# Patient Record
Sex: Male | Born: 1945
Health system: Southern US, Community
[De-identification: ages and names within clinical notes are randomized; demographics above are authoritative.]

## PROBLEM LIST (undated history)

## (undated) DIAGNOSIS — Z8673 Personal history of transient ischemic attack (TIA), and cerebral infarction without residual deficits: Secondary | ICD-10-CM

## (undated) DIAGNOSIS — R0609 Other forms of dyspnea: Secondary | ICD-10-CM

## (undated) DIAGNOSIS — I441 Atrioventricular block, second degree: Secondary | ICD-10-CM

## (undated) DIAGNOSIS — I471 Supraventricular tachycardia: Secondary | ICD-10-CM

## (undated) DIAGNOSIS — I219 Acute myocardial infarction, unspecified: Secondary | ICD-10-CM

## (undated) DIAGNOSIS — I10 Essential (primary) hypertension: Secondary | ICD-10-CM

## (undated) DIAGNOSIS — Z9359 Other cystostomy status: Secondary | ICD-10-CM

## (undated) DIAGNOSIS — I739 Peripheral vascular disease, unspecified: Secondary | ICD-10-CM

## (undated) DIAGNOSIS — R030 Elevated blood-pressure reading, without diagnosis of hypertension: Secondary | ICD-10-CM

## (undated) DIAGNOSIS — Z973 Presence of spectacles and contact lenses: Secondary | ICD-10-CM

## (undated) DIAGNOSIS — I4891 Unspecified atrial fibrillation: Secondary | ICD-10-CM

## (undated) DIAGNOSIS — I4719 Other supraventricular tachycardia: Secondary | ICD-10-CM

## (undated) DIAGNOSIS — I639 Cerebral infarction, unspecified: Secondary | ICD-10-CM

## (undated) DIAGNOSIS — J449 Chronic obstructive pulmonary disease, unspecified: Secondary | ICD-10-CM

## (undated) DIAGNOSIS — R0600 Dyspnea, unspecified: Secondary | ICD-10-CM

## (undated) DIAGNOSIS — Z87448 Personal history of other diseases of urinary system: Secondary | ICD-10-CM

## (undated) DIAGNOSIS — Z960 Presence of urogenital implants: Secondary | ICD-10-CM

## (undated) DIAGNOSIS — M1990 Unspecified osteoarthritis, unspecified site: Secondary | ICD-10-CM

## (undated) DIAGNOSIS — N210 Calculus in bladder: Secondary | ICD-10-CM

## (undated) DIAGNOSIS — Z87898 Personal history of other specified conditions: Secondary | ICD-10-CM

## (undated) HISTORY — PX: TONSILLECTOMY: SUR1361

---
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
## 1998-11-05 HISTORY — PX: TOTAL HIP ARTHROPLASTY: SHX124

## 2000-02-19 ENCOUNTER — Encounter: Payer: Self-pay | Admitting: Emergency Medicine

## 2000-02-19 ENCOUNTER — Emergency Department (HOSPITAL_COMMUNITY): Admission: EM | Admit: 2000-02-19 | Discharge: 2000-02-19 | Payer: Self-pay | Admitting: Emergency Medicine

## 2000-08-22 ENCOUNTER — Emergency Department (HOSPITAL_COMMUNITY): Admission: EM | Admit: 2000-08-22 | Discharge: 2000-08-22 | Payer: Self-pay | Admitting: Emergency Medicine

## 2000-08-22 ENCOUNTER — Encounter: Payer: Self-pay | Admitting: Emergency Medicine

## 2001-02-28 ENCOUNTER — Ambulatory Visit (HOSPITAL_COMMUNITY): Admission: RE | Admit: 2001-02-28 | Discharge: 2001-02-28 | Payer: Self-pay | Admitting: Internal Medicine

## 2001-02-28 ENCOUNTER — Encounter: Admission: RE | Admit: 2001-02-28 | Discharge: 2001-02-28 | Payer: Self-pay | Admitting: Internal Medicine

## 2001-02-28 ENCOUNTER — Encounter: Payer: Self-pay | Admitting: Internal Medicine

## 2001-03-06 ENCOUNTER — Encounter: Admission: RE | Admit: 2001-03-06 | Discharge: 2001-03-06 | Payer: Self-pay | Admitting: Internal Medicine

## 2001-10-15 ENCOUNTER — Ambulatory Visit (HOSPITAL_COMMUNITY): Admission: RE | Admit: 2001-10-15 | Discharge: 2001-10-15 | Payer: Self-pay | Admitting: *Deleted

## 2001-10-15 ENCOUNTER — Encounter: Payer: Self-pay | Admitting: *Deleted

## 2001-11-20 ENCOUNTER — Ambulatory Visit (HOSPITAL_COMMUNITY): Admission: RE | Admit: 2001-11-20 | Discharge: 2001-11-20 | Payer: Self-pay | Admitting: *Deleted

## 2001-11-20 ENCOUNTER — Encounter: Payer: Self-pay | Admitting: *Deleted

## 2001-11-24 ENCOUNTER — Encounter: Admission: RE | Admit: 2001-11-24 | Discharge: 2001-11-24 | Payer: Self-pay

## 2001-12-18 ENCOUNTER — Inpatient Hospital Stay (HOSPITAL_COMMUNITY): Admission: RE | Admit: 2001-12-18 | Discharge: 2001-12-24 | Payer: Self-pay | Admitting: *Deleted

## 2002-10-22 ENCOUNTER — Emergency Department (HOSPITAL_COMMUNITY): Admission: EM | Admit: 2002-10-22 | Discharge: 2002-10-22 | Payer: Self-pay | Admitting: Emergency Medicine

## 2002-11-25 ENCOUNTER — Encounter: Payer: Self-pay | Admitting: General Surgery

## 2002-11-25 ENCOUNTER — Ambulatory Visit (HOSPITAL_COMMUNITY): Admission: RE | Admit: 2002-11-25 | Discharge: 2002-11-25 | Payer: Self-pay | Admitting: General Surgery

## 2004-03-26 ENCOUNTER — Emergency Department (HOSPITAL_COMMUNITY): Admission: EM | Admit: 2004-03-26 | Discharge: 2004-03-26 | Payer: Self-pay | Admitting: Family Medicine

## 2005-07-06 ENCOUNTER — Encounter: Admission: RE | Admit: 2005-07-06 | Discharge: 2005-07-06 | Payer: Self-pay | Admitting: Cardiology

## 2005-10-02 ENCOUNTER — Ambulatory Visit (HOSPITAL_BASED_OUTPATIENT_CLINIC_OR_DEPARTMENT_OTHER): Admission: RE | Admit: 2005-10-02 | Discharge: 2005-10-02 | Payer: Self-pay | Admitting: General Surgery

## 2005-10-02 ENCOUNTER — Ambulatory Visit (HOSPITAL_COMMUNITY): Admission: RE | Admit: 2005-10-02 | Discharge: 2005-10-02 | Payer: Self-pay | Admitting: General Surgery

## 2009-11-05 HISTORY — PX: INGUINAL HERNIA REPAIR: SUR1180

## 2012-01-11 ENCOUNTER — Emergency Department (HOSPITAL_COMMUNITY)
Admission: EM | Admit: 2012-01-11 | Discharge: 2012-01-11 | Disposition: A | Discharge status: Home / Self Care | Payer: Medicare Other | Attending: Emergency Medicine | Admitting: Emergency Medicine

## 2012-01-11 ENCOUNTER — Encounter (HOSPITAL_COMMUNITY): Payer: Self-pay | Admitting: Family Medicine

## 2012-01-11 ENCOUNTER — Emergency Department (HOSPITAL_COMMUNITY): Payer: Medicare Other

## 2012-01-11 DIAGNOSIS — I10 Essential (primary) hypertension: Secondary | ICD-10-CM

## 2012-01-11 DIAGNOSIS — IMO0002 Reserved for concepts with insufficient information to code with codable children: Secondary | ICD-10-CM

## 2012-01-11 DIAGNOSIS — R51 Headache: Secondary | ICD-10-CM | POA: Insufficient documentation

## 2012-01-11 DIAGNOSIS — S0180XA Unspecified open wound of other part of head, initial encounter: Secondary | ICD-10-CM | POA: Insufficient documentation

## 2012-01-11 MED ORDER — HYDROCODONE-ACETAMINOPHEN 5-325 MG PO TABS: 2.0000 | ORAL_TABLET | ORAL | Status: AC | PRN

## 2012-01-11 MED ORDER — OXYCODONE-ACETAMINOPHEN 5-325 MG PO TABS
1.0000 | ORAL_TABLET | Freq: Once | ORAL | Status: AC
  Administered 2012-01-11: 1 via ORAL
  Filled 2012-01-11: qty 1
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
## 2012-01-11 NOTE — ED Notes (Signed)
Pt returned from CT °
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
## 2012-01-11 NOTE — ED Notes (Signed)
Dr. Radford Pax at bedside to assess patient.  Laceration to head bleeding significantly and closed with staples. Bleeding controlled after closure. NAD noted. [employer] remain at bedside to question patient.

## 2012-01-11 NOTE — ED Notes (Signed)
Patient transported to CT 

## 2012-01-21 ENCOUNTER — Encounter (HOSPITAL_COMMUNITY): Payer: Self-pay | Admitting: *Deleted

## 2012-01-21 ENCOUNTER — Emergency Department (HOSPITAL_COMMUNITY)
Admission: EM | Admit: 2012-01-21 | Discharge: 2012-01-21 | Disposition: A | Discharge status: Home / Self Care | Payer: Medicare Other | Attending: Emergency Medicine | Admitting: Emergency Medicine

## 2012-01-21 DIAGNOSIS — Z4802 Encounter for removal of sutures: Secondary | ICD-10-CM | POA: Insufficient documentation

## 2012-01-21 NOTE — Discharge Instructions (Signed)
Staple Removal  Care After  The staples used to close your skin have been removed. The wound needs continued care so it can heal completely and without problems. The care described here will need to be done for another 5-10 days unless your caregiver advises otherwise.   HOME CARE INSTRUCTIONS    Keep wound site dry and clean.   If skin adhesive strips were applied after the staples were removed, they will begin to peel off in a few days. If they remain after fourteen days, they may be peeled off and discarded.   If you still have a dressing, change it at least once a day or as instructed by your caregiver. If the bandage sticks, soak it off with warm water. Pat dry with a clean towel. Look for signs of infection (see below).   Reapply cream or ointment according to your caregiver's instruction. This will help prevent infection and keep the bandage from sticking. Use of a non-stick material over the wound and under the dressing or wrap will also help keep the bandage from sticking.   If the bandage becomes wet, dirty or develops a foul smell, change it as soon as possible.   New scars become sunburned easily. Use sunscreens with protection factor (SPF) of at least 15 when out in the sun.   Only take over-the-counter or prescription medicines for pain, discomfort or fever as directed by your caregiver.  SEEK IMMEDIATE MEDICAL CARE IF:    There is redness, swelling or increasing pain in the wound.   Pus is coming from the wound.   An unexplained oral temperature above 102 F (38.9 C) develops.   You notice a foul smell coming from the wound or dressing.   There is a breaking open of the suture line (edges not staying together) of the wound edges after staples have been removed.  Document Released: 10/04/2008 Document Revised: 10/11/2011 Document Reviewed: 10/04/2008  [employer] Patient Information 2012 [employer].

## 2012-01-21 NOTE — ED Notes (Signed)
Pt states he is her to have his staples removed on the right side of his head.
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
## 2012-01-23 NOTE — ED Provider Notes (Signed)
Medical screening examination/treatment/procedure(s) were conducted as a shared visit with non-physician practitioner(s) and myself.  I personally evaluated the patient during the encounter  Toy Baker, MD 01/23/12 [phone]

## 2013-06-18 ENCOUNTER — Encounter (HOSPITAL_COMMUNITY): Payer: Self-pay | Admitting: *Deleted

## 2013-06-18 ENCOUNTER — Emergency Department (HOSPITAL_COMMUNITY)
Admission: EM | Admit: 2013-06-18 | Discharge: 2013-06-18 | Disposition: A | Discharge status: Home / Self Care | Payer: Medicare Other | Attending: Emergency Medicine | Admitting: Emergency Medicine

## 2013-06-18 DIAGNOSIS — F17200 Nicotine dependence, unspecified, uncomplicated: Secondary | ICD-10-CM | POA: Insufficient documentation

## 2013-06-18 DIAGNOSIS — R300 Dysuria: Secondary | ICD-10-CM | POA: Insufficient documentation

## 2013-06-18 DIAGNOSIS — Z8719 Personal history of other diseases of the digestive system: Secondary | ICD-10-CM | POA: Insufficient documentation

## 2013-06-18 DIAGNOSIS — N419 Inflammatory disease of prostate, unspecified: Secondary | ICD-10-CM | POA: Insufficient documentation

## 2013-06-18 DIAGNOSIS — R350 Frequency of micturition: Secondary | ICD-10-CM | POA: Insufficient documentation

## 2013-06-18 LAB — URINE MICROSCOPIC-ADD ON

## 2013-06-18 LAB — URINALYSIS, ROUTINE W REFLEX MICROSCOPIC
Glucose, UA: NEGATIVE mg/dL
Nitrite: POSITIVE — AB
Protein, ur: >300 mg/dL — AB
Urobilinogen, UA: 1 mg/dL (ref 0.0–1.0)

## 2013-06-18 MED ORDER — PHENAZOPYRIDINE HCL 200 MG PO TABS: 200.0000 mg | ORAL_TABLET | Freq: Three times a day (TID) | ORAL | Status: DC

## 2013-06-18 MED ORDER — HYDROCODONE-ACETAMINOPHEN 5-325 MG PO TABS
1.0000 | ORAL_TABLET | ORAL | Status: DC | PRN
Start: 2013-06-18 — End: 2014-03-20

## 2013-06-18 MED ORDER — SULFAMETHOXAZOLE-TRIMETHOPRIM 800-160 MG PO TABS: 1.0000 | ORAL_TABLET | Freq: Two times a day (BID) | ORAL | Status: DC

## 2013-06-18 NOTE — ED Notes (Signed)
To ED for eval of possible UTI. freq urination. Painful urination. Bilateral flank pain. States these symptoms started about 2 months ago, but are intermittent. Skin w/d, resp e/u. Lower abd pain.

## 2013-06-18 NOTE — ED Provider Notes (Signed)
  CSN: [id number]     Arrival date & time 06/18/13  1256 History     First MD Initiated Contact with Patient 06/18/13 1436     Chief Complaint  Patient presents with  . Flank Pain  . Urinary Frequency   (Consider location/radiation/quality/duration/timing/severity/associated sxs/prior Treatment) Patient is a 67 y.o. male presenting with flank pain and frequency. The history is provided by the patient. No language interpreter was used.  Flank Pain This is a new problem. The current episode started more than 1 month ago. Associated symptoms include urinary symptoms. Pertinent negatives include no abdominal pain, fever, nausea or vomiting. Associated symptoms comments: Dysuria with some dripping of urine after he voids on and off for the past 2 months. No fever. He reports the discomfort with urination is worse now. No N, V. No history of prostate problems. .  Urinary Frequency Associated symptoms include urinary symptoms. Pertinent negatives include no abdominal pain, fever, nausea or vomiting.    Past Medical History  Diagnosis Date  . Hernia    Past Surgical History  Procedure Laterality Date  . Joint replacement    . Hernia repair     No family history on file. History  Substance Use Topics  . Smoking status: Current Every Day Smoker -- 0.50 packs/day  . Smokeless tobacco: Not on file  . Alcohol Use: Yes     Comment: 2-3 days a week, wine    Review of Systems  Constitutional: Negative for fever.  Gastrointestinal: Negative for nausea, vomiting and abdominal pain.  Genitourinary: Positive for dysuria, frequency and flank pain. Negative for testicular pain.    Allergies  Review of patient's allergies indicates no known allergies.  Home Medications  No current outpatient prescriptions on file. BP 176/124  Pulse 101  Temp(Src) 97.7 F (36.5 C) (Oral)  Resp 18  SpO2 97% Physical Exam  Constitutional: He is oriented to person, place, and time. He appears well-developed  and well-nourished.  Neck: Normal range of motion.  Pulmonary/Chest: Effort normal.  Abdominal: Soft. He exhibits no distension. There is tenderness.  Suprapubic tenderness without guarding. No bladder distention.  Genitourinary: Penis normal.  No penile discharge from uncircumcised penis. No redness or swelling. Prostate minimally tender, moderately enlarged.  Musculoskeletal: Normal range of motion.  Neurological: He is alert and oriented to person, place, and time.  Skin: Skin is warm and dry.  Psychiatric: He has a normal mood and affect.    ED Course   Procedures (including critical care time)  Labs Reviewed  URINALYSIS, ROUTINE W REFLEX MICROSCOPIC - Abnormal; Notable for the following:    APPearance TURBID (*)    Hgb urine dipstick LARGE (*)    Protein, ur >300 (*)    Nitrite POSITIVE (*)    Leukocytes, UA MODERATE (*)    All other components within normal limits  URINE MICROSCOPIC-ADD ON - Abnormal; Notable for the following:    Bacteria, UA MANY (*)    Crystals TRIPLE PHOSPHATE CRYSTALS (*)    All other components within normal limits  URINE CULTURE   No results found. No diagnosis found. 1. Prostatitis  MDM  Patient is alert and comfortable, oriented and non-toxic. Will treat for prostatitis and refer to urology for follow up.  Arnoldo Hooker, PA-C 06/18/13 1527

## 2013-06-19 LAB — URINE CULTURE
Colony Count: NO GROWTH
Culture: NO GROWTH

## 2013-06-26 NOTE — ED Provider Notes (Signed)
Medical screening examination/treatment/procedure(s) were performed by non-physician practitioner and as supervising physician I was immediately available for consultation/collaboration.  Shaivi Rothschild T Alia Parsley, MD 06/26/13 1121 

## 2014-03-20 ENCOUNTER — Encounter (HOSPITAL_COMMUNITY): Payer: Self-pay | Admitting: Emergency Medicine

## 2014-03-20 ENCOUNTER — Emergency Department (HOSPITAL_COMMUNITY): Payer: Medicare Other

## 2014-03-20 ENCOUNTER — Encounter (HOSPITAL_COMMUNITY): Payer: Medicare Other | Admitting: Anesthesiology

## 2014-03-20 ENCOUNTER — Encounter (HOSPITAL_COMMUNITY)
Admission: EM | Disposition: A | Discharge status: Home Health | Payer: Self-pay | Source: Home / Self Care | Attending: Family Medicine

## 2014-03-20 ENCOUNTER — Inpatient Hospital Stay (HOSPITAL_COMMUNITY)
Admission: EM | Admit: 2014-03-20 | Discharge: 2014-03-22 | DRG: 511 | Disposition: A | Discharge status: Home Health | Payer: Medicare Other | Attending: Family Medicine | Admitting: Family Medicine

## 2014-03-20 ENCOUNTER — Inpatient Hospital Stay (HOSPITAL_COMMUNITY): Payer: Medicare Other | Admitting: Anesthesiology

## 2014-03-20 ENCOUNTER — Inpatient Hospital Stay (HOSPITAL_COMMUNITY): Payer: Medicare Other

## 2014-03-20 DIAGNOSIS — S022XXA Fracture of nasal bones, initial encounter for closed fracture: Secondary | ICD-10-CM

## 2014-03-20 DIAGNOSIS — F10929 Alcohol use, unspecified with intoxication, unspecified: Secondary | ICD-10-CM

## 2014-03-20 DIAGNOSIS — N329 Bladder disorder, unspecified: Secondary | ICD-10-CM | POA: Diagnosis present

## 2014-03-20 DIAGNOSIS — R143 Flatulence: Secondary | ICD-10-CM

## 2014-03-20 DIAGNOSIS — I10 Essential (primary) hypertension: Secondary | ICD-10-CM | POA: Diagnosis present

## 2014-03-20 DIAGNOSIS — N390 Urinary tract infection, site not specified: Secondary | ICD-10-CM | POA: Diagnosis present

## 2014-03-20 DIAGNOSIS — F1020 Alcohol dependence, uncomplicated: Secondary | ICD-10-CM | POA: Diagnosis present

## 2014-03-20 DIAGNOSIS — R142 Eructation: Secondary | ICD-10-CM

## 2014-03-20 DIAGNOSIS — S52609A Unspecified fracture of lower end of unspecified ulna, initial encounter for closed fracture: Secondary | ICD-10-CM

## 2014-03-20 DIAGNOSIS — S62102A Fracture of unspecified carpal bone, left wrist, initial encounter for closed fracture: Secondary | ICD-10-CM

## 2014-03-20 DIAGNOSIS — M1990 Unspecified osteoarthritis, unspecified site: Secondary | ICD-10-CM | POA: Diagnosis present

## 2014-03-20 DIAGNOSIS — S62113A Displaced fracture of triquetrum [cuneiform] bone, unspecified wrist, initial encounter for closed fracture: Secondary | ICD-10-CM | POA: Diagnosis not present

## 2014-03-20 DIAGNOSIS — J4489 Other specified chronic obstructive pulmonary disease: Secondary | ICD-10-CM | POA: Diagnosis present

## 2014-03-20 DIAGNOSIS — F17200 Nicotine dependence, unspecified, uncomplicated: Secondary | ICD-10-CM | POA: Diagnosis present

## 2014-03-20 DIAGNOSIS — S52509A Unspecified fracture of the lower end of unspecified radius, initial encounter for closed fracture: Secondary | ICD-10-CM | POA: Diagnosis present

## 2014-03-20 DIAGNOSIS — J449 Chronic obstructive pulmonary disease, unspecified: Secondary | ICD-10-CM | POA: Diagnosis present

## 2014-03-20 DIAGNOSIS — W19XXXA Unspecified fall, initial encounter: Secondary | ICD-10-CM

## 2014-03-20 DIAGNOSIS — Z23 Encounter for immunization: Secondary | ICD-10-CM

## 2014-03-20 DIAGNOSIS — R141 Gas pain: Secondary | ICD-10-CM | POA: Diagnosis present

## 2014-03-20 DIAGNOSIS — W108XXA Fall (on) (from) other stairs and steps, initial encounter: Secondary | ICD-10-CM | POA: Diagnosis present

## 2014-03-20 DIAGNOSIS — S5292XA Unspecified fracture of left forearm, initial encounter for closed fracture: Secondary | ICD-10-CM | POA: Diagnosis present

## 2014-03-20 DIAGNOSIS — F1010 Alcohol abuse, uncomplicated: Secondary | ICD-10-CM | POA: Diagnosis not present

## 2014-03-20 DIAGNOSIS — F1410 Cocaine abuse, uncomplicated: Secondary | ICD-10-CM | POA: Diagnosis present

## 2014-03-20 DIAGNOSIS — IMO0002 Reserved for concepts with insufficient information to code with codable children: Secondary | ICD-10-CM | POA: Diagnosis present

## 2014-03-20 HISTORY — PX: ORIF WRIST FRACTURE: SHX2133

## 2014-03-20 LAB — URINE MICROSCOPIC-ADD ON

## 2014-03-20 LAB — RAPID URINE DRUG SCREEN, HOSP PERFORMED
Amphetamines: NOT DETECTED
BARBITURATES: NOT DETECTED
Benzodiazepines: NOT DETECTED
COCAINE: POSITIVE — AB
Opiates: NOT DETECTED
Tetrahydrocannabinol: NOT DETECTED

## 2014-03-20 LAB — URINALYSIS, ROUTINE W REFLEX MICROSCOPIC
BILIRUBIN URINE: NEGATIVE
Glucose, UA: NEGATIVE mg/dL
Hgb urine dipstick: NEGATIVE
Ketones, ur: NEGATIVE mg/dL
NITRITE: NEGATIVE
PH: 8 (ref 5.0–8.0)
Protein, ur: 30 mg/dL — AB
SPECIFIC GRAVITY, URINE: 1.012 (ref 1.005–1.030)
Urobilinogen, UA: 1 mg/dL (ref 0.0–1.0)

## 2014-03-20 LAB — CBC WITH DIFFERENTIAL/PLATELET
Basophils Absolute: 0.1 10*3/uL (ref 0.0–0.1)
Basophils Relative: 1 % (ref 0–1)
EOS PCT: 0 % (ref 0–5)
Eosinophils Absolute: 0 10*3/uL (ref 0.0–0.7)
HEMATOCRIT: 38.9 % — AB (ref 39.0–52.0)
HEMOGLOBIN: 13.3 g/dL (ref 13.0–17.0)
LYMPHS ABS: 0.9 10*3/uL (ref 0.7–4.0)
LYMPHS PCT: 9 % — AB (ref 12–46)
MCH: 33.9 pg (ref 26.0–34.0)
MCHC: 34.2 g/dL (ref 30.0–36.0)
MCV: 99.2 fL (ref 78.0–100.0)
MONO ABS: 0.9 10*3/uL (ref 0.1–1.0)
MONOS PCT: 9 % (ref 3–12)
Neutro Abs: 7.6 10*3/uL (ref 1.7–7.7)
Neutrophils Relative %: 81 % — ABNORMAL HIGH (ref 43–77)
PLATELETS: 266 10*3/uL (ref 150–400)
RBC: 3.92 MIL/uL — AB (ref 4.22–5.81)
RDW: 13.6 % (ref 11.5–15.5)
WBC: 9.4 10*3/uL (ref 4.0–10.5)

## 2014-03-20 LAB — COMPREHENSIVE METABOLIC PANEL
ALT: 10 U/L (ref 0–53)
AST: 28 U/L (ref 0–37)
Albumin: 4.3 g/dL (ref 3.5–5.2)
Alkaline Phosphatase: 88 U/L (ref 39–117)
BUN: 6 mg/dL (ref 6–23)
CALCIUM: 9.9 mg/dL (ref 8.4–10.5)
CO2: 20 meq/L (ref 19–32)
Chloride: 92 mEq/L — ABNORMAL LOW (ref 96–112)
Creatinine, Ser: 0.86 mg/dL (ref 0.50–1.35)
GFR, EST NON AFRICAN AMERICAN: 88 mL/min — AB (ref >90)
GLUCOSE: 108 mg/dL — AB (ref 70–99)
Potassium: 3.3 mEq/L — ABNORMAL LOW (ref 3.7–5.3)
SODIUM: 135 meq/L — AB (ref 137–147)
Total Bilirubin: 0.8 mg/dL (ref 0.3–1.2)
Total Protein: 8.6 g/dL — ABNORMAL HIGH (ref 6.0–8.3)

## 2014-03-20 LAB — ETHANOL: Alcohol, Ethyl (B): 136 mg/dL — ABNORMAL HIGH (ref 0–11)

## 2014-03-20 LAB — PROTIME-INR
INR: 0.98 (ref 0.00–1.49)
Prothrombin Time: 12.8 seconds (ref 11.6–15.2)

## 2014-03-20 SURGERY — OPEN REDUCTION INTERNAL FIXATION (ORIF) WRIST FRACTURE
Anesthesia: Monitor Anesthesia Care | Site: Wrist | Laterality: Left

## 2014-03-20 MED ORDER — HYDROMORPHONE HCL PF 2 MG/ML IJ SOLN
2.0000 mg | Freq: Once | INTRAMUSCULAR | Status: AC
  Administered 2014-03-20: 2 mg via INTRAVENOUS

## 2014-03-20 MED ORDER — LIDOCAINE HCL (PF) 2 % IJ SOLN
INTRAMUSCULAR | Status: DC | PRN
  Administered 2014-03-20: 8 mL via PERINEURAL

## 2014-03-20 MED ORDER — LORAZEPAM 2 MG/ML IJ SOLN: 1.0000 mg | Freq: Four times a day (QID) | INTRAMUSCULAR | Status: DC | PRN

## 2014-03-20 MED ORDER — HYDROMORPHONE HCL PF 2 MG/ML IJ SOLN
INTRAMUSCULAR | Status: AC
  Administered 2014-03-20: 2 mg via INTRAVENOUS
  Filled 2014-03-20: qty 1

## 2014-03-20 MED ORDER — LABETALOL HCL 5 MG/ML IV SOLN
20.0000 mg | Freq: Once | INTRAVENOUS | Status: AC
  Administered 2014-03-20: 20 mg via INTRAVENOUS
  Filled 2014-03-20: qty 4

## 2014-03-20 MED ORDER — ACETAMINOPHEN 160 MG/5ML PO SOLN: 325.0000 mg | ORAL | Status: DC | PRN

## 2014-03-20 MED ORDER — ACETAMINOPHEN 325 MG PO TABS: 325.0000 mg | ORAL_TABLET | ORAL | Status: DC | PRN

## 2014-03-20 MED ORDER — ADULT MULTIVITAMIN W/MINERALS CH
1.0000 | ORAL_TABLET | Freq: Every day | ORAL | Status: DC
  Administered 2014-03-20 – 2014-03-22 (×3): 1 via ORAL
  Filled 2014-03-20 (×3): qty 1

## 2014-03-20 MED ORDER — THIAMINE HCL 100 MG/ML IJ SOLN
100.0000 mg | Freq: Every day | INTRAMUSCULAR | Status: DC
  Filled 2014-03-20 (×3): qty 1

## 2014-03-20 MED ORDER — TETANUS-DIPHTH-ACELL PERTUSSIS 5-2.5-18.5 LF-MCG/0.5 IM SUSP
0.5000 mL | Freq: Once | INTRAMUSCULAR | Status: AC
  Administered 2014-03-20: 0.5 mL via INTRAMUSCULAR
  Filled 2014-03-20: qty 0.5

## 2014-03-20 MED ORDER — SODIUM CHLORIDE 0.9 % IV SOLN: INTRAVENOUS | Status: DC

## 2014-03-20 MED ORDER — METHOCARBAMOL 1000 MG/10ML IJ SOLN
500.0000 mg | Freq: Four times a day (QID) | INTRAVENOUS | Status: DC | PRN
  Filled 2014-03-20: qty 5

## 2014-03-20 MED ORDER — VITAMIN B-1 100 MG PO TABS
100.0000 mg | ORAL_TABLET | Freq: Every day | ORAL | Status: DC
  Administered 2014-03-20 – 2014-03-22 (×3): 100 mg via ORAL
  Filled 2014-03-20 (×3): qty 1

## 2014-03-20 MED ORDER — ROPIVACAINE HCL 5 MG/ML IJ SOLN
INTRAMUSCULAR | Status: DC | PRN
  Administered 2014-03-20: 17 mL via PERINEURAL

## 2014-03-20 MED ORDER — SODIUM CHLORIDE 0.9 % IJ SOLN
3.0000 mL | Freq: Two times a day (BID) | INTRAMUSCULAR | Status: DC
  Administered 2014-03-20 – 2014-03-22 (×3): 3 mL via INTRAVENOUS

## 2014-03-20 MED ORDER — IOHEXOL 300 MG/ML  SOLN
100.0000 mL | Freq: Once | INTRAMUSCULAR | Status: AC | PRN
  Administered 2014-03-20: 100 mL via INTRAVENOUS

## 2014-03-20 MED ORDER — FOLIC ACID 1 MG PO TABS
1.0000 mg | ORAL_TABLET | Freq: Every day | ORAL | Status: DC
  Administered 2014-03-20 – 2014-03-22 (×3): 1 mg via ORAL
  Filled 2014-03-20 (×3): qty 1

## 2014-03-20 MED ORDER — HYDRALAZINE HCL 20 MG/ML IJ SOLN
10.0000 mg | Freq: Four times a day (QID) | INTRAMUSCULAR | Status: DC | PRN
  Administered 2014-03-21: 10 mg via INTRAVENOUS
  Filled 2014-03-20: qty 1

## 2014-03-20 MED ORDER — LABETALOL HCL 5 MG/ML IV SOLN: 20.0000 mg | Freq: Once | INTRAVENOUS | Status: DC

## 2014-03-20 MED ORDER — SODIUM CHLORIDE 0.9 % IV SOLN
INTRAVENOUS | Status: DC | PRN
  Administered 2014-03-20: 21:00:00 via INTRAVENOUS

## 2014-03-20 MED ORDER — MORPHINE SULFATE 2 MG/ML IJ SOLN
2.0000 mg | INTRAMUSCULAR | Status: DC | PRN
  Administered 2014-03-21 (×2): 2 mg via INTRAVENOUS
  Filled 2014-03-20 (×2): qty 1

## 2014-03-20 MED ORDER — ONDANSETRON HCL 4 MG PO TABS
4.0000 mg | ORAL_TABLET | Freq: Four times a day (QID) | ORAL | Status: DC | PRN
  Administered 2014-03-20: 4 mg via ORAL

## 2014-03-20 MED ORDER — FENTANYL CITRATE 0.05 MG/ML IJ SOLN: 25.0000 ug | INTRAMUSCULAR | Status: DC | PRN

## 2014-03-20 MED ORDER — HYDROMORPHONE HCL PF 1 MG/ML IJ SOLN
1.0000 mg | Freq: Once | INTRAMUSCULAR | Status: AC
  Administered 2014-03-20: 1 mg via INTRAVENOUS
  Filled 2014-03-20: qty 1

## 2014-03-20 MED ORDER — SODIUM CHLORIDE 0.9 % IV BOLUS (SEPSIS)
1000.0000 mL | Freq: Once | INTRAVENOUS | Status: AC
  Administered 2014-03-20: 1000 mL via INTRAVENOUS

## 2014-03-20 MED ORDER — LORAZEPAM 1 MG PO TABS: 1.0000 mg | ORAL_TABLET | Freq: Four times a day (QID) | ORAL | Status: DC | PRN

## 2014-03-20 MED ORDER — LABETALOL HCL 5 MG/ML IV SOLN
INTRAVENOUS | Status: AC
  Administered 2014-03-20: 5 mg via INTRAVENOUS
  Filled 2014-03-20: qty 4

## 2014-03-20 MED ORDER — ONDANSETRON HCL 4 MG/2ML IJ SOLN
4.0000 mg | Freq: Once | INTRAMUSCULAR | Status: AC
  Administered 2014-03-20: 4 mg via INTRAVENOUS
  Filled 2014-03-20: qty 2

## 2014-03-20 MED ORDER — LABETALOL HCL 5 MG/ML IV SOLN
INTRAVENOUS | Status: DC | PRN
  Administered 2014-03-20 (×4): 10 mg via INTRAVENOUS

## 2014-03-20 MED ORDER — HYDRALAZINE HCL 20 MG/ML IJ SOLN: 10.0000 mg | Freq: Once | INTRAMUSCULAR | Status: DC

## 2014-03-20 MED ORDER — DEXTROSE 5 % IV SOLN
1.0000 g | INTRAVENOUS | Status: DC
  Administered 2014-03-21 – 2014-03-22 (×2): 1 g via INTRAVENOUS
  Filled 2014-03-20 (×2): qty 10

## 2014-03-20 MED ORDER — POLYETHYLENE GLYCOL 3350 17 G PO PACK
17.0000 g | PACK | Freq: Every day | ORAL | Status: DC | PRN
  Filled 2014-03-20: qty 1

## 2014-03-20 MED ORDER — LABETALOL HCL 5 MG/ML IV SOLN
INTRAVENOUS | Status: AC
  Filled 2014-03-20: qty 8

## 2014-03-20 MED ORDER — LABETALOL HCL 5 MG/ML IV SOLN
5.0000 mg | INTRAVENOUS | Status: DC | PRN
  Administered 2014-03-20: 5 mg via INTRAVENOUS

## 2014-03-20 MED ORDER — CEFAZOLIN SODIUM 1-5 GM-% IV SOLN
1.0000 g | INTRAVENOUS | Status: AC
  Administered 2014-03-20: 1 g via INTRAVENOUS

## 2014-03-20 MED ORDER — 0.9 % SODIUM CHLORIDE (POUR BTL) OPTIME
TOPICAL | Status: DC | PRN
  Administered 2014-03-20: 1000 mL

## 2014-03-20 MED ORDER — BACITRACIN ZINC 500 UNIT/GM EX OINT
TOPICAL_OINTMENT | CUTANEOUS | Status: AC
  Filled 2014-03-20: qty 15

## 2014-03-20 MED ORDER — ONDANSETRON HCL 4 MG/2ML IJ SOLN
4.0000 mg | Freq: Four times a day (QID) | INTRAMUSCULAR | Status: DC | PRN
  Filled 2014-03-20: qty 2

## 2014-03-20 MED ORDER — CEFAZOLIN SODIUM-DEXTROSE 2-3 GM-% IV SOLR
INTRAVENOUS | Status: DC | PRN
  Administered 2014-03-20: 2 g via INTRAVENOUS

## 2014-03-20 MED ORDER — ONDANSETRON HCL 4 MG/2ML IJ SOLN: 4.0000 mg | Freq: Once | INTRAMUSCULAR | Status: AC | PRN

## 2014-03-20 MED ORDER — OXYCODONE HCL 5 MG PO TABS: 5.0000 mg | ORAL_TABLET | Freq: Once | ORAL | Status: AC | PRN

## 2014-03-20 MED ORDER — LACTATED RINGERS IV SOLN: INTRAVENOUS | Status: DC

## 2014-03-20 MED ORDER — OXYCODONE HCL 5 MG/5ML PO SOLN: 5.0000 mg | Freq: Once | ORAL | Status: AC | PRN

## 2014-03-20 MED ORDER — LORAZEPAM 2 MG/ML IJ SOLN
1.0000 mg | Freq: Once | INTRAMUSCULAR | Status: DC
  Filled 2014-03-20: qty 1

## 2014-03-20 MED ORDER — SODIUM CHLORIDE 0.9 % IV BOLUS (SEPSIS)
500.0000 mL | Freq: Once | INTRAVENOUS | Status: AC
  Administered 2014-03-20: 500 mL via INTRAVENOUS

## 2014-03-20 MED ORDER — CEFAZOLIN SODIUM 1-5 GM-% IV SOLN
INTRAVENOUS | Status: AC
Start: 2014-03-20 — End: 2014-03-21
  Filled 2014-03-20: qty 50

## 2014-03-20 MED ORDER — DEXTROSE 5 % IV SOLN
1.0000 g | Freq: Once | INTRAVENOUS | Status: AC
  Administered 2014-03-20: 1 g via INTRAVENOUS
  Filled 2014-03-20: qty 10

## 2014-03-20 SURGICAL SUPPLY — 71 items
BANDAGE CONFORM 3  STR LF (GAUZE/BANDAGES/DRESSINGS) ×2 IMPLANT
BANDAGE ELASTIC 4 VELCRO ST LF (GAUZE/BANDAGES/DRESSINGS) ×3 IMPLANT
BLADE 10 SAFETY STRL DISP (BLADE) ×3 IMPLANT
BLADE SURG ROTATE 9660 (MISCELLANEOUS) IMPLANT
BNDG CMPR 9X4 STRL LF SNTH (GAUZE/BANDAGES/DRESSINGS) ×1
BNDG ESMARK 4X9 LF (GAUZE/BANDAGES/DRESSINGS) ×3 IMPLANT
CORDS BIPOLAR (ELECTRODE) ×3 IMPLANT
COVER SURGICAL LIGHT HANDLE (MISCELLANEOUS) ×3 IMPLANT
CUFF TOURNIQUET SINGLE 18IN (TOURNIQUET CUFF) ×3 IMPLANT
CUFF TOURNIQUET SINGLE 24IN (TOURNIQUET CUFF) IMPLANT
DRAIN TLS ROUND 10FR (DRAIN) IMPLANT
DRAPE OEC MINIVIEW 54X84 (DRAPES) IMPLANT
DRAPE SURG 17X23 STRL (DRAPES) ×3 IMPLANT
DRSG ADAPTIC 3X8 NADH LF (GAUZE/BANDAGES/DRESSINGS) ×2 IMPLANT
GAUZE XEROFORM 1X8 LF (GAUZE/BANDAGES/DRESSINGS) ×3 IMPLANT
GAUZE XEROFORM 5X9 LF (GAUZE/BANDAGES/DRESSINGS) ×3 IMPLANT
GLOVE BIOGEL M STRL SZ7.5 (GLOVE) ×3 IMPLANT
GLOVE BIOGEL PI IND STRL 6.5 (GLOVE) IMPLANT
GLOVE BIOGEL PI IND STRL 7.5 (GLOVE) IMPLANT
GLOVE BIOGEL PI INDICATOR 6.5 (GLOVE) ×2
GLOVE BIOGEL PI INDICATOR 7.5 (GLOVE) ×2
GLOVE ECLIPSE 6.5 STRL STRAW (GLOVE) ×4 IMPLANT
GLOVE SS BIOGEL STRL SZ 8 (GLOVE) ×1 IMPLANT
GLOVE SUPERSENSE BIOGEL SZ 8 (GLOVE) ×2
GOWN STRL REUS W/ TWL LRG LVL3 (GOWN DISPOSABLE) ×3 IMPLANT
GOWN STRL REUS W/ TWL XL LVL3 (GOWN DISPOSABLE) ×3 IMPLANT
GOWN STRL REUS W/TWL LRG LVL3 (GOWN DISPOSABLE) ×9
GOWN STRL REUS W/TWL XL LVL3 (GOWN DISPOSABLE) ×9
KIT BASIN OR (CUSTOM PROCEDURE TRAY) ×3 IMPLANT
KIT ROOM TURNOVER OR (KITS) ×3 IMPLANT
LOOP VESSEL MAXI BLUE (MISCELLANEOUS) IMPLANT
MANIFOLD NEPTUNE II (INSTRUMENTS) ×3 IMPLANT
NEEDLE 22X1 1/2 (OR ONLY) (NEEDLE) IMPLANT
NS IRRIG 1000ML POUR BTL (IV SOLUTION) ×3 IMPLANT
PACK ORTHO EXTREMITY (CUSTOM PROCEDURE TRAY) ×3 IMPLANT
PAD ARMBOARD 7.5X6 YLW CONV (MISCELLANEOUS) ×6 IMPLANT
PAD CAST 3X4 CTTN HI CHSV (CAST SUPPLIES) ×1 IMPLANT
PAD CAST 4YDX4 CTTN HI CHSV (CAST SUPPLIES) ×1 IMPLANT
PADDING CAST COTTON 3X4 STRL (CAST SUPPLIES) ×3
PADDING CAST COTTON 4X4 STRL (CAST SUPPLIES) ×3
PEG LOCKING SMOOTH 2.2X18 (Peg) ×4 IMPLANT
PEG LOCKING SMOOTH 2.2X20 (Screw) ×2 IMPLANT
PEG LOCKING SMOOTH 2.2X22 (Screw) ×4 IMPLANT
PEG LOCKING SMOOTH 2.2X24 (Peg) ×3 IMPLANT
PLATE STANDARD DVR LEFT (Plate) ×3 IMPLANT
PLATE STD DVR LT 24X51 (Plate) IMPLANT
PUTTY DBM STAGRAFT PLUS 5CC (Putty) ×2 IMPLANT
SCREW LOCK 14X2.7X 3 LD TPR (Screw) IMPLANT
SCREW LOCK 18X2.7X 3 LD TPR (Screw) IMPLANT
SCREW LOCK 20X2.7X 3 LD TPR (Screw) ×1 IMPLANT
SCREW LOCK 22X2.7X 3 LD TPR (Screw) IMPLANT
SCREW LOCKING 2.7X14 (Screw) ×3 IMPLANT
SCREW LOCKING 2.7X18 (Screw) ×9 IMPLANT
SCREW LOCKING 2.7X20MM (Screw) ×3 IMPLANT
SCREW LOCKING 2.7X22MM (Screw) ×3 IMPLANT
SPONGE GAUZE 4X4 12PLY (GAUZE/BANDAGES/DRESSINGS) ×3 IMPLANT
SPONGE LAP 18X18 X RAY DECT (DISPOSABLE) ×2 IMPLANT
SPONGE LAP 4X18 X RAY DECT (DISPOSABLE) IMPLANT
SUT MNCRL AB 4-0 PS2 18 (SUTURE) ×3 IMPLANT
SUT PROLENE 3 0 PS 2 (SUTURE) ×3 IMPLANT
SUT PROLENE 4 0 P 3 18 (SUTURE) ×2 IMPLANT
SUT PROLENE 4 0 PS 2 18 (SUTURE) ×9 IMPLANT
SUT VIC AB 3-0 FS2 27 (SUTURE) ×3 IMPLANT
SYR CONTROL 10ML LL (SYRINGE) IMPLANT
SYSTEM CHEST DRAIN TLS 7FR (DRAIN) ×2 IMPLANT
TOWEL OR 17X24 6PK STRL BLUE (TOWEL DISPOSABLE) ×3 IMPLANT
TOWEL OR 17X26 10 PK STRL BLUE (TOWEL DISPOSABLE) ×3 IMPLANT
TUBE CONNECTING 12'X1/4 (SUCTIONS) ×1
TUBE CONNECTING 12X1/4 (SUCTIONS) ×2 IMPLANT
TUBE EVACUATION TLS (MISCELLANEOUS) ×3 IMPLANT
WATER STERILE IRR 1000ML POUR (IV SOLUTION) ×3 IMPLANT

## 2014-03-20 NOTE — Anesthesia Preprocedure Evaluation (Signed)
Anesthesia Evaluation  Patient identified by MRN, date of birth, ID band Patient awake    Reviewed: Allergy & Precautions, H&P , NPO status , Patient's Chart, lab work & pertinent test results  History of Anesthesia Complications Negative for: history of anesthetic complications  Airway Mallampati: II TM Distance: >3 FB Neck ROM: Full    Dental  (+) Poor Dentition   Pulmonary neg sleep apnea, neg COPDneg recent URI, Current Smoker,  breath sounds clear to auscultation        Cardiovascular hypertension, Rhythm:Regular     Neuro/Psych negative neurological ROS  negative psych ROS   GI/Hepatic negative GI ROS, Neg liver ROS,   Endo/Other  negative endocrine ROS  Renal/GU negative Renal ROS     Musculoskeletal   Abdominal   Peds  Hematology negative hematology ROS (+)   Anesthesia Other Findings positve ETOH and cocaine intoxication  Reproductive/Obstetrics                           Anesthesia Physical Anesthesia Plan  ASA: III  Anesthesia Plan: MAC and Regional   Post-op Pain Management:    Induction:   Airway Management Planned: Natural Airway and Simple Face Mask  Additional Equipment: None  Intra-op Plan:   Post-operative Plan:   Informed Consent: I have reviewed the patients History and Physical, chart, labs and discussed the procedure including the risks, benefits and alternatives for the proposed anesthesia with the patient or authorized representative who has indicated his/her understanding and acceptance.   Dental advisory given  Plan Discussed with: CRNA and Surgeon  Anesthesia Plan Comments:         Anesthesia Quick Evaluation
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
## 2014-03-20 NOTE — Transfer of Care (Signed)
Immediate Anesthesia Transfer of Care Note  Patient: Joe Durham  Procedure(s) Performed: Procedure(s): OPEN REDUCTION INTERNAL FIXATION (ORIF) WRIST FRACTURE (Left)  Patient Location: PACU  Anesthesia Type:MAC and Regional  Level of Consciousness: awake, oriented and patient cooperative  Airway & Oxygen Therapy: Patient Spontanous Breathing  Post-op Assessment: Report given to PACU RN, Post -op Vital signs reviewed and stable and Patient moving all extremities X 4  Post vital signs: Reviewed and stable  Complications: No apparent anesthesia complications

## 2014-03-20 NOTE — ED Notes (Signed)
[facility] has been contacted by unit secretary approx ago. [facility] sts pt is on the list for transport but there are no trucks available at this time.
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
## 2014-03-20 NOTE — ED Provider Notes (Signed)
Medical screening exam  Patient transferred from the ER at [facility] long for management of comminuted fracture left wrist by Doctor Amanda Pea, hand surgery. Reviewing the patient's records reveals that he has a history of hypertension, which is very uncontrolled today. He has COPD as well. This does not appear to be active, oxygenation is 100% upon arrival, lungs are clear. Patient has a history of chronic alcoholism and is at risk for withdrawal. Based on his multiple medical problems, it was felt that the patient will require admission to the medicine service rather than the hand surgeon's service. Case was discussed with family practice, on call for unassigned. Patient will be admitted to telemetry. He was given an additional dose of labetalol here after my evaluation for elevated blood pressures.  Physical Exam  Constitutional: He appears well-developed and well-nourished.  Eyes: EOM are normal. Pupils are equal, round, and reactive to light.  Neck: Normal range of motion.  Cardiovascular: Normal rate and regular rhythm.   Pulmonary/Chest: Effort normal and breath sounds normal.  Abdominal: Soft. There is no tenderness.  Musculoskeletal:  Obvious deformity left wrist, normal capillary refill, sensation distal to the fracture  Neurological:  Sleeping, easily arouses. Images questions appropriately and follows commands  Skin: Skin is warm and dry.  Sutures in place left cheek    Gilda Crease, MD 03/20/14 [phone]
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
## 2014-03-20 NOTE — ED Notes (Signed)
Bed: WA06 Expected date:  Expected time:  Means of arrival:  Comments: EMS fall/wrist fx

## 2014-03-20 NOTE — Anesthesia Procedure Notes (Addendum)
Procedure Name: MAC Date/Time: 03/20/2014 9:25 PM Performed by: Melina Schools Pre-anesthesia Checklist: Patient identified, Emergency Drugs available, Suction available and Patient being monitored Patient Re-evaluated:Patient Re-evaluated prior to inductionOxygen Delivery Method: Simple face mask    Anesthesia Regional Block:  Supraclavicular block  Pre-Anesthetic Checklist: ,, timeout performed, Correct Patient, Correct Site, Correct Laterality, Correct Procedure, Correct Position, site marked, Risks and benefits discussed,  Surgical consent,  Pre-op evaluation,  At surgeon's request and post-op pain management  Laterality: Upper and Left  Prep: chloraprep       Needles:  Injection technique: Single-shot  Needle Type: Echogenic Needle          Additional Needles:  Procedures: ultrasound guided (picture in chart) Supraclavicular block Narrative:  Start time: 03/20/2014 9:00 PM End time: 03/20/2014 9:07 PM Injection made incrementally with aspirations every 5 mL.  Performed by: Personally  Anesthesiologist: Willetta York  Additional Notes: H+P and labs reviewed, risks and benefits discussed with patient, procedure tolerated well without complications

## 2014-03-20 NOTE — ED Notes (Signed)
Pt d/c with [facility] to [facility] ED. Labetolol given prior to d/c at [facility] request. Ativan for nausea/anxiety held at [facility] request, reporting "pt is not very arouseable, can we not give the ativan?" Pt is A&O x4, GCS 15. Ativan still held at request.

## 2014-03-20 NOTE — Op Note (Signed)
See dictation [phone] Kimber Esterly Md

## 2014-03-20 NOTE — ED Notes (Signed)
OR calling for pt as [facility] transferring pt from stretcher to ED stretcher.

## 2014-03-20 NOTE — ED Notes (Addendum)
O2 sat dropped to 89% after dilaudid administration. 2L O2 by [place] placed on pt. Sat up to 99%

## 2014-03-21 DIAGNOSIS — S62109A Fracture of unspecified carpal bone, unspecified wrist, initial encounter for closed fracture: Secondary | ICD-10-CM

## 2014-03-21 DIAGNOSIS — Z23 Encounter for immunization: Secondary | ICD-10-CM | POA: Diagnosis not present

## 2014-03-21 DIAGNOSIS — F1010 Alcohol abuse, uncomplicated: Secondary | ICD-10-CM

## 2014-03-21 DIAGNOSIS — N390 Urinary tract infection, site not specified: Secondary | ICD-10-CM | POA: Diagnosis present

## 2014-03-21 DIAGNOSIS — S62113A Displaced fracture of triquetrum [cuneiform] bone, unspecified wrist, initial encounter for closed fracture: Secondary | ICD-10-CM | POA: Diagnosis present

## 2014-03-21 DIAGNOSIS — N329 Bladder disorder, unspecified: Secondary | ICD-10-CM | POA: Diagnosis present

## 2014-03-21 DIAGNOSIS — R141 Gas pain: Secondary | ICD-10-CM | POA: Diagnosis present

## 2014-03-21 DIAGNOSIS — IMO0002 Reserved for concepts with insufficient information to code with codable children: Secondary | ICD-10-CM | POA: Diagnosis present

## 2014-03-21 DIAGNOSIS — J449 Chronic obstructive pulmonary disease, unspecified: Secondary | ICD-10-CM | POA: Diagnosis present

## 2014-03-21 DIAGNOSIS — I10 Essential (primary) hypertension: Secondary | ICD-10-CM

## 2014-03-21 DIAGNOSIS — S5292XA Unspecified fracture of left forearm, initial encounter for closed fracture: Secondary | ICD-10-CM | POA: Diagnosis present

## 2014-03-21 DIAGNOSIS — M1990 Unspecified osteoarthritis, unspecified site: Secondary | ICD-10-CM | POA: Diagnosis present

## 2014-03-21 DIAGNOSIS — S022XXA Fracture of nasal bones, initial encounter for closed fracture: Secondary | ICD-10-CM

## 2014-03-21 DIAGNOSIS — F17200 Nicotine dependence, unspecified, uncomplicated: Secondary | ICD-10-CM | POA: Diagnosis present

## 2014-03-21 DIAGNOSIS — F1020 Alcohol dependence, uncomplicated: Secondary | ICD-10-CM | POA: Diagnosis present

## 2014-03-21 DIAGNOSIS — F1410 Cocaine abuse, uncomplicated: Secondary | ICD-10-CM | POA: Diagnosis present

## 2014-03-21 DIAGNOSIS — W108XXA Fall (on) (from) other stairs and steps, initial encounter: Secondary | ICD-10-CM | POA: Diagnosis present

## 2014-03-21 DIAGNOSIS — S52509A Unspecified fracture of the lower end of unspecified radius, initial encounter for closed fracture: Secondary | ICD-10-CM | POA: Diagnosis present

## 2014-03-21 LAB — CBC
HCT: 32.5 % — ABNORMAL LOW (ref 39.0–52.0)
Hemoglobin: 11.1 g/dL — ABNORMAL LOW (ref 13.0–17.0)
MCH: 34.6 pg — ABNORMAL HIGH (ref 26.0–34.0)
MCHC: 34.2 g/dL (ref 30.0–36.0)
MCV: 101.2 fL — AB (ref 78.0–100.0)
PLATELETS: 246 10*3/uL (ref 150–400)
RBC: 3.21 MIL/uL — ABNORMAL LOW (ref 4.22–5.81)
RDW: 13.8 % (ref 11.5–15.5)
WBC: 6.5 10*3/uL (ref 4.0–10.5)

## 2014-03-21 LAB — BASIC METABOLIC PANEL
BUN: 8 mg/dL (ref 6–23)
CALCIUM: 9 mg/dL (ref 8.4–10.5)
CO2: 25 meq/L (ref 19–32)
CREATININE: 0.96 mg/dL (ref 0.50–1.35)
Chloride: 99 mEq/L (ref 96–112)
GFR calc Af Amer: >90 mL/min (ref >90)
GFR, EST NON AFRICAN AMERICAN: 84 mL/min — AB (ref >90)
Glucose, Bld: 103 mg/dL — ABNORMAL HIGH (ref 70–99)
Potassium: 4.7 mEq/L (ref 3.7–5.3)
Sodium: 136 mEq/L — ABNORMAL LOW (ref 137–147)

## 2014-03-21 MED ORDER — POTASSIUM CHLORIDE 10 MEQ/100ML IV SOLN
10.0000 meq | INTRAVENOUS | Status: DC
  Filled 2014-03-21 (×5): qty 100

## 2014-03-21 MED ORDER — CEFAZOLIN SODIUM 1-5 GM-% IV SOLN
1.0000 g | Freq: Three times a day (TID) | INTRAVENOUS | Status: DC
  Administered 2014-03-21 – 2014-03-22 (×5): 1 g via INTRAVENOUS
  Filled 2014-03-21 (×7): qty 50

## 2014-03-21 MED ORDER — HYDROMORPHONE HCL PF 1 MG/ML IJ SOLN
0.5000 mg | INTRAMUSCULAR | Status: DC | PRN
  Administered 2014-03-22: 0.5 mg via INTRAVENOUS

## 2014-03-21 MED ORDER — HYDROMORPHONE HCL PF 1 MG/ML IJ SOLN: 1.0000 mg | INTRAMUSCULAR | Status: DC | PRN

## 2014-03-21 MED ORDER — AMLODIPINE BESYLATE 10 MG PO TABS
10.0000 mg | ORAL_TABLET | Freq: Every day | ORAL | Status: DC
  Administered 2014-03-21 – 2014-03-22 (×2): 10 mg via ORAL
  Filled 2014-03-21 (×2): qty 1

## 2014-03-21 MED ORDER — HYDROMORPHONE HCL PF 1 MG/ML IJ SOLN
1.0000 mg | INTRAMUSCULAR | Status: DC | PRN
  Administered 2014-03-21 (×3): 1 mg via INTRAVENOUS
  Filled 2014-03-21 (×4): qty 1

## 2014-03-21 MED ORDER — POTASSIUM CHLORIDE CRYS ER 20 MEQ PO TBCR
40.0000 meq | EXTENDED_RELEASE_TABLET | Freq: Once | ORAL | Status: AC
  Administered 2014-03-21: 40 meq via ORAL
  Filled 2014-03-21: qty 2

## 2014-03-21 MED ORDER — HEPARIN SODIUM (PORCINE) 5000 UNIT/ML IJ SOLN
5000.0000 [IU] | Freq: Three times a day (TID) | INTRAMUSCULAR | Status: DC
  Administered 2014-03-21 – 2014-03-22 (×2): 5000 [IU] via SUBCUTANEOUS
  Filled 2014-03-21 (×5): qty 1

## 2014-03-21 MED ORDER — METHOCARBAMOL 500 MG PO TABS
500.0000 mg | ORAL_TABLET | Freq: Four times a day (QID) | ORAL | Status: DC | PRN
  Administered 2014-03-21: 500 mg via ORAL
  Filled 2014-03-21: qty 1

## 2014-03-21 NOTE — Progress Notes (Signed)
Utilization Review Completed.  
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
## 2014-03-21 NOTE — Progress Notes (Signed)
Family Practice Teaching Service Interval Progress Note  Returned to pt's room after his surgery to check on him. He was sleeping comfortably. Examined his eyes and nose in greater detail. PERRL. Nasal septum without any obvious hematoma. Pt states nose feels mildly swollen but not severely. Will continue to monitor for signs of hematoma.  Levert Feinstein, MD Family Medicine PGY-2 Service Pager [phone]

## 2014-03-21 NOTE — Progress Notes (Signed)
FMTS Attending Daily Note:  Joe Don MD  [phone] pager  Family Practice pager:  [phone] I have seen and examined this patient and have reviewed their chart. I have discussed this patient with the resident. I agree with the resident's findings, assessment and care plan.  Additionally:  See separate admit note  Tobey Grim, MD 03/21/2014 12:00 PM
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
## 2014-03-21 NOTE — Anesthesia Postprocedure Evaluation (Signed)
  Anesthesia Post-op Note  Patient: Joe Durham  Procedure(s) Performed: Procedure(s): OPEN REDUCTION INTERNAL FIXATION (ORIF) WRIST FRACTURE (Left)  Patient Location: PACU  Anesthesia Type:MAC and Regional  Level of Consciousness: awake and alert   Airway and Oxygen Therapy: Patient Spontanous Breathing  Post-op Pain: none  Post-op Assessment: Post-op Vital signs reviewed, Patient's Cardiovascular Status Stable, Respiratory Function Stable, Patent Airway, No signs of Nausea or vomiting and Pain level controlled  Post-op Vital Signs: Reviewed and stable  Last Vitals:  Filed Vitals:   03/21/14 0500  BP: 196/91  Pulse: 86  Temp: 36.7 C  Resp: 18    Complications: No apparent anesthesia complications

## 2014-03-21 NOTE — Progress Notes (Signed)
Patient ID: Joe Durham, male   DOB: [birth year], 68 y.o.   MRN: [id number] Patient is stable  Drain is removed.  Patient has normal sensation to light touch and good early range of motion. We've discussed with him elevation range of motion and massage to the fingers. We would recommend he continue to be very careful and cautious and nonweightbearing to the affected left upper extremity  He is stable from our standpoint  He could be DC'd with return to my office in 12-14 days for followup  We discussed this with him at length.  

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
## 2014-03-22 MED ORDER — ACETAMINOPHEN 325 MG PO TABS: 650.0000 mg | ORAL_TABLET | Freq: Four times a day (QID) | ORAL | Status: DC | PRN

## 2014-03-22 MED ORDER — OXYCODONE HCL 5 MG PO TABS: 5.0000 mg | ORAL_TABLET | ORAL | Status: DC | PRN

## 2014-03-22 MED ORDER — POLYETHYLENE GLYCOL 3350 17 G PO PACK: 17.0000 g | PACK | Freq: Every day | ORAL | Status: DC | PRN

## 2014-03-22 MED ORDER — LISINOPRIL 10 MG PO TABS
10.0000 mg | ORAL_TABLET | Freq: Every day | ORAL | Status: DC
  Administered 2014-03-22: 10 mg via ORAL
  Filled 2014-03-22 (×2): qty 1

## 2014-03-22 MED ORDER — OXYCODONE HCL 5 MG PO TABS: 10.0000 mg | ORAL_TABLET | ORAL | Status: DC | PRN

## 2014-03-22 MED ORDER — AMLODIPINE BESYLATE 10 MG PO TABS: 10.0000 mg | ORAL_TABLET | Freq: Every day | ORAL | Status: DC
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
## 2014-03-22 NOTE — Progress Notes (Signed)
FMTS Attending Note Patient seen and examined by me, discussed with resident team and I agree with Dr Nyra Capes assessment and plan as per her note. Paula Compton, MD

## 2014-03-23 ENCOUNTER — Encounter (HOSPITAL_COMMUNITY): Payer: Self-pay | Admitting: Orthopedic Surgery

## 2014-03-23 LAB — URINE CULTURE
Colony Count: >=100000
Special Requests: NORMAL

## 2014-04-02 ENCOUNTER — Other Ambulatory Visit: Payer: Self-pay | Admitting: Urology

## 2014-04-05 ENCOUNTER — Ambulatory Visit: Payer: Medicare Other | Admitting: Family Medicine

## 2014-04-12 ENCOUNTER — Encounter: Payer: Self-pay | Admitting: Family Medicine

## 2014-04-12 ENCOUNTER — Ambulatory Visit (INDEPENDENT_AMBULATORY_CARE_PROVIDER_SITE_OTHER): Payer: Medicare Other | Admitting: Family Medicine

## 2014-04-12 VITALS — BP 137/90 | HR 101 | Temp 98.3°F | Wt 125.0 lb

## 2014-04-12 DIAGNOSIS — S52599A Other fractures of lower end of unspecified radius, initial encounter for closed fracture: Secondary | ICD-10-CM

## 2014-04-12 DIAGNOSIS — I10 Essential (primary) hypertension: Secondary | ICD-10-CM

## 2014-04-12 DIAGNOSIS — Z720 Tobacco use: Secondary | ICD-10-CM

## 2014-04-12 DIAGNOSIS — F17200 Nicotine dependence, unspecified, uncomplicated: Secondary | ICD-10-CM

## 2014-04-12 DIAGNOSIS — M25531 Pain in right wrist: Secondary | ICD-10-CM

## 2014-04-12 DIAGNOSIS — S52513A Displaced fracture of unspecified radial styloid process, initial encounter for closed fracture: Secondary | ICD-10-CM

## 2014-04-12 DIAGNOSIS — N210 Calculus in bladder: Secondary | ICD-10-CM

## 2014-04-12 DIAGNOSIS — M25539 Pain in unspecified wrist: Secondary | ICD-10-CM

## 2014-04-12 MED ORDER — AMLODIPINE BESYLATE 10 MG PO TABS: 10.0000 mg | ORAL_TABLET | Freq: Every day | ORAL | Status: DC

## 2014-04-12 MED ORDER — NICOTINE 14 MG/24HR TD PT24: 14.0000 mg | MEDICATED_PATCH | Freq: Every day | TRANSDERMAL | Status: DC

## 2014-04-12 NOTE — Assessment & Plan Note (Signed)
A: at goal P: continue current regimen 

## 2014-04-12 NOTE — Assessment & Plan Note (Addendum)
A: Chronic interested in trying nicotine patch  P: goal for 1 pack per 2 weeks by 1 month Nicotine 14mg  Cont cessation counseling
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
## 2014-04-12 NOTE — Patient Instructions (Addendum)
Mr Economides it was great to see you today!  I am pleased to hear that things are going well for you. Great job with your blood pressure Please continue taking those pills.   Please obtain an Xray of your wrist  Continue with Ibuprofen and ice as needed for discomfort  For your smoking Plan to see me back in 1 month.  Goal for that time is 1 pack per 2 weeks Place the patch daily  Please follow up with urology as scheduled  Looking forward to seeing you soon Charlane Ferretti, MD

## 2014-04-12 NOTE — Assessment & Plan Note (Signed)
A: questionable bladder mass on CT during admission P: f/up with urology for cysto and stone removal  Date appears to be June 19th

## 2014-04-14 ENCOUNTER — Encounter (HOSPITAL_BASED_OUTPATIENT_CLINIC_OR_DEPARTMENT_OTHER): Payer: Self-pay | Admitting: *Deleted

## 2014-04-14 ENCOUNTER — Ambulatory Visit (HOSPITAL_COMMUNITY)
Admission: RE | Admit: 2014-04-14 | Discharge: 2014-04-14 | Disposition: A | Discharge status: Home / Self Care | Payer: Medicare Other | Source: Ambulatory Visit | Attending: Family Medicine | Admitting: Family Medicine

## 2014-04-14 DIAGNOSIS — M25539 Pain in unspecified wrist: Secondary | ICD-10-CM | POA: Insufficient documentation

## 2014-04-14 DIAGNOSIS — S52513A Displaced fracture of unspecified radial styloid process, initial encounter for closed fracture: Secondary | ICD-10-CM

## 2014-04-15 ENCOUNTER — Telehealth: Payer: Self-pay | Admitting: Family Medicine

## 2014-04-15 ENCOUNTER — Encounter (HOSPITAL_BASED_OUTPATIENT_CLINIC_OR_DEPARTMENT_OTHER): Payer: Self-pay | Admitting: *Deleted

## 2014-04-15 NOTE — Telephone Encounter (Signed)
[facility] called and needs a couple of things done. One is for the doctor to look at the x-rays and decided if the pt needs to be seen or have other x-rays done. They would also like the x-rays faxed or sent to the pt's ortho doctor Dr. Amanda Pea office number is [phone]. [name]

## 2014-04-15 NOTE — Progress Notes (Signed)
NPO AFTER MN . ARRIVE AT 1000.  NEEDS ISTAT.  CURRENT EKG IN CHART AND EPIC. WILL TAKE NORVASC AM DOS W/ SIPS OF WATER.

## 2014-04-15 NOTE — Telephone Encounter (Signed)
Spoke with Dr. Brunetta Genera office.  Barbara from [facility] also called Dr. Brunetta Genera office and they have sent a message to Dr. Amanda Pea for him to review.  I faxed xray results to [phone]. [place], Maryjo [place]

## 2014-04-23 ENCOUNTER — Ambulatory Visit (HOSPITAL_BASED_OUTPATIENT_CLINIC_OR_DEPARTMENT_OTHER): Admission: RE | Admit: 2014-04-23 | Payer: Medicare Other | Source: Ambulatory Visit | Admitting: Urology

## 2014-04-23 HISTORY — DX: Presence of spectacles and contact lenses: Z97.3

## 2014-04-23 HISTORY — DX: Calculus in bladder: N21.0

## 2014-04-23 HISTORY — DX: Essential (primary) hypertension: I10

## 2014-04-23 HISTORY — DX: Chronic obstructive pulmonary disease, unspecified: J44.9

## 2014-04-23 SURGERY — CYSTOSCOPY
Anesthesia: General

## 2014-11-05 DIAGNOSIS — I639 Cerebral infarction, unspecified: Secondary | ICD-10-CM

## 2014-11-05 HISTORY — DX: Cerebral infarction, unspecified: I63.9

## 2014-12-06 DIAGNOSIS — Z8673 Personal history of transient ischemic attack (TIA), and cerebral infarction without residual deficits: Secondary | ICD-10-CM

## 2014-12-06 HISTORY — DX: Personal history of transient ischemic attack (TIA), and cerebral infarction without residual deficits: Z86.73

## 2015-01-03 ENCOUNTER — Encounter (HOSPITAL_COMMUNITY): Payer: Self-pay

## 2015-01-03 ENCOUNTER — Emergency Department (HOSPITAL_COMMUNITY): Payer: Medicare Other

## 2015-01-03 ENCOUNTER — Inpatient Hospital Stay (HOSPITAL_COMMUNITY)
Admission: EM | Admit: 2015-01-03 | Discharge: 2015-01-06 | DRG: 066 | Disposition: A | Discharge status: Home Health | Payer: Medicare Other | Attending: Family Medicine | Admitting: Family Medicine

## 2015-01-03 DIAGNOSIS — I159 Secondary hypertension, unspecified: Secondary | ICD-10-CM

## 2015-01-03 DIAGNOSIS — R269 Unspecified abnormalities of gait and mobility: Secondary | ICD-10-CM | POA: Diagnosis present

## 2015-01-03 DIAGNOSIS — H53462 Homonymous bilateral field defects, left side: Secondary | ICD-10-CM | POA: Diagnosis present

## 2015-01-03 DIAGNOSIS — F17210 Nicotine dependence, cigarettes, uncomplicated: Secondary | ICD-10-CM | POA: Diagnosis present

## 2015-01-03 DIAGNOSIS — I10 Essential (primary) hypertension: Secondary | ICD-10-CM | POA: Diagnosis present

## 2015-01-03 DIAGNOSIS — Z9119 Patient's noncompliance with other medical treatment and regimen: Secondary | ICD-10-CM | POA: Diagnosis present

## 2015-01-03 DIAGNOSIS — N2889 Other specified disorders of kidney and ureter: Secondary | ICD-10-CM | POA: Diagnosis not present

## 2015-01-03 DIAGNOSIS — S52509A Unspecified fracture of the lower end of unspecified radius, initial encounter for closed fracture: Secondary | ICD-10-CM | POA: Diagnosis present

## 2015-01-03 DIAGNOSIS — R918 Other nonspecific abnormal finding of lung field: Secondary | ICD-10-CM

## 2015-01-03 DIAGNOSIS — I129 Hypertensive chronic kidney disease with stage 1 through stage 4 chronic kidney disease, or unspecified chronic kidney disease: Secondary | ICD-10-CM | POA: Diagnosis present

## 2015-01-03 DIAGNOSIS — I63531 Cerebral infarction due to unspecified occlusion or stenosis of right posterior cerebral artery: Secondary | ICD-10-CM | POA: Diagnosis present

## 2015-01-03 DIAGNOSIS — J449 Chronic obstructive pulmonary disease, unspecified: Secondary | ICD-10-CM | POA: Diagnosis present

## 2015-01-03 DIAGNOSIS — F1010 Alcohol abuse, uncomplicated: Secondary | ICD-10-CM | POA: Diagnosis present

## 2015-01-03 DIAGNOSIS — Z7982 Long term (current) use of aspirin: Secondary | ICD-10-CM | POA: Diagnosis not present

## 2015-01-03 DIAGNOSIS — Z720 Tobacco use: Secondary | ICD-10-CM | POA: Diagnosis present

## 2015-01-03 DIAGNOSIS — H539 Unspecified visual disturbance: Secondary | ICD-10-CM | POA: Diagnosis present

## 2015-01-03 DIAGNOSIS — E785 Hyperlipidemia, unspecified: Secondary | ICD-10-CM | POA: Diagnosis present

## 2015-01-03 DIAGNOSIS — N182 Chronic kidney disease, stage 2 (mild): Secondary | ICD-10-CM | POA: Diagnosis present

## 2015-01-03 DIAGNOSIS — T508X5A Adverse effect of diagnostic agents, initial encounter: Secondary | ICD-10-CM | POA: Diagnosis not present

## 2015-01-03 DIAGNOSIS — I639 Cerebral infarction, unspecified: Secondary | ICD-10-CM

## 2015-01-03 DIAGNOSIS — R51 Headache: Secondary | ICD-10-CM

## 2015-01-03 DIAGNOSIS — I63431 Cerebral infarction due to embolism of right posterior cerebral artery: Secondary | ICD-10-CM

## 2015-01-03 DIAGNOSIS — Z8673 Personal history of transient ischemic attack (TIA), and cerebral infarction without residual deficits: Secondary | ICD-10-CM | POA: Diagnosis present

## 2015-01-03 DIAGNOSIS — R519 Headache, unspecified: Secondary | ICD-10-CM

## 2015-01-03 DIAGNOSIS — S52609A Unspecified fracture of lower end of unspecified ulna, initial encounter for closed fracture: Secondary | ICD-10-CM

## 2015-01-03 DIAGNOSIS — F1910 Other psychoactive substance abuse, uncomplicated: Secondary | ICD-10-CM | POA: Insufficient documentation

## 2015-01-03 LAB — COMPREHENSIVE METABOLIC PANEL
ALT: 10 U/L (ref 0–53)
AST: 24 U/L (ref 0–37)
Albumin: 3.8 g/dL (ref 3.5–5.2)
Alkaline Phosphatase: 78 U/L (ref 39–117)
Anion gap: 6 (ref 5–15)
BUN: 11 mg/dL (ref 6–23)
CO2: 29 mmol/L (ref 19–32)
Calcium: 9.9 mg/dL (ref 8.4–10.5)
Chloride: 103 mmol/L (ref 96–112)
Creatinine, Ser: 1.16 mg/dL (ref 0.50–1.35)
GFR calc Af Amer: 74 mL/min — ABNORMAL LOW (ref >90)
GFR calc non Af Amer: 64 mL/min — ABNORMAL LOW (ref >90)
Glucose, Bld: 92 mg/dL (ref 70–99)
Potassium: 4 mmol/L (ref 3.5–5.1)
Sodium: 138 mmol/L (ref 135–145)
Total Bilirubin: 0.9 mg/dL (ref 0.3–1.2)
Total Protein: 8.2 g/dL (ref 6.0–8.3)

## 2015-01-03 LAB — CBC WITH DIFFERENTIAL/PLATELET
Basophils Absolute: 0 10*3/uL (ref 0.0–0.1)
Basophils Relative: 0 % (ref 0–1)
Eosinophils Absolute: 0.1 10*3/uL (ref 0.0–0.7)
Eosinophils Relative: 1 % (ref 0–5)
HCT: 40.9 % (ref 39.0–52.0)
Hemoglobin: 14 g/dL (ref 13.0–17.0)
Lymphocytes Relative: 21 % (ref 12–46)
Lymphs Abs: 1.8 10*3/uL (ref 0.7–4.0)
MCH: 35.3 pg — ABNORMAL HIGH (ref 26.0–34.0)
MCHC: 34.2 g/dL (ref 30.0–36.0)
MCV: 103 fL — ABNORMAL HIGH (ref 78.0–100.0)
Monocytes Absolute: 1 10*3/uL (ref 0.1–1.0)
Monocytes Relative: 12 % (ref 3–12)
Neutro Abs: 5.4 10*3/uL (ref 1.7–7.7)
Neutrophils Relative %: 66 % (ref 43–77)
Platelets: 266 10*3/uL (ref 150–400)
RBC: 3.97 MIL/uL — ABNORMAL LOW (ref 4.22–5.81)
RDW: 13.5 % (ref 11.5–15.5)
WBC: 8.4 10*3/uL (ref 4.0–10.5)

## 2015-01-03 LAB — SEDIMENTATION RATE: Sed Rate: 38 mm/hr — ABNORMAL HIGH (ref 0–16)

## 2015-01-03 MED ORDER — MORPHINE SULFATE 4 MG/ML IJ SOLN
4.0000 mg | Freq: Once | INTRAMUSCULAR | Status: AC
  Administered 2015-01-03: 4 mg via INTRAVENOUS
  Filled 2015-01-03: qty 1

## 2015-01-03 MED ORDER — HYDRALAZINE HCL 20 MG/ML IJ SOLN
10.0000 mg | Freq: Once | INTRAMUSCULAR | Status: AC
  Administered 2015-01-03: 10 mg via INTRAVENOUS
  Filled 2015-01-03: qty 1

## 2015-01-03 MED ORDER — GADOBENATE DIMEGLUMINE 529 MG/ML IV SOLN
15.0000 mL | Freq: Once | INTRAVENOUS | Status: AC | PRN
  Administered 2015-01-03: 15 mL via INTRAVENOUS

## 2015-01-03 NOTE — ED Notes (Signed)
For 2-3 days has had flashes in his vision and can see outlines of objects, it will last a few seconds and then turns normal and also reports gait instability. No hx of Dm or HTN

## 2015-01-03 NOTE — ED Provider Notes (Signed)
CSN: [id number]     Arrival date & time 01/03/15  1427 History   First MD Initiated Contact with Patient 01/03/15 1527     Chief Complaint  Patient presents with  . Visual Field Change     (Consider location/radiation/quality/duration/timing/severity/associated sxs/prior Treatment) HPI Pt is a 69yo male with hx of COPD and HTN, presenting to ED with c/o 3 day hx of intermittent changes in vision, gait instability and right sided headache over right temporal area.  Pt denies hx of DM or HTN, however, states he has not seen a physician in 5-10 years as his PCP retired "many years ago."  Right sided head pain is sharp and aching, 10/10 at worst, waxes and wanes. He has not taken anything at home for pain.  Pt denies hx of HTN, however, medical records indicate previous dx of HTN. Pt does not take any daily medications.    Past Medical History  Diagnosis Date  . Wrist fracture, left     S/P  ORIF  03-20-2014--  has hard cast on  . Bladder calculi   . Fracture of nasal bone     03-20-2014   MILD COMMUNIUTED  . COPD (chronic obstructive pulmonary disease)   . Short of breath on exertion   . Hypertension   . Wears glasses   . Diplopia     PT STATES WHEN LYING DOWN HE SEES DOUPLE ,  WHEN HE SITS UP IT CLEARS UP   Past Surgical History  Procedure Laterality Date  . Orif wrist fracture Left 03/20/2014    Procedure: OPEN REDUCTION INTERNAL FIXATION (ORIF) WRIST FRACTURE;  Surgeon: Dominica Severin, MD;  Location: [facility] OR;  Service: Orthopedics;  Laterality: Left;  . Inguinal hernia repair Right 2011   History reviewed. No pertinent family history. History  Substance Use Topics  . Smoking status: Current Every Day Smoker -- 0.25 packs/day for 52 years    Types: Cigarettes  . Smokeless tobacco: Never Used     Comment: pt is down to 3 cig per day from 1 ppd  . Alcohol Use: Yes     Comment: " a fifth and a half a month"    Review of Systems  Constitutional: Negative for fever, chills,  diaphoresis and fatigue.  Eyes: Positive for visual disturbance. Negative for photophobia and pain.  Respiratory: Negative for cough and shortness of breath.   Cardiovascular: Negative for chest pain, palpitations and leg swelling.  Gastrointestinal: Negative for nausea, vomiting and abdominal pain.  Neurological: Positive for headaches ( right temporal area). Negative for dizziness, tremors, syncope, weakness, light-headedness and numbness.  All other systems reviewed and are negative.     Allergies  Review of patient's allergies indicates no known allergies.  Home Medications   Prior to Admission medications   Medication Sig Start Date End Date Taking? Authorizing Provider  oxyCODONE (OXY IR/ROXICODONE) 5 MG immediate release tablet Take 1 tablet (5 mg total) by mouth every 4 (four) hours as needed for severe pain. Patient not taking: Reported on 01/03/2015 03/22/14   Latrelle Dodrill, MD   BP 171/100 mmHg  Pulse 92  Temp(Src) 97.8 F (36.6 C) (Oral)  Resp 16  Ht 5\' 8"  (1.727 m)  Wt 141 lb (63.957 kg)  BMI 21.44 kg/m2  SpO2 98% Physical Exam  Constitutional: He is oriented to person, place, and time. He appears well-developed and well-nourished.  HENT:  Head: Normocephalic and atraumatic.    Tenderness to right temporal area. No erythema, warmth. No deformity.  Visual Acuity: Bilateral near: 20/200 (no glasses) R near: 20/400 L near: 20/400  Eyes: Conjunctivae and EOM are normal. Pupils are equal, round, and reactive to light. Right eye exhibits no discharge. Left eye exhibits no discharge. Scleral icterus is present.  Neck: Normal range of motion. Neck supple.  No nuchal rigidity or meningeal signs.  Cardiovascular: Normal rate, regular rhythm and normal heart sounds.   Pulmonary/Chest: Effort normal and breath sounds normal. No respiratory distress. He has no wheezes. He has no rales. He exhibits no tenderness.  Abdominal: Soft. Bowel sounds are normal. He exhibits  no distension and no mass. There is no tenderness. There is no rebound and no guarding.  Musculoskeletal: Normal range of motion.  Neurological: He is alert and oriented to person, place, and time. He has normal strength. No cranial nerve deficit or sensory deficit. He displays a negative Romberg sign. Coordination and gait normal. GCS eye subscore is 4. GCS verbal subscore is 5. GCS motor subscore is 6.  CN II-XII in tact. Speech is fluent. Alert and oriented to person, place and time. Normal 5/5 strength in upper and lower extremities with sensation in tact bilaterally. Normal gait.   Skin: Skin is warm and dry.  Nursing note and vitals reviewed.   ED Course  Procedures (including critical care time) Labs Review Labs Reviewed  COMPREHENSIVE METABOLIC PANEL - Abnormal; Notable for the following:    GFR calc non Af Amer 64 (*)    GFR calc Af Amer 74 (*)    All other components within normal limits  CBC WITH DIFFERENTIAL/PLATELET - Abnormal; Notable for the following:    RBC 3.97 (*)    MCV 103.0 (*)    MCH 35.3 (*)    All other components within normal limits  SEDIMENTATION RATE    Imaging Review Ct Head Wo Contrast  01/03/2015   CLINICAL DATA:  Vision changes  EXAM: CT HEAD WITHOUT CONTRAST  TECHNIQUE: Contiguous axial images were obtained from the base of the skull through the vertex without intravenous contrast.  COMPARISON:  CT head dated 03/20/2014  FINDINGS: No evidence of parenchymal hemorrhage or extra-axial fluid collection. No mass lesion, mass effect, or midline shift.  No CT evidence of acute infarction.  However, there is an age-indeterminate infarct in the right occipital lobe (series 201/image 16) which could be subacute or chronic, new from the prior study.  Encephalomalacic changes related to chronic left occipital lobe infarct (series 201/image 17).  Subcortical white matter and periventricular small vessel ischemic changes. Intracranial atherosclerosis.  Cerebral volume  is within normal limits.  No ventriculomegaly.  The visualized paranasal sinuses are essentially clear. The mastoid air cells are unopacified.  No evidence of calvarial fracture.  IMPRESSION: Age-indeterminate right occipital lobe infarct, favored to be subacute to chronic, although new from the prior study.  Chronic left occipital lobe infarct.  No evidence of acute intracranial abnormality.   Electronically Signed   By: Charline Bills M.D.   On: 01/03/2015 16:48   [name] XB Contrast  01/03/2015   CLINICAL DATA:  Hypertension. Visual disturbance over the last 2-3 days. Abnormal head CT.  EXAM: MRI HEAD WITHOUT AND WITH CONTRAST  TECHNIQUE: Multiplanar, multiecho pulse sequences of the brain and surrounding structures were obtained without and with intravenous contrast.  CONTRAST:  15mL MULTIHANCE GADOBENATE DIMEGLUMINE 529 MG/ML IV SOLN  COMPARISON:  Head CT same day.  Head CT 03/20/2014.  FINDINGS: Diffusion imaging shows acute infarction extensively affecting the right occipital  lobe consistent with right PCA territory infarction. There are punctate areas of involvement in the right posterior temporal lobe. No acute infarctions seen on the left. No swelling or hemorrhage.  The brainstem is normal. There are a few old small vessel cerebellar insults. There is old infarction in the left occipital lobe which has progressed to atrophy, encephalomalacia and gliosis. I think there are late subacute infarctions also in the left occipital region. There are minor chronic small-vessel ischemic changes within the hemispheric white matter. No evidence of neoplastic mass lesion, acute hemorrhage, hydrocephalus or extra-axial collection. After contrast administration, there is patchy enhancement in the right occipital region and to a much lesser extent in the left occipital region.  IMPRESSION: Mixed age infarctions affecting both occipital lobes. Old infarction is present in the left occipital lobe. There is also  late subacute infarction in the left occipital lobe. Right occipital lobe shows what probably represent areas of acute and subacute infarction. Mixed age infarctions in both occipital lobes suggest recurring posterior circulation embolic events.   Electronically Signed   By: Paulina Fusi M.D.   On: 01/03/2015 19:22     EKG Interpretation None      MDM   Final diagnoses:  Cerebral infarction due to unspecified mechanism  Secondary hypertension, unspecified  Visual changes  Right temporal headache    Pt is a 69yo male presenting to ED with c/o 2-3 day hx of intermittent visual changes and unsteady gait with right sided temporal headache.  Pt does have tenderness to right temporal area. Concern for temporal arteritis vs CVA vs SAH (less likely due to gradual onset).  CT head ordered as well as CBC, CMP (pt denies abdominal pain but does have scleral icterus) and sed rate.    5:08 PM CT head: evidence of age-indeterminate right occipital lobe infarct, favored to be subacute to chronic, although new from prior stud.  Chronic left occipital lobe infarct. No evidence of acute intracranial abnormality.  BP-increasing in pt, BP-187/124. Discussed pt with Dr. Jeraldine Loots, will get MRI brain as well as start pt on  hydralazine.    7:30 PM  MRI significant for mixed age infarctions affecting both occipital lobes. Old infarction is present in left occipital lobe.  There is also late subacute infection in left occipital lobe. Right occipital lobe shows probable areas of acute and subacute infarction.  Mixed age infarctions in both occipital lobes suggest recurring posterior circulation embolic events.  7:37 PM Consulted with Dr. Hosie Poisson, neurology, recommends pt be admitted to medicine for stroke workup.  BP is 171/100.  Pt does not have a PCP. Will consult internal medicine-unassigned.   7:44 PM Pt has been accepted to a tele bed for stroke workup, attending Dr. Daiva Eves. Temporary admission orders  placed. Pt is stable at this time.   7:55 PM Consulted with Dr. Mauricio Po, family medicine who states pt has actually been seen within the past 1 year.  Temporary orders changed to have pt under the care of Dr. Paula Compton, Family Medicine.   Junius Finner, PA-C 01/03/15 1946  Junius Finner, PA-C 01/03/15 1958  Gerhard Munch, MD 01/04/15 [phone]

## 2015-01-03 NOTE — H&P (Signed)
Family Medicine Teaching [facility] Admission History and Physical Service Pager: [phone]  Patient name: Jusiah Coppes Medical record number: [id number] Date of birth: 19-May-1946 Age: 69 y.o. Gender: male  Primary Care Provider: Hazeline Junker, MD Consultants: Neurology Code Status: Full (confirmed this admission)   Chief Complaint: Vision changes and gait changes for 3 days.   Assessment and Plan: Konstantin Heiges is a 69 y.o. male presenting with CVA . PMH is significant for COPD, HTN, ETOH abuse, and remote history of prostate enlargement.   #Acute/subacute stroke - Pt. With new onset vision changes and gait changes x 3 days. MRI with mixed-age occipital infarcts bilaterally, right with some evidence of acute process conerning for posterior circulation embolic events. Very poor PCP f/u (seen in our clinic June 2015 for hospital follow up, not a few years before and not since). No prior aspirin. No h/o CVA or TIA. - Admitted to observation on tele.   - q4 hr. Neuro checks. > nursing to get NIH eval.  - passed swallow screen.  - Neuro consulted in ED, follow up recs:  - A1c, lipid panel   - EKG  - MRA brain without contrast  - Echocardiogram, carotid dopplers  - Asa 325mg  daily (consider decr to 81mg  daily tomorrow)  - PT/OT/speech consults - BP control, diet/exercise for risk factor modification - Smoking cessation discussed and counseling ordered along with nicotine patch per pt request  #COPD - No clear diagnosis in the past. Per the notes reported by an ED doc on prior admission, but no PFT's in the chart or staging. Chronic tobacco abuse. Using just under 1ppd at this point.  - Monitor WOB. Some air movement on exam, but it is poor.  - Would benefit from further outpatient workup.  - O2 sats upper 90's here. No wheezes to exam.   # HTN - Hypertension noted in the past. Uncontrolled previously. Started on Norvasc 10mg  last year, but patient with self-admitted noncompliance,  and states that he takes very little medications. Bp 174/103 here.  - Permissive HTN here for now.  - Labetalol 10mg  q4hr prn BP > 220/>120  - Consider restarting norvasc for better BP control prior to D/C.   # ETOH Abuse -  Pt. Admits to drinking 1/5th of wine daily in addition to hard liquor. Elevated MCV to 103  On admission. He has been drinking this way for some time. Last drink was 2/28. He has never withdrawn before. No history of seizures.  - CIWA with (Ativan 0.5mg  prn given <70kg)   # Hx of BPH - DRE with some prostatic enlargment. Smooth surface. Reports nocturia, frequency, and some suprapubic pain. Afebrile with no leukocytosis or dysuria making incomplete emptying but no current infection likely. Has been told he had a large prostate in the past. Treated for enlarged prostate in his remote history, but none currently.  - Will get post-void bladder scan given suprapubic pain.  - UA and culture if fever or WBC - Consider further outpatient workup and follow up.   # Kidney Injury - Pt. With history of uncontrolled HTN, creatinine at 0.8- 0.9 one year ago. 1.16 here with GFR of 76. 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  - Aggressive outpatient management and control of HTN to prevent further kidney injury.  - Will monitor BMET for worsening kidney function.   FEN/GI: NPO until cleared by nurse swallow screen; then heart healthy diet.  Prophylaxis: ASA, Heparin Sub q.   Disposition: Pending  further workup and neuro recs.   History of Present Illness: Rajesh Wyss is a 69 y.o. male presenting with vision (blurry peripherally and difficulty focusing centrally) and gait changes x 3 days, right temporal headache, MRI brain in ED consistent with acute/subacute possible strokes. Eating and drinking ok at home. Does not recall having LOC or fainting episodes at home. Does report some gait changes over the past three days, but otherwise no difficulty with ambulation. Has not has  any palpitations or irregular heart beat that he can tell. He has never had the visual or gait symptoms before. Denies Chest pain / SOB. Denies diarrhea, N/V, Leg Swelling / pain. He does smoke arround 1 ppd of cigarettes. He is interested in quitting. Interested in nicotine patches here. He has known HTN. Pt. Denies taking any medications at home for this. Family hx of heart disease in his mother of unknown type.   Review Of Systems: Per HPI with the following additions: None Otherwise 12 point review of systems was performed and was unremarkable.  Patient Active Problem List   Diagnosis Date Noted  . CVA (cerebral infarction) 01/03/2015  . Tobacco abuse 04/12/2014  . Bladder stones 04/12/2014  . Right wrist pain 04/12/2014  . Left forearm fracture 03/21/2014  . Uncontrolled hypertension 03/20/2014  . Radius and ulna distal fracture 03/20/2014   Past Medical History: Past Medical History  Diagnosis Date  . Wrist fracture, left     S/P  ORIF  03-20-2014--  has hard cast on  . Bladder calculi   . Fracture of nasal bone     03-20-2014   MILD COMMUNIUTED  . COPD (chronic obstructive pulmonary disease)   . Short of breath on exertion   . Hypertension   . Wears glasses   . Diplopia     PT STATES WHEN LYING DOWN HE SEES DOUPLE ,  WHEN HE SITS UP IT CLEARS UP   Past Surgical History: Past Surgical History  Procedure Laterality Date  . Orif wrist fracture Left 03/20/2014    Procedure: OPEN REDUCTION INTERNAL FIXATION (ORIF) WRIST FRACTURE;  Surgeon: Dominica Severin, MD;  Location: [facility] OR;  Service: Orthopedics;  Laterality: Left;  . Inguinal hernia repair Right 2011   Social History: History  Substance Use Topics  . Smoking status: Current Every Day Smoker -- 0.25 packs/day for 52 years    Types: Cigarettes  . Smokeless tobacco: Never Used     Comment: pt is down to 3 cig per day from 1 ppd  . Alcohol Use: Yes     Comment: " a fifth and a half a month"   Additional social history:  1/5th of wine in addition to Liquor / day.   Please also refer to relevant sections of EMR.  Family History: History reviewed. No pertinent family history. Per patient, mother had heart issues. Allergies and Medications: No Known Allergies No current facility-administered medications on file prior to encounter.   Current Outpatient Prescriptions on File Prior to Encounter  Medication Sig Dispense Refill  . oxyCODONE (OXY IR/ROXICODONE) 5 MG immediate release tablet Take 1 tablet (5 mg total) by mouth every 4 (four) hours as needed for severe pain. (Patient not taking: Reported on 01/03/2015) 30 tablet 0    Objective: BP 174/103 mmHg  Pulse 95  Temp(Src) 98.1 F (36.7 C) (Oral)  Resp 14  Ht  (1.727 m)  Wt 141 lb (63.957 kg)  BMI 21.44 kg/m2  SpO2 99% Exam: General: NAD, AAOx3, resting comfortably.  HEENT: NCAT, PERRLA, EOMI, MMM Cardiovascular: RRR, No MGR, Normal S1/S2, 2+ distal pulses, no JVD  Respiratory: Decreased air movement BL, No wheezes, no crackles, no rales. Unlabored, appropriate rate.  Abdomen: S, Mild suprapubic TTP, No masses, no Organomegally, + BS, Mild distension.  Extremities: WWP, MAEW, 2+ distal pulses.  Skin: Well-healed scar over right hip s/p THR > 15 years ago per pt.  Neuro: AAOx3, CN II-XII tested and in tact, Visual fields per neuro note without full exam here, Full sensation to light touch / pressure globally, 5/5 motor in all extremities and muscle groups, Cerebellar - some difficulty and tremor on finger to nose testing bilaterally (hx of ETOH abuse), Heel to shin appropriate bilaterally, did not assess gait at this time. Rectal tone full. 5/5 bilateral UE and LE strength DRE: smooth enlarged prostate, normal tone Labs and Imaging: CBC BMET   Recent Labs Lab 01/03/15 1600  WBC 8.4  HGB 14.0  HCT 40.9  PLT 266    Recent Labs Lab 01/03/15 1600  NA 138  K 4.0  CL 103  CO2 29  BUN 11  CREATININE 1.16  GLUCOSE 92  CALCIUM 9.9      Head CT without contrast: IMPRESSION: Age-indeterminate right occipital lobe infarct, favored to be subacute to chronic, although new from the prior study.  Chronic left occipital lobe infarct.  No evidence of acute intracranial abnormality.  MRI brain with/without contrast: IMPRESSION: Mixed age infarctions affecting both occipital lobes. Old infarction is present in the left occipital lobe. There is also late subacute infarction in the left occipital lobe. Right occipital lobe shows what probably represent areas of acute and subacute infarction. Mixed age infarctions in both occipital lobes suggest recurring posterior circulation embolic events.  Yolande Jolly, MD 01/03/2015, 11:53 PM PGY-1, [facility]PT[facility] Intern pager: [phone], text pages welcome  I have seen and examined the patient with Dr Jaquita Rector and I agree with his assessment and plan with my additions in blue. Leona Singleton, MD PGY-3, [name]

## 2015-01-03 NOTE — Consult Note (Signed)
Stroke Consult    Chief Complaint: vision change  HPI: Joe Durham is an 69 y.o. male hx of COPD and HTN, presenting to ED with c/o 3 day hx of intermittent changes in vision, gait instability and right sided headache over right temporal area. Vision changes described as blurred peripheral and difficulty with focusing central vision. Pt denies hx of DM or HTN, however, states he has not seen a physician in 5-10 years as his PCP retired "many years ago." Right sided head pain is sharp and aching, 10/10 at worst, waxes and wanes.   No history of A fib but notes periods of "feeling my heart beat strange".   Denies any prior history of CVA or TIA. Does not take a daily ASA or any other medications. MRI brain imaging reviewed shows bilateral occipital infarcts of different age.   Date last known well: 12/31/2014 Time last known well: 2000 tPA Given: no, outside treatment window  Past Medical History  Diagnosis Date  . Wrist fracture, left     S/P  ORIF  03-20-2014--  has hard cast on  . Bladder calculi   . Fracture of nasal bone     03-20-2014   MILD COMMUNIUTED  . COPD (chronic obstructive pulmonary disease)   . Short of breath on exertion   . Hypertension   . Wears glasses   . Diplopia     PT STATES WHEN LYING DOWN HE SEES DOUPLE ,  WHEN HE SITS UP IT CLEARS UP    Past Surgical History  Procedure Laterality Date  . Orif wrist fracture Left 03/20/2014    Procedure: OPEN REDUCTION INTERNAL FIXATION (ORIF) WRIST FRACTURE;  Surgeon: Dominica Severin, MD;  Location: [facility] OR;  Service: Orthopedics;  Laterality: Left;  . Inguinal hernia repair Right 2011    History reviewed. No pertinent family history. Social History:  reports that he has been smoking Cigarettes.  He has a 13 pack-year smoking history. He has never used smokeless tobacco. He reports that he drinks alcohol. He reports that he does not use illicit drugs.  Allergies: No Known Allergies   (Not in a hospital  admission)  ROS: Out of a complete 14 system review, the patient complains of only the following symptoms, and all other reviewed systems are negative. +blurred vision  Physical Examination: Filed Vitals:   01/03/15 1929  BP: 171/100  Pulse: 92  Temp: 97.8 F (36.6 C)  Resp: 16   Physical Exam  Constitutional: He appears well-developed and well-nourished.  Psych: Affect appropriate to situation Eyes: No scleral injection HENT: No OP obstrucion Head: Normocephalic.  Cardiovascular: Normal rate and regular rhythm.  Respiratory: Effort normal and breath sounds normal.  GI: Soft. Bowel sounds are normal. No distension. There is no tenderness.  Skin: WDI  Neurologic Examination: Mental Status: Alert, oriented, thought content appropriate.  Speech fluent without evidence of aphasia.  No dysarthria. Able to follow 3 step commands without difficulty. Cranial Nerves: II: unable to visualize fundi, VA 20/300 bilateral eyes, left inferior homonymous hemianopsia, impaired central vision, pupils equal, round, reactive to light and accommodation III,IV, VI: ptosis not present, extra-ocular motions intact bilaterally V,VII: smile symmetric, facial light touch sensation normal bilaterally VIII: hearing normal bilaterally IX,X: gag reflex present XI: trapezius strength/neck flexion strength normal bilaterally XII: tongue strength normal  Motor: Right : Upper extremity    Left:     Upper extremity 5/5 deltoid       5/5 deltoid 5/5 biceps  5/5 biceps  5/5 triceps      5/5 triceps 5/5 hand grip      5/5 hand grip  Lower extremity     Lower extremity 5/5 hip flexor      5/5 hip flexor 5/5 quadricep      5/5 quadriceps  5/5 hamstrings     5/5 hamstrings 5/5 plantar flexion       5/5 plantar flexion 5/5 plantar extension     5/5 plantar extension Tone and bulk:normal tone throughout; no atrophy noted Sensory: Pinprick and light touch intact throughout, bilaterally Deep Tendon  Reflexes: 2+ and symmetric throughout Plantars: Right: downgoing   Left: downgoing Cerebellar: Difficulty with FTN bilaterally Gait: normal gait and station  Laboratory Studies:   Basic Metabolic Panel:  Recent Labs Lab 01/03/15 1600  NA 138  K 4.0  CL 103  CO2 29  GLUCOSE 92  BUN 11  CREATININE 1.16  CALCIUM 9.9    Liver Function Tests:  Recent Labs Lab 01/03/15 1600  AST 24  ALT 10  ALKPHOS 78  BILITOT 0.9  PROT 8.2  ALBUMIN 3.8   No results for input(s): LIPASE, AMYLASE in the last 168 hours. No results for input(s): AMMONIA in the last 168 hours.  CBC:  Recent Labs Lab 01/03/15 1600  WBC 8.4  NEUTROABS 5.4  HGB 14.0  HCT 40.9  MCV 103.0*  PLT 266    Cardiac Enzymes: No results for input(s): CKTOTAL, CKMB, CKMBINDEX, TROPONINI in the last 168 hours.  BNP: Invalid input(s): POCBNP  CBG: No results for input(s): GLUCAP in the last 168 hours.  Microbiology: Results for orders placed or performed during the hospital encounter of 03/20/14  Urine culture     Status: None   Collection Time: 03/20/14  1:00 PM  Result Value Ref Range Status   Specimen Description URINE, CLEAN CATCH  Final   Special Requests Normal  Final   Culture  Setup Time   Final    03/21/2014 01:36 Performed at [facility] Count   Final    >=100,000 COLONIES/ML Performed at [facility]   Culture   Final    LACTOBACILLUS SPECIES Note: Standardized susceptibility testing for this organism is not available. Performed at [facility]   Report Status 03/23/2014 FINAL  Final    Coagulation Studies: No results for input(s): LABPROT, INR in the last 72 hours.  Urinalysis: No results for input(s): COLORURINE, LABSPEC, PHURINE, GLUCOSEU, HGBUR, BILIRUBINUR, KETONESUR, PROTEINUR, UROBILINOGEN, NITRITE, LEUKOCYTESUR in the last 168 hours.  Invalid input(s): APPERANCEUR  Lipid Panel:  No results found for: CHOL, TRIG, HDL, CHOLHDL, VLDL,  LDLCALC  HgbA1C: No results found for: HGBA1C  Urine Drug Screen:     Component Value Date/Time   LABOPIA NONE DETECTED 03/20/2014 1241   COCAINSCRNUR POSITIVE* 03/20/2014 1241   LABBENZ NONE DETECTED 03/20/2014 1241   AMPHETMU NONE DETECTED 03/20/2014 1241   THCU NONE DETECTED 03/20/2014 1241   LABBARB NONE DETECTED 03/20/2014 1241    Alcohol Level: No results for input(s): ETH in the last 168 hours.  Other results:  Imaging: Ct Head Wo Contrast  01/03/2015   CLINICAL DATA:  Vision changes  EXAM: CT HEAD WITHOUT CONTRAST  TECHNIQUE: Contiguous axial images were obtained from the base of the skull through the vertex without intravenous contrast.  COMPARISON:  CT head dated 03/20/2014  FINDINGS: No evidence of parenchymal hemorrhage or extra-axial fluid collection. No mass lesion, mass effect, or midline shift.  No CT evidence of acute infarction.  However, there is an age-indeterminate infarct in the right occipital lobe (series 201/image 16) which could be subacute or chronic, new from the prior study.  Encephalomalacic changes related to chronic left occipital lobe infarct (series 201/image 17).  Subcortical white matter and periventricular small vessel ischemic changes. Intracranial atherosclerosis.  Cerebral volume is within normal limits.  No ventriculomegaly.  The visualized paranasal sinuses are essentially clear. The mastoid air cells are unopacified.  No evidence of calvarial fracture.  IMPRESSION: Age-indeterminate right occipital lobe infarct, favored to be subacute to chronic, although new from the prior study.  Chronic left occipital lobe infarct.  No evidence of acute intracranial abnormality.   Electronically Signed   By: Charline Bills M.D.   On: 01/03/2015 16:48   Mr Laqueta Jean ZO Contrast  01/03/2015   CLINICAL DATA:  Hypertension. Visual disturbance over the last 2-3 days. Abnormal head CT.  EXAM: MRI HEAD WITHOUT AND WITH CONTRAST  TECHNIQUE: Multiplanar, multiecho pulse  sequences of the brain and surrounding structures were obtained without and with intravenous contrast.  CONTRAST:  15mL MULTIHANCE GADOBENATE DIMEGLUMINE 529 MG/ML IV SOLN  COMPARISON:  Head CT same day.  Head CT 03/20/2014.  FINDINGS: Diffusion imaging shows acute infarction extensively affecting the right occipital lobe consistent with right PCA territory infarction. There are punctate areas of involvement in the right posterior temporal lobe. No acute infarctions seen on the left. No swelling or hemorrhage.  The brainstem is normal. There are a few old small vessel cerebellar insults. There is old infarction in the left occipital lobe which has progressed to atrophy, encephalomalacia and gliosis. I think there are late subacute infarctions also in the left occipital region. There are minor chronic small-vessel ischemic changes within the hemispheric white matter. No evidence of neoplastic mass lesion, acute hemorrhage, hydrocephalus or extra-axial collection. After contrast administration, there is patchy enhancement in the right occipital region and to a much lesser extent in the left occipital region.  IMPRESSION: Mixed age infarctions affecting both occipital lobes. Old infarction is present in the left occipital lobe. There is also late subacute infarction in the left occipital lobe. Right occipital lobe shows what probably represent areas of acute and subacute infarction. Mixed age infarctions in both occipital lobes suggest recurring posterior circulation embolic events.   Electronically Signed   By: Paulina Fusi M.D.   On: 01/03/2015 19:22    Assessment: 69 y.o. male with history of hypertension presenting with acute onset of impaired vision. MRI imaging shows bilateral occipital lobe infarcts of varying age concerning for possible posterior circulation embolic events.   Stroke Risk Factors - HTN  Plan: 1. HgbA1c, fasting lipid panel 2. MRA  of the brain without contrast 3. PT consult, OT  consult, Speech consult 4. Echocardiogram 5. Carotid dopplers 6. Prophylactic therapy-ASA  daily 7. Risk factor modification 8. Telemetry monitoring 9. Frequent neuro checks 10. NPO until RN stroke swallow screen 11. Check EKG   Elspeth Cho, DO [facility] [phone]  If 7pm- 7am, please page neurology on call as listed in AMION. 01/03/2015, 7:42 PM

## 2015-01-04 ENCOUNTER — Observation Stay (HOSPITAL_COMMUNITY): Payer: Medicare Other

## 2015-01-04 ENCOUNTER — Encounter (HOSPITAL_COMMUNITY): Payer: Self-pay

## 2015-01-04 DIAGNOSIS — H539 Unspecified visual disturbance: Secondary | ICD-10-CM

## 2015-01-04 DIAGNOSIS — I639 Cerebral infarction, unspecified: Secondary | ICD-10-CM

## 2015-01-04 DIAGNOSIS — I6340 Cerebral infarction due to embolism of unspecified cerebral artery: Secondary | ICD-10-CM

## 2015-01-04 DIAGNOSIS — Z720 Tobacco use: Secondary | ICD-10-CM

## 2015-01-04 DIAGNOSIS — I10 Essential (primary) hypertension: Secondary | ICD-10-CM

## 2015-01-04 DIAGNOSIS — F1910 Other psychoactive substance abuse, uncomplicated: Secondary | ICD-10-CM | POA: Insufficient documentation

## 2015-01-04 DIAGNOSIS — E785 Hyperlipidemia, unspecified: Secondary | ICD-10-CM | POA: Insufficient documentation

## 2015-01-04 LAB — CBC
HEMATOCRIT: 41.7 % (ref 39.0–52.0)
HEMOGLOBIN: 14 g/dL (ref 13.0–17.0)
MCH: 35.4 pg — AB (ref 26.0–34.0)
MCHC: 33.6 g/dL (ref 30.0–36.0)
MCV: 105.6 fL — ABNORMAL HIGH (ref 78.0–100.0)
Platelets: 263 10*3/uL (ref 150–400)
RBC: 3.95 MIL/uL — ABNORMAL LOW (ref 4.22–5.81)
RDW: 13.5 % (ref 11.5–15.5)
WBC: 7.9 10*3/uL (ref 4.0–10.5)

## 2015-01-04 LAB — RAPID URINE DRUG SCREEN, HOSP PERFORMED
Amphetamines: NOT DETECTED
BARBITURATES: NOT DETECTED
Benzodiazepines: NOT DETECTED
Cocaine: NOT DETECTED
Opiates: POSITIVE — AB
TETRAHYDROCANNABINOL: NOT DETECTED

## 2015-01-04 LAB — TSH: TSH: 1.513 u[IU]/mL (ref 0.350–4.500)

## 2015-01-04 LAB — BASIC METABOLIC PANEL
Anion gap: 10 (ref 5–15)
BUN: 11 mg/dL (ref 6–23)
CHLORIDE: 99 mmol/L (ref 96–112)
CO2: 28 mmol/L (ref 19–32)
Calcium: 10 mg/dL (ref 8.4–10.5)
Creatinine, Ser: 1.26 mg/dL (ref 0.50–1.35)
GFR calc Af Amer: 67 mL/min — ABNORMAL LOW (ref >90)
GFR calc non Af Amer: 58 mL/min — ABNORMAL LOW (ref >90)
GLUCOSE: 152 mg/dL — AB (ref 70–99)
Potassium: 3.8 mmol/L (ref 3.5–5.1)
Sodium: 137 mmol/L (ref 135–145)

## 2015-01-04 LAB — LIPID PANEL
CHOL/HDL RATIO: 3.9 ratio
Cholesterol: 271 mg/dL — ABNORMAL HIGH (ref 0–200)
HDL: 70 mg/dL (ref >39)
LDL Cholesterol: 185 mg/dL — ABNORMAL HIGH (ref 0–99)
Triglycerides: 79 mg/dL (ref <150)
VLDL: 16 mg/dL (ref 0–40)

## 2015-01-04 LAB — VITAMIN B12: Vitamin B-12: 550 pg/mL (ref 211–911)

## 2015-01-04 MED ORDER — HEPARIN SODIUM (PORCINE) 5000 UNIT/ML IJ SOLN
5000.0000 [IU] | Freq: Three times a day (TID) | INTRAMUSCULAR | Status: DC
  Administered 2015-01-04 – 2015-01-06 (×8): 5000 [IU] via SUBCUTANEOUS
  Filled 2015-01-04 (×8): qty 1

## 2015-01-04 MED ORDER — LORAZEPAM 0.5 MG PO TABS: 0.5000 mg | ORAL_TABLET | Freq: Four times a day (QID) | ORAL | Status: DC | PRN

## 2015-01-04 MED ORDER — AMLODIPINE BESYLATE 5 MG PO TABS
5.0000 mg | ORAL_TABLET | Freq: Every day | ORAL | Status: DC
  Administered 2015-01-04 – 2015-01-06 (×3): 5 mg via ORAL
  Filled 2015-01-04 (×3): qty 1

## 2015-01-04 MED ORDER — LABETALOL HCL 5 MG/ML IV SOLN: 10.0000 mg | INTRAVENOUS | Status: DC | PRN

## 2015-01-04 MED ORDER — SODIUM CHLORIDE 0.9 % IJ SOLN: 3.0000 mL | INTRAMUSCULAR | Status: DC | PRN

## 2015-01-04 MED ORDER — ASPIRIN EC 325 MG PO TBEC
325.0000 mg | DELAYED_RELEASE_TABLET | Freq: Every day | ORAL | Status: DC
  Administered 2015-01-04 – 2015-01-06 (×3): 325 mg via ORAL
  Filled 2015-01-04 (×3): qty 1

## 2015-01-04 MED ORDER — LORAZEPAM 2 MG/ML IJ SOLN: 0.5000 mg | Freq: Four times a day (QID) | INTRAMUSCULAR | Status: DC | PRN

## 2015-01-04 MED ORDER — NICOTINE 14 MG/24HR TD PT24
14.0000 mg | MEDICATED_PATCH | Freq: Every day | TRANSDERMAL | Status: DC
  Administered 2015-01-04 – 2015-01-06 (×3): 14 mg via TRANSDERMAL
  Filled 2015-01-04 (×3): qty 1

## 2015-01-04 MED ORDER — SODIUM CHLORIDE 0.9 % IV SOLN: 250.0000 mL | INTRAVENOUS | Status: DC | PRN

## 2015-01-04 MED ORDER — IOHEXOL 350 MG/ML SOLN
50.0000 mL | Freq: Once | INTRAVENOUS | Status: AC | PRN
  Administered 2015-01-04: 80 mL via INTRAVENOUS

## 2015-01-04 MED ORDER — ACETAMINOPHEN 325 MG PO TABS: 650.0000 mg | ORAL_TABLET | Freq: Four times a day (QID) | ORAL | Status: DC | PRN

## 2015-01-04 MED ORDER — ATORVASTATIN CALCIUM 40 MG PO TABS
40.0000 mg | ORAL_TABLET | Freq: Every day | ORAL | Status: DC
  Administered 2015-01-04 – 2015-01-05 (×2): 40 mg via ORAL
  Filled 2015-01-04 (×2): qty 1

## 2015-01-04 MED ORDER — FOLIC ACID 1 MG PO TABS
1.0000 mg | ORAL_TABLET | Freq: Every day | ORAL | Status: DC
  Administered 2015-01-04 – 2015-01-06 (×3): 1 mg via ORAL
  Filled 2015-01-04 (×3): qty 1

## 2015-01-04 MED ORDER — ACETAMINOPHEN 650 MG RE SUPP: 650.0000 mg | Freq: Four times a day (QID) | RECTAL | Status: DC | PRN

## 2015-01-04 MED ORDER — SODIUM CHLORIDE 0.9 % IJ SOLN
3.0000 mL | Freq: Two times a day (BID) | INTRAMUSCULAR | Status: DC
  Administered 2015-01-04 – 2015-01-05 (×4): 3 mL via INTRAVENOUS

## 2015-01-04 MED ORDER — VITAMIN B-1 100 MG PO TABS
100.0000 mg | ORAL_TABLET | Freq: Every day | ORAL | Status: DC
  Administered 2015-01-04 – 2015-01-06 (×3): 100 mg via ORAL
  Filled 2015-01-04 (×3): qty 1

## 2015-01-04 MED ORDER — SODIUM CHLORIDE 0.9 % IJ SOLN
3.0000 mL | Freq: Two times a day (BID) | INTRAMUSCULAR | Status: DC
  Administered 2015-01-04: 3 mL via INTRAVENOUS

## 2015-01-04 MED ORDER — ADULT MULTIVITAMIN W/MINERALS CH
1.0000 | ORAL_TABLET | Freq: Every day | ORAL | Status: DC
  Administered 2015-01-04 – 2015-01-06 (×3): 1 via ORAL
  Filled 2015-01-04 (×3): qty 1

## 2015-01-04 MED ORDER — THIAMINE HCL 100 MG/ML IJ SOLN
100.0000 mg | Freq: Every day | INTRAMUSCULAR | Status: DC
  Filled 2015-01-04: qty 2

## 2015-01-04 NOTE — Progress Notes (Signed)
SLP Cancellation Note  Patient Details Name: Joe Durham MRN: [id number] DOB: [birth year]   Cancelled treatment:       Reason Eval/Treat Not Completed: Patient at procedure or test/unavailable   Maxcine Ham, M.A. CCC-SLP [phone]  Maxcine Ham 01/04/2015, 2:35 PM

## 2015-01-04 NOTE — Progress Notes (Signed)
VASCULAR LAB PRELIMINARY  PRELIMINARY  PRELIMINARY  PRELIMINARY  Bilateral lower extremity venous duplex  completed.    Preliminary report:  Bilateral:  No evidence of DVT, superficial thrombosis, or Baker's Cyst.    Trey Gulbranson, RVT 01/04/2015, 2:34 PM

## 2015-01-04 NOTE — Progress Notes (Signed)
Family Medicine Teaching Service Daily Progress Note Intern Pager: [phone]  Patient name: Joe Durham Medical record number: [id number] Date of birth: 1946/05/26 Age: 69 y.o. Gender: male  Primary Care Provider: Hazeline Junker, MD Consultants: Neurology Code Status: Full  Pt Overview and Major Events to Date:  2/29 - Admitted for acute stroke workup.   Assessment and Plan: Joe Durham is a 69 y.o. male presenting with CVA . PMH is significant for COPD, HTN, ETOH abuse, and remote history of prostate enlargement.   #Acute/subacute stroke - Pt. With new onset vision changes and gait changes x 3 days. MRI with mixed-age occipital infarcts bilaterally, right with some evidence of acute process conerning for posterior circulation embolic events. Very poor PCP f/u (seen in our clinic June 2015 for hospital follow up, not a few years before and not since). No prior aspirin. No h/o CVA or TIA. Visual deficits clearing.  - Admitted to observation on tele.  - Workup continues with Echo, carotid dopplers pending - Neuro recs TEE, LE Duplex, consider Holter monitor given concern for paroxysmal atrial fibrillation, though would need to discuss anticoagulation given ETOH abuse history.   - passed swallow screen.  - A1c, lipid panel pending - CTA brain with right PCA stenosis, and bilateral Carotid disease.  - ASA  daily (consider decr to  prior to d/c). Could consider plavix as well. Will defer to Neuro.  - PT/OT/speech consults - BP control, diet/exercise for risk factor modification - Smoking cessation discussed and counseling ordered along with nicotine patch per pt request - Started on Lipitor  daily.   #COPD - No clear diagnosis in the past. Per the notes reported by an ED doc on prior admission, but no PFT's in the chart or staging. Chronic tobacco abuse. Using just under 1ppd at this point.  - Monitor WOB. Some air movement on exam, but it is poor.  - Would benefit  from further outpatient workup.  - O2 sats upper 90's here. No wheezes to exam.   # HTN - Hypertension noted in the past. Uncontrolled previously. Started on Norvasc  last year, but patient with self-admitted noncompliance, and states that he takes very little medications. Bp 174/103 here.  - Permissive HTN here on admission.  - Will start norvasc  to begin titrating bp down in anticipation of outpt. Follow up and BP management.  - Labetalol  q4hr prn BP > 220/>120   # ETOH Abuse - Pt. Admits to drinking 1/5th of wine daily in addition to hard liquor. Elevated MCV to 103 On admission. He has been drinking this way for some time. Last drink was 2/28. He has never withdrawn before. No history of seizures.  - CIWA with (Ativan 0.5mg  prn given <70kg)  - Folate / B12 ordered.   # Hx of BPH - DRE with some prostatic enlargment. Smooth surface. Reports nocturia, frequency, and some suprapubic pain. Afebrile with no leukocytosis or dysuria making incomplete emptying but no current infection likely. Has been told he had a large prostate in the past. Treated for enlarged prostate in his remote history, but none currently.  - Will get post-void bladder scan given suprapubic pain.  - UA and culture if fever or WBC - Consider further outpatient workup and follow up.   # Kidney Injury 2/2 Contrast load -  Pt. With history of uncontrolled HTN, creatinine at 0.8- 0.9 one year ago. 1.16 here with GFR of 76. 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  -  Creatinine trending up. CKD as above. Recent CT / MR w and w/o contrast. Cr 1.26 from baseline 0.96.  - Trend BMET. Oral hydration.  - Aggressive outpatient management and control of HTN to prevent further kidney injury.   FEN/GI: heart healthy diet.  Prophylaxis: ASA, Heparin Sub q.   Disposition: Pending further workup. F/U neuro recs.   Subjective:  No acute events overnight. He says that his visual fields are much improved.  He says that previously his peripheral vision was the most blurry, but now deficit resides in the left inferior quadrant. He was unable to ambulate well before due to vision deficit. Ambulating around the room very well this am. Little diffiiculty with rapid sit to stand. Hungry this am. In good spirits with son and sister in his room.   Objective: Temp:  [97.8 F (36.6 C)-98.5 F (36.9 C)] 98.3 F (36.8 C) (03/01 0600) Pulse Rate:  [83-102] 88 (03/01 0600) Resp:  [14-20] 14 (03/01 0600) BP: (143-192)/(90-124) 150/90 mmHg (03/01 0600) SpO2:  [95 %-99 %] 95 % (03/01 0600) Weight:  [141 lb (63.957 kg)] 141 lb (63.957 kg) (02/29 1436) Physical Exam: General: NAD, AAOx3, In good spirits  Cardiovascular: RRR, No MGR, Normal S1/S2 2+ distal pulses  Respiratory: CTA Bilatearlly, no wheezes, crackles, or rales. Diminished air movement bilaterally though he is moving some air.  Abdomen: S, NT, ND, No suprapubic tenderness this am, +BS Extremities: WWP, 2+ distal pulses, no calf tenderness, no lower extremity swelling or erythema noted.  Neuro: AAOx3, CN II-XII in tact, full strength in all extremities, no sensory deficits, Visual fields with left inferior quadrantinopsia, Ambulation without ataxia (pt. Reports this was due to visual defect previously), Cerebellar intact.    Laboratory:  Recent Labs Lab 01/03/15 1600  WBC 8.4  HGB 14.0  HCT 40.9  PLT 266    Recent Labs Lab 01/03/15 1600  NA 138  K 4.0  CL 103  CO2 29  BUN 11  CREATININE 1.16  CALCIUM 9.9  PROT 8.2  BILITOT 0.9  ALKPHOS 78  ALT 10  AST 24  GLUCOSE 92   Lipid Panel     Component Value Date/Time   CHOL 271* 01/04/2015 0855   TRIG 79 01/04/2015 0855   HDL 70 01/04/2015 0855   CHOLHDL 3.9 01/04/2015 0855   VLDL 16 01/04/2015 0855   LDLCALC 185* 01/04/2015 0855      Imaging/Diagnostic Tests: IMPRESSION: Acute/subacute infarct right occipital lobe with mild enhancement. Chronic infarct left parietal  lobe. 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  Moderate to severe stenosis right cavernous carotid and mild disease in the left cavernous carotid.  25% diameter stenosis proximal right internal carotid artery.  40% diameter stenosis proximal left internal carotid artery with a 2 mm ulcerated plaque in the carotid bulb on the left  Left vertebral artery is dominant widely patent. Right vertebral artery is small with a moderate stenosis distally.  Head CT without contrast: IMPRESSION: Age-indeterminate right occipital lobe infarct, favored to be subacute to chronic, although new from the prior study.  Chronic left occipital lobe infarct.  No evidence of acute intracranial abnormality.  MRI brain with/without contrast: IMPRESSION: Mixed age infarctions affecting both occipital lobes. Old infarction is present in the left occipital lobe. There is also late subacute infarction in the left occipital lobe. Right occipital lobe shows what probably represent areas of acute and subacute infarction. Mixed age infarctions in  both occipital lobes suggest recurring posterior circulation embolic events.   Yolande Jolly, MD 01/04/2015, 9:36 AM PGY-1, [facility] FPTS Intern pager: [phone], text pages welcome

## 2015-01-04 NOTE — Progress Notes (Signed)
UR completed 

## 2015-01-04 NOTE — Progress Notes (Signed)
    [facility] has been requested to perform a transesophageal echocardiogram on Joe Durham for Stroke work up.  After careful review of history and examination, the risks and benefits of transesophageal echocardiogram have been explained including risks of esophageal damage, perforation (1:10,000 risk), bleeding, pharyngeal hematoma as well as other potential complications associated with conscious sedation including aspiration, arrhythmia, respiratory failure and death. Alternatives to treatment were discussed, questions were answered. Patient is willing to proceed.   Loop recorder implant was also dicussed.   Wilburt Finlay, [facility] 01/04/2015 9:16 PM

## 2015-01-04 NOTE — Progress Notes (Addendum)
PT Cancellation Note  Patient Details Name: Joe Durham MRN: [id number] DOB: August 29, 1946   Cancelled Treatment:    Reason Eval/Treat Not Completed: Medical issues which prohibited therapy Pt on strict bedrest. Holding PT evaluation until there is an increase in activity orders. Will follow up appropriately.  On second attempt for PT evaluation, pt off floor at test.    Alvie Heidelberg A 01/04/2015, 9:03 AM  Alvie Heidelberg, PT, DPT [phone]

## 2015-01-04 NOTE — Progress Notes (Addendum)
STROKE TEAM PROGRESS NOTE   HISTORY Joe Durham is an 69 y.o. male hx of COPD and HTN, presenting to ED with c/o 3 day hx of intermittent changes in vision, gait instability and right sided headache over right temporal area. Vision changes described as blurred peripheral and difficulty with focusing central vision. Pt denies hx of DM or HTN, however, states he has not seen a physician in 5-10 years as his PCP retired "many years ago." Right sided head pain is sharp and aching, 10/10 at worst, waxes and wanes. No history of A fib but notes periods of "feeling my heart beat strange".  Denies any prior history of CVA or TIA. Does not take a daily ASA or any other medications. MRI brain imaging reviewed shows bilateral occipital infarcts of different age. He was last known well 12/31/2014 at 2000. Patient was not administered TPA secondary to outside treatment window. He was admitted for further evaluation and treatment.   SUBJECTIVE (INTERVAL HISTORY) No family is at the bedside.  Overall he feels his condition is stable. Sister arrived during rounds, grabbed a blanket and laid down on the couch to go to sleep. She said to wake her if she snored.  Pt admitted that he is still smoking, drinking. He used marijuana in the past but he also used cocaine, the last time use cocaine was 2-3 months ago.   OBJECTIVE Temp:  [97.5 F (36.4 C)-98.3 F (36.8 C)] 97.5 F (36.4 C) (03/01 1759) Pulse Rate:  [78-95] 78 (03/01 1759) Cardiac Rhythm:  [-] Sinus tachycardia;Heart block (03/01 0600) Resp:  [14-18] 18 (03/01 1759) BP: (135-174)/(74-103) 150/74 mmHg (03/01 1759) SpO2:  [95 %-100 %] 100 % (03/01 1759)  No results for input(s): GLUCAP in the last 168 hours.  Recent Labs Lab 01/03/15 1600 01/04/15 0855  NA 138 137  K 4.0 3.8  CL 103 99  CO2 29 28  GLUCOSE 92 152*  BUN 11 11  CREATININE 1.16 1.26  CALCIUM 9.9 10.0    Recent Labs Lab 01/03/15 1600  AST 24  ALT 10  ALKPHOS 78   BILITOT 0.9  PROT 8.2  ALBUMIN 3.8    Recent Labs Lab 01/03/15 1600 01/04/15 0855  WBC 8.4 7.9  NEUTROABS 5.4  --   HGB 14.0 14.0  HCT 40.9 41.7  MCV 103.0* 105.6*  PLT 266 263   No results for input(s): CKTOTAL, CKMB, CKMBINDEX, TROPONINI in the last 168 hours. No results for input(s): LABPROT, INR in the last 72 hours. No results for input(s): COLORURINE, LABSPEC, PHURINE, GLUCOSEU, HGBUR, BILIRUBINUR, KETONESUR, PROTEINUR, UROBILINOGEN, NITRITE, LEUKOCYTESUR in the last 72 hours.  Invalid input(s): APPERANCEUR     Component Value Date/Time   CHOL 271* 01/04/2015 0855   TRIG 79 01/04/2015 0855   HDL 70 01/04/2015 0855   CHOLHDL 3.9 01/04/2015 0855   VLDL 16 01/04/2015 0855   LDLCALC 185* 01/04/2015 0855   No results found for: HGBA1C    Component Value Date/Time   LABOPIA POSITIVE* 01/04/2015 1049   COCAINSCRNUR NONE DETECTED 01/04/2015 1049   LABBENZ NONE DETECTED 01/04/2015 1049   AMPHETMU NONE DETECTED 01/04/2015 1049   THCU NONE DETECTED 01/04/2015 1049   LABBARB NONE DETECTED 01/04/2015 1049    No results for input(s): ETH in the last 168 hours.   Ct Head Wo Contrast 01/03/2015    Age-indeterminate right occipital lobe infarct, favored to be subacute to chronic, although new from the prior study.  Chronic left occipital lobe infarct.  No  evidence of acute intracranial abnormality.     Ct Angio Head & Neck W/cm &/or Wo/cm 01/04/2015    Acute/subacute infarct right occipital lobe with mild enhancement. Chronic infarct left parietal lobe. 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  Moderate to severe stenosis right cavernous carotid and mild disease in the left cavernous carotid.  25% diameter stenosis proximal right internal carotid artery.  40% diameter stenosis proximal left internal carotid artery with a 2 mm ulcerated plaque in the carotid bulb on the left  Left vertebral  artery is dominant widely patent. Right vertebral artery is small with a moderate stenosis distally.     Mr Laqueta Jean Wo Contrast 01/03/2015   Mixed age infarctions affecting both occipital lobes. Old infarction is present in the left occipital lobe. There is also late subacute infarction in the left occipital lobe. Right occipital lobe shows what probably represent areas of acute and subacute infarction. Mixed age infarctions in both occipital lobes suggest recurring posterior circulation embolic events.     LE venous doppler - negative for DVT  2D echo pending   PHYSICAL EXAM  Temp:  [97.5 F (36.4 C)-98.3 F (36.8 C)] 97.5 F (36.4 C) (03/01 1759) Pulse Rate:  [78-95] 78 (03/01 1759) Resp:  [14-18] 18 (03/01 1759) BP: (135-174)/(74-103) 150/74 mmHg (03/01 1759) SpO2:  [95 %-100 %] 100 % (03/01 1759)  General - Well nourished, well developed, in no apparent distress.  Ophthalmologic - Sharp disc margins OU.  Cardiovascular - Regular rate and rhythm with no murmur.  Mental Status -  Level of arousal and orientation to time, place, and person were intact. Language including expression, naming, repetition, comprehension was assessed and found intact.  Cranial Nerves II - XII - II - Visual field intact OU except inconsistent finger count at left lower quadrant. III, IV, VI - Extraocular movements intact. V - Facial sensation intact bilaterally. VII - Facial movement intact bilaterally. VIII - Hearing & vestibular intact bilaterally. X - Palate elevates symmetrically. XI - Chin turning & shoulder shrug intact bilaterally. XII - Tongue protrusion intact.  Motor Strength - The patient's strength was normal in all extremities and pronator drift was absent.  Bulk was normal and fasciculations were absent.   Motor Tone - Muscle tone was assessed at the neck and appendages and was normal.  Reflexes - The patient's reflexes were normal in all extremities and he had no pathological  reflexes.  Sensory - Light touch, temperature/pinprick were assessed and were mild decreased at left hand.    Coordination - The patient had normal movements in the hands with no ataxia or dysmetria.  Tremor was absent.  Gait and Station - deferred due to safety concerns.  ASSESSMENT/PLAN Joe Durham is a 69 y.o. male with history of COPD, HTN, ETOH abuse, and remote history of prostate enlargement presenting with vision and gait changes for 3 days. He did not receive IV t-PA due to delay in arrival.   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 L ICA 40% stenosis w/ ulcerated plaque in bulb.   2D Echo  pending   Venous doppler negative  LDL 185, not at goal  HgbA1c pending  TEE to look for embolic source. Arranged with [facility] for tomorrow (I have made patient NPO after midnight tonight).   Patient is not a good candidate for loop candidate/anticoagulation secondary to cocaine history, medical noncompliance and large vessel athero.   Heparin 5000 units sq tid for VTE prophylaxis Diet Heart  Diet NPO time specified   no antithrombotic prior to admission, now on aspirin 325 mg orally every day. Please consider dural antiplatelet with ASA 81 and plavix  for 3 months and then plavix alone due to intracranial arterial stenosis.    Patient counseled to be compliant with his antithrombotic medications  Ongoing aggressive stroke risk factor management  Therapy recommendations:  Pending. Ok to be OOB. Will order.  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 Given large vessel intracranial atherosclerosis, patient should be treated with aspirin 81 mg and  clopidogrel 75 mg orally every day x 3 months for secondary stroke prevention. After 3 months, change to plavix alone. Long-term dual antiplatelets are contraindicated due to risk for intracerebral hemorrhage.   If embolic source found, will need to consider if he is an anticoagulation candidate  Hypertension  Home meds:   None listed  Elevated on admission  Stabilizing now  Permissive hypertension (OK if < 220/120) but gradually normalize in 5-7 days  Hyperlipidemia  LDL 185, goal < 70  added lipitor   Continue statin on discharge  Follow up with PCP as outpt  Hx of cocaine use  Pt admit last use 2-3 months ago  03/2014 UDS showed positive cocaine  Cocaine cessation counseling provided  Pt willing to quit  Tobacco abuse  Current smoker  Smoking cessation counseling provided  Pt is willing to quit  Other Stroke Risk Factors  Advanced age  ETOH use  Other Active Problems  COPD  BPH  Kidney injury, Cr 1.16  Hospital day # 1  Rhoderick Moody [facility] See Amion for Pager information 01/04/2015 9:57 AM   I, the attending vascular neurologist, have personally obtained a history, examined the patient, evaluated laboratory data, individually viewed imaging studies and agree with radiology interpretations. Together with the NP/PA, we formulated the assessment and plan of care which reflects our mutual decision.  I have made any additions or clarifications directly to the above note and agree with the findings and plan as currently documented.   69 year old male with history of hypertension, noncompliant with medication and not seeking medical attention was admitted for right PCA acute stroke. 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 CTA showed multiple large vessel atherosclerosis, patient had a history of cocaine use, currently smoker, alcohol abuse. His stroke most likely due to large vessel atherosclerosis, however embolic stroke not able to  completely ruled out. Will do TEE in a.m. However, he is not a good candidate for loop recorder. May consider 30 day cardiac event monitoring, however he has no insurance coverage at this moment. Recommend dural antiplatelet for 3 months and then Plavix alone. High LDL, Lipitor added. Will follow.  Marvel Plan, MD PhD Stroke Neurology 01/04/2015 6:51 PM       To contact Stroke Continuity provider, please refer to [email or web address]. After hours, contact General Neurology

## 2015-01-04 NOTE — Progress Notes (Signed)
OT Cancellation Note  Patient Details Name: Othniel Urciuoli MRN: [id number] DOB: Nov 08, 1945   Cancelled Treatment:    Reason Eval/Treat Not Completed: Medical issues which prohibited therapy (bedrest orders) Plesae update activity orders when appropriate to participate with therapy. Thanks [facility] [employer] Sinead Hockman, OTR/L  [phone] 01/04/2015 01/04/2015, 7:20 AM

## 2015-01-05 ENCOUNTER — Encounter (HOSPITAL_COMMUNITY)
Admission: EM | Disposition: A | Discharge status: Home Health | Payer: Self-pay | Source: Home / Self Care | Attending: Family Medicine

## 2015-01-05 ENCOUNTER — Inpatient Hospital Stay (HOSPITAL_COMMUNITY): Payer: Medicare Other

## 2015-01-05 ENCOUNTER — Encounter (HOSPITAL_COMMUNITY): Payer: Self-pay

## 2015-01-05 DIAGNOSIS — I6789 Other cerebrovascular disease: Secondary | ICD-10-CM

## 2015-01-05 DIAGNOSIS — R519 Headache, unspecified: Secondary | ICD-10-CM | POA: Insufficient documentation

## 2015-01-05 DIAGNOSIS — R51 Headache: Secondary | ICD-10-CM

## 2015-01-05 DIAGNOSIS — I63431 Cerebral infarction due to embolism of right posterior cerebral artery: Secondary | ICD-10-CM

## 2015-01-05 DIAGNOSIS — R918 Other nonspecific abnormal finding of lung field: Secondary | ICD-10-CM

## 2015-01-05 HISTORY — PX: LOOP RECORDER IMPLANT: SHX5477

## 2015-01-05 HISTORY — PX: TEE WITHOUT CARDIOVERSION: SHX5443

## 2015-01-05 LAB — FOLATE RBC
FOLATE, HEMOLYSATE: 416.6 ng/mL
Folate, RBC: 1055 ng/mL (ref >498)
Hematocrit: 39.5 % (ref 37.5–51.0)

## 2015-01-05 LAB — BASIC METABOLIC PANEL
ANION GAP: 5 (ref 5–15)
BUN: 11 mg/dL (ref 6–23)
CALCIUM: 9.4 mg/dL (ref 8.4–10.5)
CO2: 26 mmol/L (ref 19–32)
CREATININE: 1.09 mg/dL (ref 0.50–1.35)
Chloride: 107 mmol/L (ref 96–112)
GFR calc Af Amer: 80 mL/min — ABNORMAL LOW (ref >90)
GFR calc non Af Amer: 69 mL/min — ABNORMAL LOW (ref >90)
Glucose, Bld: 96 mg/dL (ref 70–99)
Potassium: 4 mmol/L (ref 3.5–5.1)
SODIUM: 138 mmol/L (ref 135–145)

## 2015-01-05 LAB — HEMOGLOBIN A1C
HEMOGLOBIN A1C: 4.7 % — AB (ref 4.8–5.6)
Mean Plasma Glucose: 88 mg/dL

## 2015-01-05 SURGERY — LOOP RECORDER IMPLANT

## 2015-01-05 SURGERY — ECHOCARDIOGRAM, TRANSESOPHAGEAL
Anesthesia: Moderate Sedation

## 2015-01-05 MED ORDER — MIDAZOLAM HCL 5 MG/ML IJ SOLN
INTRAMUSCULAR | Status: AC
  Filled 2015-01-05: qty 3

## 2015-01-05 MED ORDER — FENTANYL CITRATE 0.05 MG/ML IJ SOLN
INTRAMUSCULAR | Status: AC
  Filled 2015-01-05: qty 2

## 2015-01-05 MED ORDER — SODIUM CHLORIDE 0.9 % IV SOLN
INTRAVENOUS | Status: DC
  Administered 2015-01-05: 500 mL via INTRAVENOUS

## 2015-01-05 MED ORDER — LIDOCAINE-EPINEPHRINE 1 %-1:100000 IJ SOLN
INTRAMUSCULAR | Status: AC
  Filled 2015-01-05: qty 1

## 2015-01-05 MED ORDER — IOHEXOL 300 MG/ML  SOLN
80.0000 mL | Freq: Once | INTRAMUSCULAR | Status: AC | PRN
  Administered 2015-01-05: 80 mL via INTRAVENOUS

## 2015-01-05 MED ORDER — CEFAZOLIN SODIUM 1-5 GM-% IV SOLN
1.0000 g | Freq: Once | INTRAVENOUS | Status: AC
  Administered 2015-01-05: 1 g via INTRAVENOUS
  Filled 2015-01-05: qty 50

## 2015-01-05 MED ORDER — FENTANYL CITRATE 0.05 MG/ML IJ SOLN
INTRAMUSCULAR | Status: DC | PRN
  Administered 2015-01-05 (×3): 25 ug via INTRAVENOUS

## 2015-01-05 MED ORDER — DIPHENHYDRAMINE HCL 50 MG/ML IJ SOLN
INTRAMUSCULAR | Status: AC
  Filled 2015-01-05: qty 1

## 2015-01-05 MED ORDER — CEFAZOLIN (ANCEF) 1 G IV SOLR: 1.0000 g | Freq: Once | INTRAVENOUS | Status: DC

## 2015-01-05 MED ORDER — BUTAMBEN-TETRACAINE-BENZOCAINE 2-2-14 % EX AERO
INHALATION_SPRAY | CUTANEOUS | Status: DC | PRN
  Administered 2015-01-05: 2 via TOPICAL

## 2015-01-05 MED ORDER — MIDAZOLAM HCL 10 MG/2ML IJ SOLN
INTRAMUSCULAR | Status: DC | PRN
  Administered 2015-01-05: 1 mg via INTRAVENOUS
  Administered 2015-01-05 (×2): 2 mg via INTRAVENOUS

## 2015-01-05 MED ORDER — LABETALOL HCL 5 MG/ML IV SOLN
INTRAVENOUS | Status: AC
  Filled 2015-01-05: qty 4

## 2015-01-05 NOTE — Interval H&P Note (Signed)
History and Physical Interval Note:  01/05/2015 10:34 AM  Joe Durham  has presented today for surgery, with the diagnosis of stroke  The various methods of treatment have been discussed with the patient and family. After consideration of risks, benefits and other options for treatment, the patient has consented to  Procedure(s): TRANSESOPHAGEAL ECHOCARDIOGRAM (TEE)/LOOP (N/A) as a surgical intervention .  The patient's history has been reviewed, patient examined, no change in status, stable for surgery.  I have reviewed the patient's chart and labs.  Questions were answered to the patient's satisfaction.     [name]

## 2015-01-05 NOTE — Evaluation (Signed)
Occupational Therapy Evaluation Patient Details Name: Joe Durham MRN: [id number] DOB: 1946/05/15 Today's Date: 01/05/2015    History of Present Illness Patient is a 69 y/o male admitted with vision changes and gait changes for 3 days. PMH is significant for COPD, HTN, ETOH abuse, and remote history of prostate enlargement. 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 CTA head/neck-severe proximal R PCA stenosis. 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 L ICA 40% stenosis w/ ulcerated plaque in bulb. S/p TEE with implanted recorder 3/2.   Clinical Impression   Pt admitted with above. He demonstrates the below listed deficits and will benefit from continued OT to maximize safety and independence with BADLs.  Pt presents to OT with visual deficits - Lt lower quadrant field deficit- recommend ophthalmology consult and Humphries visual field test and HHOT to ensure he is able to perform IADLs safely.  Currenlty, he requires supervision with BADLs.  Will follow acutely.       Follow Up Recommendations  Home health OT;Supervision/Assistance - 24 hour    Equipment Recommendations  None recommended by OT    Recommendations for Other Services       Precautions / Restrictions Precautions Precautions: Fall Restrictions Weight Bearing Restrictions: No      Mobility Bed Mobility Overal bed mobility: Modified Independent Bed Mobility: Supine to Sit;Sit to Supine     Supine to sit: Modified independent (Device/Increase time) Sit to supine: Modified independent (Device/Increase time)   General bed mobility comments: HOB flat, no rails to simulate home environment.   Transfers Overall transfer level: Needs assistance Equipment used: None Transfers: Sit to/from Stand Sit to Stand: Min guard         General transfer comment: Sway and LOB initially upon standing.    Balance Overall balance assessment: Needs assistance Sitting-balance support: Feet  supported;No upper extremity supported Sitting balance-Leahy Scale: Good     Standing balance support: During functional activity Standing balance-Leahy Scale: Good                              ADL Overall ADL's : Needs assistance/impaired Eating/Feeding: Independent   Grooming: Wash/dry hands;Wash/dry face;Applying deodorant;Brushing hair;Supervision/safety;Standing   Upper Body Bathing: Supervision/ safety;Standing   Lower Body Bathing: Supervison/ safety;Sit to/from stand   Upper Body Dressing : Set up;Sitting   Lower Body Dressing: Supervision/safety;Sit to/from stand   Toilet Transfer: Supervision/safety;Ambulation;Comfort height toilet   Toileting- Clothing Manipulation and Hygiene: Supervision/safety;Sit to/from stand   Tub/ Shower Transfer: Tub transfer;Supervision/safety;Ambulation   Functional mobility during ADLs: Supervision/safety General ADL Comments: Pt was able to simulate shower in standing with supervision - no LOB.       Vision Vision Assessment?: Yes Eye Alignment: Within Functional Limits Ocular Range of Motion: Within Functional Limits Tracking/Visual Pursuits: Other (comment) Visual Fields: Left inferior homonymous quadranopsia Additional Comments: Pt reports he sees black spots all over the TV that come and go, and he sees snow, or dust falling constantly.   During pursuits, he loses objects in bil. Lower quadrants.  ? lt inferior quadrant field deficit    Perception Perception Perception Tested?: Yes   Praxis      Pertinent Vitals/Pain Pain Assessment: No/denies pain     Hand Dominance Right   Extremity/Trunk Assessment Upper Extremity Assessment Upper Extremity Assessment: Overall WFL for tasks assessed   Lower Extremity Assessment Lower Extremity Assessment: Defer to PT evaluation   Cervical / Trunk Assessment  Cervical / Trunk Assessment: Normal   Communication Communication Communication: No difficulties    Cognition Arousal/Alertness: Awake/alert Behavior During Therapy: WFL for tasks assessed/performed Overall Cognitive Status: Within Functional Limits for tasks assessed                     General Comments       Exercises       Shoulder Instructions      Home Living Family/patient expects to be discharged to:: Private residence Living Arrangements: Alone Available Help at Discharge: Family;Friend(s);Available PRN/intermittently (pt reports he can "arrange someone" to come help however very vague) Type of Home: Apartment Home Access: Stairs to enter Entrance Stairs-Number of Steps: flight (2nd floor) - 17 steps per pt report Entrance Stairs-Rails: Right;Left Home Layout: One level     Bathroom Shower/Tub: Tub/shower unit Shower/tub characteristics: Engineer, building services: Standard     Home Equipment: Cane - single point          Prior Functioning/Environment Level of Independence: Independent with assistive device(s)        Comments: Uses SPC for community ambulation and independent for household ambulation. Sister does IADLs - cooking/cleaning. Pt does not drive.     OT Diagnosis: Generalized weakness;Disturbance of vision   OT Problem List: Decreased strength;Impaired vision/perception;Decreased safety awareness   OT Treatment/Interventions: Self-care/ADL training;Visual/perceptual remediation/compensation    OT Goals(Current goals can be found in the care plan section) Acute Rehab OT Goals Patient Stated Goal: to go home  OT Goal Formulation: With patient Time For Goal Achievement: 01/12/15 Potential to Achieve Goals: Good ADL Goals Pt Will Perform Grooming: with modified independence;standing Pt Will Perform Upper Body Bathing: with modified independence;sitting Pt Will Perform Lower Body Bathing: with modified independence;sit to/from stand Pt Will Perform Upper Body Dressing: with modified independence;sitting Pt Will Perform Lower Body  Dressing: with modified independence;sit to/from stand Pt Will Transfer to Toilet: with modified independence;ambulating;regular height toilet Pt Will Perform Toileting - Clothing Manipulation and hygiene: with modified independence;sit to/from stand Additional ADL Goal #1: Pt will visually scan environment to locate needed items with no cues.   OT Frequency: Min 2X/week   Barriers to D/C: Decreased caregiver support          Co-evaluation              End of Session Nurse Communication: Mobility status  Activity Tolerance: Patient tolerated treatment well Patient left: in bed;with call bell/phone within reach;with bed alarm set   Time: 1610-9604 OT Time Calculation (min): 28 min Charges:  OT General Charges $OT Visit: 1 Procedure OT Evaluation $Initial OT Evaluation Tier I: 1 Procedure OT Treatments $Self Care/Home Management : 8-22 mins G-Codes:    Kareena Arrambide M 2015/01/12, 3:38 PM

## 2015-01-05 NOTE — Progress Notes (Signed)
Oozing from loop recorder site with saturated dressing. Pressure held for hemostasis and a new dressing applied. Will give one dose of antibiotics. Thurmon Fair, MD, [facility] [phone] office [phone] pager

## 2015-01-05 NOTE — Progress Notes (Signed)
STROKE TEAM PROGRESS NOTE   HISTORY Joe Durham is an 69 y.o. male hx of COPD and HTN, presenting to ED with c/o 3 day hx of intermittent changes in vision, gait instability and right sided headache over right temporal area. Vision changes described as blurred peripheral and difficulty with focusing central vision. Pt denies hx of DM or HTN, however, states he has not seen a physician in 5-10 years as his PCP retired "many years ago." Right sided head pain is sharp and aching, 10/10 at worst, waxes and wanes. No history of A fib but notes periods of "feeling my heart beat strange".  Denies any prior history of CVA or TIA. Does not take a daily ASA or any other medications. MRI brain imaging reviewed shows bilateral occipital infarcts of different age. He was last known well 12/31/2014 at 2000. Patient was not administered TPA secondary to outside treatment window. He was admitted for further evaluation and treatment.   SUBJECTIVE (INTERVAL HISTORY) States his test at at 08-1029. Says he still has some visual changes, especially the left lower quadrant. He agrees to quit cocaine. He is going to establish PCP care. He is going to have TEE today.     OBJECTIVE Temp:  [97.5 F (36.4 C)-98.2 F (36.8 C)] 97.6 F (36.4 C) (03/02 0601) Pulse Rate:  [78-86] 82 (03/02 0601) Cardiac Rhythm:  [-] Normal sinus rhythm (03/01 2000) Resp:  [18] 18 (03/02 0601) BP: (131-164)/(74-100) 131/100 mmHg (03/02 0601) SpO2:  [100 %] 100 % (03/02 0601)  No results for input(s): GLUCAP in the last 168 hours.  Recent Labs Lab 01/03/15 1600 01/04/15 0855  NA 138 137  K 4.0 3.8  CL 103 99  CO2 29 28  GLUCOSE 92 152*  BUN 11 11  CREATININE 1.16 1.26  CALCIUM 9.9 10.0    Recent Labs Lab 01/03/15 1600  AST 24  ALT 10  ALKPHOS 78  BILITOT 0.9  PROT 8.2  ALBUMIN 3.8    Recent Labs Lab 01/03/15 1600 01/04/15 0855  WBC 8.4 7.9  NEUTROABS 5.4  --   HGB 14.0 14.0  HCT 40.9 41.7  MCV 103.0*  105.6*  PLT 266 263   No results for input(s): CKTOTAL, CKMB, CKMBINDEX, TROPONINI in the last 168 hours. No results for input(s): LABPROT, INR in the last 72 hours. No results for input(s): COLORURINE, LABSPEC, PHURINE, GLUCOSEU, HGBUR, BILIRUBINUR, KETONESUR, PROTEINUR, UROBILINOGEN, NITRITE, LEUKOCYTESUR in the last 72 hours.  Invalid input(s): APPERANCEUR     Component Value Date/Time   CHOL 271* 01/04/2015 0855   TRIG 79 01/04/2015 0855   HDL 70 01/04/2015 0855   CHOLHDL 3.9 01/04/2015 0855   VLDL 16 01/04/2015 0855   LDLCALC 185* 01/04/2015 0855   Lab Results  Component Value Date   HGBA1C 4.7* 01/04/2015      Component Value Date/Time   LABOPIA POSITIVE* 01/04/2015 1049   COCAINSCRNUR NONE DETECTED 01/04/2015 1049   LABBENZ NONE DETECTED 01/04/2015 1049   AMPHETMU NONE DETECTED 01/04/2015 1049   THCU NONE DETECTED 01/04/2015 1049   LABBARB NONE DETECTED 01/04/2015 1049    No results for input(s): ETH in the last 168 hours.   Ct Head Wo Contrast 01/03/2015    Age-indeterminate right occipital lobe infarct, favored to be subacute to chronic, although new from the prior study.  Chronic left occipital lobe infarct.  No evidence of acute intracranial abnormality.     Ct Angio Head & Neck W/cm &/or Wo/cm 01/04/2015    Acute/subacute  infarct right occipital lobe with mild enhancement. Chronic infarct left parietal lobe. 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  Moderate to severe stenosis right cavernous carotid and mild disease in the left cavernous carotid.  25% diameter stenosis proximal right internal carotid artery.  40% diameter stenosis proximal left internal carotid artery with a 2 mm ulcerated plaque in the carotid bulb on the left  Left vertebral artery is dominant widely patent. Right vertebral artery is small with a moderate stenosis distally.     Mr Laqueta Jean Wo  Contrast 01/03/2015   Mixed age infarctions affecting both occipital lobes. Old infarction is present in the left occipital lobe. There is also late subacute infarction in the left occipital lobe. Right occipital lobe shows what probably represent areas of acute and subacute infarction. Mixed age infarctions in both occipital lobes suggest recurring posterior circulation embolic events.     LE venous doppler - negative for DVT  2D Echocardiogram  EF 60% with no source of embolus.   TEE - no cardiac source of emboli   PHYSICAL EXAM General - Well nourished, well developed, in no apparent distress.  Ophthalmologic - Sharp disc margins OU.  Cardiovascular - Regular rate and rhythm with no murmur.  Mental Status -  Level of arousal and orientation to time, place, and person were intact. Language including expression, naming, repetition, comprehension was assessed and found intact.  Cranial Nerves II - XII - II - Visual field decreased at left lower quadrant. III, IV, VI - Extraocular movements intact. V - Facial sensation intact bilaterally. VII - Facial movement intact bilaterally. VIII - Hearing & vestibular intact bilaterally. X - Palate elevates symmetrically. XI - Chin turning & shoulder shrug intact bilaterally. XII - Tongue protrusion intact.  Motor Strength - The patient's strength was normal in all extremities and pronator drift was absent.  Bulk was normal and fasciculations were absent.   Motor Tone - Muscle tone was assessed at the neck and appendages and was normal.  Reflexes - The patient's reflexes were normal in all extremities and he had no pathological reflexes.  Sensory - Light touch, temperature/pinprick were assessed and were mild decreased at left hand.    Coordination - The patient had normal movements in the hands with no ataxia or dysmetria.  Tremor was absent.  Gait and Station - deferred due to safety concerns.   ASSESSMENT/[place] Mr. Joe Durham is  a 69 y.o. male with history of COPD, HTN, ETOH abuse, and remote history of prostate enlargement presenting with vision and gait changes for 3 days. He did not receive IV t-PA due to delay in arrival.   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 L ICA 40% stenosis w/ ulcerated plaque in bulb.   2D Echo EF 60% with no source of embolus.   Venous doppler negative for DVT  TEE negative also for cardiac source of emboli  LDL 185, not at goal  HgbA1c 4.7 TEE to look for embolic source. Arranged with [facility] for today.  Patient is not a good candidate for loop candidate/anticoagulation secondary to cocaine history, medical noncompliance and large vessel athero.  Heparin 5000 units sq tid for VTE prophylaxis  no antithrombotic prior to admission, now on aspirin 325 mg orally every day. Please consider dual antiplatelet with ASA 81 and plavix  for 3 months and then plavix alone due to intracranial arterial stenosis.    Patient counseled to be compliant with his antithrombotic medications  Check RPR and HIV given drug use. No indication for other vasculitic workup.  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 Given large vessel intracranial atherosclerosis, patient should be treated with aspirin 81 mg and clopidogrel 75 mg orally every day x 3 months for secondary stroke prevention. After 3 months, change to plavix alone. Long-term dual antiplatelets are contraindicated due to risk for intracerebral hemorrhage.   Hypertension  Home meds:   None listed  Elevated on  admission  Stabilizing now  Permissive hypertension (OK if < 220/120) but gradually normalize in 5-7 days  Hyperlipidemia  LDL 185, goal < 70  added lipitor   Continue statin on discharge  Follow up with PCP as outpt  Hx of cocaine use  Pt admit last use 2-3 months ago  03/2014 UDS showed positive cocaine  Cocaine cessation counseling provided  Pt willing to quit  Tobacco abuse  Current smoker  Smoking cessation counseling provided  Pt is willing to quit  Other Stroke Risk Factors  Advanced age  ETOH use, drinks wine and liquor, on CIWA protocol  Other Active Problems  COPD  BPH  Kidney injury, Cr 1.16  Hospital day # 2  Joe Durham [facility] See Amion for Pager information 01/05/2015 9:57 AM   I, the attending vascular neurologist, have personally obtained a history, examined the patient, evaluated laboratory data, individually viewed imaging studies and agree with radiology interpretations. Together with the NP/PA, we formulated the assessment and Durham of care which reflects our mutual decision.  I have made any additions or clarifications directly to the above note and agree with the findings and Durham as currently documented.   69 year old male with history of hypertension, noncompliant with medication and not seeking medical attention was admitted for right PCA acute stroke. 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 CTA showed multiple large vessel atherosclerosis, patient had a history of cocaine use, currently smoker, alcohol abuse. His stroke most likely due to large vessel atherosclerosis, however embolic stroke not able to completely ruled out. TEE negative. He is not a good candidate for loop recorder. Recommend dural antiplatelet for 3 months and then Plavix alone. High LDL, Lipitor added. It seems that he got loop recorder eventually, will sign off and follow up in clinic.  Neurology will sign off. Please call with questions. Pt  will follow up with Dr. Roda Shutters at [facility] in about 2 months. Thanks for the consult.   Joe Plan, MD PhD Stroke Neurology 01/05/2015 2:16 PM        To contact Stroke Continuity provider, please refer to [email or web address]. After hours, contact General Neurology

## 2015-01-05 NOTE — Progress Notes (Signed)
  Echocardiogram 2D Echocardiogram has been performed.  Leta Jungling M 01/05/2015, 8:46 AM

## 2015-01-05 NOTE — Progress Notes (Signed)
Occupational Therapy Cancellation:  Pt currently at a procedure.  Will reattempt OT eval.  Jeani Hawking, OTR/L [phone]

## 2015-01-05 NOTE — Progress Notes (Signed)
SLP Cancellation Note  Patient Details Name: Joe Durham MRN: [id number] DOB: September 18, 1946   Cancelled treatment:        Unable to complete SLE at this time, as pt is off unit for TEE. Will continue efforts.  Celia B. Murvin [facility], CCC-SLP [phone]  Joe Durham 01/05/2015, 10:48 AM

## 2015-01-05 NOTE — Evaluation (Signed)
Physical Therapy Evaluation Patient Details Name: Joe Durham MRN: [id number] DOB: [birth year] Today's Date: 01/05/2015   History of Present Illness  Patient is a 69 y/o male admitted with vision changes and gait changes for 3 days. PMH is significant for COPD, HTN, ETOH abuse, and remote history of prostate enlargement. 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 CTA head/neck-severe proximal R PCA stenosis. 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 L ICA 40% stenosis w/ ulcerated plaque in bulb. S/p TEE with implanted recorder 3/2.    Clinical Impression  Patient presents with balance deficits and intermittent visual changes impacting mobility. May benefit from AD for safe ambulation. 2 LOB requiring min A to prevent fall. Would benefit from skilled PT to improve higher level balance prior to discharge to minimize fall risk and maximize independence prior to return home. Recommend initial 24/7 S at home for safety.    Follow Up Recommendations Home health PT;Supervision for mobility/OOB    Equipment Recommendations  None recommended by PT    Recommendations for Other Services       Precautions / Restrictions Precautions Precautions: Fall Restrictions Weight Bearing Restrictions: No      Mobility  Bed Mobility Overal bed mobility: Needs Assistance Bed Mobility: Supine to Sit;Sit to Supine     Supine to sit: Modified independent (Device/Increase time) Sit to supine: Modified independent (Device/Increase time)   General bed mobility comments: HOB flat, no rails to simulate home environment.   Transfers Overall transfer level: Needs assistance Equipment used: None Transfers: Sit to/from Stand Sit to Stand: Min guard         General transfer comment: Sway and LOB initially upon standing.  Ambulation/Gait Ambulation/Gait assistance: Min assist Ambulation Distance (Feet): 200 Feet Assistive device: None Gait Pattern/deviations:  Step-through pattern;Decreased stride length;Scissoring;Staggering left;Staggering right   Gait velocity interpretation: Below normal speed for age/gender General Gait Details: Min A due to 2 LOB with scrissoring gait before getting to door. More steady gait in hall with mild drifting. No higher level balance performed.   Stairs Stairs: Yes Stairs assistance: Min guard Stair Management: Alternating pattern;One rail Left Number of Stairs: 12 General stair comments: Min guard for safety as pt with notable weakness during ascent. Cues for technique.  Wheelchair Mobility    Modified Rankin (Stroke Patients Only) Modified Rankin (Stroke Patients Only) Pre-Morbid Rankin Score: Moderate disability Modified Rankin: Moderately severe disability     Balance Overall balance assessment: Needs assistance Sitting-balance support: Feet supported;No upper extremity supported Sitting balance-Leahy Scale: Good     Standing balance support: During functional activity Standing balance-Leahy Scale: Fair                               Pertinent Vitals/Pain Pain Assessment: No/denies pain    Home Living Family/patient expects to be discharged to:: Private residence Living Arrangements: Alone Available Help at Discharge: Family;Friend(s);Available PRN/intermittently (pt reports he can "arrange someone" to come help however very vague) Type of Home: Apartment Home Access: Stairs to enter Entrance Stairs-Rails: Right;Left Entrance Stairs-Number of Steps: flight (2nd floor) - 17 steps per pt report Home Layout: One level Home Equipment: Cane - single point      Prior Function Level of Independence: Independent with assistive device(s)         Comments: Uses SPC for community ambulation and independent for household ambulation. Sister does IADLs - cooking/cleaning. Pt does not drive.  Hand Dominance   Dominant Hand: Right    Extremity/Trunk Assessment   Upper  Extremity Assessment: Defer to OT evaluation           Lower Extremity Assessment: Overall WFL for tasks assessed         Communication   Communication: No difficulties  Cognition Arousal/Alertness: Awake/alert Behavior During Therapy: WFL for tasks assessed/performed Overall Cognitive Status: Within Functional Limits for tasks assessed (Difficulty stating date, "2nd month, 3rd day")                      General Comments      Exercises        Assessment/Plan    PT Assessment Patient needs continued PT services  PT Diagnosis Difficulty walking   PT Problem List Decreased balance;Decreased mobility;Decreased activity tolerance;Decreased safety awareness  PT Treatment Interventions Balance training;Gait training;Stair training;Functional mobility training;Therapeutic activities;Therapeutic exercise;Patient/family education;DME instruction   PT Goals (Current goals can be found in the Care Plan section) Acute Rehab PT Goals Patient Stated Goal: to get some rest PT Goal Formulation: With patient Time For Goal Achievement: 01/19/15 Potential to Achieve Goals: Fair    Frequency Min 3X/week   Barriers to discharge Decreased caregiver support Pt lives alone but reports he can "arrange" for someone to help him at home.    Co-evaluation               End of Session Equipment Utilized During Treatment: Gait belt Activity Tolerance: Patient limited by fatigue Patient left: in bed;with bed alarm set;with call bell/phone within reach Nurse Communication: Mobility status         Time: 1610-9604 PT Time Calculation (min) (ACUTE ONLY): 17 min   Charges:   PT Evaluation $Initial PT Evaluation Tier I: 1 Procedure     PT G CodesAlvie Heidelberg A January 10, 2015, 2:55 PM Alvie Heidelberg, PT, DPT [phone]

## 2015-01-05 NOTE — Progress Notes (Signed)
  Echocardiogram Echocardiogram Transesophageal has been performed.  Delcie Roch 01/05/2015, 11:22 AM

## 2015-01-05 NOTE — Op Note (Signed)
INDICATIONS: cryptogenic stroke  PROCEDURE:   Informed consent was obtained prior to the procedure. The risks, benefits and alternatives for the procedure were discussed and the patient comprehended these risks.  Risks include, but are not limited to, cough, sore throat, vomiting, nausea, somnolence, esophageal and stomach trauma or perforation, bleeding, low blood pressure, aspiration, pneumonia, infection, trauma to the teeth and death.    After a procedural time-out, the oropharynx was anesthetized with 20% benzocaine spray. The patient was given 5 mg versed and 75 mcg fentanyl for moderate sedation.   The transesophageal probe was inserted in the esophagus and stomach without difficulty and multiple views were obtained.  The patient was kept under observation until the patient left the procedure room.  The patient left the procedure room in stable condition.   Agitated microbubble saline contrast was not administered.  COMPLICATIONS:    There were no immediate complications.  FINDINGS:  No cardiac or embolic souce of embolism was identified.  RECOMMENDATIONS:   Loop recorder implantation  Time Spent Directly with the Patient:  45 minutes   Joe Durham 01/05/2015, 11:20 AM

## 2015-01-05 NOTE — Progress Notes (Signed)
Family Medicine Teaching Service Daily Progress Note Intern Pager: [phone]  Patient name: Joe Durham Medical record number: [id number] Date of birth: [birth year] Age: 69 y.o. Gender: male  Primary Care Provider: Hazeline Junker, MD Consultants: Neurology Code Status: Full  Pt Overview and Major Events to Date:  2/29 - Admitted for acute stroke workup.   Assessment and Plan: Josh Shurtleff is a 69 y.o. male presenting with CVA . PMH is significant for COPD, HTN, ETOH abuse, and remote history of prostate enlargement.   #Acute/subacute stroke - Pt. With new onset vision changes and gait changes x 3 days. MRI with mixed-age occipital infarcts bilaterally, right with some evidence of acute process conerning for posterior circulation embolic events. Very poor PCP f/u (seen in our clinic June 2015 for hospital follow up, not a few years before and not since). No prior aspirin. No h/o CVA or TIA. Visual deficits clearing.  - Admitted to observation on tele.  - Workup continues with TEE pending this am. Echo done. F/u read. - LE duplex negative.   - consider 30 day monitor given concern for paroxysmal atrial fibrillation, though would need to discuss anticoagulation given ETOH abuse history.   - Neuro recs dual antiplatelet therapy ASA / Plavix for 3 months and then plavix thereafter.  - A1c normal.  - CTA brain with right PCA stenosis, and bilateral Carotid disease. See problem below for CT addendum.  - PT/OT pending.  - BP control, diet/exercise for risk factor modification - Smoking cessation discussed and counseling ordered along with nicotine patch per pt request - Started on Lipitor 40mg  daily.   # Pulmonary Nodules -41mm and 1.8cm nodule noted as addendum to CT head and neck read. Recommended CT chest. Extensive smoking hx. Currently smoking 1ppd.  - CT chest w contrast.  - BMET for cr monitoring closely. Has been trending up with repeated contrast load.  - f/u read and consider  consult to pulm once we have more information.   #COPD - No clear diagnosis in the past. Per the notes reported by an ED doc on prior admission, but no PFT's in the chart or staging. Chronic tobacco abuse. Using just under 1ppd at this point.  - Monitor WOB. Some air movement on exam, but it is poor.  - Would benefit from further outpatient workup.  - O2 sats upper 90's here. No wheezes to exam.   # HTN - Hypertension noted in the past. Uncontrolled previously. Started on Norvasc 10mg  last year, but patient with self-admitted noncompliance, and states that he takes very little medications. Bp 174/103 here.  - Permissive HTN here on admission.  - Will start norvasc 5mg  to begin titrating bp down in anticipation of outpt. Follow up and BP management.  - Labetalol 10mg  q4hr prn BP > 220/>120   # ETOH Abuse - Pt. Admits to drinking 1/5th of wine daily in addition to hard liquor. Elevated MCV to 103 On admission. He has been drinking this way for some time. Last drink was 2/28. He has never withdrawn before. No history of seizures.  - CIWA with (Ativan 0.5mg  prn given <70kg)  - Folate / B12 ordered.   # Hx of BPH - DRE with some prostatic enlargment. Smooth surface. Reports nocturia, frequency, and some suprapubic pain. Afebrile with no leukocytosis or dysuria making incomplete emptying but no current infection likely. Has been told he had a large prostate in the past. Treated for enlarged prostate in his remote history, but none currently.  -  Will get post-void bladder scan given suprapubic pain.  - UA and culture if fever or WBC - Consider further outpatient workup and follow up.   # Kidney Injury 2/2 Contrast load -  Pt. With history of uncontrolled HTN, creatinine at 0.8- 0.9 one year ago. 1.16 here with GFR of 76. 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  - Creatinine trending up. CKD as above. Recent CT / MR w contrast. Cr 1.26  from baseline 0.96.  - Trend BMET. Oral hydration.  - Aggressive outpatient management and control of HTN to prevent further kidney injury.   FEN/GI: heart healthy diet.  Prophylaxis: ASA, Heparin Sub q.   Disposition: Pending further workup. F/U neuro recs.   Subjective:  NAEON. Doing well this am. Says that he slept very well last night. No new changes or deficits. No complaints this am. Ready for TEE.   Objective: Temp:  [97.5 F (36.4 C)-98.2 F (36.8 C)] 97.6 F (36.4 C) (03/02 0601) Pulse Rate:  [78-86] 82 (03/02 0601) Resp:  [18] 18 (03/02 0601) BP: (131-164)/(74-100) 131/100 mmHg (03/02 0601) SpO2:  [100 %] 100 % (03/02 0601) Physical Exam: General: NAD, AAOx3, In good spirits  Cardiovascular: RRR, No MGR, Normal S1/S2 2+ distal pulses  Respiratory: CTA Bilatearlly, no wheezes, crackles, or rales. Diminished air movement bilaterally   Abdomen: S, NT, ND,  +BS, No organomegaly.  Extremities: WWP, 2+ distal pulses. MAEW  Neuro: AAOx3, CN III-XII in tact, full strength in all extremities, no sensory deficits, Visual fields with left inferior deficit, Gait intact, Cerebellar intact.    Laboratory:  Recent Labs Lab 01/03/15 1600 01/04/15 0855  WBC 8.4 7.9  HGB 14.0 14.0  HCT 40.9 41.7  PLT 266 263    Recent Labs Lab 01/03/15 1600 01/04/15 0855  NA 138 137  K 4.0 3.8  CL 103 99  CO2 29 28  BUN 11 11  CREATININE 1.16 1.26  CALCIUM 9.9 10.0  PROT 8.2  --   BILITOT 0.9  --   ALKPHOS 78  --   ALT 10  --   AST 24  --   GLUCOSE 92 152*   Lipid Panel     Component Value Date/Time   CHOL 271* 01/04/2015 0855   TRIG 79 01/04/2015 0855   HDL 70 01/04/2015 0855   CHOLHDL 3.9 01/04/2015 0855   VLDL 16 01/04/2015 0855   LDLCALC 185* 01/04/2015 0855      Imaging/Diagnostic Tests:  CT Angio Head / Neck 3/1 -  ADDENDUM REPORT: 01/04/2015 10:12  ADDENDUM: Right upper lobe irregular nodule measures 8 mm. Left upper lobe sub solid nodule measures 18  mm. Neoplasm not excluded. Chest CT recommended for further evaluation.   Electronically Signed  By: Marlan Palau M.D.  On: 01/04/2015 10:12  IMPRESSION: Acute/subacute infarct right occipital lobe with mild enhancement. Chronic infarct left parietal lobe. 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  Moderate to severe stenosis right cavernous carotid and mild disease in the left cavernous carotid.  25% diameter stenosis proximal right internal carotid artery.  40% diameter stenosis proximal left internal carotid artery with a 2 mm ulcerated plaque in the carotid bulb on the left  Left vertebral artery is dominant widely patent. Right vertebral artery is small with a moderate stenosis distally.  Electronically Signed: By: Marlan Palau M.D. On: 01/04/2015 08:08  Head CT 2/29 -  IMPRESSION: Acute/subacute infarct right occipital lobe with mild enhancement. Chronic infarct left parietal lobe. 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  Moderate to severe stenosis right cavernous carotid and mild disease in the left cavernous carotid.  25% diameter stenosis proximal right internal carotid artery.  40% diameter stenosis proximal left internal carotid artery with a 2 mm ulcerated plaque in the carotid bulb on the left  Left vertebral artery is dominant widely patent. Right vertebral artery is small with a moderate stenosis distally.  Head CT without contrast: IMPRESSION: Age-indeterminate right occipital lobe infarct, favored to be subacute to chronic, although new from the prior study.  Chronic left occipital lobe infarct.  No evidence of acute intracranial abnormality.  MRI brain with/without contrast: IMPRESSION: Mixed age infarctions affecting both occipital  lobes. Old infarction is present in the left occipital lobe. There is also late subacute infarction in the left occipital lobe. Right occipital lobe shows what probably represent areas of acute and subacute infarction. Mixed age infarctions in both occipital lobes suggest recurring posterior circulation embolic events.   Yolande Jolly, MD 01/05/2015, 9:44 AM PGY-1, [facility] FPTS Intern pager: [phone], text pages welcome

## 2015-01-05 NOTE — Progress Notes (Signed)
PT Cancellation Note  Patient Details Name: Joe Durham MRN: [id number] DOB: October 08, 1946   Cancelled Treatment:    Reason Eval/Treat Not Completed: Patient at procedure or test/unavailable  pt off floor at test. Will follow up next available time.  Alvie Heidelberg A 01/05/2015, 11:46 AM Alvie Heidelberg, PT, DPT [phone]

## 2015-01-05 NOTE — Op Note (Signed)
LOOP RECORDER IMPLANT   Procedure report  Procedure performed:  Loop recorder implantation  Reason for procedure:  Cryptogenic stroke Procedure performed by:  Thurmon Fair, MD  Complications:  None  Estimated blood loss:  <5 mL  Medications administered during procedure:  Lidocaine 1% w epi locally, 8 mL Device details:  Medtronic Reveal Linq model number [id number], serial number [id number] Procedure details:  After the risks and benefits of the procedure were discussed the patient provided informed consent. He was brought to the cardiac catheter lab in the fasting state. The patient was prepped and draped in usual sterile fashion. Local anesthesia with 1% lidocaine was administered to an area 2 cm to the left of the sternum in the 4th intercostal space. A horizontal incision was made using the incision tool. The introducer was then used to create a subcutaneous tunnel and carefully deploy the device. Local pressure was held to ensure hemostasis.  The incision was closed with SteriStrips and a sterile dressing was applied.  R waves >0.4 mV.  Thurmon Fair, MD, [facility] [phone] office [phone] pager 01/05/2015 11:52 AM

## 2015-01-06 ENCOUNTER — Encounter (HOSPITAL_COMMUNITY): Payer: Self-pay | Admitting: Cardiovascular Disease

## 2015-01-06 LAB — RPR, QUANT+TP ABS (REFLEX)
Rapid Plasma Reagin, Quant: 1:2 {titer} — ABNORMAL HIGH
TREPONEMA PALLIDUM AB: POSITIVE — AB

## 2015-01-06 LAB — HIV ANTIBODY (ROUTINE TESTING W REFLEX): HIV Screen 4th Generation wRfx: NONREACTIVE

## 2015-01-06 LAB — RPR: RPR Ser Ql: REACTIVE — AB

## 2015-01-06 MED ORDER — AMLODIPINE BESYLATE 5 MG PO TABS: 5.0000 mg | ORAL_TABLET | Freq: Every day | ORAL | Status: DC

## 2015-01-06 MED ORDER — CLOPIDOGREL BISULFATE 75 MG PO TABS: 75.0000 mg | ORAL_TABLET | Freq: Every day | ORAL | Status: DC

## 2015-01-06 MED ORDER — ASPIRIN 325 MG PO TBEC: 325.0000 mg | DELAYED_RELEASE_TABLET | Freq: Every day | ORAL | Status: DC

## 2015-01-06 MED ORDER — ATORVASTATIN CALCIUM 40 MG PO TABS: 40.0000 mg | ORAL_TABLET | Freq: Every day | ORAL | Status: DC

## 2015-01-06 NOTE — Discharge Summary (Signed)
[facility] Discharge Summary  Patient name: Joe Durham Medical record number: [id number] Date of birth: [birth year] Age: 69 y.o. Gender: male Date of Admission: 01/03/2015  Date of Discharge: 01/06/2015 Admitting Physician: Truman Hayward, MD  Primary Care Provider: Vance Gather, MD Consultants: Neurology, Cardiology  Indication for Hospitalization: CVA  Discharge Diagnoses/Problem List:  CVA HTN Pulmonary Nodules Chronic Tobacco Abuse COPD - historic remark, but without formal testing / diagnosis.  Kidney Injury - resolved  Disposition: Home with Home Health PT/OT  Discharge Condition: Stable  Discharge Exam:  General: NAD, Comfortable this am.  Cardiovascular: RRR, No MGR, Normal S1/S2 2+ distal pulses  Respiratory: CTA Bilatearlly, no wheezes, crackles, or rales. Diminished air movement bilaterally  Abdomen: S, NT, ND, +BS, No organomegaly.  Extremities: WWP, 2+ distal pulses. MAEW  Neuro: AAOx3, CN III-XII in tact, full strength in all extremities, no sensory deficits, Visual fields with left inferior deficit (left inferior hemianopsia), Gait intact, Cerebellar intact.   Brief Hospital Course:  [name] is a 69 y.o. male presenting with CVA . PMH is significant for COPD, HTN, ETOH abuse, and remote history of prostate enlargement.   Pt. Presented to the ED with new onset vision changes and gait changes x 3 days. Head CT negative for acute bleed. MRI with mixed-age occipital infarcts bilaterally, right with some evidence of acute process conerning for posterior circulation embolic events. Very poor PCP f/u (seen in our clinic June 2015 for hospital follow up, a few years before and not since). No prior aspirin. No h/o CVA or TIA. Neurology saw him in the ED and noted left inferior hemianopsia. Further workup was unrevealing. Concern during this hospitalization was for embolic stroke. He had a full workup including LE duplex that  was negative, TEE, TTE, that were both negative for right to left atrial flow. CTA was performed which showed significant plaque formation and lesions of the bilateral carotids and posterior circulation leading [place] to believe that this was the likely source. See CTA report for further details. Neuro was concerned also that he had an arrhythmic event with clot formation, and felt that he would benefit from a Loop recorder in order to determine whether he was having cardiac events or not. Loop recorder was placed by cardiology who will follow up with him as an outpatient. PT/OT evaluation with recommendations for outpatient physical therapy and occupational therapy for improvement in ADLS at home. Neuro recommendations were for blood pressure control, and ASA/ Plavix for 3 months with transition to Plavix alone after that. He was started on Lipitor 94m daily after this.   Additionally, pt. Was noted initially to have right sided temporal tenderness to palpation. ESR was slightly elevated at 89for age. This tenderness resolved and does not occur with mastication. If reoccurring in the future, may consider temporal artery biopsy.   HTN - Pt. With history of uncontrolled HTN. Previously started on Norvasc, but noncompliant at home. Restarted here on Norvasc 556mdaily, but would likely benefit from ACE for renal protection. Could consider adding or switching as an outpatient. Blood pressures nearly normotensive on Norvasc here, but could likely tolerate further increase as an outpatient.   Pulmonary Nodules - Found incidentally during CTA of head / neck. CT with contrast performed to further evaluate. Multiple pulmonary sub-solid nodules with some ground glass appearance with concern for possible malignancy given history of smoking. Based on CT results recommendation is to follow up with repeat imaging in 3 months.  Will need to be scheduled for this as an outpatient with potential pulmonology referral based on  repeat CT results. He continues to smoke 1 pack / day. Discussed cessation during this admission. No other red flag warnings at this time.   Alcohol Abuse - Pt. Admits to drinking 1/5th of wine per day in addition to hard liquor. MCV elevated to 103 here, but no frank anemia. B12 normal, and Folate normal. He was placed on CIWA protocol, but did not require any ativan, nor did he score on the CIWA scale during his admission. Continue to follow up this dependence.   Kidney Injury - Likely due to contrast load from multiple episodes of imaging. Resolved prior to discharge.     Issues for Follow Up:  1. Follow up Compliance with medications.  2. Follow up blood pressure 3. Ensure follow up with cardiology / neurology.  4. Follow up right sided temporal tenderness.  5. Tobacco abuse. Follow up desire to quit as well as need for formal COPD evaluation.   Significant Procedures:  TEE, Cardiac Loop recorder   Significant Labs and Imaging:   Recent Labs Lab 01/03/15 1600 01/04/15 0855 01/04/15 1115  WBC 8.4 7.9  --   HGB 14.0 14.0  --   HCT 40.9 41.7 39.5  PLT 266 263  --     Recent Labs Lab 01/03/15 1600 01/04/15 0855 01/05/15 1208  NA 138 137 138  K 4.0 3.8 4.0  CL 103 99 107  CO2 29 28 26   GLUCOSE 92 152* 96  BUN 11 11 11   CREATININE 1.16 1.26 1.09  CALCIUM 9.9 10.0 9.4  ALKPHOS 78  --   --   AST 24  --   --   ALT 10  --   --   ALBUMIN 3.8  --   --    ESR - 38 TSH - 1.513 B12 - 550  U Tox - negative  A1C - 4.7  Lipid Panel     Component Value Date/Time   CHOL 271* 01/04/2015 0855   TRIG 79 01/04/2015 0855   HDL 70 01/04/2015 0855   CHOLHDL 3.9 01/04/2015 0855   VLDL 16 01/04/2015 0855   LDLCALC 185* 01/04/2015 0855    CT Angio Head / Neck 3/1 -  ADDENDUM REPORT: 01/04/2015 10:12  ADDENDUM: Right upper lobe irregular nodule measures 8 mm. Left upper lobe sub solid nodule measures 18 mm. Neoplasm not excluded. Chest CT recommended for further  evaluation.   Electronically Signed  By: Franchot Gallo M.D.  On: 01/04/2015 10:12  IMPRESSION: Acute/subacute infarct right occipital lobe with mild enhancement. Chronic infarct left parietal lobe. 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  Moderate to severe stenosis right cavernous carotid and mild disease in the left cavernous carotid.  25% diameter stenosis proximal right internal carotid artery.  40% diameter stenosis proximal left internal carotid artery with a 2 mm ulcerated plaque in the carotid bulb on the left  Left vertebral artery is dominant widely patent. Right vertebral artery is small with a moderate stenosis distally.  Electronically Signed: By: Franchot Gallo M.D. On: 01/04/2015 08:08  Head CT 2/29 -  IMPRESSION: Acute/subacute infarct right occipital lobe with mild enhancement. Chronic infarct left parietal lobe. 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  Moderate to severe stenosis right cavernous carotid and mild disease in the left cavernous  carotid.  25% diameter stenosis proximal right internal carotid artery.  40% diameter stenosis proximal left internal carotid artery with a 2 mm ulcerated plaque in the carotid bulb on the left  Left vertebral artery is dominant widely patent. Right vertebral artery is small with a moderate stenosis distally.  Head CT without contrast: IMPRESSION: Age-indeterminate right occipital lobe infarct, favored to be subacute to chronic, although new from the prior study.  Chronic left occipital lobe infarct.  No evidence of acute intracranial abnormality.  MRI brain with/without contrast: IMPRESSION: Mixed age infarctions affecting both occipital lobes. Old infarction is present in the left occipital lobe.  There is also late subacute infarction in the left occipital lobe. Right occipital lobe shows what probably represent areas of acute and subacute infarction. Mixed age infarctions in both occipital lobes suggest recurring posterior circulation embolic events.   Results/Tests Pending at Time of Discharge: None  Discharge Medications:    Medication List    TAKE these medications        amLODipine 5 MG tablet  Commonly known as:  NORVASC  Take 1 tablet (5 mg total) by mouth daily.     aspirin 325 MG EC tablet  Take 1 tablet (325 mg total) by mouth daily.     atorvastatin 40 MG tablet  Commonly known as:  LIPITOR  Take 1 tablet (40 mg total) by mouth daily at 6 PM.     clopidogrel 75 MG tablet  Commonly known as:  PLAVIX  Take 1 tablet (75 mg total) by mouth daily.     oxyCODONE 5 MG immediate release tablet  Commonly known as:  Oxy IR/ROXICODONE  Take 1 tablet (5 mg total) by mouth every 4 (four) hours as needed for severe pain.        Discharge Instructions: Please refer to Patient Instructions section of EMR for full details.  Patient was counseled important signs and symptoms that should prompt return to medical care, changes in medications, dietary instructions, activity restrictions, and follow up appointments.   Follow-Up Appointments: Follow-up Information    Follow up with Xu,Jindong, MD. Schedule an appointment as soon as possible for a visit in 2 months.   Specialty:  Neurology   Why:  stroke clinic   Contact information:   [address] [place] [address] [phone]       Follow up with Sanda Klein, MD. Schedule an appointment as soon as possible for a visit in 3 days.   Specialty:  Cardiology   Why:  Wound site follow up.    Contact information:   [address] [place] [address] [phone]       Follow up with Thersa Salt, DO. Go on 01/10/2015.   Specialty:  Family Medicine   Why:  10:15 am. Hospital  Follow Up    Contact information:   [place] [address] [phone]       Aquilla Hacker, MD 01/06/2015, 1:42 PM PGY-1, [place]

## 2015-01-06 NOTE — Discharge Instructions (Signed)
Supplemental Discharge Instructions for  Pacemaker/Defibrillator Patients  Activity No restrictions.  WOUND CARE - Keep the wound area clean and dry.  Remove the dressing the day after you return home (usually 48 hours after the procedure). - DO NOT SUBMERGE UNDER WATER UNTIL FULLY HEALED (no tub baths, hot tubs, swimming pools, etc.).  - You  may shower or take a sponge bath after the dressing is removed. DO NOT SOAK the area and do not allow the shower to directly spray on the site. - If you have staples, these will be removed in the office in 7-14 days. - If you have tape/steri-strips on your wound, these will fall off; do not pull them off prematurely.   - No bandage is needed on the site.  DO  NOT apply any creams, oils, or ointments to the wound area. - If you notice any drainage or discharge from the wound, any swelling, excessive redness or bruising at the site, or if you develop a fever > 101? F after you are discharged home, call the office at once.  Special Instructions - You are still able to use cellular telephones.  Avoid carrying your cellular phone near your device. - When traveling through airports, show security personnel your identification card to avoid being screened in the metal detectors.  - Avoid arc welding equipment, MRI testing (magnetic resonance imaging), TENS units (transcutaneous nerve stimulators).  Call the office for questions about other devices. - Avoid electrical appliances that are in poor condition or are not properly grounded. - Microwave ovens are safe to be near or to operate.  Ischemic Stroke A stroke (cerebrovascular accident) is the sudden death of brain tissue. It is a medical emergency. A stroke can cause permanent loss of brain function. This can cause problems with different parts of your body. A transient ischemic attack (TIA) is different because it does not cause permanent damage. A TIA is a short-lived problem of poor blood flow affecting a  part of the brain. A TIA is also a serious problem because having a TIA greatly increases the chances of having a stroke. When symptoms first develop, you cannot know if the problem might be a stroke or a TIA. CAUSES  A stroke is caused by a decrease of oxygen supply to an area of your brain. It is usually the result of a small blood clot or collection of cholesterol or fat (plaque) that blocks blood flow in the brain. A stroke can also be caused by blocked or damaged carotid arteries.  RISK FACTORS  High blood pressure (hypertension).  High cholesterol.  Diabetes mellitus.  Heart disease.  The buildup of plaque in the blood vessels (peripheral artery disease or atherosclerosis).  The buildup of plaque in the blood vessels providing blood and oxygen to the brain (carotid artery stenosis).  An abnormal heart rhythm (atrial fibrillation).  Obesity.  Smoking.  Taking oral contraceptives (especially in combination with smoking).  Physical inactivity.  A diet high in fats, salt (sodium), and calories.  Alcohol use.  Use of illegal drugs (especially cocaine and methamphetamine).  Being African American.  Being over the age of 45.  Family history of stroke.  Previous history of blood clots, stroke, TIA, or heart attack.  Sickle cell disease. SYMPTOMS  These symptoms usually develop suddenly, or may be newly present upon awakening from sleep:  Sudden weakness or numbness of the face, arm, or leg, especially on one side of the body.  Sudden trouble walking or difficulty moving  arms or legs.  Sudden confusion.  Sudden personality changes.  Trouble speaking (aphasia) or understanding.  Difficulty swallowing.  Sudden trouble seeing in one or both eyes.  Double vision.  Dizziness.  Loss of balance or coordination.  Sudden severe headache with no known cause.  Trouble reading or writing. DIAGNOSIS  Your health care provider can often determine the presence or  absence of a stroke based on your symptoms, history, and physical exam. Computed tomography (CT) of the brain is usually performed to confirm the stroke, determine causes, and determine stroke severity. Other tests may be done to find the cause of the stroke. These tests may include:  Electrocardiography.  Continuous heart monitoring.  Echocardiography.  Carotid ultrasonography.  Magnetic resonance imaging (MRI).  A scan of the brain circulation.  Blood tests. PREVENTION  The risk of a stroke can be decreased by appropriately treating high blood pressure, high cholesterol, diabetes, heart disease, and obesity and by quitting smoking, limiting alcohol, and staying physically active. TREATMENT  Time is of the essence. It is important to seek treatment at the first sign of these symptoms because you may receive a medicine to dissolve the clot (thrombolytic) that cannot be given if too much time has passed since your symptoms began. Even if you do not know when your symptoms began, get treatment as soon as possible as there are other treatment options available including oxygen, intravenous (IV) fluids, and medicines to thin the blood (anticoagulants). Treatment of stroke depends on the duration, severity, and cause of your symptoms. Medicines and dietary changes may be used to address diabetes, high blood pressure, and other risk factors. Physical, speech, and occupational therapists will assess you and work with you to improve any functions impaired by the stroke. Measures will be taken to prevent short-term and long-term complications, including infection from breathing foreign material into the lungs (aspiration pneumonia), blood clots in the legs, bedsores, and falls. Rarely, surgery may be needed to remove large blood clots or to open up blocked arteries. HOME CARE INSTRUCTIONS   Take medicines only as directed by your health care provider. Follow the directions carefully. Medicines may be  used to control risk factors for a stroke. Be sure you understand all your medicine instructions.  You may be told to take a medicine to thin the blood, such as aspirin or the anticoagulant warfarin. Warfarin needs to be taken exactly as instructed.  Too much and too little warfarin are both dangerous. Too much warfarin increases the risk of bleeding. Too little warfarin continues to allow the risk for blood clots. While taking warfarin, you will need to have regular blood tests to measure your blood clotting time. These blood tests usually include both the PT and INR tests. The PT and INR results allow your health care provider to adjust your dose of warfarin. The dose can change for many reasons. It is critically important that you take warfarin exactly as prescribed, and that you have your PT and INR levels drawn exactly as directed.  Many foods, especially foods high in vitamin K, can interfere with warfarin and affect the PT and INR results. Foods high in vitamin K include spinach, kale, broccoli, cabbage, collard and turnip greens, brussels sprouts, peas, cauliflower, seaweed, and parsley, as well as beef and pork liver, green tea, and soybean oil. You should eat a consistent amount of foods high in vitamin K. Avoid major changes in your diet, or notify your health care provider before changing your diet. Arrange a  visit with a dietitian to answer your questions.  Many medicines can interfere with warfarin and affect the PT and INR results. You must tell your health care provider about any and all medicines you take. This includes all vitamins and supplements. Be especially cautious with aspirin and anti-inflammatory medicines. Do not take or discontinue any prescribed or over-the-counter medicine except on the advice of your health care provider or pharmacist.  Warfarin can have side effects, such as excessive bruising or bleeding. You will need to hold pressure over cuts for longer than usual. Your  health care provider or pharmacist will discuss other potential side effects.  Avoid sports or activities that may cause injury or bleeding.  Be mindful when shaving, flossing your teeth, or handling sharp objects.  Alcohol can change the body's ability to handle warfarin. It is best to avoid alcoholic drinks or consume only very small amounts while taking warfarin. Notify your health care provider if you change your alcohol intake.  Notify your dentist or other health care providers before procedures.  If swallow studies have determined that your swallowing reflex is present, you should eat healthy foods. Including 5 or more servings of fruits and vegetables a day may reduce the risk of stroke. Foods may need to be a certain consistency (soft or pureed), or small bites may need to be taken in order to avoid aspirating or choking. Certain dietary changes may be advised to address high blood pressure, high cholesterol, diabetes, or obesity.  Food choices that are low in sodium, saturated fat, trans fat, and cholesterol are recommended to manage high blood pressure.  Food choies that are high in fiber, and low in saturated fat, trans fat, and cholesterol may control cholesterol levels.  Controlling carbohydrates and sugar intake is recommended to manage diabetes.  Reducing calorie intake and making food choices that are low in sodium, saturated fat, trans fat, and cholesterol are recommended to manage obesity.  Maintain a healthy weight.  Stay physically active. It is recommended that you get at least 30 minutes of activity on all or most days.  Do not use any tobacco products including cigarettes, chewing tobacco, or electronic cigarettes.  Limit alcohol use even if you are not taking warfarin. Moderate alcohol use is considered to be:  No more than 2 drinks each day for men.  No more than 1 drink each day for nonpregnant women.  Home safety. A safe home environment is important to  reduce the risk of falls. Your health care provider may arrange for specialists to evaluate your home. Having grab bars in the bedroom and bathroom is often important. Your health care provider may arrange for equipment to be used at home, such as raised toilets and a seat for the shower.  Physical, occupational, and speech therapy. Ongoing therapy may be needed to maximize your recovery after a stroke. If you have been advised to use a walker or a cane, use it at all times. Be sure to keep your therapy appointments.  Follow all instructions for follow-up with your health care provider. This is very important. This includes any referrals, physical therapy, rehabilitation, and lab tests. Proper follow-up can prevent another stroke from occurring. SEEK MEDICAL CARE IF:  You have personality changes.  You have difficulty swallowing.  You are seeing double.  You have dizziness.  You have a fever.  You have skin breakdown. SEEK IMMEDIATE MEDICAL CARE IF:  Any of these symptoms may represent a serious problem that is an emergency.  Do not wait to see if the symptoms will go away. Get medical help right away. Call your local emergency services (911 in [place] Do not drive yourself to the hospital.  You have sudden weakness or numbness of the face, arm, or leg, especially on one side of the body.  You have sudden trouble walking or difficulty moving arms or legs.  You have sudden confusion.  You have trouble speaking (aphasia) or understanding.  You have sudden trouble seeing in one or both eyes.  You have a loss of balance or coordination.  You have a sudden, severe headache with no known cause.  You have new chest pain or an irregular heartbeat.  You have a partial or total loss of consciousness. Document Released: 10/22/2005 Document Revised: 03/08/2014 Document Reviewed: 06/01/2012 [facility] Patient Information 2015 [place], [place]. This information is not intended to replace advice  given to you by your health care provider. Make sure you discuss any questions you have with your health care provider.  We are glad that you are doing better.   It is very important that you go to your follow up appointments.   It is also very important that you take your medications exactly as prescribed.   You will need a repeat Chest CT scan in 3 months to follow up on the lung nodules.   Thanks for letting us take care of you!   Sincerely,  Devota Pace, MD Family Medicine - PGY 1

## 2015-01-06 NOTE — Progress Notes (Signed)
D/C orders received. Pt and family educated on d/c instructions and stroke education. Pt verbalized understanding. Pt handed d/c packet. IV and tele removed. Pt taken downstairs by staff via wheelchair.

## 2015-01-06 NOTE — Progress Notes (Signed)
PCP Note   As patient's PCP I stopped by to visit Ms. Flaugher. He is feeling well; appears ready for discharge soon. I will be happy to resume care at the hospital follow up appointment. I appreciate the efforts of the entire nursing and medical team involved in her care.   Mekhai Venuto B. Jarvis Newcomer, MD, PGY-2 01/06/2015 1:32 PM Pager: [phone]

## 2015-01-06 NOTE — Progress Notes (Signed)
Occupational Therapy Treatment Patient Details Name: Joe Durham MRN: [id number] DOB: [birth year] Today's Date: 01/06/2015    History of present illness Patient is a 69 y/o male admitted with vision changes and gait changes for 3 days. PMH is significant for COPD, HTN, ETOH abuse, and remote history of prostate enlargement. 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 CTA head/neck-severe proximal R PCA stenosis. 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 L ICA 40% stenosis w/ ulcerated plaque in bulb. S/p TEE with implanted recorder 3/2.   OT comments  Pt is able to perform BADLs with mod I.  He performed path finding activity with mod cues due to difficulty with problem solving.  Recommend HHOT   Follow Up Recommendations  Home health OT;Supervision/Assistance - 24 hour    Equipment Recommendations  None recommended by OT    Recommendations for Other Services      Precautions / Restrictions Precautions Precautions: Fall       Mobility Bed Mobility Overal bed mobility: Modified Independent                Transfers Overall transfer level: Modified independent                    Balance           Standing balance support: During functional activity Standing balance-Leahy Scale: Good                     ADL Overall ADL's : Modified independent                                       General ADL Comments: Pt is able to toss ball Lt to Rt hand while countin to 100's by 2s.  Min errors noted.  He was able to retrieve items from floor mod I without LOB       Vision                 Additional Comments: Pt able scan environment without cues this date    Perception     Praxis      Cognition   Behavior During Therapy: [facility] for tasks assessed/performed Overall Cognitive Status: Impaired/Different from baseline Area of Impairment: Problem solving                General Comments: Pt required  mod verbal cues for path finding activity.  Unsure of pt's baseline function and unsure how this will impact him at home as he reports he only goes places familiar to him,  and if he does venture into unknown places, he is with another person     Extremity/Trunk Assessment               Exercises     Shoulder Instructions       General Comments      Pertinent Vitals/ Pain       Pain Assessment: No/denies pain  Home Living                                          Prior Functioning/Environment              Frequency Min 2X/week     Progress Toward Goals  OT Goals(current goals  can now be found in the care plan section)  Progress towards OT goals: Progressing toward goals  ADL Goals Pt Will Perform Grooming: with modified independence;standing Pt Will Perform Upper Body Bathing: with modified independence;sitting Pt Will Perform Lower Body Bathing: with modified independence;sit to/from stand Pt Will Perform Upper Body Dressing: with modified independence;sitting Pt Will Perform Lower Body Dressing: with modified independence;sit to/from stand Pt Will Transfer to Toilet: with modified independence;ambulating;regular height toilet Pt Will Perform Toileting - Clothing Manipulation and hygiene: with modified independence;sit to/from stand Additional ADL Goal #1: Pt will visually scan environment to locate needed items with no cues.   Plan Discharge plan remains appropriate    Co-evaluation                 End of Session     Activity Tolerance Patient tolerated treatment well   Patient Left in bed;with bed alarm set   Nurse Communication          Time: 5790-3833 OT Time Calculation (min): 23 min  Charges: OT General Charges $OT Visit: 1 Procedure OT Treatments $Therapeutic Activity: 23-37 mins  Clarissa Laird M 01/06/2015, 4:36 PM

## 2015-01-06 NOTE — Care Management Note (Signed)
    Page 1 of 1   01/06/2015     10:55:42 AM CARE MANAGEMENT NOTE 01/06/2015  Patient:  Joe Durham, Joe Durham   Account Number:  [id number]  Date Initiated:  01/05/2015  Documentation initiated by:  Lorne Skeens  Subjective/Objective Assessment:   Patient was admitted with CVA. Lives at home alone.     Action/Plan:   Will follow for discharge needs pending PT/OT evals and physician orders.   Anticipated DC Date:  01/06/2015   Anticipated DC Plan:  [place]  CM consult      Choice offered to / List presented to:  [name]        [place] arranged  [place].   Status of service:  Completed, signed off Medicare Important Message given?  YES (If response is "NO", the following Medicare IM given date fields will be blank) Date Medicare IM given:  01/06/2015 Medicare IM given by:  Lorne Skeens Date Additional Medicare IM given:   Additional Medicare IM given by:    Discharge Disposition:  [name]  Per UR Regulation:  Reviewed for med. necessity/level of care/duration of stay  If discussed at [place] of Stay Meetings, dates discussed:    Comments:  01/06/15 [place], MSN, [place] with [facility] was notified that OT orders were added to the existing PT order for discharge home today.   01/05/15 [place], MSN, CM- Met with patient to discuss home health needs. Patient is agreeable and has chosen [facility]. [name] with [facility] was notified and has accepted the referral for discharge home tomorrow.

## 2015-01-06 NOTE — Evaluation (Addendum)
Speech Language Pathology Evaluation Patient Details Name: Joe Durham MRN: [id number] DOB: 04-22-1946 Today's Date: 01/06/2015 Time: 1010-1040 SLP Time Calculation (min) (ACUTE ONLY): 30 min  Problem List:  Patient Active Problem List   Diagnosis Date Noted  . Cerebral infarction due to embolism of right posterior cerebral artery   . Lung mass   . Right temporal headache   . Acute embolic stroke 01/04/2015  . Cerebral infarction due to unspecified mechanism   . Visual changes   . Substance abuse   . Hyperlipidemia   . CVA (cerebral infarction) 01/03/2015  . Tobacco abuse 04/12/2014  . Bladder stones 04/12/2014  . Right wrist pain 04/12/2014  . Left forearm fracture 03/21/2014  . Uncontrolled hypertension 03/20/2014  . Radius and ulna distal fracture 03/20/2014   Past Medical History:  Past Medical History  Diagnosis Date  . Wrist fracture, left     S/P  ORIF  03-20-2014--  has hard cast on  . Bladder calculi   . Fracture of nasal bone     03-20-2014   MILD COMMUNIUTED  . COPD (chronic obstructive pulmonary disease)   . Short of breath on exertion   . Hypertension   . Wears glasses   . Diplopia     PT STATES WHEN LYING DOWN HE SEES DOUPLE ,  WHEN HE SITS UP IT CLEARS UP   Past Surgical History:  Past Surgical History  Procedure Laterality Date  . Orif wrist fracture Left 03/20/2014    Procedure: OPEN REDUCTION INTERNAL FIXATION (ORIF) WRIST FRACTURE;  Surgeon: Dominica Severin, MD;  Location: [facility] OR;  Service: Orthopedics;  Laterality: Left;  . Inguinal hernia repair Right 2011  . Loop recorder implant N/A 01/05/2015    Procedure: LOOP RECORDER IMPLANT;  Surgeon: Thurmon Fair, MD;  Location: [facility] CATH LAB;  Service: Cardiovascular;  Laterality: N/A;  . Tee without cardioversion N/A 01/05/2015    Procedure: TRANSESOPHAGEAL ECHOCARDIOGRAM (TEE)/LOOP;  Surgeon: Thurmon Fair, MD;  Location: [facility] ENDOSCOPY;  Service: Cardiovascular;  Laterality: N/A;   HPI:  Joe Durham  is a 69 y.o. male admitted with vision and gait changes x 3 days. PMH is significant for COPD, HTN, ETOH abuse, and remote history of prostate enlargement. MRI shows mixed age infarctions affecting both occipital lobes. Old infarction is present in the left occipital lobe. There is also late subacute infarction in the left occipital lobe. Right occipital lobe shows what probably represent areas of acute and subacute infarction.Mixed age infarctions in both occipital lobes suggest recurring posterior circulation embolic events.   Assessment / Plan / Recommendation Clinical Impression  Pt demonstrates mild impairment in cognitive functioning involving his memory. Pt able to slightly improve function when given verbal cues, however inconsistent. Pt demonstrates adequate expressive, receptive language skills and clear vocal quality, however harsh at baseline. Discussed with pt compensatory strategies he could use to improve memory. Recommended pt seek outpatient services with a SLP. Speech will sign-off; pt discharging today.     SLP Assessment  Patient needs continued Speech Lanaguage Pathology Services    Follow Up Recommendations  Outpatient SLP    Frequency and Duration min 2x/week  2 weeks   Pertinent Vitals/Pain     SLP Goals  Potential to Achieve Goals (ACUTE ONLY): [place]  SLP Evaluation Prior Functioning  Available Help at Discharge: Family;Friend(s);Available PRN/intermittently   Cognition  Overall Cognitive Status: Impaired/Different from baseline Arousal/Alertness: Awake/alert Orientation Level: Oriented X4 Attention: Alternating Alternating Attention: Appears intact Memory: Impaired Memory Impairment: Storage deficit;Retrieval deficit Awareness:  Appears intact Executive Function: Reasoning Reasoning: Appears intact Safety/Judgment: Appears intact    Comprehension  Auditory Comprehension Overall Auditory Comprehension: Appears within functional limits for tasks  assessed Yes/No Questions: Within Functional Limits Commands: Within Functional Limits Conversation: Complex EffectiveTechniques: Repetition;Pausing;Slowed speech Visual Recognition/Discrimination Discrimination: Within Function Limits Reading Comprehension Reading Status: Impaired (Pt has vision problems. ) Word level: Unable to assess (comment) Sentence Level: Unable to assess (comment) Paragraph Level: Unable to assess (comment)    Expression Expression Primary Mode of Expression: Verbal Verbal Expression Overall Verbal Expression: Appears within functional limits for tasks assessed Initiation: No impairment Automatic Speech: Month of year;Counting Repetition: No impairment Naming: No impairment Pragmatics: No impairment Written Expression Dominant Hand: Right Written Expression: Not tested (Pt has vision problems)   Oral / Motor Oral Motor/Sensory Function Overall Oral Motor/Sensory Function: Appears within functional limits for tasks assessed Lingual ROM: Within Functional Limits Facial ROM: Within Functional Limits Motor Speech Overall Motor Speech: Appears within functional limits for tasks assessed   GO     Bermudez-Bosch, Jaysa Kise 01/06/2015, 11:39 AM

## 2015-01-10 ENCOUNTER — Ambulatory Visit (INDEPENDENT_AMBULATORY_CARE_PROVIDER_SITE_OTHER): Payer: Medicare Other | Admitting: Family Medicine

## 2015-01-10 ENCOUNTER — Encounter: Payer: Self-pay | Admitting: Family Medicine

## 2015-01-10 VITALS — BP 150/92 | HR 102 | Temp 98.6°F | Ht 68.0 in | Wt 130.0 lb

## 2015-01-10 DIAGNOSIS — Z09 Encounter for follow-up examination after completed treatment for conditions other than malignant neoplasm: Secondary | ICD-10-CM

## 2015-01-10 DIAGNOSIS — I639 Cerebral infarction, unspecified: Secondary | ICD-10-CM

## 2015-01-10 MED ORDER — ASPIRIN EC 81 MG PO TBEC: 81.0000 mg | DELAYED_RELEASE_TABLET | Freq: Every day | ORAL | Status: DC

## 2015-01-10 MED ORDER — AMLODIPINE BESYLATE 5 MG PO TABS: 10.0000 mg | ORAL_TABLET | Freq: Every day | ORAL | Status: DC

## 2015-01-10 NOTE — Progress Notes (Signed)
   Subjective:    Patient ID: Joe Durham, male    DOB: 07/30/1946, 69 y.o.   MRN: [id number]  HPI 69 year old male with a past medical history of uncontrolled hypertension, tobacco abuse and alcohol abuse presents for hospital follow-up.  1) Hospital follow up   Patient was admitted on 2/29 after experiencing new onset vision changes.  MRI was obtained and revealed occipital infarcts concerning for embolic event.  Patient did well during hospitalization. A loop recorder was placed given concern for arrhythmic event that led to possible embolic stroke.  He was placed on aspirin and Plavix. Patient was also placed on Lipitor 40 mg daily.  Of note, pulmonary nodules are found on CTA of head and neck.  It is recommended that he have follow-up CT in 3 months given appearance of foreign nodules as well as tobacco abuse.  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  Patient was assessed today for follow-up.  He states that he is doing well but he still having some difficulties with his vision.  No other complaints this time.  Endorses compliance with current medications but states that he was unsure if history taking both aspirin and Plavix.  Review of Systems Per HPI    Objective:   Physical Exam Filed Vitals:   01/10/15 1056  BP: 150/92  Pulse:   Temp:    Exam: General: Thin elderly gentleman in no acute distress. Cardiovascular: RRR. No murmurs, rubs, or gallops. Respiratory: CTAB. No rales, rhonchi, or wheeze. Neuro: Muscle strength 5/5 in all extremity. Sensation intact. PERRLA. No focal deficits.    Assessment & Plan:  See Problem List

## 2015-01-10 NOTE — Patient Instructions (Signed)
It was nice to see you today.  Take the Aspirin and Plavix for three months. Then take the plavix only.  Please be sure to follow up with Cardiology and Neurology.  Follow up with Dr. Jarvis Newcomer in ~ 1-3 months.  Take care  Dr. Adriana Simas

## 2015-01-10 NOTE — Assessment & Plan Note (Signed)
Patient doing well at this time. I encouraged him to take both aspirin and Plavix as indicated.  I change the dose of aspirin to 81 mg daily. Patient's blood pressure was elevated today. I increased his amlodipine to 10 mg daily. I encouraged alcohol tobacco cessation although patient does not appear to be motivated. Patient is to follow close with PCP for follow-up CT in approximately 3 months. Regarding his positive RPR, he endorses prior diagnosis and treatment of syphilis. No need for further workup of this time.

## 2015-01-17 ENCOUNTER — Ambulatory Visit (INDEPENDENT_AMBULATORY_CARE_PROVIDER_SITE_OTHER): Payer: Medicare Other | Admitting: *Deleted

## 2015-01-17 ENCOUNTER — Inpatient Hospital Stay: Payer: Medicare Other | Admitting: Family Medicine

## 2015-01-17 DIAGNOSIS — I639 Cerebral infarction, unspecified: Secondary | ICD-10-CM

## 2015-01-17 LAB — MDC_IDC_ENUM_SESS_TYPE_INCLINIC

## 2015-01-17 NOTE — Progress Notes (Signed)
Wound check s/p ILR implant. Wound well healed without redness or edema.  Pt with 0 tachy episodes; 0 brady episodes; 0 asystole; 0 AF episodes; 0 symptom episodes. Plan to continue Carelink f/u QMO and f/u w/ [facility] in 3 months.

## 2015-02-02 ENCOUNTER — Encounter: Payer: Self-pay | Admitting: Cardiovascular Disease

## 2015-02-04 ENCOUNTER — Encounter: Payer: Self-pay | Admitting: Cardiovascular Disease

## 2015-02-04 ENCOUNTER — Ambulatory Visit (INDEPENDENT_AMBULATORY_CARE_PROVIDER_SITE_OTHER): Payer: Medicare Other | Admitting: *Deleted

## 2015-02-04 DIAGNOSIS — I639 Cerebral infarction, unspecified: Secondary | ICD-10-CM | POA: Diagnosis not present

## 2015-02-04 LAB — MDC_IDC_ENUM_SESS_TYPE_REMOTE
Date Time Interrogation Session: 20160330171232
MDC IDC SET ZONE DETECTION INTERVAL: 2000 ms
Zone Setting Detection Interval: 3000 ms
Zone Setting Detection Interval: 370 ms

## 2015-02-08 NOTE — Progress Notes (Signed)
Loop recorder 

## 2015-02-22 ENCOUNTER — Encounter: Payer: Self-pay | Admitting: Cardiovascular Disease

## 2015-03-04 ENCOUNTER — Encounter: Payer: Self-pay | Admitting: Cardiovascular Disease

## 2015-03-07 ENCOUNTER — Encounter: Payer: Self-pay | Admitting: Cardiovascular Disease

## 2015-03-07 ENCOUNTER — Ambulatory Visit (INDEPENDENT_AMBULATORY_CARE_PROVIDER_SITE_OTHER): Payer: Medicare Other | Admitting: *Deleted

## 2015-03-07 DIAGNOSIS — I639 Cerebral infarction, unspecified: Secondary | ICD-10-CM

## 2015-03-09 NOTE — Progress Notes (Signed)
Loop recorder 

## 2015-03-17 ENCOUNTER — Encounter: Payer: Self-pay | Admitting: Cardiovascular Disease

## 2015-03-23 LAB — CUP PACEART REMOTE DEVICE CHECK: MDC IDC SESS DTM: 20160504040500

## 2015-03-24 ENCOUNTER — Encounter: Payer: Self-pay | Admitting: Cardiology

## 2015-03-31 ENCOUNTER — Encounter: Payer: Self-pay | Admitting: Cardiology

## 2015-04-05 ENCOUNTER — Ambulatory Visit (INDEPENDENT_AMBULATORY_CARE_PROVIDER_SITE_OTHER): Payer: Medicare Other | Admitting: *Deleted

## 2015-04-05 ENCOUNTER — Ambulatory Visit (INDEPENDENT_AMBULATORY_CARE_PROVIDER_SITE_OTHER): Payer: Medicare Other | Admitting: Neurology

## 2015-04-05 ENCOUNTER — Encounter: Payer: Self-pay | Admitting: Neurology

## 2015-04-05 VITALS — BP 145/96 | HR 84 | Ht 68.5 in | Wt 127.0 lb

## 2015-04-05 DIAGNOSIS — I10 Essential (primary) hypertension: Secondary | ICD-10-CM | POA: Diagnosis not present

## 2015-04-05 DIAGNOSIS — I6340 Cerebral infarction due to embolism of unspecified cerebral artery: Secondary | ICD-10-CM

## 2015-04-05 DIAGNOSIS — I639 Cerebral infarction, unspecified: Secondary | ICD-10-CM

## 2015-04-05 DIAGNOSIS — I63431 Cerebral infarction due to embolism of right posterior cerebral artery: Secondary | ICD-10-CM

## 2015-04-05 DIAGNOSIS — Z720 Tobacco use: Secondary | ICD-10-CM

## 2015-04-05 DIAGNOSIS — F17200 Nicotine dependence, unspecified, uncomplicated: Secondary | ICD-10-CM

## 2015-04-05 DIAGNOSIS — F1410 Cocaine abuse, uncomplicated: Secondary | ICD-10-CM | POA: Diagnosis not present

## 2015-04-05 NOTE — Progress Notes (Signed)
STROKE NEUROLOGY FOLLOW UP NOTE  NAME: Joe Durham DOB: 07/30/1946  REASON FOR VISIT: stroke follow up HISTORY FROM: chart and pt  Today we had the pleasure of seeing Anmol Viens in follow-up at our Neurology Clinic. Pt was accompanied by mom.   History Summary 69 year old male with history of hypertension, cocaine abuse, current smoker, noncompliant with medication and not seeking medical attention was admitted to [facility] on 01/03/15 for 3 day history of intermittent changes in vision, gait instability and right sided headache over right temporal area. 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 CTA showed multiple large vessel atherosclerosis, including right PCA and b/l cavernous ICAs. TEE negative and no DVT. Loop recorder was placed. His stroke was felt to be most likely due to large vessel atherosclerosis, however embolic stroke not able to completely ruled out. High LDL at 185, Lipitor added. He was put on dural antiplatelet. He was discharged in good condition with only patchy left lower quadrant visual field deficit.  Interval History During the interval time, the patient has been doing well. No recurrent symptoms. He has been on dural antiplatelet for 3 months now. No side effect. He is also on norvasc 10mg  daily and today BP 145/96. His loop recorder on 02/04/15 check showed 29 AF episodes, but none of them clearly shows AF, likely due to artifact. He did not get any call from cardiology. He is still smoking and not quit yet. His HIV negative but RPR was positive. However, he stated that his syphilis was treated in the past.   REVIEW OF SYSTEMS: Full 14 system review of systems performed and notable only for those listed below and in HPI above, all others are negative:  Constitutional:   Cardiovascular:  Ear/Nose/Throat:   Skin:  Eyes:  Blurry vision Respiratory:   Gastroitestinal:   Genitourinary:  Hematology/Lymphatic:   Endocrine:  Musculoskeletal:     Allergy/Immunology:   Neurological:   Psychiatric:  Sleep:   The following represents the patient's updated allergies and side effects list: No Known Allergies  The neurologically relevant items on the patient's problem list were reviewed on today's visit.  Neurologic Examination  A problem focused neurological exam (12 or more points of the single system neurologic examination, vital signs counts as 1 point, cranial nerves count for 8 points) was performed.  Blood pressure 145/96, pulse 84, height 5' 8.5" (1.74 m), weight 127 lb (57.607 kg).  General - Well nourished, well developed, in no apparent distress.  Ophthalmologic - Sharp disc margins OU. Fundi not visualized due to .  Cardiovascular - Regular rate and rhythm with no murmur.  Mental Status -  Level of arousal and orientation to time, place, and person were intact. Language including expression, naming, repetition, comprehension was assessed and found intact. Attention span and concentration were normal. Recent and remote memory were intact. Fund of Knowledge was assessed and was intact.  Cranial Nerves II - XII - II - Visual field intact OU. III, IV, VI - Extraocular movements intact. V - Facial sensation intact bilaterally. VII - Facial movement intact bilaterally. VIII - Hearing & vestibular intact bilaterally. X - Palate elevates symmetrically. XI - Chin turning & shoulder shrug intact bilaterally. XII - Tongue protrusion intact.  Motor Strength - The patient's strength was normal in all extremities and pronator drift was absent.  Bulk was normal and fasciculations were absent.   Motor Tone - Muscle tone was assessed at the neck and appendages and was normal.  Reflexes - The patient's reflexes were 1+ in all extremities and he had no pathological reflexes.  Sensory - Light touch, temperature/pinprick were assessed and were normal.    Coordination - The patient had normal movements in the hands and feet  with no ataxia or dysmetria.  Tremor was absent.  Gait and Station - The patient's transfers, posture, gait, station, and turns were observed as normal.  Data reviewed: I personally reviewed the images and agree with the radiology interpretations.  Ct Head Wo Contrast 01/03/2015 Age-indeterminate right occipital lobe infarct, favored to be subacute to chronic, although new from the prior study. Chronic left occipital lobe infarct. No evidence of acute intracranial abnormality.   Ct Angio Head & Neck W/cm &/or Wo/cm 01/04/2015 Acute/subacute infarct right occipital lobe with mild enhancement. Chronic infarct left parietal lobe. No intracranial hemorrhage Severe stenosis right proximal posterior cerebral artery responsible for the right occipital lobe infarct. Moderate to severe stenosis right cavernous carotid and mild disease in the left cavernous carotid. 25% diameter stenosis proximal right internal carotid artery. 40% diameter stenosis proximal left internal carotid artery with a 2 mm ulcerated plaque in the carotid bulb on the left Left vertebral artery is dominant widely patent. Right vertebral artery is small with a moderate stenosis distally.   Mr Laqueta Jean Wo Contrast 01/03/2015 Mixed age infarctions affecting both occipital lobes. Old infarction is present in the left occipital lobe. There is also late subacute infarction in the left occipital lobe. Right occipital lobe shows what probably represent areas of acute and subacute infarction. Mixed age infarctions in both occipital lobes suggest recurring posterior circulation embolic events.   LE venous doppler - negative for DVT  2D Echocardiogram EF 60% with no source of embolus.   TEE - no cardiac source of emboli  Component     Latest Ref Rng 01/04/2015 01/05/2015  Cholesterol     0 - 200 mg/dL 161 (H)   Triglycerides     <150 mg/dL 79   HDL Cholesterol     >39 mg/dL 70   Total CHOL/HDL Ratio      3.9   VLDL      0 - 40 mg/dL 16   LDL (calc)     0 - 99 mg/dL 096 (H)   Hemoglobin E4V     4.8 - 5.6 % 4.7 (L)   Mean Plasma Glucose      88   Rapid Plasma Reagin, Quant     NonRea<1:1  1:2 (H)  T Pallidum Abs     Negative  Positive (A)  TSH     0.350 - 4.500 uIU/mL 1.513   Vitamin B-12     211 - 911 pg/mL 550   RPR     Non Reactive  Reactive (A)  HIV Screen 4th Generation wRfx     Non Reactive  Non Reactive    Assessment: As you may recall, he is a 68 y.o. African American male with PMH of hypertension, current smoker, cocaine abuse, noncompliant with medication and not seeking medical attention was admitted for right PCA acute stroke. 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 CTA showed multiple large vessel atherosclerosis. TEE negative. Loop recorder placed. His stroke most likely due to large vessel atherosclerosis, however embolic stroke not able to completely ruled out.  His LDL is high at 185, Lipitor  daily added. He was put on dural antiplatelet and discharged home. Loop recorder so far no clear afib. His symptoms are resolved. He has  been on dural antiplatelet for about 3 months now and will switch to monotherapy.  Plan:  - discontinue ASA - continue plavix and lipitor for stroke prevention - check BP at home - Follow up with your primary care physician for stroke risk factor modification. Recommend maintain blood pressure goal <130/80, diabetes with hemoglobin A1c goal below 6.5% and lipids with LDL cholesterol goal below 70 mg/dL.  - follow up with Dr. Royann Shivers regarding loop recorder. - RTC in 3 months.  No orders of the defined types were placed in this encounter.    No orders of the defined types were placed in this encounter.    Patient Instructions  - you can stop baby ASA now - continue plavix and lipitor for stroke prevention - check BP at home - Follow up with your primary care physician for stroke risk factor modification. Recommend maintain blood pressure goal  <130/80, diabetes with hemoglobin A1c goal below 6.5% and lipids with LDL cholesterol goal below 70 mg/dL.  - follow up with Dr. Royann Shivers regarding loop recorder - follow up in 3 months.    Marvel Plan, MD PhD [facility] [address] [place], [place] [address] [phone]

## 2015-04-05 NOTE — Patient Instructions (Signed)
-   you can stop baby ASA now - continue plavix and lipitor for stroke prevention - check BP at home - Follow up with your primary care physician for stroke risk factor modification. Recommend maintain blood pressure goal <130/80, diabetes with hemoglobin A1c goal below 6.5% and lipids with LDL cholesterol goal below 70 mg/dL.  - follow up with Dr. Royann Shivers regarding loop recorder - follow up in 3 months.

## 2015-04-08 NOTE — Progress Notes (Signed)
Loop recorder 

## 2015-04-12 LAB — CUP PACEART REMOTE DEVICE CHECK: MDC IDC SESS DTM: 20160528040500

## 2015-04-18 ENCOUNTER — Encounter: Payer: Medicare Other | Admitting: Cardiovascular Disease

## 2015-04-19 ENCOUNTER — Encounter: Payer: Self-pay | Admitting: Cardiovascular Disease

## 2015-04-19 ENCOUNTER — Encounter: Payer: Medicare Other | Admitting: Cardiovascular Disease

## 2015-04-29 ENCOUNTER — Encounter: Payer: Self-pay | Admitting: Cardiovascular Disease

## 2015-05-05 ENCOUNTER — Ambulatory Visit (INDEPENDENT_AMBULATORY_CARE_PROVIDER_SITE_OTHER): Payer: Medicare Other | Admitting: *Deleted

## 2015-05-05 DIAGNOSIS — I639 Cerebral infarction, unspecified: Secondary | ICD-10-CM | POA: Diagnosis not present

## 2015-05-05 NOTE — Progress Notes (Signed)
Loop recorder 

## 2015-05-12 LAB — CUP PACEART REMOTE DEVICE CHECK: MDC IDC SESS DTM: 20160707150121

## 2015-05-26 ENCOUNTER — Encounter: Payer: Self-pay | Admitting: Cardiovascular Disease

## 2015-06-03 ENCOUNTER — Ambulatory Visit (INDEPENDENT_AMBULATORY_CARE_PROVIDER_SITE_OTHER): Payer: Medicare Other | Admitting: *Deleted

## 2015-06-03 DIAGNOSIS — I639 Cerebral infarction, unspecified: Secondary | ICD-10-CM

## 2015-06-08 NOTE — Progress Notes (Signed)
Loop recorder 

## 2015-06-16 DIAGNOSIS — Z4509 Encounter for adjustment and management of other cardiac device: Secondary | ICD-10-CM | POA: Diagnosis not present

## 2015-06-16 DIAGNOSIS — I440 Atrioventricular block, first degree: Secondary | ICD-10-CM | POA: Diagnosis not present

## 2015-06-16 DIAGNOSIS — E785 Hyperlipidemia, unspecified: Secondary | ICD-10-CM | POA: Diagnosis not present

## 2015-06-20 LAB — CUP PACEART REMOTE DEVICE CHECK: MDC IDC SESS DTM: 20160804040500

## 2015-06-22 ENCOUNTER — Telehealth: Payer: Self-pay | Admitting: *Deleted

## 2015-06-22 NOTE — Telephone Encounter (Signed)
Called patient to schedule follow-up appointment, patient states he cannot have morning appointments because his mother drives him to his appointments.  Patient asked me to call his mother, Ms. Laural Benes, to make appointment and that she will "make sure I'm there".  Called Ms. Johnson and set up appointment for earliest available afternoon appointment, 07/20/15 at 2:00pm.  Will route message to Dr. Royann Shivers to ensure patient does not need to be seen sooner.

## 2015-06-22 NOTE — Telephone Encounter (Signed)
Called patient regarding brady episode from 05/13/15 seen on LINQ transmission.  Patient asymptomatic, states he is usually still asleep at this time.  Patient overdue for follow-up appointment with Dr. Royann Shivers (due in 04/2015), but is agreeable to scheduling appointment.  Will call patient back with available appointment times.  Encouraged patient to call with dizziness or other symptoms.  Patient voices understanding and is appreciative of call.

## 2015-06-23 NOTE — Telephone Encounter (Signed)
Can someone please scan the strip with CHB in? I would like to see it before deciding. If necessary, I can see him tomorrow morning. MCr

## 2015-06-23 NOTE — Telephone Encounter (Signed)
Reviewed the strip. Cause was very brief and occurred while he was asleep. QRS complex remained narrow throughout. I don't think there is enough there to justify pacemaker implantation at this point. Okay to wait until we can make him an afternoon appointment as he requested

## 2015-06-24 ENCOUNTER — Encounter: Payer: Medicare Other | Admitting: Cardiovascular Disease

## 2015-06-29 ENCOUNTER — Ambulatory Visit: Payer: Medicare Other | Admitting: Neurology

## 2015-06-30 ENCOUNTER — Encounter: Payer: Self-pay | Admitting: Cardiovascular Disease

## 2015-06-30 ENCOUNTER — Encounter: Payer: Self-pay | Admitting: Neurology

## 2015-07-04 ENCOUNTER — Ambulatory Visit (INDEPENDENT_AMBULATORY_CARE_PROVIDER_SITE_OTHER): Payer: Medicare Other | Admitting: *Deleted

## 2015-07-04 DIAGNOSIS — I639 Cerebral infarction, unspecified: Secondary | ICD-10-CM | POA: Diagnosis not present

## 2015-07-06 NOTE — Progress Notes (Signed)
Loop recorder 

## 2015-07-12 ENCOUNTER — Ambulatory Visit: Payer: Medicare Other | Admitting: Neurology

## 2015-07-19 ENCOUNTER — Encounter: Payer: Self-pay | Admitting: Cardiovascular Disease

## 2015-07-19 ENCOUNTER — Ambulatory Visit (INDEPENDENT_AMBULATORY_CARE_PROVIDER_SITE_OTHER): Payer: Medicare Other | Admitting: Cardiovascular Disease

## 2015-07-19 VITALS — BP 138/86 | HR 85 | Ht 67.0 in | Wt 123.3 lb

## 2015-07-19 DIAGNOSIS — Z79899 Other long term (current) drug therapy: Secondary | ICD-10-CM

## 2015-07-19 DIAGNOSIS — I4719 Other supraventricular tachycardia: Secondary | ICD-10-CM | POA: Insufficient documentation

## 2015-07-19 DIAGNOSIS — I10 Essential (primary) hypertension: Secondary | ICD-10-CM | POA: Diagnosis not present

## 2015-07-19 DIAGNOSIS — Z4509 Encounter for adjustment and management of other cardiac device: Secondary | ICD-10-CM | POA: Diagnosis not present

## 2015-07-19 DIAGNOSIS — I639 Cerebral infarction, unspecified: Secondary | ICD-10-CM | POA: Diagnosis not present

## 2015-07-19 DIAGNOSIS — I440 Atrioventricular block, first degree: Secondary | ICD-10-CM

## 2015-07-19 DIAGNOSIS — E785 Hyperlipidemia, unspecified: Secondary | ICD-10-CM

## 2015-07-19 DIAGNOSIS — I471 Supraventricular tachycardia: Secondary | ICD-10-CM | POA: Insufficient documentation

## 2015-07-19 MED ORDER — AMLODIPINE BESYLATE 10 MG PO TABS: 10.0000 mg | ORAL_TABLET | Freq: Every day | ORAL | Status: DC

## 2015-07-19 NOTE — Progress Notes (Signed)
Patient ID: Joe Durham, male   DOB: 13-Jan-1946, 69 y.o.   MRN: [id number]      Cardiology Office Note   Date:  07/19/2015   ID:  Joe Durham, DOB 1946-08-31, MRN [id number]  PCP:  Hazeline Junker, MD  Cardiologist:   Thurmon Fair, MD   Chief Complaint  Patient presents with  . Hospitalization Follow-up    stroke  . Chest Pain    pt stated that the area where is Loop recorder is swells up and go done very often which cause sharp pain in chest      History of Present Illness: Joe Durham is a 69 y.o. male who presents for a loop recorder check. He presented with an acute stroke due to embolism of the right posterior cerebral artery Feb 2016. It was felt most likely that his stroke was due to large vessel atherosclerosis, but a cardioembolic stroke could not be excluded. His device has not shown evidence of atrial fibrillation although he has relatively frequent episodes of paroxysmal atrial tachycardia. In addition has a very long PR interval (300 ms), although higher grade AV block has not been documented. He has a incomplete left lower quadrant visual field defect.  Interrogation of his device shows numerous recorded events. Some of these are clearly artifactual related to T-wave oversensing or noise. Others are related to premature atrial contractions. Others are due to paroxysmal atrial tachycardia with one-to-one AV conduction with a cycle length that is usually around 350 ms. These are not symptomatic, but have lasted for several minutes at a time. On 03/17/2015 had a 6 second episode of sustained wide complex tachycardia that most likely represents rate related aberrancy, although nonsustained ventricular tachycardia cannot be fully excluded. None of these events appear consistent with true atrial fibrillation.  The cardia or high-grade AV block has not been documented.  Past Medical History  Diagnosis Date  . Wrist fracture, left     S/P  ORIF  03-20-2014--  has hard cast on   . Bladder calculi   . Fracture of nasal bone     03-20-2014   MILD COMMUNIUTED  . COPD (chronic obstructive pulmonary disease)   . Short of breath on exertion   . Hypertension   . Wears glasses   . Diplopia     PT STATES WHEN LYING DOWN HE SEES DOUPLE ,  WHEN HE SITS UP IT CLEARS UP    Past Surgical History  Procedure Laterality Date  . Orif wrist fracture Left 03/20/2014    Procedure: OPEN REDUCTION INTERNAL FIXATION (ORIF) WRIST FRACTURE;  Surgeon: Dominica Severin, MD;  Location: [facility] OR;  Service: Orthopedics;  Laterality: Left;  . Inguinal hernia repair Right 2011  . Loop recorder implant N/A 01/05/2015    Procedure: LOOP RECORDER IMPLANT;  Surgeon: Thurmon Fair, MD;  Location: [facility] CATH LAB;  Service: Cardiovascular;  Laterality: N/A;  . Tee without cardioversion N/A 01/05/2015    Procedure: TRANSESOPHAGEAL ECHOCARDIOGRAM (TEE)/LOOP;  Surgeon: Thurmon Fair, MD;  Location: [facility] ENDOSCOPY;  Service: Cardiovascular;  Laterality: N/A;     Current Outpatient Prescriptions  Medication Sig Dispense Refill  . amLODipine (NORVASC) 10 MG tablet Take 1 tablet (10 mg total) by mouth daily. 30 tablet 11  . atorvastatin (LIPITOR) 40 MG tablet Take 1 tablet (40 mg total) by mouth daily at 6 PM. 30 tablet 4  . clopidogrel (PLAVIX) 75 MG tablet Take 1 tablet (75 mg total) by mouth daily. 30 tablet 2   No current facility-administered medications for  this visit.    Allergies:   Review of patient's allergies indicates no known allergies.    Social History:  The patient  reports that he has been smoking Cigarettes.  He has a 13 pack-year smoking history. He has never used smokeless tobacco. He reports that he drinks alcohol. He reports that he does not use illicit drugs.   Family History:  The patient's family history includes Stroke in his mother and sister.    ROS:  Please see the history of present illness.    Otherwise, review of systems positive for none.   All other systems are reviewed  and negative.    PHYSICAL EXAM: VS:  BP 138/86 mmHg  Pulse 85  Ht 5\' 7"  (1.702 m)  Wt 123 lb 4.8 oz (55.929 kg)  BMI 19.31 kg/m2 , BMI Body mass index is 19.31 kg/(m^2).  General: Alert, oriented x3, no distress Head: no evidence of trauma, PERRL, EOMI, no exophtalmos or lid lag, no myxedema, no xanthelasma; normal ears, nose and oropharynx Neck: normal jugular venous pulsations and no hepatojugular reflux; brisk carotid pulses without delay and no carotid bruits Chest: clear to auscultation, no signs of consolidation by percussion or palpation, normal fremitus, symmetrical and full respiratory excursions. Recorder site appears healthy. He is very slender and the device pivots easily on top of one of his ribs, which may explain the artifact is sometimes recorded Cardiovascular: normal position and quality of the apical impulse, regular rhythm, normal first and second heart sounds, no murmurs, rubs or gallops Abdomen: no tenderness or distention, no masses by palpation, no abnormal pulsatility or arterial bruits, normal bowel sounds, no hepatosplenomegaly Extremities: no clubbing, cyanosis or edema; 2+ radial, ulnar and brachial pulses bilaterally; 2+ right femoral, posterior tibial and dorsalis pedis pulses; 2+ left femoral, posterior tibial and dorsalis pedis pulses; no subclavian or femoral bruits Neurological: grossly nonfocal Psych: euthymic mood, full affect   EKG:  EKG is ordered today. The ekg ordered today demonstrates sinus rhythm with very long first-degree AV block at 302 ms, QTC 442 ms   Recent Labs: 01/03/2015: ALT 10 01/04/2015: Hemoglobin 14.0; Platelets 263; TSH 1.513 01/05/2015: BUN 11; Creatinine, Ser 1.09; Potassium 4.0; Sodium 138    Lipid Panel    Component Value Date/Time   CHOL 271* 01/04/2015 0855   TRIG 79 01/04/2015 0855   HDL 70 01/04/2015 0855   CHOLHDL 3.9 01/04/2015 0855   VLDL 16 01/04/2015 0855   LDLCALC 185* 01/04/2015 0855      Wt Readings from  Last 3 Encounters:  07/19/15 123 lb 4.8 oz (55.929 kg)  04/05/15 127 lb (57.607 kg)  01/10/15 130 lb (58.968 kg)     ASSESSMENT AND PLAN:  1. Recurrent paroxysmal atrial tachycardia. This is asymptomatic and relatively brief. I would avoid treating it due to the severity of his first-degree AV block.  2. Hypertension, well controlled  3. Hyperlipidemia, time to recheck lipid profile on statin therapy  4. Serous circulation stroke with residual visual field defect, likely due to large vessel atherosclerosis, cannot exclude cardio embolic stroke  5. Implantable loop recorder without evidence of paroxysmal atrial fibrillation to date.   Current medicines are reviewed at length with the patient today.  The patient does not have concerns regarding medicines.  The following changes have been made:  no change  Labs/ tests ordered today include:  Orders Placed This Encounter  Procedures  . Comprehensive metabolic panel  . Lipid panel     Patient Instructions  A new Rx has been sent in for your Amlodipine 10 mg tablets take one daily.  Do a transmission from home from your Loop Recorder monthly.  Dr. Royann Shivers recommends that you schedule a follow-up appointment in: ONE YEAR       SignedThurmon Fair, MD  07/19/2015 2:27 PM    Thurmon Fair, MD, [facility] [phone] office [phone] pager

## 2015-07-19 NOTE — Patient Instructions (Signed)
A new Rx has been sent in for your Amlodipine 10 mg tablets take one daily.  Do a transmission from home from your Loop Recorder monthly.  Dr. Royann Shivers recommends that you schedule a follow-up appointment in: ONE YEAR

## 2015-07-20 ENCOUNTER — Encounter: Payer: Medicare Other | Admitting: Cardiovascular Disease

## 2015-08-03 ENCOUNTER — Ambulatory Visit (INDEPENDENT_AMBULATORY_CARE_PROVIDER_SITE_OTHER): Payer: Medicare Other | Admitting: *Deleted

## 2015-08-03 DIAGNOSIS — I639 Cerebral infarction, unspecified: Secondary | ICD-10-CM | POA: Diagnosis not present

## 2015-08-03 LAB — CUP PACEART REMOTE DEVICE CHECK: MDC IDC SESS DTM: 20160829173904

## 2015-08-03 NOTE — Progress Notes (Signed)
Carelink summary report received. Battery status OK. Normal device function. No new symptom, brady, pause, or AF episodes, +Plavix. 4 tachy episodes--available EGMs show T-wave oversensing. Monthly summary reports and ROV with [facility] on 07/19/15 at 10:15am.

## 2015-08-04 NOTE — Progress Notes (Signed)
Loop recorder 

## 2015-08-07 LAB — CUP PACEART INCLINIC DEVICE CHECK
Date Time Interrogation Session: 20160913141421
MDC IDC SET ZONE DETECTION INTERVAL: 370 ms
Zone Setting Detection Interval: 2000 ms
Zone Setting Detection Interval: 3000 ms

## 2015-08-12 ENCOUNTER — Encounter: Payer: Self-pay | Admitting: *Deleted

## 2015-08-12 NOTE — Progress Notes (Signed)
AF episode from 08/11/15 printed for review.  Reviewed by Dr. Royann Shivers, AT vs. AF, duration 2 min, +Plavix.  No additional recommendations at this time.  Monthly summary reports and ROV with Dr. Royann Shivers in 07/2016.

## 2015-08-16 ENCOUNTER — Encounter: Payer: Self-pay | Admitting: Cardiovascular Disease

## 2015-08-22 ENCOUNTER — Encounter: Payer: Self-pay | Admitting: Cardiovascular Disease

## 2015-08-29 LAB — CUP PACEART REMOTE DEVICE CHECK: Date Time Interrogation Session: 20160928180728

## 2015-08-29 NOTE — Progress Notes (Signed)
Carelink summary report received. Battery status OK. Normal device function. No new symptom, brady, pause, or AF episodes, +Plavix. Presenting ECG suggests Mobitz I HB, nocturnal. 3 tachy episodes--T-wave oversensing. Monthly summary reports and ROV with [facility] in 07/2016.

## 2015-09-02 ENCOUNTER — Ambulatory Visit (INDEPENDENT_AMBULATORY_CARE_PROVIDER_SITE_OTHER): Payer: Medicare Other | Admitting: *Deleted

## 2015-09-02 DIAGNOSIS — I6389 Other cerebral infarction: Secondary | ICD-10-CM

## 2015-09-02 DIAGNOSIS — I638 Other cerebral infarction: Secondary | ICD-10-CM

## 2015-09-02 NOTE — Progress Notes (Signed)
Loop recorder 

## 2015-09-13 ENCOUNTER — Encounter: Payer: Self-pay | Admitting: Cardiovascular Disease

## 2015-09-27 ENCOUNTER — Telehealth: Payer: Self-pay | Admitting: *Deleted

## 2015-09-27 NOTE — Telephone Encounter (Signed)
Called patient back to let him know that his transmission was successfully received and that it would be reviewed by Dr. Royann Shivers.  Patient verbalized appreciation and denies questions at this time.

## 2015-09-27 NOTE — Telephone Encounter (Signed)
Called patient to request that he send a manual Carelink transmission.  Walked patient through process and explained that I will call him back when transmission is received.  He verbalizes understanding and denies questions at this time.

## 2015-09-30 LAB — CUP PACEART REMOTE DEVICE CHECK: Date Time Interrogation Session: 20161028180721

## 2015-09-30 NOTE — Progress Notes (Signed)
Carelink summary report received. Battery status OK. Normal device function. No new symptom, brady, or pause episodes. 4 tachy episodes, ?AT. 7 AF episodes (burden 0.0%), +Plavix, available ECGs suggest SR w/PACs and AT, some ECGs indeterminate due to artifact. Monthly summary reports and ROV with [facility] in 07/2016.

## 2015-10-03 ENCOUNTER — Ambulatory Visit (INDEPENDENT_AMBULATORY_CARE_PROVIDER_SITE_OTHER): Payer: Medicare Other | Admitting: *Deleted

## 2015-10-03 DIAGNOSIS — I471 Supraventricular tachycardia: Secondary | ICD-10-CM

## 2015-10-03 DIAGNOSIS — I6389 Other cerebral infarction: Secondary | ICD-10-CM

## 2015-10-03 DIAGNOSIS — I638 Other cerebral infarction: Secondary | ICD-10-CM | POA: Diagnosis not present

## 2015-10-03 LAB — CUP PACEART REMOTE DEVICE CHECK: MDC IDC SESS DTM: 20161127180712

## 2015-10-06 ENCOUNTER — Encounter: Payer: Self-pay | Admitting: Cardiovascular Disease

## 2015-10-13 ENCOUNTER — Encounter: Payer: Self-pay | Admitting: Cardiovascular Disease

## 2015-11-01 ENCOUNTER — Ambulatory Visit (INDEPENDENT_AMBULATORY_CARE_PROVIDER_SITE_OTHER): Payer: Medicare Other | Admitting: *Deleted

## 2015-11-01 DIAGNOSIS — I638 Other cerebral infarction: Secondary | ICD-10-CM | POA: Diagnosis not present

## 2015-11-01 DIAGNOSIS — I6389 Other cerebral infarction: Secondary | ICD-10-CM

## 2015-11-02 NOTE — Progress Notes (Signed)
Carelink Summary Report / Loop Recorder 

## 2015-11-13 NOTE — Progress Notes (Signed)
Loop recorder 

## 2015-12-01 ENCOUNTER — Ambulatory Visit (INDEPENDENT_AMBULATORY_CARE_PROVIDER_SITE_OTHER): Payer: Medicare Other | Admitting: *Deleted

## 2015-12-01 DIAGNOSIS — I6389 Other cerebral infarction: Secondary | ICD-10-CM

## 2015-12-01 DIAGNOSIS — I638 Other cerebral infarction: Secondary | ICD-10-CM | POA: Diagnosis not present

## 2015-12-02 NOTE — Progress Notes (Signed)
Carelink Summary Report / Loop Recorder 

## 2015-12-10 LAB — CUP PACEART REMOTE DEVICE CHECK: Date Time Interrogation Session: 20161221050500

## 2015-12-29 ENCOUNTER — Telehealth: Payer: Self-pay | Admitting: *Deleted

## 2015-12-29 NOTE — Telephone Encounter (Signed)
Called patient to offer flu vaccine, however, VM picked up.  Left message on patient's voice mail to return call. DUCATTE, LAURENZE L, RN   

## 2015-12-30 ENCOUNTER — Encounter: Payer: Self-pay | Admitting: Cardiovascular Disease

## 2016-01-02 ENCOUNTER — Telehealth: Payer: Self-pay | Admitting: *Deleted

## 2016-01-02 ENCOUNTER — Ambulatory Visit (INDEPENDENT_AMBULATORY_CARE_PROVIDER_SITE_OTHER): Payer: Medicare Other | Admitting: *Deleted

## 2016-01-02 DIAGNOSIS — I6389 Other cerebral infarction: Secondary | ICD-10-CM

## 2016-01-02 DIAGNOSIS — I638 Other cerebral infarction: Secondary | ICD-10-CM | POA: Diagnosis not present

## 2016-01-02 NOTE — Telephone Encounter (Signed)
[facility] requesting call back.  Gave device clinic phone number.  Will request that patient schedule an appointment in the device clinic for [facility] reprogramming for T-wave oversensing and to extend AF detection to 1 hour per Dr. Royann Shivers.

## 2016-01-02 NOTE — Progress Notes (Signed)
Carelink Summary Report / Loop Recorder 

## 2016-01-06 ENCOUNTER — Encounter: Payer: Self-pay | Admitting: *Deleted

## 2016-01-06 NOTE — Telephone Encounter (Signed)
Called patient to schedule device clinic appointment.  Patient is agreeable to appointment on 01/26/16 at 11:30am.  He is aware that it is at the [facility] Office.  He requests that I send him a letter with the appointment date and time.  Letter sent today.  Patient is appreciative of call and denies questions or concerns at this time.

## 2016-01-21 LAB — CUP PACEART REMOTE DEVICE CHECK: Date Time Interrogation Session: 20170126183656

## 2016-01-21 NOTE — Progress Notes (Signed)
Carelink summary report received. Battery status OK. Normal device function. No new symptom episodes, brady, or pause episodes.5 tachy-ECGs appear oversensing. 7 AF- 0% burden +Plavix, ECGs appear ?SR w/ PACs/PAT. Forwarded to [facility] for review. Monthly summary reports and ROV/PRN

## 2016-01-27 ENCOUNTER — Encounter: Payer: Self-pay | Admitting: Cardiovascular Disease

## 2016-01-27 NOTE — Progress Notes (Signed)
Patient no-showed to appointment for [facility] reprogramming on 01/26/16.  Since unable to reprogram device to AF detection/episode storage of >/=1 hour, do not print "AF" episodes of <1 hour duration per Dr. Royann Shivers.

## 2016-01-30 ENCOUNTER — Ambulatory Visit (INDEPENDENT_AMBULATORY_CARE_PROVIDER_SITE_OTHER): Payer: Medicare Other | Admitting: *Deleted

## 2016-01-30 DIAGNOSIS — I638 Other cerebral infarction: Secondary | ICD-10-CM

## 2016-01-30 DIAGNOSIS — I6389 Other cerebral infarction: Secondary | ICD-10-CM

## 2016-01-31 NOTE — Progress Notes (Signed)
Carelink Summary Report / Loop Recorder 

## 2016-02-01 ENCOUNTER — Encounter: Payer: Self-pay | Admitting: Cardiovascular Disease

## 2016-02-29 ENCOUNTER — Ambulatory Visit (INDEPENDENT_AMBULATORY_CARE_PROVIDER_SITE_OTHER): Payer: Medicare Other | Admitting: *Deleted

## 2016-02-29 DIAGNOSIS — I6389 Other cerebral infarction: Secondary | ICD-10-CM

## 2016-02-29 DIAGNOSIS — I638 Other cerebral infarction: Secondary | ICD-10-CM

## 2016-03-01 NOTE — Progress Notes (Signed)
Carelink Summary Report / Loop Recorder 

## 2016-03-18 NOTE — Progress Notes (Signed)
[facility] summary report received. Battery status OK. Normal device function. No new symptom episodes, brady, or pause episodes. 2 tachy- previously reviewed. 16 AF 0.1% episodes some EGMs slightlly irregular with artifact.  Monthly summary reports and ROV/PRN

## 2016-03-28 ENCOUNTER — Encounter: Payer: Self-pay | Admitting: Cardiovascular Disease

## 2016-03-30 ENCOUNTER — Ambulatory Visit (INDEPENDENT_AMBULATORY_CARE_PROVIDER_SITE_OTHER): Payer: Medicare Other | Admitting: *Deleted

## 2016-03-30 DIAGNOSIS — I638 Other cerebral infarction: Secondary | ICD-10-CM

## 2016-03-30 DIAGNOSIS — I6389 Other cerebral infarction: Secondary | ICD-10-CM

## 2016-03-31 LAB — CUP PACEART REMOTE DEVICE CHECK: MDC IDC SESS DTM: 20170327194244

## 2016-03-31 NOTE — Progress Notes (Signed)
[facility] summary report received. Battery status OK. Normal device function. No new symptom episodes, brady, or pause episodes. Chronic tachy and AF episodes that are oversensing/runs AT. Monthly summary reports and ROV/PRN

## 2016-04-02 LAB — CUP PACEART REMOTE DEVICE CHECK: Date Time Interrogation Session: 20170426200922

## 2016-04-03 NOTE — Progress Notes (Signed)
Carelink Summary Report / Loop Recorder 

## 2016-04-30 ENCOUNTER — Ambulatory Visit (INDEPENDENT_AMBULATORY_CARE_PROVIDER_SITE_OTHER): Payer: Medicare Other | Admitting: *Deleted

## 2016-04-30 DIAGNOSIS — I638 Other cerebral infarction: Secondary | ICD-10-CM

## 2016-04-30 DIAGNOSIS — I6389 Other cerebral infarction: Secondary | ICD-10-CM

## 2016-04-30 NOTE — Progress Notes (Signed)
Carelink Summary Report / Loop Recorder 

## 2016-05-08 LAB — CUP PACEART REMOTE DEVICE CHECK: MDC IDC SESS DTM: 20170526203901

## 2016-05-08 NOTE — Progress Notes (Signed)
[facility] summary report received. Battery status OK. Normal device function. No new symptom episodes, brady, or pause episodes. 9 tachy- 1 new, appears artifact and TWOS. 12 AF, 1 with EGM previously addressed. +Plavix. Monthly summary reports and ROV/PRN

## 2016-05-18 LAB — CUP PACEART REMOTE DEVICE CHECK: MDC IDC SESS DTM: 20170625210800

## 2016-05-29 ENCOUNTER — Ambulatory Visit (INDEPENDENT_AMBULATORY_CARE_PROVIDER_SITE_OTHER): Payer: Medicare Other | Admitting: *Deleted

## 2016-05-29 DIAGNOSIS — I6389 Other cerebral infarction: Secondary | ICD-10-CM

## 2016-05-29 DIAGNOSIS — I638 Other cerebral infarction: Secondary | ICD-10-CM | POA: Diagnosis not present

## 2016-05-30 NOTE — Progress Notes (Signed)
Carelink Summary Report / Loop Recorder 

## 2016-06-23 LAB — CUP PACEART REMOTE DEVICE CHECK: MDC IDC SESS DTM: 20170725211218

## 2016-06-23 NOTE — Progress Notes (Signed)
[facility] summary report received. Battery status OK. Normal device function. No new symptom episodes, brady, or pause episodes. 3 tachy- all previously reviewed. 20 AF 0.1% ECGs previously reviewed, appear noise/AT/PACs +Plavix.  Monthly summary reports and ROV/PRN

## 2016-06-28 ENCOUNTER — Ambulatory Visit (INDEPENDENT_AMBULATORY_CARE_PROVIDER_SITE_OTHER): Payer: Medicare Other | Admitting: *Deleted

## 2016-06-28 DIAGNOSIS — I6389 Other cerebral infarction: Secondary | ICD-10-CM

## 2016-06-28 DIAGNOSIS — I638 Other cerebral infarction: Secondary | ICD-10-CM

## 2016-06-29 NOTE — Progress Notes (Signed)
Carelink Summary Report / Loop Recorder 

## 2016-07-11 ENCOUNTER — Telehealth: Payer: Self-pay | Admitting: *Deleted

## 2016-07-11 NOTE — Telephone Encounter (Signed)
Called patient to discuss [employer] software update.  Patient is also due for an appointment with Dr. Royann Shivers this month.  Will plan to wand patient for software update at this appointment.  Patient requests that I call his mother to schedule an appointment.  Called patient's mother and she states that she will talk to the patient.  She requests that I call him back in a few minutes.

## 2016-07-12 NOTE — Telephone Encounter (Signed)
Spoke with patient and scheduled appointment with Dr. Royann Shivers for 08/07/16.  Will plan to wand patient's ILR for software update at this appointment.  Patient requests that I call his mother to make her aware.  Attempted to reach patient's mother--no answer.  Will try again later.

## 2016-07-17 ENCOUNTER — Ambulatory Visit: Payer: Medicare Other | Admitting: *Deleted

## 2016-07-18 NOTE — Telephone Encounter (Signed)
Spoke with patient's mother regarding scheduled appointment on 08/07/16 with Dr. Royann Shivers.  Ms. Laural Benes states there is no way that she can bring the patient in for an appointment that day as she will have to take both her son and her daughter to pick up their checks that day.  Advised that I will have a scheduler contact the patient to reschedule and Ms. Laural Benes is agreeable.

## 2016-07-19 ENCOUNTER — Other Ambulatory Visit: Payer: Self-pay | Admitting: Cardiovascular Disease

## 2016-07-28 LAB — CUP PACEART REMOTE DEVICE CHECK: MDC IDC SESS DTM: 20170824210750

## 2016-07-28 NOTE — Progress Notes (Signed)
[facility] summary report received. Battery status OK. Normal device function. No new symptom episodes, tachy episodes, brady, or pause episodes. 0.1% AF episodes, SR with PACs. Monthly summary reports and ROV/PRN

## 2016-07-30 ENCOUNTER — Ambulatory Visit (INDEPENDENT_AMBULATORY_CARE_PROVIDER_SITE_OTHER): Payer: Medicare Other | Admitting: *Deleted

## 2016-07-30 DIAGNOSIS — I638 Other cerebral infarction: Secondary | ICD-10-CM

## 2016-07-30 DIAGNOSIS — I6389 Other cerebral infarction: Secondary | ICD-10-CM

## 2016-07-30 LAB — CUP PACEART REMOTE DEVICE CHECK: MDC IDC SESS DTM: 20170923210622

## 2016-07-30 NOTE — Progress Notes (Signed)
Carelink Summary Report / Loop Recorder 

## 2016-08-07 ENCOUNTER — Encounter: Payer: Medicare Other | Admitting: Cardiovascular Disease

## 2016-08-09 ENCOUNTER — Telehealth: Payer: Self-pay | Admitting: Cardiology

## 2016-08-09 NOTE — Telephone Encounter (Signed)
Spoke w/ pt and requested that he send a manual transmission b/c his home monitor has not updated in at least 14 days.   

## 2016-08-16 ENCOUNTER — Encounter: Payer: Self-pay | Admitting: Cardiology

## 2016-08-27 ENCOUNTER — Encounter: Payer: Medicare Other | Admitting: *Deleted

## 2016-08-28 ENCOUNTER — Encounter: Payer: Self-pay | Admitting: Cardiovascular Disease

## 2016-08-28 ENCOUNTER — Ambulatory Visit (INDEPENDENT_AMBULATORY_CARE_PROVIDER_SITE_OTHER): Payer: Medicare Other | Admitting: Cardiovascular Disease

## 2016-08-28 VITALS — BP 119/94 | HR 56 | Ht 68.0 in | Wt 123.4 lb

## 2016-08-28 DIAGNOSIS — I4719 Other supraventricular tachycardia: Secondary | ICD-10-CM

## 2016-08-28 DIAGNOSIS — I441 Atrioventricular block, second degree: Secondary | ICD-10-CM

## 2016-08-28 DIAGNOSIS — I471 Supraventricular tachycardia: Secondary | ICD-10-CM | POA: Diagnosis not present

## 2016-08-28 DIAGNOSIS — I6629 Occlusion and stenosis of unspecified posterior cerebral artery: Secondary | ICD-10-CM

## 2016-08-28 DIAGNOSIS — E785 Hyperlipidemia, unspecified: Secondary | ICD-10-CM | POA: Diagnosis not present

## 2016-08-28 DIAGNOSIS — I739 Peripheral vascular disease, unspecified: Secondary | ICD-10-CM | POA: Diagnosis not present

## 2016-08-28 DIAGNOSIS — I10 Essential (primary) hypertension: Secondary | ICD-10-CM

## 2016-08-28 DIAGNOSIS — Z4509 Encounter for adjustment and management of other cardiac device: Secondary | ICD-10-CM

## 2016-08-28 MED ORDER — ATORVASTATIN CALCIUM 40 MG PO TABS: 40.0000 mg | ORAL_TABLET | Freq: Every day | ORAL | 11 refills | Status: DC

## 2016-08-28 NOTE — Progress Notes (Signed)
Cardiology Office Note    Date:  08/29/2016   ID:  Joe Durham, DOB 1946/05/06, MRN [id number]  PCP:  Howard Pouch, MD  Cardiologist:   Thurmon Fair, MD   Chief Complaint  Patient presents with  . Follow-up    History of Present Illness:  Joe Durham is a 70 y.o. 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 He has an implantable loop recorder in place. This has shown frequent episodes of paroxysmal atrial tachycardia as well as frequent episodes of irregular rhythm secondary to Mobitz type I second-degree AV block. He has a persistent left lower quadrant visual field defect but has otherwise completely recovered from his stroke and feels well.  He denies chest pain or shortness of breath with activity and has not had leg edema. He seems to describe fairly typical intermittent claudication of the left calf, walking across a large parking lot. It is quite reproducible.  His electrocardiogram as well as numerous loop recorder interrogation show recurrent episodes of Wenckebach AV block, today with a 3:2 cycle. He was never severe bradycardia. Today's heart rate is 56 bpm. When he conducts 1:1 the PR interval is often very long, and excessive 300 ms.  Additional medical problems include treated hypertension and hyperlipidemia. Not sure why but his statin has been discontinued.    Past Medical History:  Diagnosis Date  . Bladder calculi   . COPD (chronic obstructive pulmonary disease) (HCC)   . Diplopia    PT STATES WHEN LYING DOWN HE SEES DOUPLE ,  WHEN HE SITS UP IT CLEARS UP  . Fracture of nasal bone    03-20-2014   MILD COMMUNIUTED  . Hypertension   . Short of breath on exertion   . Wears glasses   . Wrist fracture, left    S/P  ORIF  03-20-2014--  has hard cast on    Past Surgical History:  Procedure Laterality Date  . INGUINAL HERNIA REPAIR Right 2011  . LOOP RECORDER IMPLANT N/A 01/05/2015   Procedure: LOOP RECORDER IMPLANT;  Surgeon: Thurmon Fair, MD;  Location: [facility] CATH [facility];  Service: Cardiovascular;  Laterality: N/A;  . ORIF WRIST FRACTURE Left 03/20/2014   Procedure: OPEN REDUCTION INTERNAL FIXATION (ORIF) WRIST FRACTURE;  Surgeon: Dominica Severin, MD;  Location: [facility] OR;  Service: Orthopedics;  Laterality: Left;  . TEE WITHOUT CARDIOVERSION N/A 01/05/2015   Procedure: TRANSESOPHAGEAL ECHOCARDIOGRAM (TEE)/LOOP;  Surgeon: Thurmon Fair, MD;  Location: [facility] ENDOSCOPY;  Service: Cardiovascular;  Laterality: N/A;    Current Medications: Outpatient Medications Prior to Visit  Medication Sig Dispense Refill  . amLODipine (NORVASC) 10 MG tablet Take 1 tablet (10 mg total) by mouth daily. Please keep 08/28/16 appt for further refills 45 tablet 0  . atorvastatin (LIPITOR) 40 MG tablet Take 1 tablet (40 mg total) by mouth daily at 6 PM. 30 tablet 4  . clopidogrel (PLAVIX) 75 MG tablet Take 1 tablet (75 mg total) by mouth daily. 30 tablet 2   No facility-administered medications prior to visit.      Allergies:   Review of patient's allergies indicates no known allergies.   Social History   Social History  . Marital status: Single    Spouse name: N/A  . Number of children: 4  . Years of education: 9   Occupational History  . retired     Holiday representative   Social History Main Topics  . Smoking status: Current Every Day Smoker  Packs/day: 0.25    Years: 52.00    Types: Cigarettes  . Smokeless tobacco: Never Used     Comment: pt is down to 3 cig per day from 1 ppd  . Alcohol use 0.0 oz/week     Comment: " a fifth and a half a month", 04/05/15 1 pint every week  . Drug use: No  . Sexual activity: Not Asked   Other Topics Concern  . None   Social History Narrative   Single   Right handed   Caffeine use - sodas on weekends with alcohol     Family History:  The patient's family history includes Stroke in his mother and sister.   ROS:   Please see the history of present  illness.    ROS All other systems reviewed and are negative.   PHYSICAL EXAM:   VS:  BP (!) 119/94 (BP Location: Left Arm, Patient Position: Sitting, Cuff Size: Normal)   Pulse (!) 56   Ht 5\' 8"  (1.727 m)   Wt 123 lb 6.4 oz (56 kg)   SpO2 99%   BMI 18.76 kg/m    GEN: Very slender/undernourished, well developed, in no acute distress  HEENT: normal  Neck: no JVD, carotid bruits, or masses Cardiac: irregular; no murmurs, rubs, or gallops,no edema , healthy loop recorder site Respiratory:  clear to auscultation bilaterally, normal work of breathing GI: soft, nontender, nondistended, + BS MS: no deformity or atrophy  Skin: warm and dry, no rash Neuro:  Alert and Oriented x 3, Strength and sensation are intact Psych: euthymic mood, full affect  Wt Readings from Last 3 Encounters:  08/28/16 123 lb 6.4 oz (56 kg)  07/19/15 123 lb 4.8 oz (55.9 kg)  04/05/15 127 lb (57.6 kg)      Studies/Labs Reviewed:   EKG:  EKG is ordered today.  The ekg ordered today demonstrates Sinus rhythm with mild bradycardia due to second-degree AV block Mobitz type I. Normal QTC 366 ms  Lipid Panel    Component Value Date/Time   CHOL 271 (H) 01/04/2015 0855   TRIG 79 01/04/2015 0855   HDL 70 01/04/2015 0855   CHOLHDL 3.9 01/04/2015 0855   VLDL 16 01/04/2015 0855   LDLCALC 185 (H) 01/04/2015 0855     ASSESSMENT:    1. Paroxysmal atrial tachycardia (HCC)   2. Second degree AV block, Mobitz type I   3. Essential hypertension   4. Dyslipidemia   5. Intermittent claudication (HCC)   6. Encounter for loop recorder check   7. 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 PAT: Asymptomatic, does not require therapy. Avoid beta blockers and centrally acting calcium channel blockers due to AV block. 2. Second-degree AV block, MT 1: Is appears to be asymptomatic and has not been associated with severe bradycardia. Avoid negative  chronotropic agents. So far no indication for pacemaker. Monitor via loop recorder. 3. HTN: Controlled on amlodipine monotherapy 4. HLP: Despite being very slender he has severely elevated LDL cholesterol. In view of previous history of stroke and suspected diagnosis of PAD he should be on a statin. I'm not sure why this was discontinued and plan to restart it today. 5. Intermittent claudication: Symptoms and physical exam compatible with high-grade stenosis of the left femoral artery. Scheduled for duplex ultrasonography. Consider referral for angiography if there is confirmation of the diagnosis. 6. ILR: Loop recorder softer upgraded today due  to artifactual low battery signal. 7. ASCVD: Sequelae of previous stroke/visual field defect probably due to severe stenosis of proximal right posterior cerebral artery.     Medication Adjustments/Labs and Tests Ordered: Current medicines are reviewed at length with the patient today.  Concerns regarding medicines are outlined above.  Medication changes, Labs and Tests ordered today are listed in the Patient Instructions below. Patient Instructions  Medication Instructions: Dr Royann Shivers has recommended making the following medication changes: 1. RESTART Atorvastatin 40 mg daily  Labwork: Your physician recommends that you return for lab work in 3 months - FASTING.  Testing/Procedures: 1. Lower Extremity Arterial Dopplers - Your physician has requested that you have a lower or upper extremity arterial duplex. This test is an ultrasound of the arteries in the legs or arms. It looks at arterial blood flow in the legs and arms. Allow one hour for Lower and Upper Arterial scans. There are no restrictions or special instructions.  Follow-up: Dr Royann Shivers recommends that you schedule a follow-up appointment in 1 year. You will receive a reminder letter in the mail two months in advance. If you don't receive a letter, please call our office to schedule the  follow-up appointment.  If you need a refill on your cardiac medications before your next appointment, please call your pharmacy.    Signed, Thurmon Fair, MD  08/29/2016 2:12 PM    [facility] [address] [place], [place], [place]  [address] Phone: [phone]; Fax: [phone]

## 2016-08-28 NOTE — Patient Instructions (Signed)
Medication Instructions: Dr Royann Shivers has recommended making the following medication changes: 1. RESTART Atorvastatin 40 mg daily  Labwork: Your physician recommends that you return for lab work in 3 months - FASTING.  Testing/Procedures: 1. Lower Extremity Arterial Dopplers - Your physician has requested that you have a lower or upper extremity arterial duplex. This test is an ultrasound of the arteries in the legs or arms. It looks at arterial blood flow in the legs and arms. Allow one hour for Lower and Upper Arterial scans. There are no restrictions or special instructions.  Follow-up: Dr Royann Shivers recommends that you schedule a follow-up appointment in 1 year. You will receive a reminder letter in the mail two months in advance. If you don't receive a letter, please call our office to schedule the follow-up appointment.  If you need a refill on your cardiac medications before your next appointment, please call your pharmacy.

## 2016-08-29 ENCOUNTER — Other Ambulatory Visit: Payer: Self-pay | Admitting: Cardiovascular Disease

## 2016-08-29 ENCOUNTER — Telehealth: Payer: Self-pay | Admitting: *Deleted

## 2016-08-29 DIAGNOSIS — I739 Peripheral vascular disease, unspecified: Secondary | ICD-10-CM

## 2016-08-29 NOTE — Telephone Encounter (Signed)
Spoke with patient regarding ILR software update obtained yesterday at appointment with Dr. Royann Shivers.  Patient's home monitor has not updated since September due to false RRT.  Assisted patient with sending manual transmission.  Software update successful.  Patient denies additional questions or concerns at this time.

## 2016-09-01 NOTE — Progress Notes (Signed)
[facility] summary report received. Battery status false RRT (software upgrade complete). Normal device function. No new symptom episodes, brady, or pause episodes. 4 tachy- previously reviewed. 14 AF episodes 0.1% previously reviewed, some ECGs with artifact, others SR w/ PACs, some UTD. Monthly summary reports and ROV/PRN

## 2016-09-25 ENCOUNTER — Ambulatory Visit (HOSPITAL_COMMUNITY)
Admission: RE | Admit: 2016-09-25 | Discharge: 2016-09-25 | Disposition: A | Discharge status: Home / Self Care | Payer: Medicare Other | Source: Ambulatory Visit | Attending: Cardiovascular Disease | Admitting: Cardiovascular Disease

## 2016-09-25 DIAGNOSIS — Z8673 Personal history of transient ischemic attack (TIA), and cerebral infarction without residual deficits: Secondary | ICD-10-CM | POA: Diagnosis not present

## 2016-09-25 DIAGNOSIS — J449 Chronic obstructive pulmonary disease, unspecified: Secondary | ICD-10-CM | POA: Diagnosis not present

## 2016-09-25 DIAGNOSIS — I739 Peripheral vascular disease, unspecified: Secondary | ICD-10-CM | POA: Insufficient documentation

## 2016-09-25 DIAGNOSIS — E785 Hyperlipidemia, unspecified: Secondary | ICD-10-CM | POA: Insufficient documentation

## 2016-09-25 DIAGNOSIS — Z87891 Personal history of nicotine dependence: Secondary | ICD-10-CM | POA: Insufficient documentation

## 2016-09-25 DIAGNOSIS — I10 Essential (primary) hypertension: Secondary | ICD-10-CM | POA: Insufficient documentation

## 2016-09-26 ENCOUNTER — Ambulatory Visit (INDEPENDENT_AMBULATORY_CARE_PROVIDER_SITE_OTHER): Payer: Medicare Other | Admitting: *Deleted

## 2016-09-26 DIAGNOSIS — I6389 Other cerebral infarction: Secondary | ICD-10-CM

## 2016-09-26 DIAGNOSIS — I638 Other cerebral infarction: Secondary | ICD-10-CM | POA: Diagnosis not present

## 2016-10-01 NOTE — Progress Notes (Signed)
Carelink Summary Report / Loop Recorder 

## 2016-10-26 ENCOUNTER — Ambulatory Visit (INDEPENDENT_AMBULATORY_CARE_PROVIDER_SITE_OTHER): Payer: Medicare Other | Admitting: *Deleted

## 2016-10-26 DIAGNOSIS — I6389 Other cerebral infarction: Secondary | ICD-10-CM

## 2016-10-26 DIAGNOSIS — I638 Other cerebral infarction: Secondary | ICD-10-CM

## 2016-10-30 NOTE — Progress Notes (Signed)
Carelink Summary Report / Loop Recorder 

## 2016-11-02 ENCOUNTER — Other Ambulatory Visit: Payer: Self-pay | Admitting: Cardiovascular Disease

## 2016-11-02 ENCOUNTER — Other Ambulatory Visit: Payer: Self-pay | Admitting: *Deleted

## 2016-11-02 MED ORDER — AMLODIPINE BESYLATE 10 MG PO TABS: 10.0000 mg | ORAL_TABLET | Freq: Every day | ORAL | 11 refills | Status: DC

## 2016-11-09 ENCOUNTER — Encounter: Payer: Medicare Other | Admitting: Cardiovascular Disease

## 2016-11-09 LAB — CUP PACEART REMOTE DEVICE CHECK
Date Time Interrogation Session: 20171122223726
MDC IDC PG IMPLANT DT: 20160302

## 2016-11-14 ENCOUNTER — Encounter: Payer: Self-pay | Admitting: Cardiovascular Disease

## 2016-11-14 ENCOUNTER — Ambulatory Visit (INDEPENDENT_AMBULATORY_CARE_PROVIDER_SITE_OTHER): Payer: Medicare Other | Admitting: Cardiovascular Disease

## 2016-11-14 DIAGNOSIS — I739 Peripheral vascular disease, unspecified: Secondary | ICD-10-CM

## 2016-11-14 MED ORDER — CILOSTAZOL 50 MG PO TABS: 50.0000 mg | ORAL_TABLET | Freq: Two times a day (BID) | ORAL | 3 refills | Status: DC

## 2016-11-14 NOTE — Progress Notes (Signed)
11/14/2016 Donevin Durham   1946-07-08  [id number]  Primary Physician Howard Pouch, MD Primary Cardiologist: Runell Gess MD Roseanne Reno  HPI:  [name]  is a 71 year old thin appearing single African-American male father of 4, grandfather and 6 grandchildren referred to me by Dr. Royann Shivers for peripheral vascular evaluation. He has a history of treated hypertension, hyperlipidemia and tobacco abuse. He has smoked 50 pack years. He drinks 1/2 pint of alcohol a day. 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 He's never had a heart attack but has had a stroke in the past. He is complaining of left calf claudication. His recent Dopplers performed 09/25/16 revealed ABIs in the 0.6 range bilaterally with an occluded right popliteal and tibial vessel occlusion on the left.   Current Outpatient Prescriptions  Medication Sig Dispense Refill  . amLODipine (NORVASC) 10 MG tablet Take 1 tablet (10 mg total) by mouth daily. Please keep 08/28/16 appt for further refills 30 tablet 11  . atorvastatin (LIPITOR) 40 MG tablet Take 1 tablet (40 mg total) by mouth daily at 6 PM. 30 tablet 11  . cilostazol (PLETAL) 50 MG tablet Take 1 tablet (50 mg total) by mouth 2 (two) times daily. 60 tablet 3   No current facility-administered medications for this visit.     No Known Allergies  Social History   Social History  . Marital status: Single    Spouse name: N/A  . Number of children: 4  . Years of education: 9   Occupational History  . retired     Holiday representative   Social History Main Topics  . Smoking status: Current Every Day Smoker    Packs/day: 0.25    Years: 52.00    Types: Cigarettes  . Smokeless tobacco: Never Used     Comment: pt is down to 3 cig per day from 1 ppd  . Alcohol use 0.0 oz/week     Comment: " a fifth and a half a month", 04/05/15 1 pint every week  . Drug use: No  . Sexual activity: Not on file   Other Topics Concern  . Not on file    Social History Narrative   Single   Right handed   Caffeine use - sodas on weekends with alcohol     Review of Systems: General: negative for chills, fever, night sweats or weight changes.  Cardiovascular: negative for chest pain, dyspnea on exertion, edema, orthopnea, palpitations, paroxysmal nocturnal dyspnea or shortness of breath Dermatological: negative for rash Respiratory: negative for cough or wheezing Urologic: negative for hematuria Abdominal: negative for nausea, vomiting, diarrhea, bright red blood per rectum, melena, or hematemesis Neurologic: negative for visual changes, syncope, or dizziness All other systems reviewed and are otherwise negative except as noted above.    Blood pressure (!) 148/64, pulse (!) 55, height 5\' 7"  (1.702 m), weight 125 lb (56.7 kg).  General appearance: alert and no distress Neck: no adenopathy, no carotid bruit, no JVD, supple, symmetrical, trachea midline and thyroid not enlarged, symmetric, no tenderness/mass/nodules Lungs: clear to auscultation bilaterally Heart: regular rate and rhythm, S1, S2 normal, no murmur, click, rub or gallop Extremities: extremities normal, atraumatic, no cyanosis or edema  EKG not performed today  ASSESSMENT AND PLAN:   Claudication [facility]) Mr. Leichter was referred to me by Dr. Royann Shivers for evaluation of left calf claudication. He's had this for several years. His risk factors include treated hypertension, hyperlipidemia and tobacco abuse. He had Dopplers  performed 09/25/16 which revealed ABIs of 0.6 bilaterally with occluded right popliteal and posterior tibial and occluded left posterior tibial and peroneal artery. He has no large vessel disease about this. I am going to initially treat him medically with Pletal 50 mg by mouth twice a day. I'll see him back in 3 months for follow-up.      Runell Gess MD FACP,FACC,FAHA, [facility] 11/14/2016 12:07 PM

## 2016-11-14 NOTE — Assessment & Plan Note (Signed)
Joe Durham was referred to me by Dr. Royann Shivers for evaluation of left calf claudication. He's had this for several years. His risk factors include treated hypertension, hyperlipidemia and tobacco abuse. He had Dopplers performed 09/25/16 which revealed ABIs of 0.6 bilaterally with occluded right popliteal and posterior tibial and occluded left posterior tibial and peroneal artery. He has no large vessel disease about this. I am going to initially treat him medically with Pletal 50 mg by mouth twice a day. I'll see him back in 3 months for follow-up.

## 2016-11-14 NOTE — Patient Instructions (Signed)
Medication Instructions: START Pletal 50 mg twice daily.  Follow-Up: Your physician recommends that you schedule a follow-up appointment in: 3 months with Dr. Berry.  If you need a refill on your cardiac medications before your next appointment, please call your pharmacy.  

## 2016-11-26 ENCOUNTER — Ambulatory Visit (INDEPENDENT_AMBULATORY_CARE_PROVIDER_SITE_OTHER): Payer: Medicare Other | Admitting: *Deleted

## 2016-11-26 DIAGNOSIS — I6389 Other cerebral infarction: Secondary | ICD-10-CM

## 2016-11-26 DIAGNOSIS — I638 Other cerebral infarction: Secondary | ICD-10-CM

## 2016-11-28 NOTE — Progress Notes (Signed)
Carelink Summary Report / Loop Recorder 

## 2016-12-05 ENCOUNTER — Other Ambulatory Visit: Payer: Self-pay | Admitting: Cardiovascular Disease

## 2016-12-13 LAB — CUP PACEART REMOTE DEVICE CHECK
Implantable Pulse Generator Implant Date: 20160302
MDC IDC SESS DTM: 20171222230917

## 2016-12-25 ENCOUNTER — Ambulatory Visit (INDEPENDENT_AMBULATORY_CARE_PROVIDER_SITE_OTHER): Payer: Medicare Other | Admitting: *Deleted

## 2016-12-25 DIAGNOSIS — I638 Other cerebral infarction: Secondary | ICD-10-CM

## 2016-12-25 DIAGNOSIS — I6389 Other cerebral infarction: Secondary | ICD-10-CM

## 2016-12-26 NOTE — Progress Notes (Signed)
Carelink Summary Report / Loop Recorder 

## 2016-12-29 LAB — CUP PACEART REMOTE DEVICE CHECK
Date Time Interrogation Session: 20180121233924
MDC IDC PG IMPLANT DT: 20160302

## 2017-01-15 LAB — CUP PACEART REMOTE DEVICE CHECK
Date Time Interrogation Session: 20180220233846
MDC IDC PG IMPLANT DT: 20160302

## 2017-01-24 ENCOUNTER — Ambulatory Visit (INDEPENDENT_AMBULATORY_CARE_PROVIDER_SITE_OTHER): Payer: Medicare Other | Admitting: *Deleted

## 2017-01-24 DIAGNOSIS — I638 Other cerebral infarction: Secondary | ICD-10-CM

## 2017-01-24 DIAGNOSIS — I6389 Other cerebral infarction: Secondary | ICD-10-CM

## 2017-01-25 NOTE — Progress Notes (Signed)
Carelink Summary Report / Loop Recorder 

## 2017-02-06 LAB — CUP PACEART REMOTE DEVICE CHECK
Date Time Interrogation Session: 20180323000827
MDC IDC PG IMPLANT DT: 20160302

## 2017-02-25 ENCOUNTER — Ambulatory Visit (INDEPENDENT_AMBULATORY_CARE_PROVIDER_SITE_OTHER): Payer: Medicare Other | Admitting: *Deleted

## 2017-02-25 DIAGNOSIS — I6389 Other cerebral infarction: Secondary | ICD-10-CM

## 2017-02-25 DIAGNOSIS — I638 Other cerebral infarction: Secondary | ICD-10-CM

## 2017-02-26 NOTE — Progress Notes (Signed)
Carelink Summary Report / Loop Recorder 

## 2017-03-14 LAB — CUP PACEART REMOTE DEVICE CHECK
Date Time Interrogation Session: 20180422000948
MDC IDC PG IMPLANT DT: 20160302

## 2017-03-25 ENCOUNTER — Ambulatory Visit (INDEPENDENT_AMBULATORY_CARE_PROVIDER_SITE_OTHER): Payer: Medicare Other | Admitting: *Deleted

## 2017-03-25 DIAGNOSIS — I6389 Other cerebral infarction: Secondary | ICD-10-CM

## 2017-03-25 DIAGNOSIS — I638 Other cerebral infarction: Secondary | ICD-10-CM

## 2017-03-26 ENCOUNTER — Telehealth: Payer: Self-pay | Admitting: *Deleted

## 2017-03-26 NOTE — Telephone Encounter (Signed)
LMOVM regarding sending a manual transmission to review Full Report. 2 AF episodes, 1 available ECG. Gave device clinic number to call back if further questions or concerns.

## 2017-03-26 NOTE — Progress Notes (Signed)
Carelink Summary Report / Loop Recorder 

## 2017-03-28 LAB — CUP PACEART REMOTE DEVICE CHECK
Implantable Pulse Generator Implant Date: 20160302
MDC IDC SESS DTM: 20180522003701

## 2017-04-05 NOTE — Telephone Encounter (Signed)
Spoke with patient.  He is not home right now to send a transmission but he will call back later today for assistance.  He is appreciative and denies additional questions at this time.

## 2017-04-19 NOTE — Telephone Encounter (Signed)
Spoke with patient regarding not receiving manual transmission. He states he forgot to call us back the other day and is not home right now, but he will call back later today for assistance. Advised patient I will call him back next week if I do not hear from him this afternoon. He verbalizes understanding and is appreciative.

## 2017-04-24 ENCOUNTER — Ambulatory Visit (INDEPENDENT_AMBULATORY_CARE_PROVIDER_SITE_OTHER): Payer: Medicare Other | Admitting: *Deleted

## 2017-04-24 DIAGNOSIS — I6389 Other cerebral infarction: Secondary | ICD-10-CM

## 2017-04-24 DIAGNOSIS — I638 Other cerebral infarction: Secondary | ICD-10-CM | POA: Diagnosis not present

## 2017-04-24 NOTE — Telephone Encounter (Signed)
New Message ° ° pt verbalized that he is returning call for rn  °

## 2017-04-24 NOTE — Telephone Encounter (Signed)
Returned call to patient. Patient states he called [facility] [employer] and he was unable to follow the prompts. Attempted to send manual transmission again, unsuccessful, patient states the monitor is showing an arrow to pick up the monitor and put it right back down. Patient states he does not want to call [employer] back.   Advised patient to schedule an appointment with Device [facility] to help troubleshoot monitor. He suggests transportation issues, his niece would be able to bring him sometime next week. Advised patient to discuss with his niece and to call us back to schedule an appointment with the device clinic next week. Patient verbalized understanding.

## 2017-04-24 NOTE — Telephone Encounter (Signed)
Spoke with patient regarding not receiving manual transmission. He states he forgot to call us back, but he is home right now. Attempted to send manual transmission, unsuccessful, patient states he sees a question mark at the top and a green arrow in the middle of the screen and stepping stones. Patient given [facility] [employer] number to help troubleshoot. Patient states he will call back once he sends transmission.

## 2017-04-25 ENCOUNTER — Telehealth: Payer: Self-pay | Admitting: *Deleted

## 2017-04-25 NOTE — Telephone Encounter (Signed)
Spoke with patient regarding receiving manual transmission. Reminded patient of follow-up 08/2017 with Dr. Royann Shivers. Patient verbalized understanding.

## 2017-04-25 NOTE — Progress Notes (Signed)
Carelink Summary Report / Loop Recorder 

## 2017-04-25 NOTE — Telephone Encounter (Signed)
Opened in error. See phone note 03/26/17.

## 2017-05-03 LAB — CUP PACEART REMOTE DEVICE CHECK
Date Time Interrogation Session: 20180621004034
MDC IDC PG IMPLANT DT: 20160302

## 2017-05-16 ENCOUNTER — Telehealth: Payer: Self-pay | Admitting: Cardiology

## 2017-05-16 NOTE — Telephone Encounter (Signed)
Spoke w/ pt and requested that he send a manual transmission b/c his home monitor has not updated in at least 14 days.   

## 2017-05-22 ENCOUNTER — Encounter: Payer: Self-pay | Admitting: Cardiology

## 2017-05-24 ENCOUNTER — Ambulatory Visit (INDEPENDENT_AMBULATORY_CARE_PROVIDER_SITE_OTHER): Payer: Medicare Other | Admitting: *Deleted

## 2017-05-24 DIAGNOSIS — I638 Other cerebral infarction: Secondary | ICD-10-CM | POA: Diagnosis not present

## 2017-05-24 DIAGNOSIS — I6389 Other cerebral infarction: Secondary | ICD-10-CM

## 2017-05-28 NOTE — Progress Notes (Signed)
Carelink Summary Report / Loop Recorder 

## 2017-06-08 LAB — CUP PACEART REMOTE DEVICE CHECK
MDC IDC PG IMPLANT DT: 20160302
MDC IDC SESS DTM: 20180721014036

## 2017-06-08 NOTE — Progress Notes (Signed)
[facility] summary report received. Battery status OK. Normal device function. No new symptom episodes, brady, or pause episodes. 25 AF 0.5%- 2 w/ ECGs, unable to determine d/t artifact, irreg. VS noted. 3 tachy- 2 w/ ECGs appear ventricular oversensing noted, also some vent. undersensing. Monthly summary reports and ROV/PRN

## 2017-06-11 ENCOUNTER — Encounter (HOSPITAL_COMMUNITY): Payer: Self-pay | Admitting: Emergency Medicine

## 2017-06-11 ENCOUNTER — Inpatient Hospital Stay (HOSPITAL_COMMUNITY)
Admission: EM | Admit: 2017-06-11 | Discharge: 2017-06-17 | DRG: 690 | Disposition: A | Discharge status: Home Health | Payer: Medicare Other | Attending: Internal Medicine | Admitting: Internal Medicine

## 2017-06-11 ENCOUNTER — Emergency Department (HOSPITAL_COMMUNITY): Payer: Medicare Other

## 2017-06-11 DIAGNOSIS — N10 Acute tubulo-interstitial nephritis: Secondary | ICD-10-CM | POA: Diagnosis not present

## 2017-06-11 DIAGNOSIS — J449 Chronic obstructive pulmonary disease, unspecified: Secondary | ICD-10-CM | POA: Diagnosis present

## 2017-06-11 DIAGNOSIS — Z79899 Other long term (current) drug therapy: Secondary | ICD-10-CM

## 2017-06-11 DIAGNOSIS — N390 Urinary tract infection, site not specified: Secondary | ICD-10-CM | POA: Diagnosis present

## 2017-06-11 DIAGNOSIS — N320 Bladder-neck obstruction: Secondary | ICD-10-CM | POA: Diagnosis present

## 2017-06-11 DIAGNOSIS — R634 Abnormal weight loss: Secondary | ICD-10-CM | POA: Diagnosis present

## 2017-06-11 DIAGNOSIS — Z791 Long term (current) use of non-steroidal anti-inflammatories (NSAID): Secondary | ICD-10-CM

## 2017-06-11 DIAGNOSIS — N289 Disorder of kidney and ureter, unspecified: Secondary | ICD-10-CM

## 2017-06-11 DIAGNOSIS — N210 Calculus in bladder: Secondary | ICD-10-CM | POA: Diagnosis present

## 2017-06-11 DIAGNOSIS — F17210 Nicotine dependence, cigarettes, uncomplicated: Secondary | ICD-10-CM | POA: Diagnosis present

## 2017-06-11 DIAGNOSIS — N179 Acute kidney failure, unspecified: Secondary | ICD-10-CM | POA: Diagnosis present

## 2017-06-11 DIAGNOSIS — N319 Neuromuscular dysfunction of bladder, unspecified: Secondary | ICD-10-CM | POA: Diagnosis present

## 2017-06-11 DIAGNOSIS — F1020 Alcohol dependence, uncomplicated: Secondary | ICD-10-CM | POA: Diagnosis present

## 2017-06-11 DIAGNOSIS — N3001 Acute cystitis with hematuria: Secondary | ICD-10-CM | POA: Diagnosis present

## 2017-06-11 DIAGNOSIS — Z2821 Immunization not carried out because of patient refusal: Secondary | ICD-10-CM

## 2017-06-11 DIAGNOSIS — I10 Essential (primary) hypertension: Secondary | ICD-10-CM | POA: Diagnosis present

## 2017-06-11 DIAGNOSIS — Z681 Body mass index (BMI) 19 or less, adult: Secondary | ICD-10-CM

## 2017-06-11 DIAGNOSIS — N200 Calculus of kidney: Secondary | ICD-10-CM | POA: Diagnosis present

## 2017-06-11 DIAGNOSIS — R310 Gross hematuria: Secondary | ICD-10-CM | POA: Diagnosis present

## 2017-06-11 LAB — COMPREHENSIVE METABOLIC PANEL
ALT: 10 U/L — ABNORMAL LOW (ref 17–63)
ANION GAP: 10 (ref 5–15)
AST: 15 U/L (ref 15–41)
Albumin: 4 g/dL (ref 3.5–5.0)
Alkaline Phosphatase: 57 U/L (ref 38–126)
BUN: 21 mg/dL — ABNORMAL HIGH (ref 6–20)
CHLORIDE: 103 mmol/L (ref 101–111)
CO2: 26 mmol/L (ref 22–32)
Calcium: 10.1 mg/dL (ref 8.9–10.3)
Creatinine, Ser: 1.39 mg/dL — ABNORMAL HIGH (ref 0.61–1.24)
GFR, EST AFRICAN AMERICAN: 58 mL/min — AB (ref >60)
GFR, EST NON AFRICAN AMERICAN: 50 mL/min — AB (ref >60)
Glucose, Bld: 95 mg/dL (ref 65–99)
POTASSIUM: 4.5 mmol/L (ref 3.5–5.1)
Sodium: 139 mmol/L (ref 135–145)
Total Bilirubin: 0.7 mg/dL (ref 0.3–1.2)
Total Protein: 8.5 g/dL — ABNORMAL HIGH (ref 6.5–8.1)

## 2017-06-11 LAB — URINALYSIS, ROUTINE W REFLEX MICROSCOPIC
BILIRUBIN URINE: NEGATIVE
Glucose, UA: NEGATIVE mg/dL
KETONES UR: NEGATIVE mg/dL
Nitrite: NEGATIVE
SQUAMOUS EPITHELIAL / LPF: NONE SEEN
Specific Gravity, Urine: 1.014 (ref 1.005–1.030)
pH: 5 (ref 5.0–8.0)

## 2017-06-11 LAB — CBC
HEMATOCRIT: 39.9 % (ref 39.0–52.0)
HEMOGLOBIN: 13.9 g/dL (ref 13.0–17.0)
MCH: 36.4 pg — ABNORMAL HIGH (ref 26.0–34.0)
MCHC: 34.8 g/dL (ref 30.0–36.0)
MCV: 104.5 fL — AB (ref 78.0–100.0)
Platelets: 337 10*3/uL (ref 150–400)
RBC: 3.82 MIL/uL — AB (ref 4.22–5.81)
RDW: 12.8 % (ref 11.5–15.5)
WBC: 13.8 10*3/uL — AB (ref 4.0–10.5)

## 2017-06-11 LAB — LIPASE, BLOOD: LIPASE: 28 U/L (ref 11–51)

## 2017-06-11 MED ORDER — IOPAMIDOL (ISOVUE-300) INJECTION 61%
100.0000 mL | Freq: Once | INTRAVENOUS | Status: AC | PRN
  Administered 2017-06-11: 80 mL via INTRAVENOUS

## 2017-06-11 NOTE — ED Triage Notes (Signed)
Pt c/o hematuria and penile pain worse with urination, urinary dribbling after finishing urination, low abdominal pain worse after urination x 3 months. Wt loss x 3 months, fevers, sweating, pain worse at night.

## 2017-06-12 ENCOUNTER — Encounter (HOSPITAL_COMMUNITY): Payer: Self-pay | Admitting: Family Medicine

## 2017-06-12 DIAGNOSIS — N289 Disorder of kidney and ureter, unspecified: Secondary | ICD-10-CM

## 2017-06-12 DIAGNOSIS — N210 Calculus in bladder: Secondary | ICD-10-CM

## 2017-06-12 DIAGNOSIS — F1020 Alcohol dependence, uncomplicated: Secondary | ICD-10-CM

## 2017-06-12 DIAGNOSIS — R319 Hematuria, unspecified: Secondary | ICD-10-CM | POA: Diagnosis not present

## 2017-06-12 DIAGNOSIS — N3001 Acute cystitis with hematuria: Secondary | ICD-10-CM

## 2017-06-12 DIAGNOSIS — N179 Acute kidney failure, unspecified: Secondary | ICD-10-CM

## 2017-06-12 DIAGNOSIS — I10 Essential (primary) hypertension: Secondary | ICD-10-CM

## 2017-06-12 DIAGNOSIS — N10 Acute tubulo-interstitial nephritis: Principal | ICD-10-CM

## 2017-06-12 DIAGNOSIS — N390 Urinary tract infection, site not specified: Secondary | ICD-10-CM | POA: Diagnosis present

## 2017-06-12 DIAGNOSIS — R310 Gross hematuria: Secondary | ICD-10-CM

## 2017-06-12 LAB — CBC WITH DIFFERENTIAL/PLATELET
Basophils Absolute: 0 10*3/uL (ref 0.0–0.1)
Basophils Relative: 0 %
EOS ABS: 0.2 10*3/uL (ref 0.0–0.7)
EOS PCT: 2 %
HCT: 37.4 % — ABNORMAL LOW (ref 39.0–52.0)
Hemoglobin: 12.9 g/dL — ABNORMAL LOW (ref 13.0–17.0)
LYMPHS ABS: 1.7 10*3/uL (ref 0.7–4.0)
LYMPHS PCT: 16 %
MCH: 35.6 pg — AB (ref 26.0–34.0)
MCHC: 34.5 g/dL (ref 30.0–36.0)
MCV: 103.3 fL — AB (ref 78.0–100.0)
MONO ABS: 1.4 10*3/uL — AB (ref 0.1–1.0)
MONOS PCT: 14 %
Neutro Abs: 7.1 10*3/uL (ref 1.7–7.7)
Neutrophils Relative %: 68 %
PLATELETS: 310 10*3/uL (ref 150–400)
RBC: 3.62 MIL/uL — AB (ref 4.22–5.81)
RDW: 12.6 % (ref 11.5–15.5)
WBC: 10.4 10*3/uL (ref 4.0–10.5)

## 2017-06-12 LAB — BASIC METABOLIC PANEL
ANION GAP: 8 (ref 5–15)
BUN: 21 mg/dL — AB (ref 6–20)
CALCIUM: 9.5 mg/dL (ref 8.9–10.3)
CO2: 27 mmol/L (ref 22–32)
CREATININE: 1.31 mg/dL — AB (ref 0.61–1.24)
Chloride: 106 mmol/L (ref 101–111)
GFR calc Af Amer: >60 mL/min (ref >60)
GFR, EST NON AFRICAN AMERICAN: 54 mL/min — AB (ref >60)
GLUCOSE: 112 mg/dL — AB (ref 65–99)
Potassium: 4 mmol/L (ref 3.5–5.1)
Sodium: 141 mmol/L (ref 135–145)

## 2017-06-12 MED ORDER — LORAZEPAM 2 MG/ML IJ SOLN: 1.0000 mg | Freq: Four times a day (QID) | INTRAMUSCULAR | Status: AC | PRN

## 2017-06-12 MED ORDER — ONDANSETRON HCL 4 MG PO TABS: 4.0000 mg | ORAL_TABLET | Freq: Four times a day (QID) | ORAL | Status: DC | PRN

## 2017-06-12 MED ORDER — ENSURE ENLIVE PO LIQD
237.0000 mL | Freq: Two times a day (BID) | ORAL | Status: DC
  Administered 2017-06-12 – 2017-06-17 (×4): 237 mL via ORAL

## 2017-06-12 MED ORDER — LORAZEPAM 1 MG PO TABS
0.0000 mg | ORAL_TABLET | Freq: Four times a day (QID) | ORAL | Status: AC
  Administered 2017-06-13: 1 mg via ORAL
  Filled 2017-06-12: qty 1

## 2017-06-12 MED ORDER — ADULT MULTIVITAMIN W/MINERALS CH
1.0000 | ORAL_TABLET | Freq: Every day | ORAL | Status: DC
  Administered 2017-06-12 – 2017-06-17 (×6): 1 via ORAL
  Filled 2017-06-12 (×6): qty 1

## 2017-06-12 MED ORDER — DEXTROSE 5 % IV SOLN
1.0000 g | INTRAVENOUS | Status: DC
  Administered 2017-06-12 – 2017-06-13 (×2): 1 g via INTRAVENOUS
  Filled 2017-06-12 (×3): qty 10

## 2017-06-12 MED ORDER — VITAMIN B-1 100 MG PO TABS
100.0000 mg | ORAL_TABLET | Freq: Every day | ORAL | Status: DC
  Administered 2017-06-12 – 2017-06-17 (×6): 100 mg via ORAL
  Filled 2017-06-12 (×6): qty 1

## 2017-06-12 MED ORDER — PNEUMOCOCCAL VAC POLYVALENT 25 MCG/0.5ML IJ INJ
0.5000 mL | INJECTION | INTRAMUSCULAR | Status: DC
  Filled 2017-06-12: qty 0.5

## 2017-06-12 MED ORDER — SENNOSIDES-DOCUSATE SODIUM 8.6-50 MG PO TABS
1.0000 | ORAL_TABLET | Freq: Every evening | ORAL | Status: DC | PRN
Start: 2017-06-12 — End: 2017-06-17

## 2017-06-12 MED ORDER — SODIUM CHLORIDE 0.9 % IV SOLN
INTRAVENOUS | Status: AC
  Administered 2017-06-12 (×2): via INTRAVENOUS

## 2017-06-12 MED ORDER — SODIUM CHLORIDE 0.9 % IV BOLUS (SEPSIS)
500.0000 mL | Freq: Once | INTRAVENOUS | Status: AC
  Administered 2017-06-12: 500 mL via INTRAVENOUS

## 2017-06-12 MED ORDER — MORPHINE SULFATE (PF) 4 MG/ML IV SOLN
4.0000 mg | Freq: Once | INTRAVENOUS | Status: AC
  Administered 2017-06-12: 4 mg via INTRAVENOUS
  Filled 2017-06-12: qty 1

## 2017-06-12 MED ORDER — LORAZEPAM 1 MG PO TABS: 0.0000 mg | ORAL_TABLET | Freq: Two times a day (BID) | ORAL | Status: AC

## 2017-06-12 MED ORDER — FOLIC ACID 1 MG PO TABS
1.0000 mg | ORAL_TABLET | Freq: Every day | ORAL | Status: DC
  Administered 2017-06-12 – 2017-06-17 (×6): 1 mg via ORAL
  Filled 2017-06-12 (×6): qty 1

## 2017-06-12 MED ORDER — CEFTRIAXONE SODIUM 1 G IJ SOLR
1.0000 g | Freq: Once | INTRAMUSCULAR | Status: AC
  Administered 2017-06-12: 1 g via INTRAVENOUS
  Filled 2017-06-12: qty 10

## 2017-06-12 MED ORDER — ONDANSETRON HCL 4 MG/2ML IJ SOLN: 4.0000 mg | Freq: Four times a day (QID) | INTRAMUSCULAR | Status: DC | PRN

## 2017-06-12 MED ORDER — CILOSTAZOL 50 MG PO TABS
50.0000 mg | ORAL_TABLET | Freq: Two times a day (BID) | ORAL | Status: DC
  Administered 2017-06-12 – 2017-06-17 (×11): 50 mg via ORAL
  Filled 2017-06-12 (×14): qty 1

## 2017-06-12 MED ORDER — AMLODIPINE BESYLATE 10 MG PO TABS
10.0000 mg | ORAL_TABLET | Freq: Every day | ORAL | Status: DC
  Administered 2017-06-12 – 2017-06-17 (×6): 10 mg via ORAL
  Filled 2017-06-12 (×6): qty 1

## 2017-06-12 MED ORDER — ACETAMINOPHEN 325 MG PO TABS
650.0000 mg | ORAL_TABLET | Freq: Four times a day (QID) | ORAL | Status: DC | PRN
  Administered 2017-06-12: 650 mg via ORAL
  Filled 2017-06-12: qty 2

## 2017-06-12 MED ORDER — HYDROCODONE-ACETAMINOPHEN 5-325 MG PO TABS
1.0000 | ORAL_TABLET | ORAL | Status: DC | PRN
  Administered 2017-06-12 (×2): 2 via ORAL
  Administered 2017-06-12 (×2): 1 via ORAL
  Administered 2017-06-13: 2 via ORAL
  Filled 2017-06-12: qty 2
  Filled 2017-06-12: qty 1
  Filled 2017-06-12 (×2): qty 2
  Filled 2017-06-12: qty 1

## 2017-06-12 MED ORDER — ATORVASTATIN CALCIUM 40 MG PO TABS
40.0000 mg | ORAL_TABLET | Freq: Every day | ORAL | Status: DC
  Administered 2017-06-12 – 2017-06-16 (×5): 40 mg via ORAL
  Filled 2017-06-12 (×5): qty 1

## 2017-06-12 MED ORDER — THIAMINE HCL 100 MG/ML IJ SOLN: 100.0000 mg | Freq: Every day | INTRAMUSCULAR | Status: DC

## 2017-06-12 MED ORDER — LORAZEPAM 1 MG PO TABS: 1.0000 mg | ORAL_TABLET | Freq: Four times a day (QID) | ORAL | Status: AC | PRN

## 2017-06-12 MED ORDER — BISACODYL 5 MG PO TBEC
5.0000 mg | DELAYED_RELEASE_TABLET | Freq: Every day | ORAL | Status: DC | PRN
  Filled 2017-06-12: qty 1

## 2017-06-12 NOTE — Progress Notes (Signed)
I have seen and assessed patient and agree with Dr.Opyd's assessment and plan. Patient is a 71 year old gentleman with history of bladder calculi, COPD with ongoing tobacco abuse, hypertension, history of long standing voiding dysfunction, history of lower urinary tract obstructive symptomology presenting with gross hematuria, lower abdominal pain, dysuria, fever, chills, recent weight loss. CT abdomen and pelvis with thick-walled bladder and multiple bladder calculi. Urinalysis worrisome for UTI. Urine cultures pending. Patient placed empirically on IV antibiotics. Consult with urology for further evaluation and management.  No charge.

## 2017-06-12 NOTE — ED Provider Notes (Signed)
[facility]-EMERGENCY DEPT Provider Note   CSN: [id number] Arrival date & time: 06/11/17  4098     History   Chief Complaint Chief Complaint  Patient presents with  . Hematuria  . Dysuria    HPI Joe Durham is a 71 y.o. male who presents with a persistent history of intermittent dysuria and hematuria worsened in the last 3 days. Patient reports that for approximately the last one and half months he has been experiencing intermittent dysuria and hematuria. He notes that sometimes he has gross blood with urination and other times he has just had the presence of clots. Patient also reports that he has intermittent difficulty with urination. He reports that he often feels like he has to go and experiences increased urinary frequency but often times is only able to dribble or have small amounts. Patient also notes that over the last 3 days he has had significant more pain with urination. Patient states that it feels like "sharp pins and needles whenever he attempts pee." Patient also notes that over the last 3 days he has had some bilateral flank pain and lower abdominal pain that has been intermittent. He has not been taking any medications for the pain. Patient also reports that over the last few weeks he has had some weight loss but is unsure the exact amount. Patient also reports that he has been having some intermittent night sweats. Patient denies any fever, chest pain, difficulty breathing, vomiting, numbness/weakness of his extremities. Patient denies any penile pain, testicular pain, scrotal swelling.  The history is provided by the patient.    Past Medical History:  Diagnosis Date  . Bladder calculi   . COPD (chronic obstructive pulmonary disease) (HCC)   . Diplopia    PT STATES WHEN LYING DOWN HE SEES DOUPLE ,  WHEN HE SITS UP IT CLEARS UP  . Fracture of nasal bone    03-20-2014   MILD COMMUNIUTED  . Hypertension   . Renal insufficiency 06/12/2017  . Short of breath on exertion   .  Wears glasses   . Wrist fracture, left    S/P  ORIF  03-20-2014--  has hard cast on    Patient Active Problem List   Diagnosis Date Noted  . Hematuria 06/12/2017  . Renal insufficiency 06/12/2017  . UTI (urinary tract infection) 06/12/2017  . Claudication (HCC) 11/14/2016  . Encounter for loop recorder check 07/19/2015  . First degree AV block 07/19/2015  . Paroxysmal atrial tachycardia (HCC) 07/19/2015  . Essential hypertension 04/05/2015  . Tobacco use disorder 04/05/2015  . Cocaine abuse 04/05/2015  . Hospital discharge follow-up 01/10/2015  . 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 Lung mass   . Acute embolic stroke (HCC) 01/04/2015  . Cerebral infarction due to unspecified mechanism   . Visual changes   . Substance abuse   . Hyperlipidemia   . CVA (cerebral infarction) 01/03/2015  . Tobacco abuse 04/12/2014  . Bladder stones 04/12/2014  . Uncontrolled hypertension 03/20/2014    Past Surgical History:  Procedure Laterality Date  . INGUINAL HERNIA REPAIR Right 2011  . LOOP RECORDER IMPLANT N/A 01/05/2015   Procedure: LOOP RECORDER IMPLANT;  Surgeon: Thurmon Fair, MD;  Location: [facility] CATH LAB;  Service: Cardiovascular;  Laterality: N/A;  . ORIF WRIST FRACTURE Left 03/20/2014   Procedure: OPEN REDUCTION INTERNAL FIXATION (ORIF) WRIST FRACTURE;  Surgeon: Dominica Severin, MD;  Location: [facility] OR;  Service: Orthopedics;  Laterality: Left;  . TEE WITHOUT  CARDIOVERSION N/A 01/05/2015   Procedure: TRANSESOPHAGEAL ECHOCARDIOGRAM (TEE)/LOOP;  Surgeon: Thurmon Fair, MD;  Location: [facility] ENDOSCOPY;  Service: Cardiovascular;  Laterality: N/A;       Home Medications    Prior to Admission medications   Medication Sig Start Date End Date Taking? Authorizing Provider  acetaminophen (TYLENOL) 325 MG tablet Take 650 mg by mouth every 6 (six) hours as needed for mild pain.   Yes [provider]  aspirin-sod bicarb-citric acid (ALKA-SELTZER) 325  MG TBEF tablet Take 325 mg by mouth every 6 (six) hours as needed (pain).   Yes [provider]  ibuprofen (ADVIL,MOTRIN) 200 MG tablet Take 400 mg by mouth every 6 (six) hours as needed for moderate pain.   Yes [provider]  naproxen sodium (ANAPROX) 220 MG tablet Take 440 mg by mouth 2 (two) times daily with a meal.   Yes [provider]  amLODipine (NORVASC) 10 MG tablet Take 1 tablet (10 mg total) by mouth daily. Please keep 08/28/16 appt for further refills Patient not taking: Reported on 06/11/2017 11/02/16   Croitoru, Mihai, MD  atorvastatin (LIPITOR) 40 MG tablet Take 1 tablet (40 mg total) by mouth daily at 6 PM. Patient not taking: Reported on 06/11/2017 08/28/16   Croitoru, Mihai, MD  cilostazol (PLETAL) 50 MG tablet Take 1 tablet (50 mg total) by mouth 2 (two) times daily. Patient not taking: Reported on 06/11/2017 11/14/16   Runell Gess, MD    Family History Family History  Problem Relation Age of Onset  . Stroke Mother   . Stroke Sister     Social History Social History  Substance Use Topics  . Smoking status: Current Every Day Smoker    Packs/day: 0.25    Years: 52.00    Types: Cigarettes  . Smokeless tobacco: Never Used     Comment: pt is down to 3 cig per day from 1 ppd  . Alcohol use 0.0 oz/week     Comment: " a fifth and a half a month", 04/05/15 1 pint every week     Allergies   Patient has no known allergies.   Review of Systems Review of Systems  Constitutional: Positive for diaphoresis and unexpected weight change. Negative for fever.  Respiratory: Negative for cough and shortness of breath.   Cardiovascular: Negative for chest pain.  Gastrointestinal: Positive for abdominal pain. Negative for diarrhea, nausea and vomiting.  Genitourinary: Positive for difficulty urinating, dysuria, flank pain and frequency. Negative for penile pain, scrotal swelling and testicular pain.  Musculoskeletal: Negative for back pain and neck  pain.  Neurological: Negative for weakness, numbness and headaches.  All other systems reviewed and are negative.    Physical Exam Updated Vital Signs BP (!) 155/78 (BP Location: Left Arm)   Pulse 98   Temp 97.7 F (36.5 C) (Oral)   Resp 20   SpO2 93%   Physical Exam  Constitutional: He is oriented to person, place, and time. He appears well-developed and well-nourished.  Appears uncomfortable but no acute distress   HENT:  Head: Normocephalic and atraumatic.  Mouth/Throat: Oropharynx is clear and moist and mucous membranes are normal.  Eyes: Pupils are equal, round, and reactive to light. Conjunctivae, EOM and lids are normal.  Arcus senilis bilaterally  Neck: Full passive range of motion without pain.  Cardiovascular: Normal rate, regular rhythm, normal heart sounds and normal pulses.   Pulses:      Dorsalis pedis pulses are 2+ on the right side, and 2+  on the left side.  Pulmonary/Chest: Effort normal and breath sounds normal.  No evidence of respiratory distress. Able to speak in full sentences without difficulty.  Abdominal: Soft. Normal appearance and bowel sounds are normal. He exhibits no distension. There is tenderness in the right lower quadrant, suprapubic area and left lower quadrant. There is CVA tenderness. There is no rigidity and no guarding. Hernia confirmed negative in the right inguinal area and confirmed negative in the left inguinal area.  Abdomen soft, nondistended. He has diffuse tenderness to the lower abdominal/suprapubic region. Positive CVA tenderness bilaterally.  Genitourinary: Testes normal and penis normal. Right testis shows no swelling and no tenderness. Left testis shows no swelling and no tenderness. Uncircumcised. No penile erythema. No discharge found.  Genitourinary Comments: The exam was performed with a chaperone present. Normal external male genitalia. Penis is uncircumcised. No swelling, erythema, tenderness of bilateral testicles. On rectal  exam, there is questionable mass as there was difficulty advancing the digit into the rectum and question whether or not there is something pushing up against rectal. There is no evidence of fecal impaction.  Musculoskeletal: Normal range of motion.  Neurological: He is alert and oriented to person, place, and time. GCS eye subscore is 4. GCS verbal subscore is 5. GCS motor subscore is 6.  Follows commands, Moves all extremities  5/5 strength to BUE and BLE  Sensation intact throughout   Skin: Skin is warm and dry. Capillary refill takes less than 2 seconds.  Psychiatric: He has a normal mood and affect. His speech is normal.  Nursing note and vitals reviewed.    ED Treatments / Results  Labs (all labs ordered are listed, but only abnormal results are displayed) Labs Reviewed  COMPREHENSIVE METABOLIC PANEL - Abnormal; Notable for the following:       Result Value   BUN 21 (*)    Creatinine, Ser 1.39 (*)    Total Protein 8.5 (*)    ALT 10 (*)    GFR calc non Af Amer 50 (*)    GFR calc Af Amer 58 (*)    All other components within normal limits  CBC - Abnormal; Notable for the following:    WBC 13.8 (*)    RBC 3.82 (*)    MCV 104.5 (*)    MCH 36.4 (*)    All other components within normal limits  URINALYSIS, ROUTINE W REFLEX MICROSCOPIC - Abnormal; Notable for the following:    APPearance TURBID (*)    Hgb urine dipstick LARGE (*)    Protein, ur >=300 (*)    Leukocytes, UA LARGE (*)    Bacteria, UA MANY (*)    All other components within normal limits  URINE CULTURE  LIPASE, BLOOD  CBC WITH DIFFERENTIAL/PLATELET  BASIC METABOLIC PANEL    EKG  EKG Interpretation None       Radiology Ct Abdomen Pelvis W Contrast  Result Date: 06/12/2017 CLINICAL DATA:  Acute onset of hematuria and penile pain, worse with urination. Chronic lower abdominal pain. Weight loss, fever and diaphoresis. Initial encounter. EXAM: CT ABDOMEN AND PELVIS WITH CONTRAST TECHNIQUE: Multidetector CT  imaging of the abdomen and pelvis was performed using the standard protocol following bolus administration of intravenous contrast. CONTRAST:  80mL ISOVUE-300 IOPAMIDOL (ISOVUE-300) INJECTION 61% COMPARISON:  CT of the abdomen and pelvis performed 03/20/2014 FINDINGS: Lower chest: The visualized lung bases are grossly clear. The visualized portions of the mediastinum are unremarkable. Hepatobiliary: The liver is unremarkable in appearance. The gallbladder  is unremarkable in appearance. The common bile duct remains normal in caliber. Pancreas: The pancreas is within normal limits. Spleen: The spleen is unremarkable in appearance. Adrenals/Urinary Tract: The adrenal glands are unremarkable in appearance. There is minimal left-sided hydronephrosis, without definite evidence of distal obstructing stone. This may reflect chronic bladder inflammation. The right kidney is unremarkable in appearance. No renal or ureteral stones are identified. No perinephric stranding is seen. Stomach/Bowel: The stomach is unremarkable in appearance. The small bowel is within normal limits. The appendix is normal in caliber, without evidence of appendicitis. The colon is unremarkable in appearance. Vascular/Lymphatic: Scattered calcification is seen along the abdominal aorta and its branches. The abdominal aorta is otherwise grossly unremarkable. The inferior vena cava is grossly unremarkable. No retroperitoneal lymphadenopathy is seen. No pelvic sidewall lymphadenopathy is identified. Reproductive: Numerous large stones are seen dependently within the bladder. 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 Mild surrounding soft tissue inflammation is noted at the dome of the bladder. The prostate remains borderline normal in size. Other: No additional soft tissue abnormalities are seen. Musculoskeletal: No acute osseous abnormalities are identified. The patient's  right hip arthroplasty is grossly unremarkable in appearance, though incompletely imaged. The visualized musculature is unremarkable in appearance. IMPRESSION: 1. 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 Underlying mass cannot be entirely excluded. The degree of wall thickening is significantly worsened from 2015. Numerous large stones seen dependently within the bladder. Mild surrounding soft tissue inflammation at the dome of the bladder. 2. Minimal left-sided hydronephrosis, without definite evidence of distal obstructing stone. This may reflect the chronic bladder inflammation. 3. Scattered aortic atherosclerosis. Electronically Signed   By: Roanna Raider M.D.   On: 06/12/2017 00:19    Procedures Procedures (including critical care time)  Medications Ordered in ED Medications  acetaminophen (TYLENOL) tablet 650 mg (not administered)  0.9 %  sodium chloride infusion ( Intravenous New Bag/Given 06/12/17 0139)  HYDROcodone-acetaminophen (NORCO/VICODIN) 5-325 MG per tablet 1-2 tablet (not administered)  senna-docusate (Senokot-S) tablet 1 tablet (not administered)  bisacodyl (DULCOLAX) EC tablet 5 mg (not administered)  ondansetron (ZOFRAN) tablet 4 mg (not administered)    Or  ondansetron (ZOFRAN) injection 4 mg (not administered)  cefTRIAXone (ROCEPHIN) 1 g in dextrose 5 % 50 mL IVPB (not administered)  iopamidol (ISOVUE-300) 61 % injection 100 mL (80 mLs Intravenous Contrast Given 06/11/17 2344)  sodium chloride 0.9 % bolus 500 mL (500 mLs Intravenous New Bag/Given 06/12/17 0107)  cefTRIAXone (ROCEPHIN) 1 g in dextrose 5 % 50 mL IVPB (1 g Intravenous New Bag/Given 06/12/17 0107)  morphine 4 MG/ML injection 4 mg (4 mg Intravenous Given 06/12/17 0108)     Initial Impression / Assessment and Plan / ED Course  I have reviewed the triage vital signs and the nursing notes.  Pertinent labs & imaging results that were available  during my care of the patient were reviewed by me and considered in my medical decision making (see chart for details).     71 year old male who presents with a month and half long history of intermittent dysuria and hematuria that has progressively worsened in last 2 days. Additionally noted some lower abdominal pain and bilateral CVA tendernessin the last 3 days. Patient is afebrile, non-toxic appearing, sitting comfortably on examination table. Vital signs reviewed and stable. Physical exam shows some diffuse tenderness to the lower abdomen and bilateral CVA tenderness. Rectal exam is questionable for  potential mass as there is evidence that there may be something pushing up against the rectal wall. There is no evidence of fecal impaction. Consider UTI versus prostatitis versus prostate cancer given physical exam findings and history of night sweats and weight loss. Initial labs ordered at triage including, CMP, CBC, UA, lipase. Labs and imaging reviewed. CMP shows mild bump in BUN/creatinine. CBC shows mild leukocytosis. UA is positive for large hemoglobin, protein, leukocytes, too numerous to count white blood cell count and many bacteria.  Will plan on CT abdomen/pelvis for evaluation.  CT abdomen and pelvis reviewed. There is diffuse irregular bladder wall thickening or underlying mass cannot be excluded. There is also mention of numerous large stones seen dependently within the bladder with mild soft tissue inflammation of the dome of the bladder. There is mention of minimal left-sided hydronephrosis. Given the CT findings, there is concern that there may be an infected bladder stone or a infiltration of the bladder into the rectum. Records reviewed shows there is a diagnosis of bladder stones in 2015 but there does not appear to be any urology consult.  Discussed results with patient. Will likely need admission for further evaluation. Will plan to consult urology.  Discussed with Dr. Thea Silversmith on  call for urology. Recommend starting Rocephin in the department and send a urine culture. Recommends antibiotic treatment that is cultured specific 14 days. Will consult during hospital admission if patient has any issues with urinary retention. Otherwise will plan to see patient on outpatient basis after completion of antibiotic therapy. Will consult hospitalist for admission.   Discussed with hospitalist. Will admit.    Final Clinical Impressions(s) / ED Diagnoses   Final diagnoses:  Acute cystitis with hematuria  Bladder stones  Acute kidney injury [facility])    New Prescriptions New Prescriptions   No medications on file     Rosana Hoes 06/12/17 [id number]    Shaune Pollack, MD 06/12/17 [phone]

## 2017-06-12 NOTE — H&P (Signed)
History and Physical    Joe Durham ZOX:[id number] DOB: [birth year] DOA: 06/11/2017  PCP: Howard Pouch, MD   Patient coming from: Home  Chief Complaint: Hematuria, dysuria   HPI: Joe Durham is a 71 y.o. male with medical history significant for hypertension, polysubstance abuse, and bladder stones, now presented to the emergency department for evaluation of gross hematuria and dysuria. Patient reports that he's been experiencing suprapubic tenderness, hematuria, and dysuria intermittently for the past 3 months or so. He also reports some weight loss, subjective fevers, and night sweats over the same interval. Symptoms returned several days ago and had been becoming more severe over the past couple days. He denies chest pain or palpitations and denies any significant dyspnea or cough. He does not take blood thinners. He reports some mild intermittent flank pain bilaterally. Denies recent antibiotics. He is a current smoker.  ED Course: Upon arrival to the ED, patient is found to be afebrile, saturating well on room air, mildly tachycardic, and with vitals otherwise stable. Chemistry panel reveals a creatinine 1.39 with no recent prior available for comparison. CBC is notable for a leukocytosis to 13,800. Urinalysis suggests infection. CT of the abdomen and pelvis demonstrates diffuse irregular bladder wall thickening with underlying mass not excluded. Also noted on CT is large stones in the bladder and soft tissue inflammation about the dome of the bladder as well as minimal left-sided hydronephrosis without any definite obstructing stone identified, and possibly secondary to the chronic bladder wall thickening. Urology was consulted by the ED physician and advised treating the UTI and sending urine for culture. Urine was sent for culture, 500 mL normal saline was given, 4 mg IV morphine was administered, and the patient was started on empiric Rocephin. Tachycardia resolved with the IV fluids,  patient remained stable in the ED, and he will be observed on the medical-surgical unit for ongoing evaluation and management UTI with gross hematuria and bladder wall thickening concerning for cystitis versus underlying mass.  Review of Systems:  All other systems reviewed and apart from HPI, are negative.  Past Medical History:  Diagnosis Date  . Bladder calculi   . COPD (chronic obstructive pulmonary disease) (HCC)   . Diplopia    PT STATES WHEN LYING DOWN HE SEES DOUPLE ,  WHEN HE SITS UP IT CLEARS UP  . Fracture of nasal bone    03-20-2014   MILD COMMUNIUTED  . Hypertension   . Renal insufficiency 06/12/2017  . Short of breath on exertion   . Wears glasses   . Wrist fracture, left    S/P  ORIF  03-20-2014--  has hard cast on    Past Surgical History:  Procedure Laterality Date  . INGUINAL HERNIA REPAIR Right 2011  . LOOP RECORDER IMPLANT N/A 01/05/2015   Procedure: LOOP RECORDER IMPLANT;  Surgeon: Thurmon Fair, MD;  Location: [facility];  Service: Cardiovascular;  Laterality: N/A;  . ORIF WRIST FRACTURE Left 03/20/2014   Procedure: OPEN REDUCTION INTERNAL FIXATION (ORIF) WRIST FRACTURE;  Surgeon: Dominica Severin, MD;  Location: [facility] OR;  Service: Orthopedics;  Laterality: Left;  . TEE WITHOUT CARDIOVERSION N/A 01/05/2015   Procedure: TRANSESOPHAGEAL ECHOCARDIOGRAM (TEE)/LOOP;  Surgeon: Thurmon Fair, MD;  Location: [facility]OS[facility];  Service: Cardiovascular;  Laterality: N/A;     reports that he has been smoking Cigarettes.  He has a 13.00 pack-year smoking history. He has never used smokeless tobacco. He reports that he drinks alcohol. He reports that he does not use drugs.  No Known  Allergies  Family History  Problem Relation Age of Onset  . Stroke Mother   . Stroke Sister      Prior to Admission medications   Medication Sig Start Date End Date Taking? Authorizing Provider  acetaminophen (TYLENOL) 325 MG tablet Take 650 mg by mouth every 6 (six) hours as needed for mild  pain.   Yes [provider]  aspirin-sod bicarb-citric acid (ALKA-SELTZER) 325 MG TBEF tablet Take 325 mg by mouth every 6 (six) hours as needed (pain).   Yes [provider]  ibuprofen (ADVIL,MOTRIN) 200 MG tablet Take 400 mg by mouth every 6 (six) hours as needed for moderate pain.   Yes [provider]  naproxen sodium (ANAPROX) 220 MG tablet Take 440 mg by mouth 2 (two) times daily with a meal.   Yes [provider]  amLODipine (NORVASC) 10 MG tablet Take 1 tablet (10 mg total) by mouth daily. Please keep 08/28/16 appt for further refills Patient not taking: Reported on 06/11/2017 11/02/16   Croitoru, Mihai, MD  atorvastatin (LIPITOR) 40 MG tablet Take 1 tablet (40 mg total) by mouth daily at 6 PM. Patient not taking: Reported on 06/11/2017 08/28/16   Croitoru, Mihai, MD  cilostazol (PLETAL) 50 MG tablet Take 1 tablet (50 mg total) by mouth 2 (two) times daily. Patient not taking: Reported on 06/11/2017 11/14/16   Runell Gess, MD    Physical Exam: Vitals:   06/11/17 1911 06/11/17 2237 06/11/17 2257 06/12/17 0052  BP: 117/81 (!) 142/79  (!) 155/78  Pulse: (!) 109 67  98  Resp: 16 18  20   Temp: 97.7 F (36.5 C)     TempSrc: Oral     SpO2: 98% 100% 100% 93%      Constitutional: NAD, calm, appears uncomfortable.  Eyes: PERTLA, lids and conjunctivae normal ENMT: Mucous membranes are moist. Posterior pharynx clear of any exudate or lesions.   Neck: normal, supple, no masses, no thyromegaly Respiratory: Slightly diminished bilaterally, occasional wheeze. Normal respiratory effort.   Cardiovascular: S1 & S2 heard, regular rate and rhythm. No extremity edema. No significant JVD. Abdomen: No distension, suprapubic tenderness, no rebound pain or guarding, no masses palpated. Bowel sounds normal.  Musculoskeletal: no clubbing / cyanosis. No joint deformity upper and lower extremities.   Skin: no significant rashes, lesions, ulcers. Warm, dry,  well-perfused. Neurologic: CN 2-12 grossly intact. Sensation intact, DTR normal. Strength 5/5 in all 4 limbs.  Psychiatric: Alert and oriented x 3. Pleasant and cooperative.     Labs on Admission: I have personally reviewed following labs and imaging studies  CBC:  Recent Labs Lab 06/11/17 2016  WBC 13.8*  HGB 13.9  HCT 39.9  MCV 104.5*  PLT 337   Basic Metabolic Panel:  Recent Labs Lab 06/11/17 2016  NA 139  K 4.5  CL 103  CO2 26  GLUCOSE 95  BUN 21*  CREATININE 1.39*  CALCIUM 10.1   GFR: CrCl cannot be calculated (Unknown ideal weight.). Liver Function Tests:  Recent Labs Lab 06/11/17 2016  AST 15  ALT 10*  ALKPHOS 57  BILITOT 0.7  PROT 8.5*  ALBUMIN 4.0    Recent Labs Lab 06/11/17 2016  LIPASE 28   No results for input(s): AMMONIA in the last 168 hours. Coagulation Profile: No results for input(s): INR, PROTIME in the last 168 hours. Cardiac Enzymes: No results for input(s): CKTOTAL, CKMB, CKMBINDEX, TROPONINI in the last 168 hours. BNP (last 3 results) No results for input(s): PROBNP in  the last 8760 hours. HbA1C: No results for input(s): HGBA1C in the last 72 hours. CBG: No results for input(s): GLUCAP in the last 168 hours. Lipid Profile: No results for input(s): CHOL, HDL, LDLCALC, TRIG, CHOLHDL, LDLDIRECT in the last 72 hours. Thyroid Function Tests: No results for input(s): TSH, T4TOTAL, FREET4, T3FREE, THYROIDAB in the last 72 hours. Anemia Panel: No results for input(s): VITAMINB12, FOLATE, FERRITIN, TIBC, IRON, RETICCTPCT in the last 72 hours. Urine analysis:    Component Value Date/Time   COLORURINE YELLOW 06/11/2017 2026   APPEARANCEUR TURBID (A) 06/11/2017 2026   LABSPEC 1.014 06/11/2017 2026   PHURINE 5.0 06/11/2017 2026   GLUCOSEU NEGATIVE 06/11/2017 2026   HGBUR LARGE (A) 06/11/2017 2026   BILIRUBINUR NEGATIVE 06/11/2017 2026   KETONESUR NEGATIVE 06/11/2017 2026   PROTEINUR >=300 (A) 06/11/2017 2026   UROBILINOGEN 1.0  03/20/2014 1241   NITRITE NEGATIVE 06/11/2017 2026   LEUKOCYTESUR LARGE (A) 06/11/2017 2026   Sepsis Labs: @LABRCNTIP (procalcitonin:4,lacticidven:4) )No results found for this or any previous visit (from the past 240 hour(s)).   Radiological Exams on Admission: Ct Abdomen Pelvis W Contrast  Result Date: 06/12/2017 CLINICAL DATA:  Acute onset of hematuria and penile pain, worse with urination. Chronic lower abdominal pain. Weight loss, fever and diaphoresis. Initial encounter. EXAM: CT ABDOMEN AND PELVIS WITH CONTRAST TECHNIQUE: Multidetector CT imaging of the abdomen and pelvis was performed using the standard protocol following bolus administration of intravenous contrast. CONTRAST:  85mL ISOVUE-300 IOPAMIDOL (ISOVUE-300) INJECTION 61% COMPARISON:  CT of the abdomen and pelvis performed 03/20/2014 FINDINGS: Lower chest: The visualized lung bases are grossly clear. The visualized portions of the mediastinum are unremarkable. Hepatobiliary: The liver is unremarkable in appearance. The gallbladder is unremarkable in appearance. The common bile duct remains normal in caliber. Pancreas: The pancreas is within normal limits. Spleen: The spleen is unremarkable in appearance. Adrenals/Urinary Tract: The adrenal glands are unremarkable in appearance. There is minimal left-sided hydronephrosis, without definite evidence of distal obstructing stone. This may reflect chronic bladder inflammation. The right kidney is unremarkable in appearance. No renal or ureteral stones are identified. No perinephric stranding is seen. Stomach/Bowel: The stomach is unremarkable in appearance. The small bowel is within normal limits. The appendix is normal in caliber, without evidence of appendicitis. The colon is unremarkable in appearance. Vascular/Lymphatic: Scattered calcification is seen along the abdominal aorta and its branches. The abdominal aorta is otherwise grossly unremarkable. The inferior vena cava is grossly  unremarkable. No retroperitoneal lymphadenopathy is seen. No pelvic sidewall lymphadenopathy is identified. Reproductive: Numerous large stones are seen dependently within the bladder. 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 Mild surrounding soft tissue inflammation is noted at the dome of the bladder. The prostate remains borderline normal in size. Other: No additional soft tissue abnormalities are seen. Musculoskeletal: No acute osseous abnormalities are identified. The patient's right hip arthroplasty is grossly unremarkable in appearance, though incompletely imaged. The visualized musculature is unremarkable in appearance. IMPRESSION: 1. 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 Underlying mass cannot be entirely excluded. The degree of wall thickening is significantly worsened from 2015. Numerous large stones seen dependently within the bladder. Mild surrounding soft tissue inflammation at the dome of the bladder. 2. Minimal left-sided hydronephrosis, without definite evidence of distal obstructing stone. This may reflect the chronic bladder inflammation. 3. Scattered aortic atherosclerosis. Electronically Signed   By: Roanna Raider M.D.   On: 06/12/2017  00:19    EKG: Not performed.   Assessment/Plan  1. UTI  - Pt presents with gross hematuria and dysuria  - Found to have leukocytosis and purulent urine  - Urine sent for culture and empiric Rocephin administered  - Plan to continue empiric abx pending culture data    2. Hematuria  - Secondary to UTI, stones, or possibly malignancy  - Treat UTI as above  - Urology follow-up    3. Hypertension  - BP is at goal  - Continue Norvasc   4. Kidney disease  - SCr is 1.39 on admission, with no recent prior available  - Minimal left-sided hydronephrosis noted on CT abd/pelvis, but no definite  obstruction identified  - He appears hypovolemic on admission and will be hydrated with NS  - Repeat chem panel in am   5. Polysubstance abuse  - Pt reports avoiding illicit substances lately, but continues to drink alcohol daily  - No signs of withdrawal on admission  - Monitor with CIWA and prn Ativan    DVT prophylaxis: SCD's  Code Status: Full  Family Communication: Niece updated at bedside with patient's permission  Disposition Plan: Observe on med-surg Consults called: Urology  Admission status: Observation    Briscoe Deutscher, MD [facility] Pager [phone]  If 7PM-7AM, please contact night-coverage [email or web address] Password [email or web address]  06/12/2017, 1:14 AM

## 2017-06-12 NOTE — ED Notes (Signed)
Joe Durham  [phone] cell [phone] work  Family member contact

## 2017-06-12 NOTE — Consult Note (Signed)
Urology Consult  Consulting ZO:XWRUEA Janee Morn, M.D.  CC: urinary tract infection, bladder stones  HPI: This is a 71 year old male with long-standing voiding dysfunction, not regularly seen by urologist.  Years ago, he did not show for cystolitholapaxy, which was scheduled to be done by Dr. Brunilda Payor.  The patient has significant lower urinary tract symptomatology, leakage, both day and night, slow stream, and other obstructive symptomatology.  He was admitted recently for gross hematuria, abdominal complaints, dysuria and lower urinary tract symptomatology.  He has had fevers, chills.  In the emergency room, he was found to have gross hematuria, obvious UTI, distended bladder and multiple bladder calculi.  Urologic consultation is requested.  PMH: Past Medical History:  Diagnosis Date  . Bladder calculi   . COPD (chronic obstructive pulmonary disease) (HCC)   . Diplopia    PT STATES WHEN LYING DOWN HE SEES DOUPLE ,  WHEN HE SITS UP IT CLEARS UP  . Fracture of nasal bone    03-20-2014   MILD COMMUNIUTED  . Hypertension   . Renal insufficiency 06/12/2017  . Short of breath on exertion   . Wears glasses   . Wrist fracture, left    S/P  ORIF  03-20-2014--  has hard cast on    PSH: Past Surgical History:  Procedure Laterality Date  . INGUINAL HERNIA REPAIR Right 2011  . LOOP RECORDER IMPLANT N/A 01/05/2015   Procedure: LOOP RECORDER IMPLANT;  Surgeon: Thurmon Fair, MD;  Location: [facility];  Service: Cardiovascular;  Laterality: N/A;  . ORIF WRIST FRACTURE Left 03/20/2014   Procedure: OPEN REDUCTION INTERNAL FIXATION (ORIF) WRIST FRACTURE;  Surgeon: Dominica Severin, MD;  Location: [facility] OR;  Service: Orthopedics;  Laterality: Left;  . TEE WITHOUT CARDIOVERSION N/A 01/05/2015   Procedure: TRANSESOPHAGEAL ECHOCARDIOGRAM (TEE)/LOOP;  Surgeon: Thurmon Fair, MD;  Location: [facility];  Service: Cardiovascular;  Laterality: N/A;    Allergies: No Known Allergies  Medications: Prescriptions  Prior to Admission  Medication Sig Dispense Refill Last Dose  . acetaminophen (TYLENOL) 325 MG tablet Take 650 mg by mouth every 6 (six) hours as needed for mild pain.   06/10/2017 at Unknown time  . aspirin-sod bicarb-citric acid (ALKA-SELTZER) 325 MG TBEF tablet Take 325 mg by mouth every 6 (six) hours as needed (pain).   06/10/2017 at Unknown time  . ibuprofen (ADVIL,MOTRIN) 200 MG tablet Take 400 mg by mouth every 6 (six) hours as needed for moderate pain.   06/10/2017 at Unknown time  . naproxen sodium (ANAPROX) 220 MG tablet Take 440 mg by mouth 2 (two) times daily with a meal.   06/11/2017 at Unknown time  . amLODipine (NORVASC) 10 MG tablet Take 1 tablet (10 mg total) by mouth daily. Please keep 08/28/16 appt for further refills (Patient not taking: Reported on 06/11/2017) 30 tablet 11 Not Taking at Unknown time  . atorvastatin (LIPITOR) 40 MG tablet Take 1 tablet (40 mg total) by mouth daily at 6 PM. (Patient not taking: Reported on 06/11/2017) 30 tablet 11 Not Taking at Unknown time  . cilostazol (PLETAL) 50 MG tablet Take 1 tablet (50 mg total) by mouth 2 (two) times daily. (Patient not taking: Reported on 06/11/2017) 60 tablet 3 Not Taking at Unknown time     Social History: Social History   Social History  . Marital status: Single    Spouse name: N/A  . Number of children: 4  . Years of education: 9   Occupational History  . retired  Holiday representative   Social History Main Topics  . Smoking status: Current Every Day Smoker    Packs/day: 0.25    Years: 52.00    Types: Cigarettes  . Smokeless tobacco: Never Used     Comment: pt is down to 3 cig per day from 1 ppd  . Alcohol use 0.0 oz/week     Comment: " a fifth and a half a month", 04/05/15 1 pint every week  . Drug use: No  . Sexual activity: Not Currently    Birth control/ protection: None   Other Topics Concern  . Not on file   Social History Narrative   Single   Right handed   Caffeine use - sodas on weekends with alcohol     Family History: Family History  Problem Relation Age of Onset  . Stroke Mother   . Stroke Sister     Review of Systems: Positive: urgency, frequency, slow stream, incomplete emptying, suprapubic discomfort, gross hematuria, dysuria, nocturia, urgency incontinence Negative: .  A further 10 point review of systems was negative except what is listed in the HPI.  Physical Exam: @VITALS2 @ General: No acute distress.  Awake. Head:  Normocephalic.  Atraumatic. ENT:  EOMI.  Mucous membranes moist Neck:  Supple.  No lymphadenopathy. CV:  S1 present. S2 present. Regular rate. Pulmonary: Equal effort bilaterally.  Clear to auscultation bilaterally. Abdomen: Soft.  Suprapubic area tender to palpation. Bladder was palpable. Skin:  Normal turgor.  No visible rash. Extremity: No gross deformity of bilateral upper extremities.  No gross deformity of bilateral lower extremities. Neurologic: Alert. Appropriate mood. Rectal:             Normal anal sphincter tone.  No rectal masses.  Prostate approximately 30 grams, nonnodular, nontender. Penis:  Uncircumcised.  No lesions. Urethra:   No Foley catheter in place.  Orthotopic meatus.  Scrotum: No lesions.  No ecchymosis.  No erythema. Testicles: Descended bilaterally.  No masses bilaterally. Epididymis: Palpable bilaterally.  Non Tender to palpation.  Studies:  Recent Labs     06/11/17  2016  06/12/17  0348  HGB  13.9  12.9*  WBC  13.8*  10.4  PLT  337  310    Recent Labs     06/11/17  2016  06/12/17  0348  NA  139  141  K  4.5  4.0  CL  103  106  CO2  26  27  BUN  21*  21*  CREATININE  1.39*  1.31*  CALCIUM  10.1  9.5  GFRNONAA  50*  54*  GFRAA  58*  >60    The above laboratory studies as well as his imaging studies were personally reviewed.  No results for input(s): INR, APTT in the last 72 hours.  Invalid input(s): PT   Invalid input(s): ABG    Assessment:  1.  Bladder dysfunction, possibly due to long-standing  bladder outlet obstruction.  He does have a thick-walled bladder on CT scan.  This most likely is due to hypertrophy from long-standing obstruction.  He does need eventual visualization of his bladder urothelium.  2.  Urinary tract infection.  3.  Multiple bladder calculi.  These currently do not need emergent management  Plan: I spoke with the patient at length about treatment.  At this point, with his bladder not emptying properly, I would recommend Foley catheter placement.  I will order that to be done tonight.  This will allow his bladder to decompress, and help his  infection clear more quickly.  I don't think he needs antibiotic management for more than about 10 days unless he has positive blood cultures.  I have recommended that he follow-up in our office.  He will eventually need flexible cystoscopy at the least.  I have told him that he needs his stones managed, and I think, more than likely his stones would be best managed by an open procedure, as he has multiple stones in it would take an extended length of time to clear him with a cystoscopy.  I will help arrange the patient to have follow-up in our office.  For now, treating him with antibiotics, and allowing him to go home with proper follow-up can be done without further urologic follow-up in the hospital.  Please call us if he does require further management as an inpatient.    Pager:[phone]

## 2017-06-12 NOTE — Progress Notes (Signed)
   06/12/17 0900  Clinical Encounter Type  Visited With Patient  Visit Type Initial;Psychological support;Spiritual support  Referral From Nurse  Consult/Referral To Chaplain  Spiritual Encounters  Spiritual Needs Emotional;Other (Comment);Brochure Engineer, building services )  Stress Factors  Patient Stress Factors Family relationships;Other (Comment) (Advance Directive Information )  Advance Directives (For Healthcare)  Does Patient Have a Medical Advance Directive? No  Would patient like information on creating a medical advance directive? Yes (Inpatient - patient requests chaplain consult to create a medical advance directive) (Please, refer to note)   I was referred to the patient per Spiritual Care consult about an Advance Directive.  The patient stated that he did not want an Advance Directive when we were discussing the document, but stated that he did not want his daughter who is his next of kin to make decisions in the event that he can't. The patient stated that his primary caregiver is his niece and that he wants her to make his healthcare decisions.  I informed about the laws and that he would need an Advance Directive naming his niece as his Midwife if he did not want his daughter to make decisions for him. I left the paperwork for him to review and told him to have the nurse page me if he wanted to create.   Please, contact Spiritual Care for further assistance.   Chaplain Clint Bolder M.Div.

## 2017-06-13 DIAGNOSIS — I10 Essential (primary) hypertension: Secondary | ICD-10-CM | POA: Diagnosis present

## 2017-06-13 DIAGNOSIS — N390 Urinary tract infection, site not specified: Secondary | ICD-10-CM | POA: Diagnosis not present

## 2017-06-13 DIAGNOSIS — N10 Acute tubulo-interstitial nephritis: Secondary | ICD-10-CM | POA: Diagnosis present

## 2017-06-13 DIAGNOSIS — Z2821 Immunization not carried out because of patient refusal: Secondary | ICD-10-CM | POA: Diagnosis not present

## 2017-06-13 DIAGNOSIS — F1020 Alcohol dependence, uncomplicated: Secondary | ICD-10-CM

## 2017-06-13 DIAGNOSIS — N319 Neuromuscular dysfunction of bladder, unspecified: Secondary | ICD-10-CM | POA: Diagnosis present

## 2017-06-13 DIAGNOSIS — N3001 Acute cystitis with hematuria: Secondary | ICD-10-CM | POA: Diagnosis present

## 2017-06-13 DIAGNOSIS — F17210 Nicotine dependence, cigarettes, uncomplicated: Secondary | ICD-10-CM | POA: Diagnosis present

## 2017-06-13 DIAGNOSIS — N200 Calculus of kidney: Secondary | ICD-10-CM | POA: Diagnosis present

## 2017-06-13 DIAGNOSIS — N179 Acute kidney failure, unspecified: Secondary | ICD-10-CM | POA: Diagnosis present

## 2017-06-13 DIAGNOSIS — Z681 Body mass index (BMI) 19 or less, adult: Secondary | ICD-10-CM | POA: Diagnosis not present

## 2017-06-13 DIAGNOSIS — Z791 Long term (current) use of non-steroidal anti-inflammatories (NSAID): Secondary | ICD-10-CM | POA: Diagnosis not present

## 2017-06-13 DIAGNOSIS — R634 Abnormal weight loss: Secondary | ICD-10-CM | POA: Diagnosis present

## 2017-06-13 DIAGNOSIS — N320 Bladder-neck obstruction: Secondary | ICD-10-CM | POA: Diagnosis present

## 2017-06-13 DIAGNOSIS — R310 Gross hematuria: Secondary | ICD-10-CM | POA: Diagnosis not present

## 2017-06-13 DIAGNOSIS — J449 Chronic obstructive pulmonary disease, unspecified: Secondary | ICD-10-CM | POA: Diagnosis present

## 2017-06-13 DIAGNOSIS — Z79899 Other long term (current) drug therapy: Secondary | ICD-10-CM | POA: Diagnosis not present

## 2017-06-13 DIAGNOSIS — N210 Calculus in bladder: Secondary | ICD-10-CM | POA: Diagnosis present

## 2017-06-13 LAB — URINE CULTURE

## 2017-06-13 LAB — CBC
HCT: 34.7 % — ABNORMAL LOW (ref 39.0–52.0)
Hemoglobin: 11.6 g/dL — ABNORMAL LOW (ref 13.0–17.0)
MCH: 35 pg — AB (ref 26.0–34.0)
MCHC: 33.4 g/dL (ref 30.0–36.0)
MCV: 104.8 fL — ABNORMAL HIGH (ref 78.0–100.0)
PLATELETS: 305 10*3/uL (ref 150–400)
RBC: 3.31 MIL/uL — ABNORMAL LOW (ref 4.22–5.81)
RDW: 12.5 % (ref 11.5–15.5)
WBC: 10.8 10*3/uL — ABNORMAL HIGH (ref 4.0–10.5)

## 2017-06-13 LAB — BASIC METABOLIC PANEL
Anion gap: 8 (ref 5–15)
BUN: 16 mg/dL (ref 6–20)
CO2: 24 mmol/L (ref 22–32)
CREATININE: 0.97 mg/dL (ref 0.61–1.24)
Calcium: 9 mg/dL (ref 8.9–10.3)
Chloride: 104 mmol/L (ref 101–111)
GFR calc Af Amer: >60 mL/min (ref >60)
Glucose, Bld: 102 mg/dL — ABNORMAL HIGH (ref 65–99)
Potassium: 3.5 mmol/L (ref 3.5–5.1)
SODIUM: 136 mmol/L (ref 135–145)

## 2017-06-13 MED ORDER — KETOROLAC TROMETHAMINE 30 MG/ML IJ SOLN
30.0000 mg | Freq: Four times a day (QID) | INTRAMUSCULAR | Status: DC | PRN
  Administered 2017-06-14 – 2017-06-17 (×2): 30 mg via INTRAVENOUS
  Filled 2017-06-13 (×2): qty 1

## 2017-06-13 MED ORDER — HYDROCODONE-ACETAMINOPHEN 5-325 MG PO TABS
1.0000 | ORAL_TABLET | ORAL | Status: DC | PRN
  Administered 2017-06-13 – 2017-06-16 (×7): 2 via ORAL
  Filled 2017-06-13 (×7): qty 2

## 2017-06-13 MED ORDER — POTASSIUM CHLORIDE CRYS ER 20 MEQ PO TBCR
40.0000 meq | EXTENDED_RELEASE_TABLET | Freq: Once | ORAL | Status: AC
  Administered 2017-06-13: 40 meq via ORAL
  Filled 2017-06-13: qty 2

## 2017-06-13 NOTE — Care Management Note (Signed)
Case Management Note  Patient Details  Name: Joe Durham MRN: [id number] Date of Birth: 1946/01/29  Subjective/Objective: 71 y/o m admitted w/Acute pyelonephritis. From home alone, has cane. PT-no f/u, home rw,OT-HH. [facility] rep-Joe Durham informed to provide home rw to rm prior d/c. Provided w/private duty care list(out of pocket resource) HHPT not covered by insurance-will be an out of pocket cost. Will await if HHRN(covered by insurance) for meds or disease mgmnt,f/c care needed-can arrange.                   Action/Plan:d/c plan home w/dme.   Expected Discharge Date:                  Expected Discharge Plan:     In-House Referral:     Discharge planning Services  CM Consult  Post Acute Care Choice:    Choice offered to:     DME Arranged:  [facility] DME Agency:     HH Arranged:    HH Agency:     Status of Service:  In process, will continue to follow  If discussed at Long Length of Stay Meetings, dates discussed:    Additional Comments:  Lanier Clam, RN 06/13/2017, 3:35 PM

## 2017-06-13 NOTE — Progress Notes (Signed)
Initial Nutrition Assessment  DOCUMENTATION CODES:   Non-severe (moderate) malnutrition in context of chronic illness, Underweight  INTERVENTION:   Continue Ensure Enlive po BID, each supplement provides 350 kcal and 20 grams of protein RD will continue to monitor  NUTRITION DIAGNOSIS:   Malnutrition(moderate) related to chronic illness (ETOH use) as evidenced by moderate depletion of body fat, moderate depletions of muscle mass.  GOAL:   Patient will meet greater than or equal to 90% of their needs  MONITOR:   PO intake, Supplement acceptance, Labs, Weight trends, I & O's  REASON FOR ASSESSMENT:   Malnutrition Screening Tool    ASSESSMENT:   71 y.o. male with medical history significant for hypertension, polysubstance abuse, and bladder stones, now presented to the emergency department for evaluation of gross hematuria and dysuria. Patient reports that he's been experiencing suprapubic tenderness, hematuria, and dysuria intermittently for the past 3 months or so. He also reports some weight loss, subjective fevers, and night sweats over the same interval. Symptoms returned several days ago and had been becoming more severe over the past couple days. He denies chest pain or palpitations and denies any significant dyspnea or cough. He does not take blood thinners. He reports some mild intermittent flank pain bilaterally. Denies recent antibiotics. He is a current smoker.  Patient in room with no family at bedside. Pt reports good appetite PTA but states he hasn't eaten but one meal since admission 8/7. Pt states she had pancakes and bacon this morning for breakfast. Pt states he usually eats 2 meals a day at home, does not drink protein supplements and typically drinks a lot of soda. Pt with reports of daily ETOH consumption.  Per chart review, pt has lost 8 lb since 1/10 (6% wt loss x 7 months, insignificant for time frame). Nutrition-Focused physical exam completed. Findings are  moderate fat depletion, moderate muscle depletion, and no edema.    Labs reviewed. Medications: Folic acid tablet daily, Multivitamin with minerals daily, Thiamine tablet daily, K-DUR tablet once  Diet Order:  Diet regular Room service appropriate? Yes; Fluid consistency: Thin  Skin:  Reviewed, no issues  Last BM:  8/6  Height:   Ht Readings from Last 1 Encounters:  06/12/17 5\' 8"  (1.727 m)    Weight:   Wt Readings from Last 1 Encounters:  06/12/17 117 lb 1 oz (53.1 kg)    Ideal Body Weight:  70 kg  BMI:  Body mass index is 17.8 kg/m.  Estimated Nutritional Needs:   Kcal:  1600-1800  Protein:  80-90g  Fluid:  1.8L/day  EDUCATION NEEDS:   No education needs identified at this time  Tilda Franco, MS, RD, LDN Pager: [phone] After Hours Pager: [phone]

## 2017-06-13 NOTE — Evaluation (Signed)
Physical Therapy Evaluation Patient Details Name: Joe Durham MRN: [id number] DOB: Jul 30, 1946 Today's Date: 06/13/2017   History of Present Illness  pt was admitted with acute pyelonephritis. Pt has uti and bladder calculi.  H/O COPD, polysubstance abuse and HTN  Clinical Impression  The  Patient reports limited resources for assistance at DC. He expresses concern for learning to manage catheter if it is to remain in place at DC. The patient lives on second level apartment. Pt admitted with above diagnosis. Pt currently with functional limitations due to the deficits listed below (see PT Problem List).  Pt will benefit from skilled PT to increase their independence and safety with mobility to allow discharge to the venue listed below.        Follow Up Recommendations No PT follow up    Equipment Recommendations  Rolling walker with 5" wheels    Recommendations for Other Services       Precautions / Restrictions Precautions Precautions: Fall Restrictions Weight Bearing Restrictions: No      Mobility  Bed Mobility Overal bed mobility: Modified Independent             General bed mobility comments: extra time  Transfers Overall transfer level: Needs assistance Equipment used: Rolling walker (2 wheeled);Straight cane Transfers: Sit to/from Stand Sit to Stand: Min guard         General transfer comment: close guard for safety  Ambulation/Gait Ambulation/Gait assistance: Min guard;Supervision   Assistive device: Rolling walker (2 wheeled);Straight cane Gait Pattern/deviations: Step-through pattern     General Gait Details: the patient ambulated with RW x 100' and supervision and min guard and cane x 100'. Gait  with decreased balance with cane.  Stairs            Wheelchair Mobility    Modified Rankin (Stroke Patients Only)       Balance Overall balance assessment: Needs assistance   Sitting balance-Leahy Scale: Normal     Standing balance  support: During functional activity;Single extremity supported Standing balance-Leahy Scale: Poor                               Pertinent Vitals/Pain Pain Assessment: Faces Faces Pain Scale: Hurts little more Pain Location: catheter with movement Pain Descriptors / Indicators: Sore;Spasm Pain Intervention(s): Monitored during session;Premedicated before session    Home Living Family/patient expects to be discharged to:: Private residence Living Arrangements: Alone Available Help at Discharge: Family Type of Home: Apartment Home Access: Stairs to enter Entrance Stairs-Rails: Doctor, general practice of Steps: 17 Home Layout: One level Home Equipment: Cane - single point      Prior Function Level of Independence: Needs assistance         Comments: niece shops for pt.  He sometimes hires "lady on the corner":for cleaning     Hand Dominance   Dominant Hand: Right    Extremity/Trunk Assessment   Upper Extremity Assessment Upper Extremity Assessment: Defer to OT evaluation    Lower Extremity Assessment Lower Extremity Assessment: Generalized weakness    Cervical / Trunk Assessment Cervical / Trunk Assessment: Normal  Communication   Communication: No difficulties  Cognition Arousal/Alertness: Awake/alert Behavior During Therapy: WFL for tasks assessed/performed Overall Cognitive Status: Within Functional Limits for tasks assessed  General Comments      Exercises     Assessment/Plan    PT Assessment Patient needs continued PT services  PT Problem List Decreased activity tolerance;Decreased mobility;Decreased balance;Pain       PT Treatment Interventions DME instruction;Stair training;Gait training;Functional mobility training;Therapeutic activities;Patient/family education    PT Goals (Current goals can be found in the Care Plan section)  Acute Rehab PT Goals Patient Stated  Goal: none stated PT Goal Formulation: With patient Time For Goal Achievement: 06/27/17 Potential to Achieve Goals: Good    Frequency Min 3X/week   Barriers to discharge Decreased caregiver support;Inaccessible home environment      Co-evaluation               AM-PAC PT "6 Clicks" Daily Activity  Outcome Measure Difficulty turning over in bed (including adjusting bedclothes, sheets and blankets)?: None Difficulty moving from lying on back to sitting on the side of the bed? : None Difficulty sitting down on and standing up from a chair with arms (e.g., wheelchair, bedside commode, etc,.)?: Total Help needed moving to and from a bed to chair (including a wheelchair)?: A Little Help needed walking in hospital room?: A Little Help needed climbing 3-5 steps with a railing? : A Lot 6 Click Score: 17    End of Session Equipment Utilized During Treatment: Gait belt Activity Tolerance: Patient tolerated treatment well Patient left: in bed;with call bell/phone within reach;with bed alarm set Nurse Communication: Mobility status PT Visit Diagnosis: Difficulty in walking, not elsewhere classified (R26.2);Pain    Time: 1610-9604 PT Time Calculation (min) (ACUTE ONLY): 20 min   Charges:   PT Evaluation $PT Eval Low Complexity: 1 Low     PT G Codes:   PT G-Codes **NOT FOR INPATIENT CLASS** Functional Assessment Tool Used: Clinical judgement Functional Limitation: Mobility: Walking and moving around Mobility: Walking and Moving Around Current Status (V4098): At least 1 percent but less than 20 percent impaired, limited or restricted Mobility: Walking and Moving Around Goal Status [phone]): 0 percent impaired, limited or restricted    [facility] PT [phone]   Rada Hay 06/13/2017, 1:59 PM

## 2017-06-13 NOTE — Progress Notes (Signed)
OT Cancellation Note  Patient Details Name: Romance Arquette MRN: [id number] DOB: [birth year]   Cancelled Treatment:    Reason Eval/Treat Not Completed: Pain limiting ability to participate.  RN is giving pt pain medication.  Will check back.  Nolen Lindamood 06/13/2017, 8:23 AM  Marica Otter, OTR/L [phone] 06/13/2017

## 2017-06-13 NOTE — Progress Notes (Signed)
Subjective: Patient reports no pain , f/c  Objective: Vital signs in last 24 hours: Temp:  [97.7 F (36.5 C)-98 F (36.7 C)] 97.9 F (36.6 C) (08/09 1337) Pulse Rate:  [72-86] 72 (08/09 1337) Resp:  [15-18] 18 (08/09 1337) BP: (126-144)/(60-72) 133/67 (08/09 1337) SpO2:  [98 %-100 %] 100 % (08/09 1337)  Intake/Output from previous day: 08/08 0701 - 08/09 0700 In: 1130 [P.O.:1080; IV Piggyback:50] Out: 900 [Urine:900] Intake/Output this shift: No intake/output data recorded.  Physical Exam:  Constitutional: Vital signs reviewed. WD WN in NAD   Eyes: PERRL, No scleral icterus.   Cardiovascular: normal rate Pulmonary/Chest: Normal effort   Urine bloody but clearing--not unexpected after bladder decompression  Lab Results:  Recent Labs  06/11/17 2016 06/12/17 0348 06/13/17 0343  HGB 13.9 12.9* 11.6*  HCT 39.9 37.4* 34.7*   BMET  Recent Labs  06/12/17 0348 06/13/17 0343  NA 141 136  K 4.0 3.5  CL 106 104  CO2 27 24  GLUCOSE 112* 102*  BUN 21* 16  CREATININE 1.31* 0.97  CALCIUM 9.5 9.0   No results for input(s): LABPT, INR in the last 72 hours. No results for input(s): LABURIN in the last 72 hours. Results for orders placed or performed during the hospital encounter of 06/11/17  Urine culture     Status: Abnormal   Collection Time: 06/11/17  8:26 PM  Result Value Ref Range Status   Specimen Description URINE, CLEAN CATCH  Final   Special Requests NONE  Final   Culture MULTIPLE SPECIES PRESENT, SUGGEST RECOLLECTION (A)  Final   Report Status 06/13/2017 FINAL  Final    Studies/Results: Ct Abdomen Pelvis W Contrast  Result Date: 06/12/2017 CLINICAL DATA:  Acute onset of hematuria and penile pain, worse with urination. Chronic lower abdominal pain. Weight loss, fever and diaphoresis. Initial encounter. EXAM: CT ABDOMEN AND PELVIS WITH CONTRAST TECHNIQUE: Multidetector CT imaging of the abdomen and pelvis was performed using the standard protocol following  bolus administration of intravenous contrast. CONTRAST:  60mL ISOVUE-300 IOPAMIDOL (ISOVUE-300) INJECTION 61% COMPARISON:  CT of the abdomen and pelvis performed 03/20/2014 FINDINGS: Lower chest: The visualized lung bases are grossly clear. The visualized portions of the mediastinum are unremarkable. Hepatobiliary: The liver is unremarkable in appearance. The gallbladder is unremarkable in appearance. The common bile duct remains normal in caliber. Pancreas: The pancreas is within normal limits. Spleen: The spleen is unremarkable in appearance. Adrenals/Urinary Tract: The adrenal glands are unremarkable in appearance. There is minimal left-sided hydronephrosis, without definite evidence of distal obstructing stone. This may reflect chronic bladder inflammation. The right kidney is unremarkable in appearance. No renal or ureteral stones are identified. No perinephric stranding is seen. Stomach/Bowel: The stomach is unremarkable in appearance. The small bowel is within normal limits. The appendix is normal in caliber, without evidence of appendicitis. The colon is unremarkable in appearance. Vascular/Lymphatic: Scattered calcification is seen along the abdominal aorta and its branches. The abdominal aorta is otherwise grossly unremarkable. The inferior vena cava is grossly unremarkable. No retroperitoneal lymphadenopathy is seen. No pelvic sidewall lymphadenopathy is identified. Reproductive: Numerous large stones are seen dependently within the bladder. 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 Mild surrounding soft tissue inflammation is noted at the dome of the bladder. The prostate remains borderline normal in size. Other: No additional soft tissue abnormalities are seen. Musculoskeletal: No acute osseous abnormalities are identified. The patient's right hip arthroplasty is grossly unremarkable in appearance, though  incompletely  imaged. The visualized musculature is unremarkable in appearance. IMPRESSION: 1. 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 Underlying mass cannot be entirely excluded. The degree of wall thickening is significantly worsened from 2015. Numerous large stones seen dependently within the bladder. Mild surrounding soft tissue inflammation at the dome of the bladder. 2. Minimal left-sided hydronephrosis, without definite evidence of distal obstructing stone. This may reflect the chronic bladder inflammation. 3. Scattered aortic atherosclerosis. Electronically Signed   By: Roanna Raider M.D.   On: 06/12/2017 00:19   Urine C&S--multiple species  Assessment/Plan:   Doing well w/ catheter--expect urine to eventually clear. I agree w/ Dr Janee Morn for 2 weeks of abx--ceph or sulfa fine  Will need to go home w/ catheter, followup appt scheduled   LOS: 0 days   Marcine Matar M 06/13/2017, 3:55 PM

## 2017-06-13 NOTE — Care Management Obs Status (Signed)
MEDICARE OBSERVATION STATUS NOTIFICATION   Patient Details  Name: Joe Durham MRN: [id number] Date of Birth: 1946-10-06   Medicare Observation Status Notification Given:  Yes    MahabirOlegario Messier, RN 06/13/2017, 3:28 PM

## 2017-06-13 NOTE — Evaluation (Signed)
Occupational Therapy Evaluation Patient Details Name: Joe Durham MRN: [id number] DOB: 1946/05/09 Today's Date: 06/13/2017    History of Present Illness pt was admitted with acute pyelonephritis. Pt has uti and bladder calculi.  H/O COPD, polysubstance abuse and HTN   Clinical Impression   This 71 year old man was admitted for the above. At baseline, he is mod I for basic adls, and he lives alone. Will follow in acute with mod I level goals. Will further assess need for tub DME also   Follow Up Recommendations  Home health OT    Equipment Recommendations   (tub dme to be further assessed)    Recommendations for Other Services       Precautions / Restrictions Precautions Precautions: Fall Restrictions Weight Bearing Restrictions: No      Mobility Bed Mobility Overal bed mobility: Modified Independent             General bed mobility comments: extra time  Transfers Overall transfer level: Needs assistance Equipment used: Rolling walker (2 wheeled);Straight cane Transfers: Sit to/from Stand Sit to Stand: Min guard         General transfer comment: close guard for safety    Balance                                           ADL either performed or assessed with clinical judgement   ADL Overall ADL's : Needs assistance/impaired     Grooming: Supervision/safety;Standing   Upper Body Bathing: Set up;Sitting   Lower Body Bathing: Min guard;Sit to/from stand   Upper Body Dressing : Minimal assistance;Sitting (iv)   Lower Body Dressing: Min guard;Sit to/from stand   Toilet Transfer: Min guard;Ambulation;BSC;RW             General ADL Comments: pt a little unsteady but no LOB.  Better with RW (vs cane).  Pt with limited assistance at home     Vision         Perception     Praxis      Pertinent Vitals/Pain Pain Assessment: Faces Faces Pain Scale: Hurts little more Pain Location: catheter with movement Pain Descriptors /  Indicators: Aching Pain Intervention(s): Limited activity within patient's tolerance;Monitored during session;Premedicated before session;Repositioned     Hand Dominance     Extremity/Trunk Assessment Upper Extremity Assessment Upper Extremity Assessment: Overall WFL for tasks assessed           Communication Communication Communication: No difficulties   Cognition Arousal/Alertness: Awake/alert Behavior During Therapy: WFL for tasks assessed/performed Overall Cognitive Status: Within Functional Limits for tasks assessed                                     General Comments       Exercises     Shoulder Instructions      Home Living Family/patient expects to be discharged to:: Private residence Living Arrangements: Alone Available Help at Discharge: Family Type of Home: Apartment             Bathroom Shower/Tub: Chief Strategy Officer: Handicapped height     Home Equipment: Cane - single point          Prior Functioning/Environment Level of Independence: Needs assistance        Comments: niece shops for pt.  He sometimes hires "lady on the corner":for cleaning        OT Problem List: Decreased strength;Decreased activity tolerance;Impaired balance (sitting and/or standing);Pain;Decreased knowledge of use of DME or AE      OT Treatment/Interventions: Self-care/ADL training;DME and/or AE instruction;Balance training;Patient/family education    OT Goals(Current goals can be found in the care plan section) Acute Rehab OT Goals Patient Stated Goal: none stated OT Goal Formulation: With patient Time For Goal Achievement: 06/20/17 Potential to Achieve Goals: Good ADL Goals Pt Will Transfer to Toilet: with modified independence;ambulating;bedside commode Pt Will Perform Toileting - Clothing Manipulation and hygiene: with modified independence;sit to/from stand Additional ADL Goal #1: pt will gather clothes and complete ADL at  mod I level  OT Frequency: Min 2X/week   Barriers to D/C:            Co-evaluation              AM-PAC PT "6 Clicks" Daily Activity     Outcome Measure Help from another person eating meals?: None Help from another person taking care of personal grooming?: A Little Help from another person toileting, which includes using toliet, bedpan, or urinal?: A Little Help from another person bathing (including washing, rinsing, drying)?: A Little Help from another person to put on and taking off regular upper body clothing?: A Little Help from another person to put on and taking off regular lower body clothing?: A Little 6 Click Score: 19   End of Session    Activity Tolerance: Patient tolerated treatment well Patient left: in bed;with call bell/phone within reach  OT Visit Diagnosis: Unsteadiness on feet (R26.81)                Time: 6283-1517 OT Time Calculation (min): 20 min Charges:  OT General Charges $OT Visit: 1 Procedure OT Evaluation $OT Eval Low Complexity: 1 Procedure G-Codes:     [place], OTR/L [phone] 06/13/2017  Sharene Krikorian 06/13/2017, 11:39 AM

## 2017-06-13 NOTE — Progress Notes (Signed)
PT Cancellation Note  Patient Details Name: Joe Durham MRN: [id number] DOB: 16-Aug-1946   Cancelled Treatment:    Reason Eval/Treat Not Completed: Patient not medically ready (was  just getting pain meds. will check back later. )   Sharen Heck PT [phone]  06/13/2017, 11:01 AM

## 2017-06-13 NOTE — Progress Notes (Signed)
PROGRESS NOTE    Joe Durham  XBJ:[id number] DOB: [birth year] DOA: 06/11/2017 PCP: Howard Pouch, MD   Brief Narrative:  Patient is a 71 year old gentleman history of hypertension, bladder stones who presented to the ED with gross hematuria dysuria CVA tenderness found to have acute pyelonephritis, complicated UTI with multiple stones placed empirically on IV antibiotics is seen in consultation by urology.   Assessment & Plan:   Principal Problem:   Acute pyelonephritis Active Problems:   Complicated UTI (urinary tract infection)   Gross hematuria   Uncontrolled hypertension   Bladder stones   Renal insufficiency   Uncomplicated alcohol dependence (HCC)   Acute kidney injury (HCC)  #1 acute pyelonephritis Patient presenting with gross hematuria, dysuria, left greater right CVA tenderness and suprapubic tenderness with urinalysis consistent with a UTI. CT abdomen and pelvis which was done showed large stones in the bladder and soft tissue inflammation about the dome of the bladder with minimal left-sided hydronephrosis. Urine cultures were obtained which showed multiple species however patient with clinical acute pyelonephritis. Patient was placed empirically on IV Rocephin. Patient still with left CVA tenderness. Continue hydration, pain management, empiric IV antibiotics. Will likely transition to oral antibiotics in the next one to 2 days.  #2 complicated urinary tract infection/nephrolithiasis Patient with a complicated urinary tract infection with CT abdomen and pelvis showing diffuse irregular bladder wall thickening likely reflecting a neurogenic bladder, underlying mass cannot be entirely excluded. Wall thickening worsened since 2015. Numerous large stones is seen dependently within the bladder. Surrounding soft tissue inflammation at the dome of the bladder. Urine cultures showing multiple species. Patient was placed empirically on IV Rocephin. Patient seen in consultation by  urology who recommended a Foley catheter placement, continued IV antibiotics and treat for total of approximately 10-14 days. Patient will need outpatient follow-up with urology for further evaluation with cystoscopy.  #3 acute kidney injury Likely secondary to prerenal azotemia in the setting of UTI. Renal function improved with hydration. Follow.  #4 bladder dysfunction Per urology patient likely with long-standing bladder outlet obstruction. Patient noted to have a thick-walled bladder with CT scan. Foley cath has been placed. Patient will be discharged with a Foley catheter with outpatient follow-up with urology.  #5 multiple bladder calculi Patient has been seen in consultation by urology who recommended outpatient follow-up with cystoscopy and further management.  #6 gross hematuria Questionable etiology. Could likely be traumatic after Foley insertion and thick-walled bladder versus secondary to malignancy versus secondary to multiple stones noted. Bladder irrigation. Continue Foley catheter. Urology following.  #7 history of alcohol dependence Stable. No signs of withdrawal. Monitor closely.     DVT prophylaxis: SCDs Code Status: Full Family Communication: Updated patient. No family present. Disposition Plan: Home once hematuria has improved, improvement with CVA tenderness    Consultants:   Urology: Dr. Retta Diones 06/12/2017  Procedures:   CT abdomen and pelvis 06/11/2017  Foley catheter placement 06/12/2017  Antimicrobials:   IV Rocephin 06/12/2017   Subjective: Foley catheter with gross hematuria. Patient complaining of left CVA tenderness with no significant improvement since admission. No nausea or emesis.  Objective: Vitals:   06/12/17 1520 06/12/17 2254 06/13/17 0603 06/13/17 1337  BP: (!) 142/79 (!) 144/72 126/60 133/67  Pulse: 98 74 86 72  Resp: 18 15 16 18   Temp: 97.7 F (36.5 C) 98 F (36.7 C) 97.7 F (36.5 C) 97.9 F (36.6 C)  TempSrc: Oral  Oral Oral Oral  SpO2: 98% 100% 98% 100%  Weight:  Height:        Intake/Output Summary (Last 24 hours) at 06/13/17 1429 Last data filed at 06/13/17 0400  Gross per 24 hour  Intake              890 ml  Output              820 ml  Net               70 ml   Filed Weights   06/12/17 0329  Weight: 53.1 kg (117 lb 1 oz)    Examination:  General exam: Appears calm and comfortable  Respiratory system: Clear to auscultation. Respiratory effort normal. Cardiovascular system: S1 & S2 heard, RRR. No JVD, murmurs, rubs, gallops or clicks. No pedal edema. Gastrointestinal system: Abdomen is nondistended, soft and nontender. Positive left CVA with exquisite tenderness to palpation. No organomegaly or masses felt. Normal bowel sounds heard. Genitourinary: Foley catheter in place with gross hematuria Central nervous system: Alert and oriented. No focal neurological deficits. Extremities: Symmetric 5 x 5 power. Skin: No rashes, lesions or ulcers Psychiatry: Judgement and insight appear normal. Mood & affect appropriate.     Data Reviewed: I have personally reviewed following labs and imaging studies  CBC:  Recent Labs Lab 06/11/17 2016 06/12/17 0348 06/13/17 0343  WBC 13.8* 10.4 10.8*  NEUTROABS  --  7.1  --   HGB 13.9 12.9* 11.6*  HCT 39.9 37.4* 34.7*  MCV 104.5* 103.3* 104.8*  PLT 337 310 305   Basic Metabolic Panel:  Recent Labs Lab 06/11/17 2016 06/12/17 0348 06/13/17 0343  NA 139 141 136  K 4.5 4.0 3.5  CL 103 106 104  CO2 26 27 24   GLUCOSE 95 112* 102*  BUN 21* 21* 16  CREATININE 1.39* 1.31* 0.97  CALCIUM 10.1 9.5 9.0   GFR: Estimated Creatinine Clearance: 54 mL/min (by C-G formula based on SCr of 0.97 mg/dL). Liver Function Tests:  Recent Labs Lab 06/11/17 2016  AST 15  ALT 10*  ALKPHOS 57  BILITOT 0.7  PROT 8.5*  ALBUMIN 4.0    Recent Labs Lab 06/11/17 2016  LIPASE 28   No results for input(s): AMMONIA in the last 168 hours. Coagulation  Profile: No results for input(s): INR, PROTIME in the last 168 hours. Cardiac Enzymes: No results for input(s): CKTOTAL, CKMB, CKMBINDEX, TROPONINI in the last 168 hours. BNP (last 3 results) No results for input(s): PROBNP in the last 8760 hours. HbA1C: No results for input(s): HGBA1C in the last 72 hours. CBG: No results for input(s): GLUCAP in the last 168 hours. Lipid Profile: No results for input(s): CHOL, HDL, LDLCALC, TRIG, CHOLHDL, LDLDIRECT in the last 72 hours. Thyroid Function Tests: No results for input(s): TSH, T4TOTAL, FREET4, T3FREE, THYROIDAB in the last 72 hours. Anemia Panel: No results for input(s): VITAMINB12, FOLATE, FERRITIN, TIBC, IRON, RETICCTPCT in the last 72 hours. Sepsis Labs: No results for input(s): PROCALCITON, LATICACIDVEN in the last 168 hours.  Recent Results (from the past 240 hour(s))  Urine culture     Status: Abnormal   Collection Time: 06/11/17  8:26 PM  Result Value Ref Range Status   Specimen Description URINE, CLEAN CATCH  Final   Special Requests NONE  Final   Culture MULTIPLE SPECIES PRESENT, SUGGEST RECOLLECTION (A)  Final   Report Status 06/13/2017 FINAL  Final         Radiology Studies: Ct Abdomen Pelvis W Contrast  Result Date: 06/12/2017 CLINICAL DATA:  Acute onset of  hematuria and penile pain, worse with urination. Chronic lower abdominal pain. Weight loss, fever and diaphoresis. Initial encounter. EXAM: CT ABDOMEN AND PELVIS WITH CONTRAST TECHNIQUE: Multidetector CT imaging of the abdomen and pelvis was performed using the standard protocol following bolus administration of intravenous contrast. CONTRAST:  80mL ISOVUE-300 IOPAMIDOL (ISOVUE-300) INJECTION 61% COMPARISON:  CT of the abdomen and pelvis performed 03/20/2014 FINDINGS: Lower chest: The visualized lung bases are grossly clear. The visualized portions of the mediastinum are unremarkable. Hepatobiliary: The liver is unremarkable in appearance. The gallbladder is  unremarkable in appearance. The common bile duct remains normal in caliber. Pancreas: The pancreas is within normal limits. Spleen: The spleen is unremarkable in appearance. Adrenals/Urinary Tract: The adrenal glands are unremarkable in appearance. There is minimal left-sided hydronephrosis, without definite evidence of distal obstructing stone. This may reflect chronic bladder inflammation. The right kidney is unremarkable in appearance. No renal or ureteral stones are identified. No perinephric stranding is seen. Stomach/Bowel: The stomach is unremarkable in appearance. The small bowel is within normal limits. The appendix is normal in caliber, without evidence of appendicitis. The colon is unremarkable in appearance. Vascular/Lymphatic: Scattered calcification is seen along the abdominal aorta and its branches. The abdominal aorta is otherwise grossly unremarkable. The inferior vena cava is grossly unremarkable. No retroperitoneal lymphadenopathy is seen. No pelvic sidewall lymphadenopathy is identified. Reproductive: Numerous large stones are seen dependently within the bladder. 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 Mild surrounding soft tissue inflammation is noted at the dome of the bladder. The prostate remains borderline normal in size. Other: No additional soft tissue abnormalities are seen. Musculoskeletal: No acute osseous abnormalities are identified. The patient's right hip arthroplasty is grossly unremarkable in appearance, though incompletely imaged. The visualized musculature is unremarkable in appearance. IMPRESSION: 1. 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 Underlying mass cannot be entirely excluded. The degree of wall thickening is significantly worsened from 2015. Numerous large stones seen dependently within the bladder. Mild  surrounding soft tissue inflammation at the dome of the bladder. 2. Minimal left-sided hydronephrosis, without definite evidence of distal obstructing stone. This may reflect the chronic bladder inflammation. 3. Scattered aortic atherosclerosis. Electronically Signed   By: Roanna Raider M.D.   On: 06/12/2017 00:19        Scheduled Meds: . amLODipine  10 mg Oral Daily  . atorvastatin  40 mg Oral q1800  . cilostazol  50 mg Oral BID  . feeding supplement (ENSURE ENLIVE)  237 mL Oral BID BM  . folic acid  1 mg Oral Daily  . LORazepam  0-4 mg Oral Q6H   Followed by  . [START ON 06/14/2017] LORazepam  0-4 mg Oral Q12H  . multivitamin with minerals  1 tablet Oral Daily  . pneumococcal 23 valent vaccine  0.5 mL Intramuscular Tomorrow-1000  . thiamine  100 mg Oral Daily   Or  . thiamine  100 mg Intravenous Daily   Continuous Infusions: . cefTRIAXone (ROCEPHIN)  IV Stopped (06/12/17 2230)     LOS: 0 days    Time spent: 35 minutes    THOMPSON,DANIEL, MD [facility] Pager [phone]  If 7PM-7AM, please contact night-coverage [email or web address] Password [email or web address] 06/13/2017, 2:29 PM

## 2017-06-14 LAB — CBC WITH DIFFERENTIAL/PLATELET
BASOS ABS: 0 10*3/uL (ref 0.0–0.1)
BASOS PCT: 1 %
Eosinophils Absolute: 0.3 10*3/uL (ref 0.0–0.7)
Eosinophils Relative: 4 %
HEMATOCRIT: 35.2 % — AB (ref 39.0–52.0)
Hemoglobin: 11.8 g/dL — ABNORMAL LOW (ref 13.0–17.0)
Lymphocytes Relative: 21 %
Lymphs Abs: 1.7 10*3/uL (ref 0.7–4.0)
MCH: 34.9 pg — ABNORMAL HIGH (ref 26.0–34.0)
MCHC: 33.5 g/dL (ref 30.0–36.0)
MCV: 104.1 fL — ABNORMAL HIGH (ref 78.0–100.0)
MONO ABS: 1 10*3/uL (ref 0.1–1.0)
Monocytes Relative: 12 %
NEUTROS ABS: 5.2 10*3/uL (ref 1.7–7.7)
Neutrophils Relative %: 62 %
PLATELETS: 342 10*3/uL (ref 150–400)
RBC: 3.38 MIL/uL — ABNORMAL LOW (ref 4.22–5.81)
RDW: 12.2 % (ref 11.5–15.5)
WBC: 8.3 10*3/uL (ref 4.0–10.5)

## 2017-06-14 LAB — BASIC METABOLIC PANEL
ANION GAP: 6 (ref 5–15)
BUN: 9 mg/dL (ref 6–20)
CALCIUM: 9.2 mg/dL (ref 8.9–10.3)
CO2: 27 mmol/L (ref 22–32)
Chloride: 103 mmol/L (ref 101–111)
Creatinine, Ser: 0.99 mg/dL (ref 0.61–1.24)
Glucose, Bld: 90 mg/dL (ref 65–99)
Potassium: 4.4 mmol/L (ref 3.5–5.1)
SODIUM: 136 mmol/L (ref 135–145)

## 2017-06-14 MED ORDER — CIPROFLOXACIN HCL 500 MG PO TABS
500.0000 mg | ORAL_TABLET | Freq: Two times a day (BID) | ORAL | Status: DC
  Administered 2017-06-14 – 2017-06-17 (×6): 500 mg via ORAL
  Filled 2017-06-14 (×7): qty 1

## 2017-06-14 NOTE — Progress Notes (Signed)
Physical Therapy Treatment Patient Details Name: Joe Durham MRN: [id number] DOB: 12/19/1945 Today's Date: 06/14/2017    History of Present Illness pt was admitted with acute pyelonephritis. Pt has uti and bladder calculi.  H/O COPD, polysubstance abuse and HTN    PT Comments    The patient's mobility and safety is improved. Requires reminders to not forget The foley catheter. Continue PT.   Follow Up Recommendations  No PT follow up     Equipment Recommendations  Rolling walker with 5" wheels    Recommendations for Other Services       Precautions / Restrictions Precautions Precautions: Fall    Mobility  Bed Mobility Overal bed mobility: Independent                Transfers Overall transfer level: Needs assistance Equipment used: Rolling walker (2 wheeled) Transfers: Sit to/from Stand Sit to Stand: Supervision            Ambulation/Gait Ambulation/Gait assistance: Min guard Ambulation Distance (Feet): 400 Feet Assistive device: Rolling walker (2 wheeled) Gait Pattern/deviations: Step-through pattern     General Gait Details: patient ambulated well with Rw, no balance losses noted. Gait is improved   Information systems manager Rankin (Stroke Patients Only)       Balance     Sitting balance-Leahy Scale: Normal     Standing balance support: During functional activity;Bilateral upper extremity supported Standing balance-Leahy Scale: Good                              Cognition Arousal/Alertness: Awake/alert                                            Exercises      General Comments        Pertinent Vitals/Pain Faces Pain Scale: Hurts a little bit Pain Location: catheter with movement Pain Descriptors / Indicators: Sore    Home Living                      Prior Function            PT Goals (current goals can now be found in the care plan section)  Progress towards PT goals: Progressing toward goals    Frequency    Min 3X/week      PT Plan Current plan remains appropriate    Co-evaluation              AM-PAC PT "6 Clicks" Daily Activity  Outcome Measure  Difficulty turning over in bed (including adjusting bedclothes, sheets and blankets)?: None Difficulty moving from lying on back to sitting on the side of the bed? : None Difficulty sitting down on and standing up from a chair with arms (e.g., wheelchair, bedside commode, etc,.)?: Total Help needed moving to and from a bed to chair (including a wheelchair)?: Total Help needed walking in hospital room?: Total Help needed climbing 3-5 steps with a railing? : Total 6 Click Score: 12    End of Session   Activity Tolerance: Patient tolerated treatment well Patient left: in bed;with call bell/phone within reach;with bed alarm set Nurse Communication: Mobility status PT Visit Diagnosis: Difficulty in walking, not elsewhere classified (R26.2);Pain     Time:  1610-9604 PT Time Calculation (min) (ACUTE ONLY): 17 min  Charges:  $Gait Training: 8-22 mins                    G CodesBlanchard Kelch PT [phone]    Rada Hay 06/14/2017, 4:21 PM

## 2017-06-14 NOTE — Progress Notes (Signed)
PROGRESS NOTE    Joe Durham  TKP:[id number] DOB: [birth year] DOA: 06/11/2017 PCP: Howard Pouch, MD   Brief Narrative:  Patient is a 71 year old gentleman history of hypertension, bladder stones who presented to the ED with gross hematuria dysuria CVA tenderness found to have acute pyelonephritis, complicated UTI with multiple stones placed empirically on IV antibiotics is seen in consultation by urology.   Assessment & Plan:   Principal Problem:   Acute pyelonephritis Active Problems:   Complicated UTI (urinary tract infection)   Gross hematuria   Uncontrolled hypertension   Bladder stones   Renal insufficiency   Uncomplicated alcohol dependence (HCC)   Acute kidney injury (HCC)   Acute cystitis with hematuria   UTI (urinary tract infection)  #1 acute pyelonephritis Patient presenting with gross hematuria, dysuria, left greater right CVA tenderness and suprapubic tenderness with urinalysis consistent with a UTI. CT abdomen and pelvis which was done showed large stones in the bladder and soft tissue inflammation about the dome of the bladder with minimal left-sided hydronephrosis. Urine cultures were obtained which showed multiple species however patient with clinical acute pyelonephritis. Patient was placed empirically on IV Rocephin. Patient still with left CVA tenderness. Continue hydration, pain management. Transition from IV antibiotics to oral cipro to complete course of antibiotics.  #2 complicated urinary tract infection/nephrolithiasis Patient with a complicated urinary tract infection with CT abdomen and pelvis showing diffuse irregular bladder wall thickening likely reflecting a neurogenic bladder, underlying mass cannot be entirely excluded. Wall thickening worsened since 2015. Numerous large stones is seen dependently within the bladder. Surrounding soft tissue inflammation at the dome of the bladder. Urine cultures showing multiple species. Patient was placed empirically  on IV Rocephin. Patient seen in consultation by urology who recommended a Foley catheter placement, continued IV antibiotics and treat for total of approximately 10-14 days. Will transition patient from IV Rocephin to oral ciprofloxacin. Patient will need outpatient follow-up with urology for further evaluation with cystoscopy.  #3 acute kidney injury Likely secondary to prerenal azotemia in the setting of UTI. Renal function improved with hydration. Follow.  #4 bladder dysfunction Per urology patient likely with long-standing bladder outlet obstruction. Patient noted to have a thick-walled bladder with CT scan. Foley cath has been placed. Patient will be discharged with a Foley catheter with outpatient follow-up with urology.  #5 multiple bladder calculi Patient has been seen in consultation by urology who recommended outpatient follow-up with cystoscopy and further management.  #6 gross hematuria Questionable etiology. Could likely be traumatic after Foley insertion and thick-walled bladder which is likely friable versus secondary to malignancy versus secondary to multiple stones noted. Bladder irrigation. Continue Foley catheter. Urology following.  #7 history of alcohol dependence Stable. No signs of withdrawal. Monitor closely.     DVT prophylaxis: SCDs Code Status: Full Family Communication: Updated patient. No family present. Disposition Plan: Home once hematuria has improved, improvement with CVA tenderness hopefully in the next 1-2 days.    Consultants:   Urology: Dr. Retta Diones 06/12/2017  Procedures:   CT abdomen and pelvis 06/11/2017  Foley catheter placement 06/12/2017  Antimicrobials:   IV Rocephin 06/12/2017>>> 06/14/2017  Oral ciprofloxacin 06/14/2017   Subjective: Foley catheter with pink urine. Patient states left CVA tenderness improvement. No nausea or emesis. Tolerating oral intake.  Objective: Vitals:   06/13/17 0603 06/13/17 1337 06/13/17 2040  06/14/17 0442  BP: 126/60 133/67 124/67 (!) 144/67  Pulse: 86 72 (!) 58 85  Resp: 16 18 18 16   Temp: 97.7 F (36.5  C) 97.9 F (36.6 C) 98.6 F (37 C) 98.2 F (36.8 C)  TempSrc: Oral Oral Oral Oral  SpO2: 98% 100% 100% 98%  Weight:      Height:        Intake/Output Summary (Last 24 hours) at 06/14/17 1141 Last data filed at 06/14/17 0950  Gross per 24 hour  Intake              940 ml  Output             1150 ml  Net             -210 ml   Filed Weights   06/12/17 0329  Weight: 53.1 kg (117 lb 1 oz)    Examination:  General exam: Appears calm and comfortable  Respiratory system: Clear to auscultation. Respiratory effort normal. Cardiovascular system: S1 & S2 heard, RRR. No JVD, murmurs, rubs, gallops or clicks. No pedal edema. Gastrointestinal system: Abdomen is nondistended, soft and nontender. Positive left CVA with decreasing exquisite tenderness to palpation. No organomegaly or masses felt. Normal bowel sounds heard. Genitourinary: Foley catheter in place with pink urine. Central nervous system: Alert and oriented. No focal neurological deficits. Extremities: Symmetric 5 x 5 power. Skin: No rashes, lesions or ulcers Psychiatry: Judgement and insight appear normal. Mood & affect appropriate.     Data Reviewed: I have personally reviewed following labs and imaging studies  CBC:  Recent Labs Lab 06/11/17 2016 06/12/17 0348 06/13/17 0343 06/14/17 0500  WBC 13.8* 10.4 10.8* 8.3  NEUTROABS  --  7.1  --  5.2  HGB 13.9 12.9* 11.6* 11.8*  HCT 39.9 37.4* 34.7* 35.2*  MCV 104.5* 103.3* 104.8* 104.1*  PLT 337 310 305 342   Basic Metabolic Panel:  Recent Labs Lab 06/11/17 2016 06/12/17 0348 06/13/17 0343 06/14/17 0500  NA 139 141 136 136  K 4.5 4.0 3.5 4.4  CL 103 106 104 103  CO2 26 27 24 27   GLUCOSE 95 112* 102* 90  BUN 21* 21* 16 9  CREATININE 1.39* 1.31* 0.97 0.99  CALCIUM 10.1 9.5 9.0 9.2   GFR: Estimated Creatinine Clearance: 52.9 mL/min (by C-G  formula based on SCr of 0.99 mg/dL). Liver Function Tests:  Recent Labs Lab 06/11/17 2016  AST 15  ALT 10*  ALKPHOS 57  BILITOT 0.7  PROT 8.5*  ALBUMIN 4.0    Recent Labs Lab 06/11/17 2016  LIPASE 28   No results for input(s): AMMONIA in the last 168 hours. Coagulation Profile: No results for input(s): INR, PROTIME in the last 168 hours. Cardiac Enzymes: No results for input(s): CKTOTAL, CKMB, CKMBINDEX, TROPONINI in the last 168 hours. BNP (last 3 results) No results for input(s): PROBNP in the last 8760 hours. HbA1C: No results for input(s): HGBA1C in the last 72 hours. CBG: No results for input(s): GLUCAP in the last 168 hours. Lipid Profile: No results for input(s): CHOL, HDL, LDLCALC, TRIG, CHOLHDL, LDLDIRECT in the last 72 hours. Thyroid Function Tests: No results for input(s): TSH, T4TOTAL, FREET4, T3FREE, THYROIDAB in the last 72 hours. Anemia Panel: No results for input(s): VITAMINB12, FOLATE, FERRITIN, TIBC, IRON, RETICCTPCT in the last 72 hours. Sepsis Labs: No results for input(s): PROCALCITON, LATICACIDVEN in the last 168 hours.  Recent Results (from the past 240 hour(s))  Urine culture     Status: Abnormal   Collection Time: 06/11/17  8:26 PM  Result Value Ref Range Status   Specimen Description URINE, CLEAN CATCH  Final   Special  Requests NONE  Final   Culture MULTIPLE SPECIES PRESENT, SUGGEST RECOLLECTION (A)  Final   Report Status 06/13/2017 FINAL  Final         Radiology Studies: No results found.      Scheduled Meds: . amLODipine  10 mg Oral Daily  . atorvastatin  40 mg Oral q1800  . cilostazol  50 mg Oral BID  . feeding supplement (ENSURE ENLIVE)  237 mL Oral BID BM  . folic acid  1 mg Oral Daily  . LORazepam  0-4 mg Oral Q12H  . multivitamin with minerals  1 tablet Oral Daily  . pneumococcal 23 valent vaccine  0.5 mL Intramuscular Tomorrow-1000  . thiamine  100 mg Oral Daily   Or  . thiamine  100 mg Intravenous Daily    Continuous Infusions: . cefTRIAXone (ROCEPHIN)  IV Stopped (06/14/17 0047)     LOS: 1 day    Time spent: 35 minutes    Gloris Shiroma, MD [facility] Pager [phone]  If 7PM-7AM, please contact night-coverage [email or web address] Password [email or web address] 06/14/2017, 11:41 AM

## 2017-06-14 NOTE — Progress Notes (Signed)
Occupational Therapy Treatment Patient Details Name: Joe Durham MRN: [id number] DOB: 1946/04/08 Today's Date: 06/14/2017    History of present illness pt was admitted with acute pyelonephritis. Pt has uti and bladder calculi.  H/O COPD, polysubstance abuse and HTN   OT comments  Pt slightly unsteady when he first got up then supervision using RW.  He is used to cane vs no AD  Follow Up Recommendations  Home health OT    Equipment Recommendations   (tub to be further assessed)    Recommendations for Other Services      Precautions / Restrictions Precautions Precautions: Fall Restrictions Weight Bearing Restrictions: No       Mobility Bed Mobility Overal bed mobility: Modified Independent             General bed mobility comments: OT watched catheter  Transfers   Equipment used: Standard walker   Sit to Stand: Supervision         General transfer comment: cues for UE placement.  Pt is used to spc or no ad    Balance                                           ADL either performed or assessed with clinical judgement   ADL           Upper Body Bathing: Supervision/ safety;Standing   Lower Body Bathing: Supervison/ safety;Sit to/from stand   Upper Body Dressing : Supervision/safety;Standing       Toilet Transfer: Supervision/safety;Comfort height toilet;Ambulation;RW             General ADL Comments: Pt initiallyl needed min guard to walk to bathroom and supervision out of bathroom.  Completed ADL at sink     Vision       Perception     Praxis      Cognition Arousal/Alertness: Awake/alert Behavior During Therapy: WFL for tasks assessed/performed Overall Cognitive Status: Within Functional Limits for tasks assessed                                          Exercises     Shoulder Instructions       General Comments      Pertinent Vitals/ Pain       Pain Assessment: No/denies pain  Home  Living                                          Prior Functioning/Environment              Frequency  Min 2X/week        Progress Toward Goals  OT Goals(current goals can now be found in the care plan section)  Progress towards OT goals: Progressing toward goals  Acute Rehab OT Goals Time For Goal Achievement: 06/20/17  Plan      Co-evaluation                 AM-PAC PT "6 Clicks" Daily Activity     Outcome Measure   Help from another person eating meals?: None Help from another person taking care of personal grooming?: A Little Help from another person toileting, which includes using toliet, bedpan,  or urinal?: A Little Help from another person bathing (including washing, rinsing, drying)?: A Little Help from another person to put on and taking off regular upper body clothing?: A Little Help from another person to put on and taking off regular lower body clothing?: A Little 6 Click Score: 19    End of Session    OT Visit Diagnosis: Unsteadiness on feet (R26.81)   Activity Tolerance Patient tolerated treatment well   Patient Left in bed;with call bell/phone within reach;with bed alarm set   Nurse Communication          Time: 1191-4782 OT Time Calculation (min): 21 min  Charges: OT General Charges $OT Visit: 1 Procedure OT Treatments $Self Care/Home Management : 8-22 mins  Marica Otter, OTR/L [phone] 06/14/2017   Joe Durham 06/14/2017, 10:13 AM

## 2017-06-15 LAB — CBC WITH DIFFERENTIAL/PLATELET
BASOS ABS: 0 10*3/uL (ref 0.0–0.1)
Basophils Relative: 1 %
EOS PCT: 6 %
Eosinophils Absolute: 0.5 10*3/uL (ref 0.0–0.7)
HCT: 33.4 % — ABNORMAL LOW (ref 39.0–52.0)
HEMOGLOBIN: 11.4 g/dL — AB (ref 13.0–17.0)
LYMPHS ABS: 1.8 10*3/uL (ref 0.7–4.0)
LYMPHS PCT: 21 %
MCH: 35 pg — AB (ref 26.0–34.0)
MCHC: 34.1 g/dL (ref 30.0–36.0)
MCV: 102.5 fL — AB (ref 78.0–100.0)
Monocytes Absolute: 1 10*3/uL (ref 0.1–1.0)
Monocytes Relative: 11 %
NEUTROS PCT: 61 %
Neutro Abs: 5.5 10*3/uL (ref 1.7–7.7)
PLATELETS: 316 10*3/uL (ref 150–400)
RBC: 3.26 MIL/uL — AB (ref 4.22–5.81)
RDW: 11.8 % (ref 11.5–15.5)
WBC: 8.8 10*3/uL (ref 4.0–10.5)

## 2017-06-15 NOTE — Progress Notes (Signed)
PROGRESS NOTE    Joe Durham  WUJ:[id number] DOB: [birth year] DOA: 06/11/2017 PCP: Howard Pouch, MD   Brief Narrative:  Patient is a 71 year old gentleman history of hypertension, bladder stones who presented to the ED with gross hematuria dysuria CVA tenderness found to have acute pyelonephritis, complicated UTI with multiple stones placed empirically on IV antibiotics is seen in consultation by urology.   Assessment & Plan:   Principal Problem:   Acute pyelonephritis Active Problems:   Complicated UTI (urinary tract infection)   Gross hematuria   Uncontrolled hypertension   Bladder stones   Renal insufficiency   Uncomplicated alcohol dependence (HCC)   Acute kidney injury (HCC)   Acute cystitis with hematuria   UTI (urinary tract infection)  #1 acute pyelonephritis Patient presenting with gross hematuria, dysuria, left greater right CVA tenderness and suprapubic tenderness with urinalysis consistent with a UTI. CT abdomen and pelvis which was done showed large stones in the bladder and soft tissue inflammation about the dome of the bladder with minimal left-sided hydronephrosis. Urine cultures were obtained which showed multiple species however patient with clinical acute pyelonephritis. Patient was placed empirically on IV Rocephin. Patient still with left CVA tenderness. Continue hydration, pain management. Transitioned from IV antibiotics to oral cipro to complete course of antibiotics.  #2 complicated urinary tract infection/nephrolithiasis Patient with a complicated urinary tract infection with CT abdomen and pelvis showing diffuse irregular bladder wall thickening likely reflecting a neurogenic bladder, underlying mass cannot be entirely excluded. Wall thickening worsened since 2015. Numerous large stones is seen dependently within the bladder. Surrounding soft tissue inflammation at the dome of the bladder. Urine cultures showing multiple species. Patient was placed  empirically on IV Rocephin. Patient seen in consultation by urology who recommended a Foley catheter placement, continued IV antibiotics and treat for total of approximately 10-14 days. Transitioned from IV Rocephin to oral ciprofloxacin. Patient will need outpatient follow-up with urology for further evaluation with cystoscopy.  #3 acute kidney injury Likely secondary to prerenal azotemia in the setting of UTI. Renal function improved with hydration. Follow.  #4 bladder dysfunction Per urology patient likely with long-standing bladder outlet obstruction. Patient noted to have a thick-walled bladder with CT scan. Foley cath has been placed. Patient will be discharged with a Foley catheter with outpatient follow-up with urology.  #5 multiple bladder calculi Patient has been seen in consultation by urology who recommended outpatient follow-up with cystoscopy and further management.  #6 gross hematuria Questionable etiology. Could likely be traumatic after Foley insertion and thick-walled bladder which is likely friable versus secondary to malignancy versus secondary to multiple stones noted. Bladder irrigation. Continue Foley catheter. Urology following.  #7 history of alcohol dependence Stable. No signs of withdrawal. Monitor closely.     DVT prophylaxis: SCDs Code Status: Full Family Communication: Updated patient. No family present. Disposition Plan: Home once hematuria has improved, improvement with CVA tenderness hopefully in the next 1-2 days.    Consultants:   Urology: Dr. Retta Diones 06/12/2017  Procedures:   CT abdomen and pelvis 06/11/2017  Foley catheter placement 06/12/2017  Antimicrobials:   IV Rocephin 06/12/2017>>> 06/14/2017  Oral ciprofloxacin 06/14/2017   Subjective: Foley catheter with pink urine. Patient states left CVA tenderness improving.   Objective: Vitals:   06/14/17 1505 06/14/17 1600 06/14/17 2137 06/15/17 0603  BP: (!) 148/97 133/73 (!) 144/62  (!) 153/84  Pulse: 60 (!) 55 (!) 50 73  Resp: 16  18 18   Temp: 98.7 F (37.1 C)  98.2 F (36.8  C) 97.7 F (36.5 C)  TempSrc: Oral  Oral Oral  SpO2: 100%  100% 100%  Weight:      Height:        Intake/Output Summary (Last 24 hours) at 06/15/17 1148 Last data filed at 06/15/17 0603  Gross per 24 hour  Intake              240 ml  Output             1200 ml  Net             -960 ml   Filed Weights   06/12/17 0329  Weight: 53.1 kg (117 lb 1 oz)    Examination:  General exam: Appears calm and comfortable  Respiratory system: Clear to auscultation. Respiratory effort normal. Cardiovascular system: S1 & S2 heard, RRR. No JVD, murmurs, rubs, gallops or clicks. No pedal edema. Gastrointestinal system: Abdomen is nondistended, soft and nontender. Positive left CVA with decreasing exquisite tenderness to palpation. No organomegaly or masses felt. Normal bowel sounds heard. Genitourinary: Foley catheter in place with pink urine. Central nervous system: Alert and oriented. No focal neurological deficits. Extremities: Symmetric 5 x 5 power. Skin: No rashes, lesions or ulcers Psychiatry: Judgement and insight appear normal. Mood & affect appropriate.     Data Reviewed: I have personally reviewed following labs and imaging studies  CBC:  Recent Labs Lab 06/11/17 2016 06/12/17 0348 06/13/17 0343 06/14/17 0500 06/15/17 0408  WBC 13.8* 10.4 10.8* 8.3 8.8  NEUTROABS  --  7.1  --  5.2 5.5  HGB 13.9 12.9* 11.6* 11.8* 11.4*  HCT 39.9 37.4* 34.7* 35.2* 33.4*  MCV 104.5* 103.3* 104.8* 104.1* 102.5*  PLT 337 310 305 342 316   Basic Metabolic Panel:  Recent Labs Lab 06/11/17 2016 06/12/17 0348 06/13/17 0343 06/14/17 0500  NA 139 141 136 136  K 4.5 4.0 3.5 4.4  CL 103 106 104 103  CO2 26 27 24 27   GLUCOSE 95 112* 102* 90  BUN 21* 21* 16 9  CREATININE 1.39* 1.31* 0.97 0.99  CALCIUM 10.1 9.5 9.0 9.2   GFR: Estimated Creatinine Clearance: 52.9 mL/min (by C-G formula based  on SCr of 0.99 mg/dL). Liver Function Tests:  Recent Labs Lab 06/11/17 2016  AST 15  ALT 10*  ALKPHOS 57  BILITOT 0.7  PROT 8.5*  ALBUMIN 4.0    Recent Labs Lab 06/11/17 2016  LIPASE 28   No results for input(s): AMMONIA in the last 168 hours. Coagulation Profile: No results for input(s): INR, PROTIME in the last 168 hours. Cardiac Enzymes: No results for input(s): CKTOTAL, CKMB, CKMBINDEX, TROPONINI in the last 168 hours. BNP (last 3 results) No results for input(s): PROBNP in the last 8760 hours. HbA1C: No results for input(s): HGBA1C in the last 72 hours. CBG: No results for input(s): GLUCAP in the last 168 hours. Lipid Profile: No results for input(s): CHOL, HDL, LDLCALC, TRIG, CHOLHDL, LDLDIRECT in the last 72 hours. Thyroid Function Tests: No results for input(s): TSH, T4TOTAL, FREET4, T3FREE, THYROIDAB in the last 72 hours. Anemia Panel: No results for input(s): VITAMINB12, FOLATE, FERRITIN, TIBC, IRON, RETICCTPCT in the last 72 hours. Sepsis Labs: No results for input(s): PROCALCITON, LATICACIDVEN in the last 168 hours.  Recent Results (from the past 240 hour(s))  Urine culture     Status: Abnormal   Collection Time: 06/11/17  8:26 PM  Result Value Ref Range Status   Specimen Description URINE, CLEAN CATCH  Final   Special  Requests NONE  Final   Culture MULTIPLE SPECIES PRESENT, SUGGEST RECOLLECTION (A)  Final   Report Status 06/13/2017 FINAL  Final         Radiology Studies: No results found.      Scheduled Meds: . amLODipine  10 mg Oral Daily  . atorvastatin  40 mg Oral q1800  . cilostazol  50 mg Oral BID  . ciprofloxacin  500 mg Oral BID  . feeding supplement (ENSURE ENLIVE)  237 mL Oral BID BM  . folic acid  1 mg Oral Daily  . LORazepam  0-4 mg Oral Q12H  . multivitamin with minerals  1 tablet Oral Daily  . pneumococcal 23 valent vaccine  0.5 mL Intramuscular Tomorrow-1000  . thiamine  100 mg Oral Daily   Continuous Infusions:     LOS: 2 days    Time spent: 35 minutes    Celica Kotowski, MD [facility] Pager [phone]  If 7PM-7AM, please contact night-coverage [email or web address] Password [facility] 06/15/2017, 11:48 AM

## 2017-06-15 NOTE — Care Management Important Message (Signed)
Important Message  Patient Details  Name: Joe Durham MRN: [id number] Date of Birth: [birth year]   Medicare Important Message Given:  Yes    Elliot Cousin, RN 06/15/2017, 1:09 PM

## 2017-06-15 NOTE — Care Management Note (Signed)
Case Management Note  Patient Details  Name: Joe Durham MRN: [id number] Date of Birth: 1946/01/09  Subjective/Objective:   Acute pyelonephritis                 Action/Plan: Discharge Planning: NCM spoke to pt and offered choice for HH/list proivded. Pt agreeable to [facility] for HH. Contacted [facility] for RW for home. RW delivered to pt's room. Pt states he lives alone.   PCP Howard Pouch MD  Expected Discharge Date:  06/16/2017               Expected Discharge Plan:  Home w Home Health Services  In-House Referral:  NA  Discharge planning Services  CM Consult  Post Acute Care Choice:  Home Health Choice offered to:  Patient  DME Arranged:  [facility] DME Agency:  [facility]  HH Arranged:  PT, OT [facility] Agency:  [facility]  Status of Service:  Completed, signed off  If discussed at Long Length of Stay Meetings, dates discussed:    Additional Comments:  Elliot Cousin, RN 06/15/2017, 1:06 PM

## 2017-06-16 NOTE — Progress Notes (Signed)
PROGRESS NOTE    Joe Durham  IFO:[id number] DOB: [birth year] DOA: 06/11/2017 PCP: Howard Pouch, MD   Brief Narrative:  Patient is a 71 year old gentleman history of hypertension, bladder stones who presented to the ED with gross hematuria dysuria CVA tenderness found to have acute pyelonephritis, complicated UTI with multiple stones placed empirically on IV antibiotics is seen in consultation by urology.   Assessment & Plan:   Principal Problem:   Acute pyelonephritis Active Problems:   Complicated UTI (urinary tract infection)   Gross hematuria   Uncontrolled hypertension   Bladder stones   Renal insufficiency   Uncomplicated alcohol dependence (HCC)   Acute kidney injury (HCC)   Acute cystitis with hematuria   UTI (urinary tract infection)  #1 acute pyelonephritis Patient presenting with gross hematuria, dysuria, left greater right CVA tenderness and suprapubic tenderness with urinalysis consistent with a UTI. CT abdomen and pelvis which was done showed large stones in the bladder and soft tissue inflammation about the dome of the bladder with minimal left-sided hydronephrosis. Urine cultures were obtained which showed multiple species however patient with clinical acute pyelonephritis. Patient was placed empirically on IV Rocephin. Patient still with left CVA tenderness. Continue hydration, pain management. Transitioned from IV antibiotics to oral cipro to complete course of antibiotics.  #2 complicated urinary tract infection/nephrolithiasis Patient with a complicated urinary tract infection with CT abdomen and pelvis showing diffuse irregular bladder wall thickening likely reflecting a neurogenic bladder, underlying mass cannot be entirely excluded. Wall thickening worsened since 2015. Numerous large stones is seen dependently within the bladder. Surrounding soft tissue inflammation at the dome of the bladder. Urine cultures showing multiple species. Patient was placed  empirically on IV Rocephin. Patient seen in consultation by urology who recommended a Foley catheter placement, continued IV antibiotics and treat for total of approximately 10-14 days. Transitioned from IV Rocephin to oral ciprofloxacin. Patient will need outpatient follow-up with urology for further evaluation with cystoscopy.  #3 acute kidney injury Likely secondary to prerenal azotemia in the setting of UTI. Renal function improved with hydration. Follow.  #4 bladder dysfunction Per urology patient likely with long-standing bladder outlet obstruction. Patient noted to have a thick-walled bladder with CT scan. Foley cath has been placed. Patient will be discharged with a Foley catheter with outpatient follow-up with urology.  #5 multiple bladder calculi Patient has been seen in consultation by urology who recommended outpatient follow-up with cystoscopy and further management. Per urology.  #6 gross hematuria Questionable etiology. Could likely be traumatic after Foley insertion and thick-walled bladder which is likely friable versus secondary to malignancy versus secondary to multiple stones noted. Bladder irrigation. Patient still with some hematuria. Irrigate Foley every 4 hours. Continue Foley catheter. Urology following.  #7 history of alcohol dependence Stable. No signs of withdrawal. Monitor closely.     DVT prophylaxis: SCDs Code Status: Full Family Communication: Updated patient. No family present. Disposition Plan: Home once hematuria has improved, improvement with CVA tenderness hopefully tomorrow.    Consultants:   Urology: Dr. Retta Diones 06/12/2017  Procedures:   CT abdomen and pelvis 06/11/2017  Foley catheter placement 06/12/2017  Antimicrobials:   IV Rocephin 06/12/2017>>> 06/14/2017  Oral ciprofloxacin 06/14/2017   Subjective: Foley catheter with pink urine. Patient denies chest pain. No shortness of breath. Left CVA tenderness improving.    Objective: Vitals:   06/15/17 1434 06/15/17 2149 06/16/17 0600 06/16/17 1300  BP: 131/69 (!) 152/79 137/61 117/79  Pulse: 77 97 79 87  Resp: 20 18 18  18  Temp: 98 F (36.7 C) 98.3 F (36.8 C) 99.1 F (37.3 C) 98.7 F (37.1 C)  TempSrc: Oral Oral Oral Oral  SpO2: 99% 100% 100% 97%  Weight:      Height:        Intake/Output Summary (Last 24 hours) at 06/16/17 1737 Last data filed at 06/16/17 1300  Gross per 24 hour  Intake              600 ml  Output              550 ml  Net               50 ml   Filed Weights   06/12/17 0329  Weight: 53.1 kg (117 lb 1 oz)    Examination:  General exam: Appears calm and comfortable  Respiratory system: Clear to auscultation. Respiratory effort normal. Cardiovascular system: S1 & S2 heard, RRR. No JVD, murmurs, rubs, gallops or clicks. No pedal edema. Gastrointestinal system: Abdomen is nondistended, soft and nontender. Positive left CVA with decreasing tenderness to palpation. No organomegaly or masses felt. Normal bowel sounds heard. Genitourinary: Foley catheter in place with pinkish urine. Central nervous system: Alert and oriented. No focal neurological deficits. Extremities: Symmetric 5 x 5 power. Skin: No rashes, lesions or ulcers Psychiatry: Judgement and insight appear normal. Mood & affect appropriate.     Data Reviewed: I have personally reviewed following labs and imaging studies  CBC:  Recent Labs Lab 06/11/17 2016 06/12/17 0348 06/13/17 0343 06/14/17 0500 06/15/17 0408  WBC 13.8* 10.4 10.8* 8.3 8.8  NEUTROABS  --  7.1  --  5.2 5.5  HGB 13.9 12.9* 11.6* 11.8* 11.4*  HCT 39.9 37.4* 34.7* 35.2* 33.4*  MCV 104.5* 103.3* 104.8* 104.1* 102.5*  PLT 337 310 305 342 316   Basic Metabolic Panel:  Recent Labs Lab 06/11/17 2016 06/12/17 0348 06/13/17 0343 06/14/17 0500  NA 139 141 136 136  K 4.5 4.0 3.5 4.4  CL 103 106 104 103  CO2 26 27 24 27   GLUCOSE 95 112* 102* 90  BUN 21* 21* 16 9  CREATININE 1.39*  1.31* 0.97 0.99  CALCIUM 10.1 9.5 9.0 9.2   GFR: Estimated Creatinine Clearance: 52.9 mL/min (by C-G formula based on SCr of 0.99 mg/dL). Liver Function Tests:  Recent Labs Lab 06/11/17 2016  AST 15  ALT 10*  ALKPHOS 57  BILITOT 0.7  PROT 8.5*  ALBUMIN 4.0    Recent Labs Lab 06/11/17 2016  LIPASE 28   No results for input(s): AMMONIA in the last 168 hours. Coagulation Profile: No results for input(s): INR, PROTIME in the last 168 hours. Cardiac Enzymes: No results for input(s): CKTOTAL, CKMB, CKMBINDEX, TROPONINI in the last 168 hours. BNP (last 3 results) No results for input(s): PROBNP in the last 8760 hours. HbA1C: No results for input(s): HGBA1C in the last 72 hours. CBG: No results for input(s): GLUCAP in the last 168 hours. Lipid Profile: No results for input(s): CHOL, HDL, LDLCALC, TRIG, CHOLHDL, LDLDIRECT in the last 72 hours. Thyroid Function Tests: No results for input(s): TSH, T4TOTAL, FREET4, T3FREE, THYROIDAB in the last 72 hours. Anemia Panel: No results for input(s): VITAMINB12, FOLATE, FERRITIN, TIBC, IRON, RETICCTPCT in the last 72 hours. Sepsis Labs: No results for input(s): PROCALCITON, LATICACIDVEN in the last 168 hours.  Recent Results (from the past 240 hour(s))  Urine culture     Status: Abnormal   Collection Time: 06/11/17  8:26 PM  Result Value Ref  Range Status   Specimen Description URINE, CLEAN CATCH  Final   Special Requests NONE  Final   Culture MULTIPLE SPECIES PRESENT, SUGGEST RECOLLECTION (A)  Final   Report Status 06/13/2017 FINAL  Final         Radiology Studies: No results found.      Scheduled Meds: . amLODipine  10 mg Oral Daily  . atorvastatin  40 mg Oral q1800  . cilostazol  50 mg Oral BID  . ciprofloxacin  500 mg Oral BID  . feeding supplement (ENSURE ENLIVE)  237 mL Oral BID BM  . folic acid  1 mg Oral Daily  . multivitamin with minerals  1 tablet Oral Daily  . pneumococcal 23 valent vaccine  0.5 mL  Intramuscular Tomorrow-1000  . thiamine  100 mg Oral Daily   Continuous Infusions:    LOS: 3 days    Time spent: 35 minutes    Adir Schicker, MD [facility] Pager [phone]  If 7PM-7AM, please contact night-coverage [email or web address] Password [facility] 06/16/2017, 5:37 PM

## 2017-06-17 LAB — BASIC METABOLIC PANEL
Anion gap: 7 (ref 5–15)
BUN: 14 mg/dL (ref 6–20)
CHLORIDE: 105 mmol/L (ref 101–111)
CO2: 27 mmol/L (ref 22–32)
CREATININE: 1.13 mg/dL (ref 0.61–1.24)
Calcium: 9.6 mg/dL (ref 8.9–10.3)
GFR calc Af Amer: >60 mL/min (ref >60)
GFR calc non Af Amer: >60 mL/min (ref >60)
Glucose, Bld: 106 mg/dL — ABNORMAL HIGH (ref 65–99)
Potassium: 4.8 mmol/L (ref 3.5–5.1)
Sodium: 139 mmol/L (ref 135–145)

## 2017-06-17 MED ORDER — HYDROCODONE-ACETAMINOPHEN 5-325 MG PO TABS: 1.0000 | ORAL_TABLET | ORAL | 0 refills | Status: DC | PRN

## 2017-06-17 MED ORDER — FOLIC ACID 1 MG PO TABS: 1.0000 mg | ORAL_TABLET | Freq: Every day | ORAL | Status: DC

## 2017-06-17 MED ORDER — ADULT MULTIVITAMIN W/MINERALS CH
1.0000 | ORAL_TABLET | Freq: Every day | ORAL | Status: DC
Start: 2017-06-18 — End: 2017-07-30

## 2017-06-17 MED ORDER — AMLODIPINE BESYLATE 10 MG PO TABS: 10.0000 mg | ORAL_TABLET | Freq: Every day | ORAL | 0 refills | Status: DC

## 2017-06-17 MED ORDER — CILOSTAZOL 50 MG PO TABS: 50.0000 mg | ORAL_TABLET | Freq: Two times a day (BID) | ORAL | 0 refills | Status: DC

## 2017-06-17 MED ORDER — CIPROFLOXACIN HCL 500 MG PO TABS: 500.0000 mg | ORAL_TABLET | Freq: Two times a day (BID) | ORAL | 0 refills | Status: AC

## 2017-06-17 MED ORDER — THIAMINE HCL 100 MG PO TABS: 100.0000 mg | ORAL_TABLET | Freq: Every day | ORAL | Status: DC

## 2017-06-17 MED ORDER — ENSURE ENLIVE PO LIQD: 237.0000 mL | Freq: Two times a day (BID) | ORAL | 12 refills | Status: DC

## 2017-06-17 MED ORDER — ATORVASTATIN CALCIUM 40 MG PO TABS: 40.0000 mg | ORAL_TABLET | Freq: Every day | ORAL | 0 refills | Status: DC

## 2017-06-17 NOTE — Care Management Note (Signed)
Case Management Note  Patient Details  Name: Joe Durham MRN: [id number] Date of Birth: July 18, 1946  Subjective/Objective:  May d/c home today w/[facility]HC-[facility] rep [facility]erm[facility]  already aware of HHRN-f/c care,instruction,HHPT/HHOT.home rw already delivered to rm. No further CM needs.                   Action/Plan:d/c home w/HHC/dme.   Expected Discharge Date:                  Expected Discharge Plan:  Home w Home Health Services  In-House Referral:  NA  Discharge planning Services  CM Consult  Post Acute Care Choice:  Home Health Choice offered to:  Patient  DME Arranged:  [facility] rolling DME Agency:  [facility]  HH Arranged:  PT, OT, RN [facility] Agency:  [facility]  Status of Service:  Completed, signed off  If discussed at Long Length of Stay Meetings, dates discussed:    Additional Comments:  Lanier Clam, RN 06/17/2017, 9:46 AM

## 2017-06-17 NOTE — Progress Notes (Signed)
Occupational Therapy Treatment Patient Details Name: Joe Durham MRN: [id number] DOB: 22-Aug-1946 Today's Date: 06/17/2017    History of present illness pt was admitted with acute pyelonephritis. Pt has uti and bladder calculi.  H/O COPD, polysubstance abuse and HTN   OT comments  Pt progressing towards goals, completing room and hallway functional mobility using RW with minguard assist-supervision throughout. Education provided on safety during ADLs and functional mobility transfers after return home with Pt verbalizing understanding. Pt will benefit from continued OT services to maximize Pt's safety and independence with ADLs and functional mobility upon return home.    Follow Up Recommendations  Home health OT    Equipment Recommendations  Other (comment) (spoke with case manager and tub bench not covered through insurance, Pt declining paying out of pocket for equipment at this time)          Precautions / Restrictions Precautions Precautions: Fall Restrictions Weight Bearing Restrictions: No       Mobility Bed Mobility Overal bed mobility: Independent                Transfers Overall transfer level: Needs assistance Equipment used: Rolling walker (2 wheeled) Transfers: Sit to/from Stand Sit to Stand: Supervision         General transfer comment: supervision for safety     Balance Overall balance assessment: Needs assistance   Sitting balance-Leahy Scale: Normal     Standing balance support: Bilateral upper extremity supported Standing balance-Leahy Scale: Good Standing balance comment: Pt holding conversation during hallway functional mobility with no LOB noted                            ADL either performed or assessed with clinical judgement   ADL Overall ADL's : Needs assistance/impaired     Grooming: Supervision/safety;Standing                             Tub/Shower Transfer Details (indicate cue type and reason):  discussed safety when completing tub transfer with Pt verbalizing understanding  Functional mobility during ADLs: Min guard;Rolling walker General ADL Comments: Pt requesting to complete hallway functional mobility during session, education provided during mobility regarding safety during ADL completion at home; Pt does require some verbal safety cues for RW use as Pt attempting to leave RW behind upon return to room and walk to EOB without RW (with catheter still attached to RW)                       Cognition Arousal/Alertness: Awake/alert Behavior During Therapy: WFL for tasks assessed/performed Overall Cognitive Status: Within Functional Limits for tasks assessed                                 General Comments: min verbal safety cues for RW use                           Pertinent Vitals/ Pain       Pain Assessment: No/denies pain  Frequency  Min 2X/week        Progress Toward Goals  OT Goals(current goals can now be found in the care plan section)  Progress towards OT goals: Progressing toward goals  Acute Rehab OT Goals Patient Stated Goal: return home  OT Goal Formulation: With patient Time For Goal Achievement: 06/20/17 Potential to Achieve Goals: Good  Plan Discharge plan remains appropriate                     AM-PAC PT "6 Clicks" Daily Activity     Outcome Measure   Help from another person eating meals?: None Help from another person taking care of personal grooming?: A Little Help from another person toileting, which includes using toliet, bedpan, or urinal?: A Little Help from another person bathing (including washing, rinsing, drying)?: A Little Help from another person to put on and taking off regular upper body clothing?: A Little Help from another person to put on and taking off regular lower body clothing?: A Little 6 Click Score:  19    End of Session Equipment Utilized During Treatment: Gait belt;Rolling walker  OT Visit Diagnosis: Unsteadiness on feet (R26.81)   Activity Tolerance Patient tolerated treatment well   Patient Left in bed;with call bell/phone within reach   Nurse Communication Mobility status        Time: 1610-9604 OT Time Calculation (min): 25 min  Charges: OT General Charges $OT Visit: 1 Procedure OT Treatments $Self Care/Home Management : 8-22 mins  Marcy Siren, OT Pager [phone] 06/17/2017    Joe Durham 06/17/2017, 2:48 PM

## 2017-06-17 NOTE — Discharge Summary (Signed)
Physician Discharge Summary  Joe Durham WJX:[id number] DOB: 1946-03-15 DOA: 06/11/2017  PCP: Howard Pouch, MD  Admit date: 06/11/2017 Discharge date: 06/17/2017  Time spent: 60 minutes  Recommendations for Outpatient Follow-up:  1. Follow-up with [facility] 06/26/2017 at 2 PM with Dr. Alvester Morin. 2. Follow-up with Howard Pouch, MD in 2 weeks. On follow-up patient will need a basic metabolic profile done to follow-up on electrolytes and renal function.   Discharge Diagnoses:  Principal Problem:   Acute pyelonephritis Active Problems:   Complicated UTI (urinary tract infection)   Gross hematuria   Uncontrolled hypertension   Bladder stones   Renal insufficiency   Uncomplicated alcohol dependence (HCC)   Acute kidney injury (HCC)   Acute cystitis with hematuria   UTI (urinary tract infection)   Discharge Condition: Stable and improved  Diet recommendation: Regular  Filed Weights   06/12/17 0329  Weight: 53.1 kg (117 lb 1 oz)    History of present illness:  Per Dr Clelia Croft is a 71 y.o. male with medical history significant for hypertension, polysubstance abuse, and bladder stones, now presented to the emergency department for evaluation of gross hematuria and dysuria. Patient reported that he's been experiencing suprapubic tenderness, hematuria, and dysuria intermittently for the past 3 months or so. He also reported some weight loss, subjective fevers, and night sweats over the same interval. Symptoms returned several days ago and had been becoming more severe over the past couple days. He denied chest pain or palpitations and denies any significant dyspnea or cough. He does not take blood thinners. He reported some mild intermittent flank pain bilaterally. Denied recent antibiotics. He is a current smoker.  ED Course: Upon arrival to the ED, patient was found to be afebrile, saturating well on room air, mildly tachycardic, and with vitals otherwise stable.  Chemistry panel revealled a creatinine 1.39 with no recent prior available for comparison. CBC was notable for a leukocytosis to 13,800. Urinalysis suggestted infection. CT of the abdomen and pelvis demonstrated diffuse irregular bladder wall thickening with underlying mass not excluded. Also noted on CT was large stones in the bladder and soft tissue inflammation about the dome of the bladder as well as minimal left-sided hydronephrosis without any definite obstructing stone identified, and possibly secondary to the chronic bladder wall thickening. Urology was consulted by the ED physician and advised treating the UTI and sending urine for culture. Urine was sent for culture, 500 mL normal saline was given, 4 mg IV morphine was administered, and the patient was started on empiric Rocephin. Tachycardia resolved with the IV fluids, patient remained stable in the ED, and he will be observed on the medical-surgical unit for ongoing evaluation and management UTI with gross hematuria and bladder wall thickening concerning for cystitis versus underlying mass.   Hospital Course:  #1 acute pyelonephritis Patient presented with gross hematuria, dysuria, left greater right CVA tenderness and suprapubic tenderness with urinalysis consistent with a UTI. CT abdomen and pelvis which was done showed large stones in the bladder and soft tissue inflammation about the dome of the bladder with minimal left-sided hydronephrosis. Urine cultures were obtained which showed multiple species however patient with clinical acute pyelonephritis. Patient was placed empirically on IV Rocephin, IV fluids and pain management. Patient's left CVA tenderness improved. Patient was transitioned from IV Rocephin to oral ciprofloxacin and will be discharged home on 9 more days of oral antibiotics to complete a two-week course of antibiotic treatment.  #2 complicated urinary tract infection/nephrolithiasis Patient with  a complicated urinary  tract infection with CT abdomen and pelvis showing diffuse irregular bladder wall thickening likely reflecting a neurogenic bladder, underlying mass cannot be entirely excluded. Wall thickening worsened since 2015. Numerous large stones is seen dependently within the bladder. Surrounding soft tissue inflammation at the dome of the bladder. Urine cultures showing multiple species.  Patient was placed empirically on IV Rocephin. Patient seen in consultation by urology who recommended a Foley catheter placement, continued IV antibiotics and treat for total of approximately 10-14 days. Transitioned from IV Rocephin to oral ciprofloxacin. Patient will be discharged home on 9 more days of oral ciprofloxacin to complete a two-week course of antibiotic treatment. Patient will need outpatient follow-up with urology for further evaluation with cystoscopy.  #3 acute kidney injury Likely secondary to prerenal azotemia in the setting of UTI. Renal function improved with hydration and had resolved by day of discharge.  #4 bladder dysfunction Per urology patient likely with long-standing bladder outlet obstruction. Patient noted to have a thick-walled bladder with CT scan. Foley cath placed. Patient will be discharged with a Foley catheter with outpatient follow-up with urology.  #5 multiple bladder calculi Patient has been seen in consultation by urology who recommended outpatient follow-up with cystoscopy and further management.   #6 gross hematuria Questionable etiology. Could likely be traumatic after Foley insertion and thick-walled bladder which is likely friable versus secondary to malignancy versus secondary to multiple stones noted. Bladder was irrigated hematuria cleared up and patient was discharged home with a Foley catheter with outpatient follow-up with urology.   #7 history of alcohol dependence Stable. No signs of withdrawal.    Procedures:  CT abdomen and pelvis 06/11/2017  Foley  catheter placement 06/12/2017   Consultations:  Urology: Dr. Retta Diones 06/12/2017   Discharge Exam: Vitals:   06/17/17 1121 06/17/17 1401  BP: 115/75 115/77  Pulse: 75 (!) 56  Resp:  16  Temp:  98 F (36.7 C)  SpO2:  100%    General: NAD Cardiovascular: RRR Respiratory: CTAB  Discharge Instructions   Discharge Instructions    Diet general    Complete by:  As directed    Increase activity slowly    Complete by:  As directed      Current Discharge Medication List    START taking these medications   Details  ciprofloxacin (CIPRO) 500 MG tablet Take 1 tablet (500 mg total) by mouth 2 (two) times daily. Take for 9 days then stop. Qty: 18 tablet, Refills: 0    feeding supplement, ENSURE ENLIVE, (ENSURE ENLIVE) LIQD Take 237 mLs by mouth 2 (two) times daily between meals. Qty: 237 mL, Refills: 12    folic acid (FOLVITE) 1 MG tablet Take 1 tablet (1 mg total) by mouth daily.    HYDROcodone-acetaminophen (NORCO/VICODIN) 5-325 MG tablet Take 1-2 tablets by mouth every 4 (four) hours as needed (for moderate pain with insufficient response to Toradol alone;  also for severe pain). Qty: 20 tablet, Refills: 0    Multiple Vitamin (MULTIVITAMIN WITH MINERALS) TABS tablet Take 1 tablet by mouth daily.    thiamine 100 MG tablet Take 1 tablet (100 mg total) by mouth daily.      CONTINUE these medications which have CHANGED   Details  amLODipine (NORVASC) 10 MG tablet Take 1 tablet (10 mg total) by mouth daily. Please keep 08/28/16 appt for further refills Qty: 30 tablet, Refills: 0    atorvastatin (LIPITOR) 40 MG tablet Take 1 tablet (40 mg total) by mouth daily  at 6 PM. Qty: 30 tablet, Refills: 0    cilostazol (PLETAL) 50 MG tablet Take 1 tablet (50 mg total) by mouth 2 (two) times daily. Qty: 60 tablet, Refills: 0      CONTINUE these medications which have NOT CHANGED   Details  acetaminophen (TYLENOL) 325 MG tablet Take 650 mg by mouth every 6 (six) hours as  needed for mild pain.    aspirin-sod bicarb-citric acid (ALKA-SELTZER) 325 MG TBEF tablet Take 325 mg by mouth every 6 (six) hours as needed (pain).    ibuprofen (ADVIL,MOTRIN) 200 MG tablet Take 400 mg by mouth every 6 (six) hours as needed for moderate pain.      STOP taking these medications     naproxen sodium (ANAPROX) 220 MG tablet        No Known Allergies Follow-up Information    [facility] Follow up.   Why:  8/22 @ 2 pm with Dr Owens Loffler information: [address] [place] [address] [place] [address] [phone]       [facility] - Dme Follow up.   Why:  home rolling walker Contact information: [address] [place] [address] [phone]        [facility] Follow up.   Why:  Home Health Physical Therapy and Occupational Therapy- agency will contact you to arrange initial visit [facility] nursing-foley catheter care,instruction Contact information: [address] [place] [address] [phone]        Howard Pouch, MD. Schedule an appointment as soon as possible for a visit in 2 week(s).   Specialty:  Family Medicine Contact information: [address] [place] [address] [phone]            The results of significant diagnostics from this hospitalization (including imaging, microbiology, ancillary and laboratory) are listed below for reference.    Significant Diagnostic Studies: Ct Abdomen Pelvis W Contrast  Result Date: 06/12/2017 CLINICAL DATA:  Acute onset of hematuria and penile pain, worse with urination. Chronic lower abdominal pain. Weight loss, fever and diaphoresis. Initial encounter. EXAM: CT ABDOMEN AND PELVIS WITH CONTRAST TECHNIQUE: Multidetector CT imaging of the abdomen and pelvis was performed using the standard protocol following bolus administration of intravenous contrast. CONTRAST:  80mL ISOVUE-300 IOPAMIDOL (ISOVUE-300) INJECTION 61% COMPARISON:  CT of  the abdomen and pelvis performed 03/20/2014 FINDINGS: Lower chest: The visualized lung bases are grossly clear. The visualized portions of the mediastinum are unremarkable. Hepatobiliary: The liver is unremarkable in appearance. The gallbladder is unremarkable in appearance. The common bile duct remains normal in caliber. Pancreas: The pancreas is within normal limits. Spleen: The spleen is unremarkable in appearance. Adrenals/Urinary Tract: The adrenal glands are unremarkable in appearance. There is minimal left-sided hydronephrosis, without definite evidence of distal obstructing stone. This may reflect chronic bladder inflammation. The right kidney is unremarkable in appearance. No renal or ureteral stones are identified. No perinephric stranding is seen. Stomach/Bowel: The stomach is unremarkable in appearance. The small bowel is within normal limits. The appendix is normal in caliber, without evidence of appendicitis. The colon is unremarkable in appearance. Vascular/Lymphatic: Scattered calcification is seen along the abdominal aorta and its branches. The abdominal aorta is otherwise grossly unremarkable. The inferior vena cava is grossly unremarkable. No retroperitoneal lymphadenopathy is seen. No pelvic sidewall lymphadenopathy is identified. Reproductive: Numerous large stones are seen dependently within the bladder. There is diffuse irregular bladder wall thickening, likely reflecting neurogenic bladder and chronic sequelae of  infection. Mild surrounding soft tissue inflammation is noted at the dome of the bladder. The prostate remains borderline normal in size. Other: No additional soft tissue abnormalities are seen. Musculoskeletal: No acute osseous abnormalities are identified. The patient's right hip arthroplasty is grossly unremarkable in appearance, though incompletely imaged. The visualized musculature is unremarkable in appearance. IMPRESSION: 1. Diffuse irregular bladder wall thickening, likely  reflecting neurogenic bladder and chronic sequelae of infection. Underlying mass cannot be entirely excluded. The degree of wall thickening is significantly worsened from 2015. Numerous large stones seen dependently within the bladder. Mild surrounding soft tissue inflammation at the dome of the bladder. 2. Minimal left-sided hydronephrosis, without definite evidence of distal obstructing stone. This may reflect the chronic bladder inflammation. 3. Scattered aortic atherosclerosis. Electronically Signed   By: Roanna Raider M.D.   On: 06/12/2017 00:19    Microbiology: Recent Results (from the past 240 hour(s))  Urine culture     Status: Abnormal   Collection Time: 06/11/17  8:26 PM  Result Value Ref Range Status   Specimen Description URINE, CLEAN CATCH  Final   Special Requests NONE  Final   Culture MULTIPLE SPECIES PRESENT, SUGGEST RECOLLECTION (A)  Final   Report Status 06/13/2017 FINAL  Final     Labs: Basic Metabolic Panel:  Recent Labs Lab 06/11/17 2016 06/12/17 0348 06/13/17 0343 06/14/17 0500 06/17/17 0421  NA 139 141 136 136 139  K 4.5 4.0 3.5 4.4 4.8  CL 103 106 104 103 105  CO2 26 27 24 27 27   GLUCOSE 95 112* 102* 90 106*  BUN 21* 21* 16 9 14   CREATININE 1.39* 1.31* 0.97 0.99 1.13  CALCIUM 10.1 9.5 9.0 9.2 9.6   Liver Function Tests:  Recent Labs Lab 06/11/17 2016  AST 15  ALT 10*  ALKPHOS 57  BILITOT 0.7  PROT 8.5*  ALBUMIN 4.0    Recent Labs Lab 06/11/17 2016  LIPASE 28   No results for input(s): AMMONIA in the last 168 hours. CBC:  Recent Labs Lab 06/11/17 2016 06/12/17 0348 06/13/17 0343 06/14/17 0500 06/15/17 0408  WBC 13.8* 10.4 10.8* 8.3 8.8  NEUTROABS  --  7.1  --  5.2 5.5  HGB 13.9 12.9* 11.6* 11.8* 11.4*  HCT 39.9 37.4* 34.7* 35.2* 33.4*  MCV 104.5* 103.3* 104.8* 104.1* 102.5*  PLT 337 310 305 342 316   Cardiac Enzymes: No results for input(s): CKTOTAL, CKMB, CKMBINDEX, TROPONINI in the last 168 hours. BNP: BNP (last 3  results) No results for input(s): BNP in the last 8760 hours.  ProBNP (last 3 results) No results for input(s): PROBNP in the last 8760 hours.  CBG: No results for input(s): GLUCAP in the last 168 hours.     SignedRamiro Harvest MD.  [facility] 06/17/2017, 2:19 PM

## 2017-06-18 NOTE — Progress Notes (Signed)
Received call from Dr. Janee Morn to contact patient's pharmacy since patient is unable to afford his meds. I called [facility] [phone] spoke to [facility] explained she explained to patient that he had a co pay, & a deductible, to contact customer service on insurance card-she also said he is obligated to his co pay & deductible. TC patient Joe Durham-tel#[phone]-explained that he is obligated to his co pay, & deductible.-informed him to ask family,friends to help.He asked that I contact his niece Yaakov Guthrie [phone]-informed Marcelino Duster the same thing-she voiced understanding. I have also contacted [facility] rep Clydie Braun updated on concerns-to add social worker to address community resources for med asst. Paged Dr. Janee Morn & updated. No further CM needs.

## 2017-06-24 ENCOUNTER — Ambulatory Visit (INDEPENDENT_AMBULATORY_CARE_PROVIDER_SITE_OTHER): Payer: Medicare Other | Admitting: *Deleted

## 2017-06-24 DIAGNOSIS — I6389 Other cerebral infarction: Secondary | ICD-10-CM

## 2017-06-24 DIAGNOSIS — I638 Other cerebral infarction: Secondary | ICD-10-CM | POA: Diagnosis not present

## 2017-06-25 ENCOUNTER — Telehealth: Payer: Self-pay | Admitting: *Deleted

## 2017-06-25 NOTE — Telephone Encounter (Signed)
Manual transmission received. 20 tachy episodes--ECGs appear SR/ST with TWOS.  563 "AF" episodes--available ECGs appear SR/sinus arrhythmia with ectopy and artifact.  Some episodes are indeterminate due to artifact.  All episodes <30 min duration (most 2-4 min), with exception of a 1hr duration episode--ECG suggests sinus arrhythmia w/PVCs.  ECGs printed and placed in Dr. Lauralee Evener folder for review.

## 2017-06-25 NOTE — Telephone Encounter (Signed)
Spoke with patient, requested manual transmission for review.  Walked him through transmission steps, will review when received.

## 2017-06-26 NOTE — Progress Notes (Signed)
Carelink Summary Report / Loop Recorder 

## 2017-06-30 LAB — CUP PACEART REMOTE DEVICE CHECK
Date Time Interrogation Session: 20180820013938
Implantable Pulse Generator Implant Date: 20160302

## 2017-07-01 ENCOUNTER — Inpatient Hospital Stay: Payer: Medicare Other | Admitting: Student

## 2017-07-02 ENCOUNTER — Encounter: Payer: Self-pay | Admitting: Cardiovascular Disease

## 2017-07-02 ENCOUNTER — Telehealth: Payer: Self-pay | Admitting: Cardiovascular Disease

## 2017-07-02 NOTE — Telephone Encounter (Signed)
noted 

## 2017-07-02 NOTE — Telephone Encounter (Signed)
epicd 

## 2017-07-02 NOTE — Telephone Encounter (Signed)
New Message         [facility] Pre-operative Risk Assessment    Request for surgical clearance:  1. What type of surgery is being performed?  Bladder stones   2. When is this surgery scheduled?  Not scheduled yet   3. Are there any medications that need to be held prior to surgery and how long? pletal   4. Name of physician performing surgery?  Dr Modena Slater  What is your office phone and fax number? [phone] fax [phone] [address] 07/02/2017, 11:37 AM  _________________________________________________________________   (provider comments below)

## 2017-07-04 ENCOUNTER — Telehealth: Payer: Self-pay | Admitting: *Deleted

## 2017-07-04 NOTE — Telephone Encounter (Signed)
LMOVM requesting call back to [facility].  Will request manual transmission for review of any "AF" episodes that have not transmitted automatically.  Will also schedule appointment with Dr. Royann Shivers per recall.

## 2017-07-10 NOTE — Telephone Encounter (Signed)
Spoke with patient regarding sending manual transmission. Attempted manual transmission. Sent message to Joe Durham to schedule appointment with Dr. Royann Shivers per recall and reprogramming.

## 2017-07-11 ENCOUNTER — Encounter (HOSPITAL_COMMUNITY): Payer: Self-pay

## 2017-07-11 ENCOUNTER — Other Ambulatory Visit: Payer: Self-pay | Admitting: Urology

## 2017-07-11 NOTE — Patient Instructions (Addendum)
Travus Billups  07/11/2017   Your procedure is scheduled on: 07/15/17  Report to [facility] Main  Entrance Take [place]  elevators to 3rd floor to  Short Stay Center at     0800 AM.    Call this number if you have problems the morning of surgery [phone]    Remember: ONLY 1 PERSON MAY GO WITH YOU TO SHORT STAY TO GET  READY MORNING OF YOUR SURGERY.  Do not eat food or drink liquids :After Midnight.     Take these medicines the morning of surgery with A SIP OF WATER: amlodipine                                You may not have any metal on your body including hair pins and              piercings  Do not wear jewelry,, lotions, powders or perfumes, deodorant           .              Men may shave face and neck.   Do not bring valuables to the hospital. [facility] IS NOT             RESPONSIBLE   FOR VALUABLES.  Contacts, dentures or bridgework may not be worn into surgery.  Leave suitcase in the car. After surgery it may be brought to your room.                 Please read over the following fact sheets you were given: _____________________________________________________________________            [facility] - Preparing for Surgery Before surgery, you can play an important role.  Because skin is not sterile, your skin needs to be as free of germs as possible.  You can reduce the number of germs on your skin by washing with CHG (chlorahexidine gluconate) soap before surgery.  CHG is an antiseptic cleaner which kills germs and bonds with the skin to continue killing germs even after washing. Please DO NOT use if you have an allergy to CHG or antibacterial soaps.  If your skin becomes reddened/irritated stop using the CHG and inform your nurse when you arrive at Short Stay. Do not shave (including legs and underarms) for at least 48 hours prior to the first CHG shower.  You may shave your face/neck. Please follow these instructions carefully:  1.  Shower  with CHG Soap the night before surgery and the  morning of Surgery.  2.  If you choose to wash your hair, wash your hair first as usual with your  normal  shampoo.  3.  After you shampoo, rinse your hair and body thoroughly to remove the  shampoo.                           4.  Use CHG as you would any other liquid soap.  You can apply chg directly  to the skin and wash                       Gently with a scrungie or clean washcloth.  5.  Apply the CHG Soap to your body ONLY FROM THE NECK DOWN.   Do not use  on face/ open                           Wound or open sores. Avoid contact with eyes, ears mouth and genitals (private parts).                       Wash face,  Genitals (private parts) with your normal soap.             6.  Wash thoroughly, paying special attention to the area where your surgery  will be performed.  7.  Thoroughly rinse your body with warm water from the neck down.  8.  DO NOT shower/wash with your normal soap after using and rinsing off  the CHG Soap.                9.  Pat yourself dry with a clean towel.            10.  Wear clean pajamas.            11.  Place clean sheets on your bed the night of your first shower and do not  sleep with pets. Day of Surgery : Do not apply any lotions/deodorants the morning of surgery.  Please wear clean clothes to the hospital/surgery center.  FAILURE TO FOLLOW THESE INSTRUCTIONS MAY RESULT IN THE CANCELLATION OF YOUR SURGERY PATIENT SIGNATURE_________________________________  NURSE SIGNATURE__________________________________  ________________________________________________________________________

## 2017-07-12 ENCOUNTER — Encounter (HOSPITAL_COMMUNITY)
Admission: RE | Admit: 2017-07-12 | Discharge: 2017-07-12 | Disposition: A | Discharge status: Home / Self Care | Payer: Medicare Other | Source: Ambulatory Visit | Attending: Urology | Admitting: Urology

## 2017-07-12 ENCOUNTER — Encounter (HOSPITAL_COMMUNITY): Payer: Self-pay

## 2017-07-12 DIAGNOSIS — R9431 Abnormal electrocardiogram [ECG] [EKG]: Secondary | ICD-10-CM | POA: Insufficient documentation

## 2017-07-12 DIAGNOSIS — N210 Calculus in bladder: Secondary | ICD-10-CM | POA: Insufficient documentation

## 2017-07-12 DIAGNOSIS — Z01812 Encounter for preprocedural laboratory examination: Secondary | ICD-10-CM | POA: Diagnosis present

## 2017-07-12 DIAGNOSIS — Z01810 Encounter for preprocedural cardiovascular examination: Secondary | ICD-10-CM | POA: Diagnosis present

## 2017-07-12 LAB — CBC
HEMATOCRIT: 41.1 % (ref 39.0–52.0)
Hemoglobin: 13.8 g/dL (ref 13.0–17.0)
MCH: 35 pg — AB (ref 26.0–34.0)
MCHC: 33.6 g/dL (ref 30.0–36.0)
MCV: 104.3 fL — ABNORMAL HIGH (ref 78.0–100.0)
PLATELETS: 264 10*3/uL (ref 150–400)
RBC: 3.94 MIL/uL — AB (ref 4.22–5.81)
RDW: 12.8 % (ref 11.5–15.5)
WBC: 7.6 10*3/uL (ref 4.0–10.5)

## 2017-07-12 LAB — HEPATIC FUNCTION PANEL
ALT: 14 U/L — AB (ref 17–63)
AST: 26 U/L (ref 15–41)
Albumin: 3.7 g/dL (ref 3.5–5.0)
Alkaline Phosphatase: 65 U/L (ref 38–126)
BILIRUBIN DIRECT: 0.2 mg/dL (ref 0.1–0.5)
BILIRUBIN TOTAL: 1.1 mg/dL (ref 0.3–1.2)
Indirect Bilirubin: 0.9 mg/dL (ref 0.3–0.9)
Total Protein: 7.6 g/dL (ref 6.5–8.1)

## 2017-07-12 NOTE — Progress Notes (Signed)
06/17/17 BMP epic Loop recorder check 06/30/17 epic

## 2017-07-12 NOTE — Progress Notes (Signed)
Clearance Dr. Royann Shivers 07/02/17 on chart. Lossie Faes at [facility] also faxed pt. Instructions for surgery to me at preop.and  I gave them to the patient . Pt. Stated at preop he did not have instructions on stopping Pletal this was addressed in the instructions faxed to me by Pam. Pt. Stated he had not checked his mailbox in a while.

## 2017-07-12 NOTE — Progress Notes (Signed)
FYI Pt. Was at preop appt At [facility] 07/12/17 the patient. BP was 189/88. Also his EKG showed a fib with PVC rate controlled at 64. Per clearance pt. Has  Known a fib at times.

## 2017-07-15 ENCOUNTER — Ambulatory Visit (HOSPITAL_COMMUNITY): Payer: Medicare Other

## 2017-07-15 ENCOUNTER — Observation Stay (HOSPITAL_COMMUNITY)
Admission: RE | Admit: 2017-07-15 | Discharge: 2017-07-16 | Disposition: A | Discharge status: Home / Self Care | Payer: Medicare Other | Source: Ambulatory Visit | Attending: Urology | Admitting: Urology

## 2017-07-15 ENCOUNTER — Encounter (HOSPITAL_COMMUNITY)
Admission: RE | Disposition: A | Discharge status: Home / Self Care | Payer: Self-pay | Source: Ambulatory Visit | Attending: Urology

## 2017-07-15 ENCOUNTER — Encounter (HOSPITAL_COMMUNITY): Payer: Self-pay | Admitting: Emergency Medicine

## 2017-07-15 DIAGNOSIS — R339 Retention of urine, unspecified: Secondary | ICD-10-CM | POA: Diagnosis not present

## 2017-07-15 DIAGNOSIS — N319 Neuromuscular dysfunction of bladder, unspecified: Secondary | ICD-10-CM | POA: Diagnosis not present

## 2017-07-15 DIAGNOSIS — N390 Urinary tract infection, site not specified: Secondary | ICD-10-CM | POA: Diagnosis not present

## 2017-07-15 DIAGNOSIS — E785 Hyperlipidemia, unspecified: Secondary | ICD-10-CM | POA: Insufficient documentation

## 2017-07-15 DIAGNOSIS — F17200 Nicotine dependence, unspecified, uncomplicated: Secondary | ICD-10-CM | POA: Diagnosis not present

## 2017-07-15 DIAGNOSIS — I4891 Unspecified atrial fibrillation: Secondary | ICD-10-CM | POA: Diagnosis not present

## 2017-07-15 DIAGNOSIS — Z7982 Long term (current) use of aspirin: Secondary | ICD-10-CM | POA: Diagnosis not present

## 2017-07-15 DIAGNOSIS — I10 Essential (primary) hypertension: Secondary | ICD-10-CM | POA: Diagnosis not present

## 2017-07-15 DIAGNOSIS — Z79899 Other long term (current) drug therapy: Secondary | ICD-10-CM | POA: Diagnosis not present

## 2017-07-15 DIAGNOSIS — J449 Chronic obstructive pulmonary disease, unspecified: Secondary | ICD-10-CM | POA: Diagnosis not present

## 2017-07-15 DIAGNOSIS — N210 Calculus in bladder: Secondary | ICD-10-CM | POA: Diagnosis not present

## 2017-07-15 HISTORY — PX: INSERTION OF SUPRAPUBIC CATHETER: SHX5870

## 2017-07-15 HISTORY — PX: CYSTOSCOPY WITH LITHOLAPAXY: SHX1425

## 2017-07-15 SURGERY — CYSTOSCOPY, WITH BLADDER CALCULUS LITHOLAPAXY
Anesthesia: General

## 2017-07-15 MED ORDER — CEFAZOLIN SODIUM-DEXTROSE 2-4 GM/100ML-% IV SOLN
2.0000 g | INTRAVENOUS | Status: AC
  Administered 2017-07-15: 2 g via INTRAVENOUS
  Filled 2017-07-15: qty 100

## 2017-07-15 MED ORDER — SENNOSIDES-DOCUSATE SODIUM 8.6-50 MG PO TABS
2.0000 | ORAL_TABLET | Freq: Every day | ORAL | Status: DC
  Administered 2017-07-15: 2 via ORAL
  Filled 2017-07-15: qty 2

## 2017-07-15 MED ORDER — FENTANYL CITRATE (PF) 100 MCG/2ML IJ SOLN
25.0000 ug | INTRAMUSCULAR | Status: DC | PRN
  Administered 2017-07-15 (×2): 50 ug via INTRAVENOUS

## 2017-07-15 MED ORDER — SODIUM CHLORIDE 0.9 % IV SOLN
INTRAVENOUS | Status: DC
  Administered 2017-07-15 – 2017-07-16 (×2): via INTRAVENOUS

## 2017-07-15 MED ORDER — ENSURE ENLIVE PO LIQD
237.0000 mL | Freq: Two times a day (BID) | ORAL | Status: DC
  Administered 2017-07-15 – 2017-07-16 (×2): 237 mL via ORAL

## 2017-07-15 MED ORDER — ONDANSETRON HCL 4 MG/2ML IJ SOLN
INTRAMUSCULAR | Status: DC | PRN
  Administered 2017-07-15: 4 mg via INTRAVENOUS

## 2017-07-15 MED ORDER — FENTANYL CITRATE (PF) 100 MCG/2ML IJ SOLN
INTRAMUSCULAR | Status: AC
  Filled 2017-07-15: qty 2

## 2017-07-15 MED ORDER — MORPHINE SULFATE (PF) 2 MG/ML IV SOLN
2.0000 mg | INTRAVENOUS | Status: DC | PRN
  Administered 2017-07-15 (×2): 2 mg via INTRAVENOUS
  Filled 2017-07-15 (×2): qty 1

## 2017-07-15 MED ORDER — EPHEDRINE SULFATE 50 MG/ML IJ SOLN
INTRAMUSCULAR | Status: DC | PRN
  Administered 2017-07-15 (×2): 5 mg via INTRAVENOUS

## 2017-07-15 MED ORDER — FENTANYL CITRATE (PF) 100 MCG/2ML IJ SOLN
INTRAMUSCULAR | Status: AC
Start: 2017-07-15 — End: 2017-07-15
  Filled 2017-07-15: qty 2

## 2017-07-15 MED ORDER — SUGAMMADEX SODIUM 200 MG/2ML IV SOLN
INTRAVENOUS | Status: AC
  Filled 2017-07-15: qty 2

## 2017-07-15 MED ORDER — PROMETHAZINE HCL 25 MG/ML IJ SOLN: 6.2500 mg | INTRAMUSCULAR | Status: DC | PRN

## 2017-07-15 MED ORDER — AMLODIPINE BESYLATE 10 MG PO TABS
10.0000 mg | ORAL_TABLET | Freq: Every day | ORAL | Status: DC
  Administered 2017-07-15: 10 mg via ORAL
  Filled 2017-07-15: qty 1

## 2017-07-15 MED ORDER — CEFAZOLIN SODIUM-DEXTROSE 1-4 GM/50ML-% IV SOLN
1.0000 g | Freq: Three times a day (TID) | INTRAVENOUS | Status: AC
  Administered 2017-07-15 – 2017-07-16 (×2): 1 g via INTRAVENOUS
  Filled 2017-07-15 (×3): qty 50

## 2017-07-15 MED ORDER — SUGAMMADEX SODIUM 200 MG/2ML IV SOLN
INTRAVENOUS | Status: DC | PRN
  Administered 2017-07-15: 200 mg via INTRAVENOUS

## 2017-07-15 MED ORDER — STERILE WATER FOR IRRIGATION IR SOLN
Status: DC | PRN
  Administered 2017-07-15: 500 mL

## 2017-07-15 MED ORDER — OXYCODONE HCL 5 MG PO TABS: 5.0000 mg | ORAL_TABLET | Freq: Once | ORAL | Status: DC | PRN

## 2017-07-15 MED ORDER — ROCURONIUM BROMIDE 100 MG/10ML IV SOLN
INTRAVENOUS | Status: DC | PRN
  Administered 2017-07-15: 50 mg via INTRAVENOUS

## 2017-07-15 MED ORDER — BUPIVACAINE HCL (PF) 0.25 % IJ SOLN
INTRAMUSCULAR | Status: AC
  Filled 2017-07-15: qty 30

## 2017-07-15 MED ORDER — BUPIVACAINE HCL (PF) 0.25 % IJ SOLN
INTRAMUSCULAR | Status: DC | PRN
  Administered 2017-07-15: 30 mL

## 2017-07-15 MED ORDER — HYDROMORPHONE HCL-NACL 0.5-0.9 MG/ML-% IV SOSY: 0.2500 mg | PREFILLED_SYRINGE | INTRAVENOUS | Status: DC | PRN

## 2017-07-15 MED ORDER — OXYBUTYNIN CHLORIDE ER 5 MG PO TB24
10.0000 mg | ORAL_TABLET | Freq: Every day | ORAL | Status: DC
  Administered 2017-07-15 – 2017-07-16 (×2): 10 mg via ORAL
  Filled 2017-07-15 (×2): qty 2

## 2017-07-15 MED ORDER — OXYCODONE HCL 5 MG/5ML PO SOLN: 5.0000 mg | Freq: Once | ORAL | Status: DC | PRN

## 2017-07-15 MED ORDER — ENOXAPARIN SODIUM 40 MG/0.4ML ~~LOC~~ SOLN
40.0000 mg | SUBCUTANEOUS | Status: DC
  Administered 2017-07-16: 40 mg via SUBCUTANEOUS
  Filled 2017-07-15: qty 0.4

## 2017-07-15 MED ORDER — VITAMIN B-1 100 MG PO TABS
100.0000 mg | ORAL_TABLET | Freq: Every day | ORAL | Status: DC
  Administered 2017-07-15 – 2017-07-16 (×2): 100 mg via ORAL
  Filled 2017-07-15 (×2): qty 1

## 2017-07-15 MED ORDER — DEXAMETHASONE SODIUM PHOSPHATE 10 MG/ML IJ SOLN
INTRAMUSCULAR | Status: DC | PRN
  Administered 2017-07-15: 10 mg via INTRAVENOUS

## 2017-07-15 MED ORDER — PROPOFOL 10 MG/ML IV BOLUS
INTRAVENOUS | Status: DC | PRN
  Administered 2017-07-15: 150 mg via INTRAVENOUS
  Administered 2017-07-15: 50 mg via INTRAVENOUS

## 2017-07-15 MED ORDER — LACTATED RINGERS IV SOLN
INTRAVENOUS | Status: DC
  Administered 2017-07-15 (×3): via INTRAVENOUS

## 2017-07-15 MED ORDER — HYDROMORPHONE HCL 1 MG/ML IJ SOLN
INTRAMUSCULAR | Status: DC | PRN
  Administered 2017-07-15 (×2): 1 mg via INTRAVENOUS

## 2017-07-15 MED ORDER — 0.9 % SODIUM CHLORIDE (POUR BTL) OPTIME
TOPICAL | Status: DC | PRN
  Administered 2017-07-15: 1000 mL

## 2017-07-15 MED ORDER — LIDOCAINE HCL (CARDIAC) 20 MG/ML IV SOLN
INTRAVENOUS | Status: DC | PRN
  Administered 2017-07-15: 40 mg via INTRAVENOUS
  Administered 2017-07-15: 60 mg via INTRAVENOUS

## 2017-07-15 MED ORDER — HYDROMORPHONE HCL 2 MG/ML IJ SOLN
INTRAMUSCULAR | Status: AC
  Filled 2017-07-15: qty 1

## 2017-07-15 MED ORDER — HYDROCODONE-ACETAMINOPHEN 5-325 MG PO TABS
1.0000 | ORAL_TABLET | ORAL | Status: DC | PRN
  Administered 2017-07-15 – 2017-07-16 (×3): 2 via ORAL
  Filled 2017-07-15 (×3): qty 2

## 2017-07-15 MED ORDER — FENTANYL CITRATE (PF) 100 MCG/2ML IJ SOLN
INTRAMUSCULAR | Status: DC | PRN
  Administered 2017-07-15 (×6): 50 ug via INTRAVENOUS

## 2017-07-15 MED ORDER — ATORVASTATIN CALCIUM 40 MG PO TABS
40.0000 mg | ORAL_TABLET | Freq: Every day | ORAL | Status: DC
  Administered 2017-07-15: 40 mg via ORAL
  Filled 2017-07-15: qty 1

## 2017-07-15 SURGICAL SUPPLY — 62 items
AGENT HMST KT MTR STRL THRMB (HEMOSTASIS)
AGENT HMST MTR 8 SURGIFLO (HEMOSTASIS)
BAG URINE DRAINAGE (UROLOGICAL SUPPLIES) ×6 IMPLANT
BAG URINE LEG 500ML (DRAIN) ×3 IMPLANT
BLADE EXTENDED COATED 6.5IN (ELECTRODE) ×3 IMPLANT
BLADE HEX COATED 2.75 (ELECTRODE) ×3 IMPLANT
BLADE SURG 15 STRL LF DISP TIS (BLADE) ×1 IMPLANT
BLADE SURG 15 STRL SS (BLADE) ×3
BLADE SURG SZ12 CARB STEEL (BLADE) ×3 IMPLANT
CATH FOLEY 2WAY SLVR  5CC 20FR (CATHETERS) ×2
CATH FOLEY 2WAY SLVR 30CC 22FR (CATHETERS) ×2 IMPLANT
CATH FOLEY 2WAY SLVR 5CC 20FR (CATHETERS) IMPLANT
CATH HEMA 3WAY 30CC 22FR COUDE (CATHETERS) IMPLANT
CATH HEMA 3WAY 30CC 24FR COUDE (CATHETERS) IMPLANT
CATH URET 5FR 28IN OPEN ENDED (CATHETERS) IMPLANT
CLIP VESOLOCK LG 6/CT PURPLE (CLIP) IMPLANT
COVER SURGICAL LIGHT HANDLE (MISCELLANEOUS) ×3 IMPLANT
DRAIN CHANNEL 19F RND (DRAIN) ×2 IMPLANT
DRAPE LAPAROTOMY T 102X78X121 (DRAPES) ×3 IMPLANT
DRAPE WARM FLUID 44X44 (DRAPE) ×1 IMPLANT
DRESSING TELFA ISLAND 4X8 (GAUZE/BANDAGES/DRESSINGS) ×3 IMPLANT
ELECT PENCIL ROCKER SW 15FT (MISCELLANEOUS) ×2 IMPLANT
ELECT REM PT RETURN 15FT ADLT (MISCELLANEOUS) ×2 IMPLANT
EVACUATOR SILICONE 100CC (DRAIN) ×2 IMPLANT
GAUZE SPONGE 2X2 8PLY STRL LF (GAUZE/BANDAGES/DRESSINGS) IMPLANT
GAUZE SPONGE 4X4 16PLY XRAY LF (GAUZE/BANDAGES/DRESSINGS) ×3 IMPLANT
GLOVE BIOGEL M STRL SZ7.5 (GLOVE) ×3 IMPLANT
GLOVE SS BIOGEL STRL SZ 7.5 (GLOVE) IMPLANT
GLOVE SUPERSENSE BIOGEL SZ 7.5 (GLOVE) ×6
GLOVE SURG SS PI 8.0 STRL IVOR (GLOVE) ×3 IMPLANT
GOWN STRL REUS W/TWL XL LVL3 (GOWN DISPOSABLE) ×9 IMPLANT
KIT BASIN OR (CUSTOM PROCEDURE TRAY) ×3 IMPLANT
MANIFOLD NEPTUNE II (INSTRUMENTS) ×3 IMPLANT
NEEDLE HYPO 22GX1.5 SAFETY (NEEDLE) ×8 IMPLANT
PACK CYSTO (CUSTOM PROCEDURE TRAY) ×3 IMPLANT
PACK GENERAL/GYN (CUSTOM PROCEDURE TRAY) ×3 IMPLANT
PLUG CATH AND CAP STER (CATHETERS) ×5 IMPLANT
SET IRRIG Y TYPE TUR BLADDER L (SET/KITS/TRAYS/PACK) IMPLANT
SPOGE SURGIFLO 8M (HEMOSTASIS)
SPONGE DRAIN TRACH 4X4 STRL 2S (GAUZE/BANDAGES/DRESSINGS) ×1 IMPLANT
SPONGE GAUZE 2X2 STER 10/PKG (GAUZE/BANDAGES/DRESSINGS) ×2
SPONGE SURGIFLO 8M (HEMOSTASIS) IMPLANT
STAPLER SKIN PROX WIDE 3.9 (STAPLE) IMPLANT
STAPLER VISISTAT 35W (STAPLE) ×2 IMPLANT
SURGIFLO W/THROMBIN 8M KIT (HEMOSTASIS) ×1 IMPLANT
SUT CHROMIC 3 0 SH 27 (SUTURE) ×4 IMPLANT
SUT ETHILON 2 0 PS N (SUTURE) ×5 IMPLANT
SUT ETHILON 3 0 PS 1 (SUTURE) ×6 IMPLANT
SUT PDS AB 0 CTX 60 (SUTURE) ×4 IMPLANT
SUT PDS AB 1 CTX 36 (SUTURE) ×2 IMPLANT
SUT SILK 0 (SUTURE) ×3
SUT SILK 0 30XBRD TIE 6 (SUTURE) ×1 IMPLANT
SUT SILK 2 0 (SUTURE)
SUT SILK 2-0 30XBRD TIE 12 (SUTURE) IMPLANT
SUT VIC AB 2-0 UR5 27 (SUTURE) IMPLANT
SUT VIC AB 2-0 UR6 27 (SUTURE) ×4 IMPLANT
SUT VIC AB 3-0 SH 27 (SUTURE) ×3
SUT VIC AB 3-0 SH 27XBRD (SUTURE) IMPLANT
SYR 30ML LL (SYRINGE) ×3 IMPLANT
SYR CONTROL 10ML LL (SYRINGE) ×5 IMPLANT
TOWEL OR 17X26 10 PK STRL BLUE (TOWEL DISPOSABLE) ×5 IMPLANT
TOWEL OR NON WOVEN STRL DISP B (DISPOSABLE) ×3 IMPLANT

## 2017-07-15 NOTE — Anesthesia Preprocedure Evaluation (Addendum)
Anesthesia Evaluation  Patient identified by MRN, date of birth, ID band Patient awake    Reviewed: Allergy & Precautions, H&P , NPO status , Patient's Chart, lab work & pertinent test results  History of Anesthesia Complications Negative for: history of anesthetic complications  Airway Mallampati: II  TM Distance: >3 FB Neck ROM: Full    Dental  (+) Poor Dentition, Edentulous Upper, Missing   Pulmonary neg sleep apnea, neg COPD, neg recent URI, Current Smoker,    breath sounds clear to auscultation       Cardiovascular hypertension, Pt. on medications + dysrhythmias Atrial Fibrillation  Rhythm:Regular  ECG: NSR, rate 64. PAC's  Clearance Dr. Royann Shivers 07/02/17 on chart.   Neuro/Psych CVA    GI/Hepatic negative GI ROS, (+)     substance abuse  ,   Endo/Other  negative endocrine ROS  Renal/GU negative Renal ROS     Musculoskeletal   Abdominal   Peds  Hematology negative hematology ROS (+)   Anesthesia Other Findings HLD  Reproductive/Obstetrics                            Anesthesia Physical  Anesthesia Plan  ASA: III  Anesthesia Plan: General   Post-op Pain Management:    Induction:   PONV Risk Score and Plan: 1 and Ondansetron, Dexamethasone and Midazolam  Airway Management Planned: Oral ETT  Additional Equipment:   Intra-op Plan:   Post-operative Plan: Extubation in OR  Informed Consent: I have reviewed the patients History and Physical, chart, labs and discussed the procedure including the risks, benefits and alternatives for the proposed anesthesia with the patient or authorized representative who has indicated his/her understanding and acceptance.   Dental advisory given  Plan Discussed with: CRNA  Anesthesia Plan Comments:        Anesthesia Quick Evaluation

## 2017-07-15 NOTE — H&P (Signed)
CC: I have bladder stones.  HPI: Joe Durham is a 71 year-old male patient who is here for bladder stones.  His problem was diagnosed 06/12/2017. His symptoms include blood in urine. Patient denies having flank pain, back pain, groin pain, nausea, vomiting, fever, and chills.   He has had prior urinary tract or prostate infections.   He does not have trouble emptying his bladder at this time.   Patient was recently seen by Dr.Dahlstedt in the hospital after being diagnosed with urinary tract infection, urinary retention, and bladder stones. He has a CT scan revealed a trabeculated bladder. Multiple large bladder calculi. Foley catheter was placed for maximal drainage and help with clearance of infection. He was started on antibiotics. He presents today for follow-up.   ALLERGIES: None   MEDICATIONS: Aspirin 325 mg tablet  Amlodipine Besylate 10 mg tablet Oral  Amlodipine Besylate 5 mg tablet Oral  Atorvastatin Calcium  Cilostazol 50 mg tablet  Naproxen  Oxycodone-Acetaminophen 5 mg-325 mg tablet Oral  Polyethylene Glycol 400 liquid Does Not Apply    GU PSH: None     PSH Notes: Hernia Repair   NON-GU PSH: Hernia Repair - 2015 Wrist Arthroscopy/surgery, Left   GU PMH: Bladder Stone, Bladder calculus - 2015 BPH w/LUTS, Benign localized prostatic hyperplasia with lower urinary tract symptoms (LUTS) - 2015 Urinary Tract Inf, Unspec site, Urinary tract infection - 2015   NON-GU PMH: Encounter for general adult medical examination without abnormal findings, Encounter for preventive health examination - 2015 Personal history of other diseases of the circulatory system, History of hypertension - 2015 COPD Hypertension   FAMILY HISTORY: Heart problem - Runs In Family Strokes - Mother, Sister   SOCIAL HISTORY: Marital Status: Single Race: Black or African American Current Smoking Status: Patient smokes.   Tobacco Use Assessment Completed:  Used Tobacco in last 30 days?  Does  not drink caffeine.    Notes: Alcohol use, Single, Current smoker, Occupation, Caffeine use   REVIEW OF SYSTEMS:    GU Review Male:   Patient reports frequent urination, hard to postpone urination, burning/ pain with urination, get up at night to urinate, leakage of urine, trouble starting your stream, have to strain to urinate , erection problems, and penile pain. Patient denies stream starts and stops.  Gastrointestinal (Upper):   Patient reports nausea. Patient denies vomiting and indigestion/ heartburn.  Gastrointestinal (Lower):   Patient denies diarrhea and constipation.  Constitutional:   Patient reports night sweats. Patient denies fever, weight loss, and fatigue.  Skin:   Patient denies skin rash/ lesion and itching.  Eyes:   Patient denies blurred vision and double vision.  Ears/ Nose/ Throat:   Patient denies sore throat and sinus problems.  Hematologic/Lymphatic:   Patient denies swollen glands and easy bruising.  Cardiovascular:   Patient reports chest pains. Patient denies leg swelling.  Respiratory:   Patient denies cough and shortness of breath.  Endocrine:   Patient denies excessive thirst.  Musculoskeletal:   Patient reports back pain. Patient denies joint pain.  Neurological:   Patient reports dizziness. Patient denies headaches.  Psychologic:   Patient reports depression and anxiety.    VITAL SIGNS:      07/02/2017 10:29 AM  Weight 140 lb / 63.5 kg  Height 67 in / 170.18 cm  BP 208/91 mmHg  Heart Rate 102 /min  Temperature 97.5 F / 36.3 C  BMI 21.9 kg/m   GU PHYSICAL EXAMINATION:    Anus and Perineum: No hemorrhoids. No  anal stenosis. No rectal fissure, no anal fissure. No edema, no dimple, no perineal tenderness, no anal tenderness.  Scrotum: No lesions. No edema. No cysts. No warts.  Epididymides: Right: no spermatocele, no masses, no cysts, no tenderness, no induration, no enlargement. Left: no spermatocele, no masses, no cysts, no tenderness, no induration, no  enlargement.  Testes: No tenderness, no swelling, no enlargement left testes. No tenderness, no swelling, no enlargement right testes. Normal location left testes. Normal location right testes. No mass, no cyst, no varicocele, no hydrocele left testes. No mass, no cyst, no varicocele, no hydrocele right testes.  Urethral Meatus: Normal size. No lesion, no wart, no discharge, no polyp. Normal location.  Penis: Foley catheter is in in place. Penis uncircumcised. The patient had paraphimosis, which had to be manually reduced. No foreskin warts, no cracks. No dorsal peyronie's plaques, no left corporal peyronie's plaques, no right corporal peyronie's plaques, no scarring, no shaft warts. No balanitis, no meatal stenosis.   Prostate: 40 gram or 2+ size. Left lobe normal consistency, right lobe normal consistency. Symmetrical lobes. No prostate nodule. Left lobe no tenderness, right lobe no tenderness.   Seminal Vesicles: Nonpalpable.  Sphincter Tone: Normal sphincter. No rectal tenderness. No rectal mass.    MULTI-SYSTEM PHYSICAL EXAMINATION:    Constitutional: Well-nourished. No physical deformities. Normally developed. Good grooming.  Respiratory: No labored breathing, no use of accessory muscles.   Cardiovascular: Normal temperature, adequate perfusion  Skin: No paleness, no jaundice, no cyanosis. No lesion, no ulcer, no rash.  Neurologic / Psychiatric: Oriented to time, oriented to place, oriented to person. No depression, no anxiety, no agitation.  Gastrointestinal: No mass, no tenderness, no rigidity, non obese abdomen. No significant abdominal scars  Musculoskeletal: Normal gait and station of head and neck.    PAST DATA REVIEWED:  Source Of History:  Patient   11/01/05  PSA  Total PSA 5.80   Free PSA 0.63   % Free PSA 10.9    PROCEDURES:         Catheter / SP Tube - 51702 Simple Foley Indwelling Cath Change  The patient's indwelling foley tube was carefully removed. A 18 French Foley  catheter was inserted into the bladder using sterile technique. UROJET PRIOR. 10cc balloon. The patient was taught routine catheter care. A leg bag was connected.   ASSESSMENT:      ICD-10 Details  1 GU:   Bladder Stone - N21.0   2   Urinary Retention - R33.8    PLAN:          Schedule Return Visit/Planned Activity: Next Available Appointment - Schedule Surgery         Document Letter(s):  Created for Patient: Clinical Summary        Notes:   I discussed different options for management with the patient. I discussed that endoscopically. This will be difficult given the large number of bladder calculi. Also talked about open cystolithotomy. He would like to proceed with this. I talked to him about the possible risks including but not limited to bleeding, infection, injury to surrounding structures, urinoma, blood clots, and need for prolonged catheter. He currently has urinary retention and we're going to keep the urethral catheter. We'll switch this out for him today also advised him from complications of paraphimosis and make sure that he keeps his foreskin in proper place. He'll need to be cleared by his primary care physician to come off aspirin 325 mg as well as cilostazol. If he is  unable to come off these, we can consider endoscopic treatment, though this will be difficult given the large number of stones.   Signed by Modena Slater, III, M.D. on 07/02/17 at 5:02 PM (EDT

## 2017-07-15 NOTE — Op Note (Signed)
Preoperative Diagnosis:  - Neurogenic bladder - Urinary retention - Bladder stones  Postoperative Diagnosis: Same  Procedure(s) Performed:  OPEN CYSTOLITHOTOMY INSERTION OF SUPRAPUBIC CATHETER  Surgeon(s): Crista Elliot, MD  Resident Surgeon: Budd Palmer, MD  Anesthesia:  General  Fluids:  See anesthesia record  Estimated blood loss:  25cc  Specimens:   ID Type Source Tests Collected by Time Destination  A : BLADDER STONE Calculus Calculus STONE ANALYSIS Crista Elliot, MD 07/15/2017 1135     Drains:   - 20Fr Suprapubic Foley catheter - 20Fr Urethral Foley catheter - #10 Blake drain  Complications:  None  Indications:  This is a 71 y.o. patient with a history of neurogenic bladder and urinary retention, found to have a large bladder stone burden during a recent admission for UTI.Marland Kitchen After discussion of the risks & benefits and alternatives to surgical approach, the patient wishes to proceed with open cystolithotomy and suprapubic tube insertion. Risks & benefits of the procedure discussed with the patient, who wishes to proceed.   Findings:  ~4cm of total stone burden removed from the bladder via a midline cystotomy. Suprapubic placed to the right of the cystotomy, secured with a purse-string.   Procedure: The patient was brought to the OR, placed in the supine position, and given IV antibiotics for prophylaxis. A time out was performed, and he was prepped and draped in the usual sterile fashion. A urethral catheter was placed. Next, a 6cm lower midline incision was made, and was carried down through the fascia. We dissected the bladder off of the anterior abdominal wall, careful to avoid entering the peritoneum. Two stay sutures were placed lateral to bladder midline, and a 4cm cystotomy was made. Within the bladder were ~30 smooth yellow stones, each measuring from 5-43mm in size. Each was removed from the bladder. We irrigated and thoroughly explored the  bladder to ensure the entirety of the stone burden was removed. Next, a 20Fr Foley catheter was brought into the bladder through incisions in the bladder, abdominal wall, and skin just to the right of midline. The Foley balloon was inflated with 10cc sterile water, and a pursestring suture using 3-0 chromic was placed in the bladder around the SPT to secure it in place. The SPT was capped and secured to the skin with a 2-0 Nylon.   Next, we closed the bladder in two layers, using 3-0 chromic for the mucosa, and 2-0 Vicryl for the detrusor. The urethral catheter was reinsterted and used to fill the bladder with 150cc of sterile water, and no leak was visualized. The urethral catheter was placed back to drain. Next, a #10 Blake drain was placed near the cystotomy and brought out the skin to the left of the incision, secured with a 2-0 Nylon to the skin. Next, we closed the fascia using 0 looped PDS sutures, starting at the apices and secured together in the middle. The skin was stapled and covered with an island dressing.  The patient tolerated the procedure well, with no noted intra-op complications. The patient was extubated and taken to the PACU in stable condition.   Plan: 1. Admit to urology for post-op care 2. Suprapubic catheter capped, urethral catheter to gravity.  3. Will require a cystogram in 2 weeks. If negative, will remove urethral catheter and leave SPT to drain.

## 2017-07-15 NOTE — Anesthesia Postprocedure Evaluation (Signed)
Anesthesia Post Note  Patient: Breon Bristol  Procedure(s) Performed: Procedure(s) (LRB): OPEN CYSTOLITHOTOMY (N/A) INSERTION OF SUPRAPUBIC CATHETER (N/A)     Patient location during evaluation: PACU Anesthesia Type: General Level of consciousness: awake and alert Pain management: pain level controlled Vital Signs Assessment: post-procedure vital signs reviewed and stable Respiratory status: spontaneous breathing, nonlabored ventilation, respiratory function stable and patient connected to nasal cannula oxygen Cardiovascular status: blood pressure returned to baseline and stable Postop Assessment: no signs of nausea or vomiting Anesthetic complications: no    Last Vitals:  Vitals:   07/15/17 1400 07/15/17 1426  BP: (!) 148/77 (!) 150/79  Pulse: (!) 46   Resp: (!) 21 18  Temp:  36.4 C  SpO2: 100% 99%    Last Pain:  Vitals:   07/15/17 1433  TempSrc:   PainSc: Asleep                 Klever Twyford P Samanatha Brammer

## 2017-07-15 NOTE — Anesthesia Procedure Notes (Signed)
Procedure Name: Intubation Date/Time: 07/15/2017 10:44 AM Performed by: Thornell Mule Pre-anesthesia Checklist: Patient identified, Emergency Drugs available, Suction available and Patient being monitored Patient Re-evaluated:Patient Re-evaluated prior to induction Oxygen Delivery Method: Circle system utilized Preoxygenation: Pre-oxygenation with 100% oxygen Induction Type: IV induction Ventilation: Oral airway inserted - appropriate to patient size and Two handed mask ventilation required Laryngoscope Size: Miller and 2 Grade View: Grade I Tube type: Oral Tube size: 7.5 mm Number of attempts: 1 Airway Equipment and Method: Stylet and Oral airway Placement Confirmation: ETT inserted through vocal cords under direct vision,  positive ETCO2 and breath sounds checked- equal and bilateral Tube secured with: Tape Dental Injury: Teeth and Oropharynx as per pre-operative assessment

## 2017-07-15 NOTE — Transfer of Care (Signed)
Immediate Anesthesia Transfer of Care Note  Patient: Joe Durham  Procedure(s) Performed: Procedure(s): OPEN CYSTOLITHOTOMY (N/A) INSERTION OF SUPRAPUBIC CATHETER (N/A)  Patient Location: PACU  Anesthesia Type:General  Level of Consciousness: awake, alert  and oriented  Airway & Oxygen Therapy: Patient Spontanous Breathing and Patient connected to face mask oxygen  Post-op Assessment: Report given to RN and Post -op Vital signs reviewed and stable  Post vital signs: Reviewed and stable  Last Vitals:  Vitals:   07/15/17 0756  BP: (!) 142/82  Pulse: 82  Resp: 18  Temp: 36.5 C  SpO2: 98%    Last Pain:  Vitals:   07/15/17 0756  TempSrc: Oral      Patients Stated Pain Goal: 4 (07/15/17 0834)  Complications: No apparent anesthesia complications

## 2017-07-15 NOTE — Interval H&P Note (Signed)
History and Physical Interval Note:  07/15/2017 10:31 AM  Joe Durham  has presented today for surgery, with the diagnosis of BLADDER STONES  The various methods of treatment have been discussed with the patient and family. After consideration of risks, benefits and other options for treatment, the patient has consented to  Procedure(s): OPEN CYSTOLITHOTOMY (N/A) POSSIBLE INSERTION OF SUPRAPUBIC CATHETER (N/A) as a surgical intervention .  The patient's history has been reviewed, patient examined, no change in status, stable for surgery.  I have reviewed the patient's chart and labs.  Questions were answered to the patient's satisfaction.    He states he has not been taking ASA and has stopped Pletal since last wednesday   Ray Church, III

## 2017-07-15 NOTE — Brief Op Note (Signed)
07/15/2017  12:32 PM  PATIENT:  Joe Durham  71 y.o. male  PRE-OPERATIVE DIAGNOSIS:  BLADDER STONES  POST-OPERATIVE DIAGNOSIS:  BLADDER STONES  PROCEDURE:  Procedure(s): OPEN CYSTOLITHOTOMY (N/A) INSERTION OF SUPRAPUBIC CATHETER (N/A)  SURGEON:  Surgeon(s) and Role:    * Crista Elliot, MD - Primary  PHYSICIAN ASSISTANT:   ASSISTANTS: Arlester Marker  ANESTHESIA:   general  EBL:  Total I/O In: 1000 [I.V.:1000] Out: 25 [Blood:25]  BLOOD ADMINISTERED:none  DRAINS: (right) Jackson-Pratt drain(s) with closed bulb suction in the RLQ   LOCAL MEDICATIONS USED:  MARCAINE     SPECIMEN:  Bladder calculi  DISPOSITION OF SPECIMEN:  PATHOLOGY  COUNTS:  YES  TOURNIQUET:  * No tourniquets in log *  DICTATION: .Dragon Dictation  PLAN OF CARE: Admit for overnight observation  PATIENT DISPOSITION:  Short Stay   Delay start of Pharmacological VTE agent (>24hrs) due to surgical blood loss or risk of bleeding: no

## 2017-07-15 NOTE — Op Note (Signed)
Operative Note  Preoperative diagnosis:  1. Bladder calculi 2. Urinary retention  Postoperative diagnosis: 1. Bladder calculi 2. Urinary retention  Procedure(s): 1. Open cystolithotomy with bladder stone extraction and suprapubic tube placement  Surgeon: Modena Slater, MD  Assistants:Benjamin Kathlene November, M.D. resident  Anesthesia: Gen.  Complications: none immediate  EBL: 25 mL  Specimens: 1. Bladder calculi  Drains/Catheters: 1. 20 French suprapubic catheter 2. 22 French Foley catheter to drainage 3.JP drain  Intraoperative findings: numerous bladder calculi. The bladder wall was thickened without evidence of any mass lesion noted.  Indication: this is a 71 year old male who was recently admitted with urinary tract infection. He was found to be in urinary retention. Imaging revealed multiple bladder calculi. He has been managed with a ureteral catheter and presents for the above-mentioned operation.  Description of procedure:  The patient was identified. Consent was obtained. He confirmed that he was not taking his blood thinners. He was taken back to the operating room and placed in the supine position. He was placed under general anesthesia. The table was flexed. He was prepped and draped in standard sterile fashion. A timeout was performed. A lower midline incision was made sharply and carried down with Bovie electrocautery. The anterior rectus sheath was entered and this was divided with electrocautery. The musculature was divided at the midline bluntly. Transversalis was sharply divided. The space of Retzius was bluntly dissected. There were some adhesions noted at this point secondary to his right inguinal hernia repair with mesh.  The bladder was filled with 300 mL of normal saline.Stay sutures of 2-0 Vicryl were placed into the bladder. The bladder was opened with Bovie electrocautery via the midline. The bladder stones were evacuated and passed off for specimen. The  bladder was irrigated and there were no evidence of any further calculi.A small incision was made right lateral. A small incision was made in the skin on the right.A 20 French catheter was brought through this incision through the abdominal wall and then placed into the bladder and 10 mL of sterile water was instilled. The bladder was then carefully closed in 2 layers first with 3-0 chromic sutures along the mucosal layer, then 2-0 Vicryl suture along the overlying detrusor layer. A urethral catheter was also placed at this time with 10 mL placed into the catheter balloon. The suprapubic catheter was capped. The Foley catheter remained to drainage. A JP drain was left which exited the left lower quadrant. The fascia was closed with running 0 looped PDS suture. The skin was closed with staples. MARCAINE was instilled for anesthetic affect. Both the suprapubic catheter and the JP drain were secured down with nylon stitches.The dressing was applied and this concluded the operation. Patient tolerated procedure well and was stable postoperatively.  Condition: Stable  Plan:the patient will remain in the hospital overnight. We'll follow his JP output and as long as this is acceptable that will come out tomorrow. He will keep his suprapubic catheter And keep his Foley catheter to drainage. The Foley catheter will remain for 2 weeks at which point he will have a cystogram to ensure the bladder has healed completely.The suprapubic tube will need to be changed in about 6 weeks.

## 2017-07-16 DIAGNOSIS — N210 Calculus in bladder: Secondary | ICD-10-CM | POA: Diagnosis not present

## 2017-07-16 MED ORDER — ADULT MULTIVITAMIN W/MINERALS CH: 1.0000 | ORAL_TABLET | Freq: Every day | ORAL | Status: DC

## 2017-07-16 MED FILL — HYDROCODON-APAP 5-325: 5-325 | 7 days supply | Qty: 30 | Fill #0

## 2017-07-16 NOTE — Progress Notes (Signed)
Patient is stable for discharge. Discharge instructions and medications have been reviewed with the patient and his neighbor, and all questions answered. Demonstrated foley catheter care and the patient provided return demonstration and verbalized understanding of catheter care and discharge instructions. Prescriptions provided to the patient along with his discharge instructions/AVS.  Arta Bruce [facility] 07/16/2017 2:37 PM

## 2017-07-16 NOTE — Progress Notes (Signed)
Initial Nutrition Assessment  DOCUMENTATION CODES:   Non-severe (moderate) malnutrition in context of chronic illness, Underweight  INTERVENTION:   Continue Ensure Enlive po BID, each supplement provides 350 kcal and 20 grams of protein Provide Multivitamin with minerals daily Encourage PO intake  NUTRITION DIAGNOSIS:   Malnutrition (moderate)related to chronic illness (history of ETOH abuse) as evidenced by moderate depletion of body fat, moderate depletions of muscle mass.  GOAL:   Patient will meet greater than or equal to 90% of their needs  MONITOR:   PO intake, Supplement acceptance, Labs, Weight trends, I & O's  REASON FOR ASSESSMENT:   Malnutrition Screening Tool    ASSESSMENT:   71 year-old male patient who is here for bladder stones.  Patient s/p Open cystolithotomy with bladder stone extraction and suprapubic tube placement. Pt with 0% PO intake documented d/t nausea. Pt is drinking Ensure supplements. Pt's typical intake is 2 meals a day. PT with history of daily ETOH consumption, will order daily multivitamin.   Noted discharge order in place. Will monitor for plan.  Per chart review, pt with 10 lb of weight loss since 1/10 (8% wt loss x 8 months, insignificant for time frame). Nutrition-Focused physical exam completed. Findings are moderate fat depletion, moderate muscle depletion, and no edema.   Labs reviewed. Medications: Senokot-S tablet daily, Thiamine tablet daily  Diet Order:  Diet regular Room service appropriate? Yes; Fluid consistency: Thin Diet - low sodium heart healthy  Skin:  Reviewed, no issues  Last BM:  9/10  Height:   Ht Readings from Last 1 Encounters:  07/15/17 5\' 8"  (1.727 m)    Weight:   Wt Readings from Last 1 Encounters:  07/15/17 115 lb 1.3 oz (52.2 kg)    Ideal Body Weight:  70 kg  BMI:  Body mass index is 17.5 kg/m.  Estimated Nutritional Needs:   Kcal:  1550-1750  Protein:  70-80g  Fluid:   1.7L/day  EDUCATION NEEDS:   No education needs identified at this time  Tilda Franco, MS, RD, LDN Pager: [phone] After Hours Pager: [phone]

## 2017-07-16 NOTE — Discharge Instructions (Addendum)
Please keep your suprapubic catheter capped, and be sure to keep the Foley catheter in the penis to drainage.  You may remove the dressing tomorrow   Morning.  You may begin showering tomorrow.   Activity:  You are encouraged to ambulate frequently (about every hour during waking hours) to help prevent blood clots from forming in your legs or lungs.  However, you should not engage in any heavy lifting (> 10-15 lbs), strenuous activity, or straining.   Diet: You should advance your diet as instructed by your physician.  It will be normal to have some bloating, nausea, and abdominal discomfort intermittently.   Prescriptions:  You will be provided a prescription for pain medication to take as needed.  If your pain is not severe enough to require the prescription pain medication, you may take extra strength Tylenol instead which will have less side effects.  You should also take a prescribed stool softener to avoid straining with bowel movements as the prescription pain medication may constipate you.   Incisions: You may remove your dressing bandages 48 hours after surgery if not removed in the hospital.  You will either have some small staples or special tissue glue at each of the incision sites. Once the bandages are removed (if present), the incisions may stay open to air.  You may start showering (but not soaking or bathing in water) the 2nd day after surgery and the incisions simply need to be patted dry after the shower.  No additional care is needed.  What to call us about: You should call the office [phone]) if you develop fever > 101 or develop persistent vomiting. Activity:  You are encouraged to ambulate frequently (about every hour during waking hours) to help prevent blood clots from forming in your legs or lungs.  However, you should not engage in any heavy lifting (> 10-15 lbs), strenuous activity, or straining.  Foley Catheter Care A soft, flexible tube (Foley catheter) may have  been placed in your bladder to drain urine and fluid. Follow these instructions: Taking Care of the Catheter  Keep the area where the catheter leaves your body clean.   Attach the catheter to the leg so there is no tension on the catheter.   Keep the drainage bag below the level of the bladder, but keep it OFF the floor.   Do not take long soaking baths. Your caregiver will give instructions about showering.   Wash your hands before touching ANYTHING related to the catheter or bag.   Using mild soap and warm water on a washcloth:   Clean the area closest to the catheter insertion site using a circular motion around the catheter.   Clean the catheter itself by wiping AWAY from the insertion site for several inches down the tube.   NEVER wipe upward as this could sweep bacteria up into the urethra (tube in your body that normally drains the bladder) and cause infection.   Place a small amount of sterile lubricant at the tip of the penis where the catheter is entering.  Taking Care of the Drainage Bags  Two drainage bags may be taken home: a large overnight drainage bag, and a smaller leg bag which fits underneath clothing.   It is okay to wear the overnight bag at any time, but NEVER wear the smaller leg bag at night.   Keep the drainage bag well below the level of your bladder. This prevents backflow of urine into the bladder and allows the urine  to drain freely.   Anchor the tubing to your leg to prevent pulling or tension on the catheter. Use tape or a leg strap provided by the hospital.   Empty the drainage bag when it is 1/2 to 3/4 full. Wash your hands before and after touching the bag.   Periodically check the tubing for kinks to make sure there is no pressure on the tubing which could restrict the flow of urine.  Changing the Drainage Bags  Cleanse both ends of the clean bag with alcohol before changing.   Pinch off the rubber catheter to avoid urine spillage during the  disconnection.   Disconnect the dirty bag and connect the clean one.   Empty the dirty bag carefully to avoid a urine spill.   Attach the new bag to the leg with tape or a leg strap.  Cleaning the Drainage Bags  Whenever a drainage bag is disconnected, it must be cleaned quickly so it is ready for the next use.   Wash the bag in warm, soapy water.   Rinse the bag thoroughly with warm water.   Soak the bag for 30 minutes in a solution of white vinegar and water (1 cup vinegar to 1 quart warm water).   Rinse with warm water.  SEEK MEDICAL CARE IF:   You have chills or night sweats.   You are leaking around your catheter or have problems with your catheter. It is not uncommon to have sporadic leakage around your catheter as a result of bladder spasms. If the leakage stops, there is not much need for concern. If you are uncertain, call your caregiver.   You develop side effects that you think are coming from your medicines.  SEEK IMMEDIATE MEDICAL CARE IF:   You are suddenly unable to urinate. Check to see if there are any kinks in the drainage tubing that may cause this. If you cannot find any kinks, call your caregiver immediately. This is an emergency.   You develop shortness of breath or chest pains.   Bleeding persists or clots develop in your urine.   You have a fever.   You develop pain in your back or over your lower belly (abdomen).   You develop pain or swelling in your legs.   Any problems you are having get worse rather than better.  MAKE SURE YOU:   Understand these instructions.   Will watch your condition.  Will get help right away if you are not doing well or get worse.  You may restart your blood thinner next Monday 9/17.

## 2017-07-16 NOTE — Discharge Summary (Signed)
[facility] Discharge Summary  Admit date: 07/15/2017  Discharge date and time: 07/16/17   Discharge to: Home  Discharge Service: Urology  Discharge Attending Physician:  Dr. Alvester Morin  Discharge  Diagnoses: Bladder stones  Secondary Diagnosis: Active Problems:   Bladder calculi   OR Procedures: Procedure(s): OPEN CYSTOLITHOTOMY INSERTION OF SUPRAPUBIC CATHETER 07/15/2017   Ancillary Procedures: None   Discharge Day Services: The patient was seen and examined by the Urology team both in the morning and immediately prior to discharge.  Vital signs and laboratory values were stable and within normal limits.  The physical exam was benign and unchanged and all surgical wounds were examined.  Discharge instructions were explained and all questions answered.  Subjective  No acute events overnight. Pain Controlled. No fever or chills.  Objective Blood pressure 117/67, pulse 44, temperature 98 F (36.7 C), temperature source Oral, resp. rate 18, height 5\' 8"  (1.727 m), weight 52.2 kg (115 lb 1.3 oz), SpO2 99 %.    General Appearance:        No acute distress Lungs:                       Normal work of breathing on room air Heart:                                Regular rate and rhythm Abdomen:                         Soft, non-tender, non-distended. JP drain removed, site covered with gauze. Midine incision with staples. SPT in place, capped. Urethral Foley catheter draining clear yellow urine Extremities:                      Warm and well perfused   Hospital Course:  The patient underwent an open cystolithotomy and SPT placement on 07/15/2017.  The patient tolerated the procedure well, was extubated in the OR, and afterwards was taken to the PACU for routine post-surgical care. When stable the patient was transferred to the floor.   The patient did well postoperatively.  The patient's diet was slowly advanced and at the time of discharge was tolerating a regular diet.  The patient  was discharged home 1 Day Post-Op, at which point was tolerating a regular solid diet, have adequate pain control with P.O. pain medication, and could ambulate without difficulty. The patient will follow up with [place] for post op check.   He will keep his suprapubic catheter capped, and keep his Foley catheter to drainage. The Foley catheter will remain for 2 weeks at which point he will have a cystogram to ensure the bladder has healed completely.The suprapubic tube will need to be changed in about 6 weeks.  Staples to be removed at his post-op visit.   Condition at Discharge: Improved  Discharge Medications:  Allergies as of 07/16/2017   No Known Allergies     Medication List    STOP taking these medications   cilostazol 50 MG tablet Commonly known as:  PLETAL     TAKE these medications   amLODipine 10 MG tablet Commonly known as:  NORVASC Take 10 mg by mouth daily.   amLODipine 10 MG tablet Commonly known as:  NORVASC Take 1 tablet (10 mg total) by mouth daily. Please keep 08/28/16 appt for further refills   atorvastatin 40 MG tablet Commonly  known as:  LIPITOR Take 1 tablet (40 mg total) by mouth daily at 6 PM.   feeding supplement (ENSURE ENLIVE) Liqd Take 237 mLs by mouth 2 (two) times daily between meals.   folic acid 1 MG tablet Commonly known as:  FOLVITE Take 1 tablet (1 mg total) by mouth daily.   HYDROcodone-acetaminophen 5-325 MG tablet Commonly known as:  NORCO/VICODIN Take 1-2 tablets by mouth every 4 (four) hours as needed (for moderate pain with insufficient response to Toradol alone;  also for severe pain).   multivitamin with minerals Tabs tablet Take 1 tablet by mouth daily.   thiamine 100 MG tablet Take 1 tablet (100 mg total) by mouth daily.            Discharge Care Instructions        Start     Ordered   07/16/17 0000  Increase activity slowly     07/16/17 1225   07/16/17 0000  Diet - low sodium heart healthy     07/16/17 1225

## 2017-07-16 NOTE — Care Management Obs Status (Signed)
MEDICARE OBSERVATION STATUS NOTIFICATION   Patient Details  Name: Joe Durham MRN: [id number] Date of Birth: May 27, 1946   Medicare Observation Status Notification Given:  Yes    MahabirOlegario Messier, RN 07/16/2017, 10:21 AM

## 2017-07-16 NOTE — Care Management Note (Signed)
Case Management Note  Patient Details  Name: Joe Durham MRN: [id number] Date of Birth: 19-Apr-1946  Subjective/Objective:   PT recc HHPT. Orders for HHRN-f/c instruction,HHaide,social work-patient chose [facility]-rep Kenard Gower able to accept. No further CM needs.                Action/Plan:d/c home w/HHC.   Expected Discharge Date:                  Expected Discharge Plan:  Home w Home Health Services  In-House Referral:     Discharge planning Services  CM Consult  Post Acute Care Choice:    Choice offered to:  Patient  DME Arranged:    DME Agency:     HH Arranged:  RN, PT, Nurse's Aide, Social Work [employer] Agency:  [facility]  Status of Service:  Completed, signed off  If discussed at [employer], dates discussed:    Additional Comments:  Lanier Clam, RN 07/16/2017, 11:31 AM

## 2017-07-18 ENCOUNTER — Telehealth: Payer: Self-pay | Admitting: Cardiovascular Disease

## 2017-07-18 LAB — STONE ANALYSIS
Ammonium Acid Urate: 35 %
Ca phos cry stone ql IR: 25 %
MAGNESIUM AMMON PHOS: 40 %
Stone Weight KSTONE: 23449 mg

## 2017-07-18 NOTE — Telephone Encounter (Signed)
Closed Encounter  °

## 2017-07-22 NOTE — Telephone Encounter (Signed)
Patient scheduled for 07/30/17 with [facility] at 1:40. Will schedule industry to reprogram AF episodes to >1 hour on LINQ per [facility].

## 2017-07-23 ENCOUNTER — Ambulatory Visit (INDEPENDENT_AMBULATORY_CARE_PROVIDER_SITE_OTHER): Payer: Medicare Other | Admitting: *Deleted

## 2017-07-23 DIAGNOSIS — I638 Other cerebral infarction: Secondary | ICD-10-CM | POA: Diagnosis not present

## 2017-07-23 DIAGNOSIS — I6389 Other cerebral infarction: Secondary | ICD-10-CM

## 2017-07-24 NOTE — Progress Notes (Signed)
Carelink Summary Report / Loop Recorder 

## 2017-07-25 LAB — CUP PACEART REMOTE DEVICE CHECK
Date Time Interrogation Session: 20180919013844
MDC IDC PG IMPLANT DT: 20160302

## 2017-07-30 ENCOUNTER — Encounter: Payer: Self-pay | Admitting: Cardiovascular Disease

## 2017-07-30 ENCOUNTER — Telehealth: Payer: Self-pay | Admitting: Cardiovascular Disease

## 2017-07-30 ENCOUNTER — Ambulatory Visit (INDEPENDENT_AMBULATORY_CARE_PROVIDER_SITE_OTHER): Payer: Medicare Other | Admitting: Cardiovascular Disease

## 2017-07-30 VITALS — BP 142/76 | HR 86 | Ht 68.0 in | Wt 119.2 lb

## 2017-07-30 DIAGNOSIS — I6621 Occlusion and stenosis of right posterior cerebral artery: Secondary | ICD-10-CM | POA: Diagnosis not present

## 2017-07-30 DIAGNOSIS — I441 Atrioventricular block, second degree: Secondary | ICD-10-CM

## 2017-07-30 DIAGNOSIS — I4719 Other supraventricular tachycardia: Secondary | ICD-10-CM

## 2017-07-30 DIAGNOSIS — Z79899 Other long term (current) drug therapy: Secondary | ICD-10-CM

## 2017-07-30 DIAGNOSIS — Z4509 Encounter for adjustment and management of other cardiac device: Secondary | ICD-10-CM

## 2017-07-30 DIAGNOSIS — I10 Essential (primary) hypertension: Secondary | ICD-10-CM

## 2017-07-30 DIAGNOSIS — I739 Peripheral vascular disease, unspecified: Secondary | ICD-10-CM

## 2017-07-30 DIAGNOSIS — E785 Hyperlipidemia, unspecified: Secondary | ICD-10-CM

## 2017-07-30 DIAGNOSIS — I471 Supraventricular tachycardia: Secondary | ICD-10-CM | POA: Diagnosis not present

## 2017-07-30 MED ORDER — ASPIRIN EC 81 MG PO TBEC: 81.0000 mg | DELAYED_RELEASE_TABLET | Freq: Every day | ORAL | 3 refills | Status: DC

## 2017-07-30 MED ORDER — ATORVASTATIN CALCIUM 40 MG PO TABS: 40.0000 mg | ORAL_TABLET | Freq: Every day | ORAL | 3 refills | Status: DC

## 2017-07-30 NOTE — Patient Instructions (Signed)
Dr Royann Shivers has recommended making the following medication changes: 1. START Aspirin 81 mg daily 2. START Atorvastatin 40 mg daily 3. CONTINUE Amlodipine 10 mg daily  Your physician recommends that you return for lab work in 3-4 months - FASTING.  Your physician recommends that you schedule a follow-up appointment in 4 months with Dr Royann Shivers.  Dr Royann Shivers recommends that you schedule a follow-up appointment in 6 months with Dr Allyson Sabal. You will receive a reminder letter in the mail two months in advance. If you don't receive a letter, please call our office to schedule the follow-up appointment.  If you need a refill on your cardiac medications before your next appointment, please call your pharmacy.

## 2017-07-30 NOTE — Telephone Encounter (Signed)
error 

## 2017-07-30 NOTE — Progress Notes (Signed)
Cardiology Office Note    Date:  07/30/2017   ID:  Joe Durham, DOB 15-Aug-1946, MRN [id number]  PCP:  Patient, No Pcp Per  Cardiologist:   Thurmon Fair, MD   Chief Complaint  Patient presents with  . Follow-up    pt has no complaints today     History of Present Illness:  Joe Durham is a 71 y.o. male with PAD and a history of acute ischemic stroke 2 to right posterior cerebral artery embolism in February 2016, with residual left lower quadrantopsia. His implantable loop recorder has shown frequent episodes of paroxysmal atrial tachycardia as well as frequent episodes of irregular rhythm secondary to Mobitz type I second-degree AV block.   He has lost weight due to poor appetite. He had hemorrhagic cystitis due to a large urinary bladder stone and underwent cystolithotomy on September 11. He had a suprapubic catheter and an indwelling Foley catheter and a JP drain. He still has the Foley in place, but the other tubes have been removed.  He denies chest pain or shortness of breath with activity and has not had leg edema. He seems to describe fairly typical intermittent claudication of the left calf, walking across a large parking lot. It is quite reproducible.  Additional medical problems include treated hypertension and hyperlipidemia. Lower showed arterial duplex showed ABI 0.6 bilaterally and occlusion of the right popliteal, bilateral posterior tibials and left peroneal.  Cilostazol was prescribed by Dr. Allyson Sabal in January, but was stopped for his cystolithotomy. Is not clear to me whether it helped his claudication.   Past Medical History:  Diagnosis Date  . Bladder calculi   . Bladder stones   . COPD (chronic obstructive pulmonary disease) (HCC)   . Diplopia    PT STATES WHEN LYING DOWN HE SEES DOUPLE ,  WHEN HE SITS UP IT CLEARS UP  . Dysrhythmia    loop recorder  . Fracture of nasal bone    03-20-2014   MILD COMMUNIUTED  . Hypertension   . Renal insufficiency  06/12/2017  . Short of breath on exertion   . Stroke (HCC)    2016  . Wears glasses   . Wrist fracture, left    S/P  ORIF  03-20-2014--  has hard cast on    Past Surgical History:  Procedure Laterality Date  . CYSTOSCOPY WITH LITHOLAPAXY N/A 07/15/2017   Procedure: OPEN CYSTOLITHOTOMY;  Surgeon: Crista Elliot, MD;  Location: [facility];  Service: Urology;  Laterality: N/A;  . INGUINAL HERNIA REPAIR Right 2011  . INSERTION OF SUPRAPUBIC CATHETER N/A 07/15/2017   Procedure: INSERTION OF SUPRAPUBIC CATHETER;  Surgeon: Crista Elliot, MD;  Location: [facility];  Service: Urology;  Laterality: N/A;  . JOINT REPLACEMENT     R hip replacement 2000  . LOOP RECORDER IMPLANT N/A 01/05/2015   Procedure: LOOP RECORDER IMPLANT;  Surgeon: Thurmon Fair, MD;  [facility];  Service: Cardiovascular;  Laterality: N/A;  . ORIF WRIST FRACTURE Left 03/20/2014   Procedure: OPEN REDUCTION INTERNAL FIXATION (ORIF) WRIST FRACTURE;  Surgeon: Dominica Severin, MD;  Location: [facility];  Service: Orthopedics;  Laterality: Left;  . TEE WITHOUT CARDIOVERSION N/A 01/05/2015   Procedure: TRANSESOPHAGEAL ECHOCARDIOGRAM (TEE)/LOOP;  Surgeon: Thurmon Fair, MD;  [facility];  Service: Cardiovascular;  Laterality: N/A;    Current Medications: Outpatient Medications Prior to Visit  Medication Sig Dispense Refill  . amLODipine (NORVASC) 10 MG tablet Take 10 mg by mouth daily.    Marland Kitchen  amLODipine (NORVASC) 10 MG tablet Take 1 tablet (10 mg total) by mouth daily. Please keep 08/28/16 appt for further refills (Patient not taking: Reported on 07/15/2017) 30 tablet 0  . atorvastatin (LIPITOR) 40 MG tablet Take 1 tablet (40 mg total) by mouth daily at 6 PM. (Patient not taking: Reported on 07/30/2017) 30 tablet 0  . feeding supplement, ENSURE ENLIVE, (ENSURE ENLIVE) LIQD Take 237 mLs by mouth 2 (two) times daily between meals. (Patient not taking: Reported on 07/12/2017) 237 mL 12  . folic acid (FOLVITE) 1 MG tablet Take 1  tablet (1 mg total) by mouth daily. (Patient not taking: Reported on 07/12/2017)    . HYDROcodone-acetaminophen (NORCO/VICODIN) 5-325 MG tablet Take 1-2 tablets by mouth every 4 (four) hours as needed (for moderate pain with insufficient response to Toradol alone;  also for severe pain). 20 tablet 0  . Multiple Vitamin (MULTIVITAMIN WITH MINERALS) TABS tablet Take 1 tablet by mouth daily. (Patient not taking: Reported on 07/12/2017)    . thiamine 100 MG tablet Take 1 tablet (100 mg total) by mouth daily. (Patient not taking: Reported on 07/12/2017)     No facility-administered medications prior to visit.      Allergies:   Patient has no known allergies.   Social History   Social History  . Marital status: Single    Spouse name: N/A  . Number of children: 4  . Years of education: 9   Occupational History  . retired     Holiday representative   Social History Main Topics  . Smoking status: Current Every Day Smoker    Packs/day: 0.25    Years: 52.00    Types: Cigarettes  . Smokeless tobacco: Never Used     Comment: pt is down to 3 cig per day from 1 ppd  . Alcohol use 0.0 oz/week     Comment: " a fifth and a half a month", 04/05/15 1 pint every week  . Drug use: No  . Sexual activity: Not Currently    Birth control/ protection: None   Other Topics Concern  . None   Social History Narrative   Single   Right handed   Caffeine use - sodas on weekends with alcohol     Family History:  The patient's family history includes Stroke in his mother and sister.   ROS:   Please see the history of present illness.    ROS All other systems reviewed and are negative.   PHYSICAL EXAM:   VS:  BP (!) 142/76 (BP Location: Left Arm, Patient Position: Sitting, Cuff Size: Normal)   Pulse 86   Ht  (1.727 m)   Wt 119 lb 3.2 oz (54.1 kg)   SpO2 99%   BMI 18.12 kg/m    GEN: Very slender/undernourished, well developed, in no acute distress  HEENT: normal  Neck: no JVD, carotid bruits, or  masses Cardiac: irregular; no murmurs, rubs, or gallops,no edema , healthy loop recorder site Respiratory:  clear to auscultation bilaterally, normal work of breathing GI: soft, nontender, nondistended, + BS MS: no deformity or atrophy  Skin: warm and dry, no rash Neuro:  Alert and Oriented x 3, Strength and sensation are intact Psych: euthymic mood, full affect  Wt Readings from Last 3 Encounters:  07/30/17 119 lb 3.2 oz (54.1 kg)  07/15/17 115 lb 1.3 oz (52.2 kg)  07/12/17 115 lb (52.2 kg)      Studies/Labs Reviewed:   EKG:  EKG is not ordered today.  Loop recorder download today shows frequent episodes of second-degree AV block Mobitz type I, without significant bradycardia. Occasional episodes of paroxysmal atrial tachycardia are also seen. There is no true atrial fibrillation.  Lipid Panel    Component Value Date/Time   CHOL 271 (H) 01/04/2015 0855   TRIG 79 01/04/2015 0855   HDL 70 01/04/2015 0855   CHOLHDL 3.9 01/04/2015 0855   VLDL 16 01/04/2015 0855   LDLCALC 185 (H) 01/04/2015 0855     ASSESSMENT:    1. PAT (paroxysmal atrial tachycardia) (HCC)   2. Mobitz type 1 second degree AV block   3. Essential hypertension   4. Dyslipidemia   5. PAD (peripheral artery disease) (HCC)   6. Encounter for loop recorder check   7. 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 Medication management      PLAN:  In order of problems listed above:   1. PAT: Asymptomatic, does not require therapy. Avoid beta blockers and centrally acting calcium channel blockers due to AV block. 2. Second-degree AV block, MT 1: Avoid negative chronotropic. Has not had significant bradycardia or syncope. Pacemaker therapy not indicated. 3. HTN: Fair control on amlodipine monotherapy 4. HLP: Need to restart his statin. Atorvastatin 40 mg daily prescribed again. Recheck lipid profile in 3 months. 5. PAD: He has primarily infrapopliteal disease bilaterally. At this point not  particular symptomatic, since his surgical procedure has limited his activity level. Really hard to say whether the cilostazol had any impact on his symptoms. He doesn't seem to remember. He has a lot of difficulty identifying his different medications and does not really understand which purpose each medication service. Part of the problem is his ability to read and write is very limited. 6. ILR: Loop recorder parameters reset to avoid electrograms of "pseudo-fibrillation" related to irregular rhythm with tachycardia degree AV block. 7. ASCVD: Sequelae of previous stroke/visual field defect probably due to severe stenosis of proximal right posterior cerebral artery. No evidence of atrial fibrillation identified today.    Medication Adjustments/Labs and Tests Ordered: Current medicines are reviewed at length with the patient today.  Concerns regarding medicines are outlined above.  Medication changes, Labs and Tests ordered today are listed in the Patient Instructions below. There are no Patient Instructions on file for this visit.   Signed, Thurmon Fair, MD  07/30/2017 1:55 PM    [facility] [address] [place], [place], [place]  [address] Phone: [phone]; Fax: [phone]

## 2017-08-02 ENCOUNTER — Telehealth: Payer: Self-pay | Admitting: *Deleted

## 2017-08-02 NOTE — Telephone Encounter (Signed)
Spoke with patient, assisted him with sending manual Carelink transmission.  Will review when transmission is received.  Transmission received.  All available "AF" episodes are false.  New LINQ programming from 9/25 appointment was successfully synced with Carelink network.

## 2017-08-02 NOTE — Telephone Encounter (Signed)
Spoke with patient to request manual Carelink transmission.  He requests that I call back in a few minutes to assist him with transmitting.

## 2017-08-22 ENCOUNTER — Ambulatory Visit (INDEPENDENT_AMBULATORY_CARE_PROVIDER_SITE_OTHER): Payer: Medicare Other | Admitting: *Deleted

## 2017-08-22 DIAGNOSIS — I6389 Other cerebral infarction: Secondary | ICD-10-CM | POA: Diagnosis not present

## 2017-08-23 LAB — CUP PACEART REMOTE DEVICE CHECK
Date Time Interrogation Session: 20181019020758
MDC IDC PG IMPLANT DT: 20160302

## 2017-08-23 NOTE — Progress Notes (Signed)
Carelink Summary Report / Loop Recorder 

## 2017-09-23 ENCOUNTER — Ambulatory Visit (INDEPENDENT_AMBULATORY_CARE_PROVIDER_SITE_OTHER): Payer: Medicare Other | Admitting: *Deleted

## 2017-09-23 DIAGNOSIS — I6389 Other cerebral infarction: Secondary | ICD-10-CM | POA: Diagnosis not present

## 2017-09-24 NOTE — Progress Notes (Signed)
Carelink Summary Report / Loop Recorder 

## 2017-10-10 ENCOUNTER — Other Ambulatory Visit: Payer: Self-pay | Admitting: Cardiovascular Disease

## 2017-10-10 LAB — CUP PACEART REMOTE DEVICE CHECK
Date Time Interrogation Session: 20181118030843
MDC IDC PG IMPLANT DT: 20160302

## 2017-10-11 ENCOUNTER — Other Ambulatory Visit: Payer: Self-pay | Admitting: Cardiovascular Disease

## 2017-10-21 ENCOUNTER — Ambulatory Visit (INDEPENDENT_AMBULATORY_CARE_PROVIDER_SITE_OTHER): Payer: Medicare Other | Admitting: *Deleted

## 2017-10-21 DIAGNOSIS — I6389 Other cerebral infarction: Secondary | ICD-10-CM | POA: Diagnosis not present

## 2017-10-22 NOTE — Progress Notes (Signed)
Carelink Summary Report / Loop Recorder 

## 2017-11-05 DIAGNOSIS — I219 Acute myocardial infarction, unspecified: Secondary | ICD-10-CM

## 2017-11-05 HISTORY — DX: Acute myocardial infarction, unspecified: I21.9

## 2017-11-06 LAB — CUP PACEART REMOTE DEVICE CHECK
Date Time Interrogation Session: 20181218044538
MDC IDC PG IMPLANT DT: 20160302

## 2017-11-20 ENCOUNTER — Ambulatory Visit (INDEPENDENT_AMBULATORY_CARE_PROVIDER_SITE_OTHER): Payer: Medicare Other | Admitting: *Deleted

## 2017-11-20 DIAGNOSIS — I6389 Other cerebral infarction: Secondary | ICD-10-CM

## 2017-11-21 NOTE — Progress Notes (Signed)
Carelink Summary Report / Loop Recorder 

## 2017-11-29 LAB — CUP PACEART REMOTE DEVICE CHECK
Implantable Pulse Generator Implant Date: 20160302
MDC IDC SESS DTM: 20190117110922

## 2017-12-20 ENCOUNTER — Ambulatory Visit (INDEPENDENT_AMBULATORY_CARE_PROVIDER_SITE_OTHER): Payer: Medicare Other | Admitting: *Deleted

## 2017-12-20 DIAGNOSIS — I6389 Other cerebral infarction: Secondary | ICD-10-CM

## 2017-12-23 ENCOUNTER — Telehealth: Payer: Self-pay | Admitting: *Deleted

## 2017-12-23 NOTE — Progress Notes (Signed)
Carelink Summary Report / Loop Recorder 

## 2017-12-23 NOTE — Telephone Encounter (Signed)
LVMOM regarding symptoms associate with brady episode 12/20/17 at 10:27. EGM appears 2:1 AV block. Gave [facility] phone number to call back.

## 2017-12-25 NOTE — Telephone Encounter (Signed)
Patient returning your call. Informed him I would send you a message to return his call. Pt verbalized understanding.

## 2017-12-25 NOTE — Telephone Encounter (Signed)
Spoke with patient regarding symptoms associated with brady episode 12/20/17 at 10:27. Patient states no symptoms and he may have still been asleep. Attempted to schedule patient for 4 month f/u appointment. Patient states he needs to discuss the dates and times with his mom for transportation and will call us back.

## 2018-01-08 NOTE — Telephone Encounter (Signed)
Message sent to Hilma Favors to schedule patient for 4 month f/u.

## 2018-01-14 ENCOUNTER — Telehealth: Payer: Self-pay | Admitting: Cardiovascular Disease

## 2018-01-14 NOTE — Telephone Encounter (Signed)
Closed Encounter  °

## 2018-01-20 ENCOUNTER — Other Ambulatory Visit: Payer: Self-pay | Admitting: Cardiovascular Disease

## 2018-01-20 LAB — CUP PACEART REMOTE DEVICE CHECK
Implantable Pulse Generator Implant Date: 20160302
MDC IDC SESS DTM: 20190217191737

## 2018-01-22 ENCOUNTER — Ambulatory Visit (INDEPENDENT_AMBULATORY_CARE_PROVIDER_SITE_OTHER): Payer: Medicare Other | Admitting: *Deleted

## 2018-01-22 DIAGNOSIS — I6389 Other cerebral infarction: Secondary | ICD-10-CM

## 2018-01-23 ENCOUNTER — Ambulatory Visit (INDEPENDENT_AMBULATORY_CARE_PROVIDER_SITE_OTHER): Payer: Medicare Other | Admitting: Cardiovascular Disease

## 2018-01-23 ENCOUNTER — Encounter: Payer: Self-pay | Admitting: Cardiovascular Disease

## 2018-01-23 VITALS — BP 132/74 | HR 66 | Ht 68.0 in | Wt 129.0 lb

## 2018-01-23 DIAGNOSIS — E785 Hyperlipidemia, unspecified: Secondary | ICD-10-CM | POA: Diagnosis not present

## 2018-01-23 DIAGNOSIS — Z79899 Other long term (current) drug therapy: Secondary | ICD-10-CM | POA: Diagnosis not present

## 2018-01-23 DIAGNOSIS — Z9889 Other specified postprocedural states: Secondary | ICD-10-CM

## 2018-01-23 DIAGNOSIS — I471 Supraventricular tachycardia: Secondary | ICD-10-CM | POA: Diagnosis not present

## 2018-01-23 DIAGNOSIS — I10 Essential (primary) hypertension: Secondary | ICD-10-CM

## 2018-01-23 DIAGNOSIS — I739 Peripheral vascular disease, unspecified: Secondary | ICD-10-CM

## 2018-01-23 DIAGNOSIS — I441 Atrioventricular block, second degree: Secondary | ICD-10-CM | POA: Diagnosis not present

## 2018-01-23 DIAGNOSIS — I679 Cerebrovascular disease, unspecified: Secondary | ICD-10-CM | POA: Diagnosis not present

## 2018-01-23 MED ORDER — ATORVASTATIN CALCIUM 40 MG PO TABS: 40.0000 mg | ORAL_TABLET | Freq: Every day | ORAL | 3 refills | Status: DC

## 2018-01-23 NOTE — Progress Notes (Signed)
Carelink Summary Report / Loop Recorder 

## 2018-01-23 NOTE — Progress Notes (Signed)
Cardiology Office Note    Date:  01/23/2018   ID:  Joe Durham, DOB [birth year], MRN [id number]  PCP:  Patient, No Pcp Per  Cardiologist:   Thurmon Fair, MD   No chief complaint on file.   History of Present Illness:  Joe Durham is a 72 y.o. male with PAD and a history of acute ischemic stroke 2 to right posterior cerebral artery embolism in February 2016, with residual left lower quadrantopsia. His implantable loop recorder has shown frequent episodes of paroxysmal atrial tachycardia as well as frequent episodes of irregular rhythm secondary to Mobitz type I second-degree AV block.   He lost a lot of weight after his bladder surgery, but has gained back quite a bit of it.  Is very lean but is no longer malnourished.  The patient specifically denies any chest pain at rest exertion, dyspnea at rest or with exertion, orthopnea, paroxysmal nocturnal dyspnea, syncope, palpitations, focal neurological deficits, lower extremity edema, unexplained weight gain, cough, hemoptysis or wheezing.  Intermittent claudication of the left calf seems better.  His rhythm today is quite irregular due to episodes of Wenckebach AV block.  He is unaware of the arrhythmia.  He has not had syncope, fatigue, dizziness or other symptoms of bradycardia.  He denies palpitations.  His loop recorder often diagnoses "atrial fibrillation" due to AV block-related irregular rhythm, episodes of PAT and often T wave oversensing as well.  Additional medical problems include treated hypertension and hyperlipidemia. Lower extremity arterial duplex showed ABI 0.6 bilaterally and occlusion of the right popliteal, bilateral posterior tibials and left peroneal.  He has not been taking his statin since "it ran out".   Past Medical History:  Diagnosis Date  . Bladder calculi   . Bladder stones   . COPD (chronic obstructive pulmonary disease) (HCC)   . Diplopia    PT STATES WHEN LYING DOWN HE SEES DOUPLE ,  WHEN HE SITS  UP IT CLEARS UP  . Dysrhythmia    loop recorder  . Fracture of nasal bone    03-20-2014   MILD COMMUNIUTED  . Hypertension   . Renal insufficiency 06/12/2017  . Short of breath on exertion   . Stroke (HCC)    2016  . Wears glasses   . Wrist fracture, left    S/P  ORIF  03-20-2014--  has hard cast on    Past Surgical History:  Procedure Laterality Date  . CYSTOSCOPY WITH LITHOLAPAXY N/A 07/15/2017   Procedure: OPEN CYSTOLITHOTOMY;  Surgeon: Crista Elliot, MD;  Location: [facility];  Service: Urology;  Laterality: N/A;  . INGUINAL HERNIA REPAIR Right 2011  . INSERTION OF SUPRAPUBIC CATHETER N/A 07/15/2017   Procedure: INSERTION OF SUPRAPUBIC CATHETER;  Surgeon: Crista Elliot, MD;  Location: [facility];  Service: Urology;  Laterality: N/A;  . JOINT REPLACEMENT     R hip replacement 2000  . LOOP RECORDER IMPLANT N/A 01/05/2015   Procedure: LOOP RECORDER IMPLANT;  Surgeon: Thurmon Fair, MD;  [facility];  Service: Cardiovascular;  Laterality: N/A;  . ORIF WRIST FRACTURE Left 03/20/2014   Procedure: OPEN REDUCTION INTERNAL FIXATION (ORIF) WRIST FRACTURE;  Surgeon: Dominica Severin, MD;  Location: [facility];  Service: Orthopedics;  Laterality: Left;  . TEE WITHOUT CARDIOVERSION N/A 01/05/2015   Procedure: TRANSESOPHAGEAL ECHOCARDIOGRAM (TEE)/LOOP;  Surgeon: Thurmon Fair, MD;  [facility];  Service: Cardiovascular;  Laterality: N/A;    Current Medications: Outpatient Medications Prior to Visit  Medication Sig Dispense Refill  .  amLODipine (NORVASC) 10 MG tablet Take 10 mg by mouth daily.    Marland Kitchen aspirin EC 81 MG tablet Take 1 tablet (81 mg total) by mouth daily. 90 tablet 3  . atorvastatin (LIPITOR) 40 MG tablet Take 1 tablet (40 mg total) by mouth daily at 6 PM. (Patient not taking: Reported on 01/23/2018) 90 tablet 3   No facility-administered medications prior to visit.      Allergies:   Patient has no known allergies.   Social History   Socioeconomic History  .  Marital status: Single    Spouse name: Not on file  . Number of children: 4  . Years of education: 9  . Highest education level: Not on file  Occupational History  . Occupation: retired    Comment: Training and development officer  . Financial resource strain: Not on file  . Food insecurity:    Worry: Not on file    Inability: Not on file  . Transportation needs:    Medical: Not on file    Non-medical: Not on file  Tobacco Use  . Smoking status: Current Every Day Smoker    Packs/day: 0.25    Years: 52.00    Pack years: 13.00    Types: Cigarettes  . Smokeless tobacco: Never Used  . Tobacco comment: pt is down to 3 cig per day from 1 ppd  Substance and Sexual Activity  . Alcohol use: Yes    Alcohol/week: 0.0 oz    Comment: " a fifth and a half a month", 04/05/15 1 pint every week  . Drug use: No  . Sexual activity: Not Currently    Birth control/protection: None  Lifestyle  . Physical activity:    Days per week: Not on file    Minutes per session: Not on file  . Stress: Not on file  Relationships  . Social connections:    Talks on phone: Not on file    Gets together: Not on file    Attends religious service: Not on file    Active member of club or organization: Not on file    Attends meetings of clubs or organizations: Not on file    Relationship status: Not on file  Other Topics Concern  . Not on file  Social History Narrative   Single   Right handed   Caffeine use - sodas on weekends with alcohol     Family History:  The patient's family history includes Stroke in his mother and sister.   ROS:   Please see the history of present illness.    ROS All other systems reviewed and are negative.   PHYSICAL EXAM:   VS:  BP 132/74   Pulse 66   Ht 5\' 8"  (1.727 m)   Wt 129 lb (58.5 kg)   BMI 19.61 kg/m     General: Alert, oriented x3, no distress, very lean Head: no evidence of trauma, PERRL, EOMI, no exophtalmos or lid lag, no myxedema, no xanthelasma; normal ears,  nose and oropharynx Neck: normal jugular venous pulsations and no hepatojugular reflux; brisk carotid pulses without delay and no carotid bruits Chest: clear to auscultation, no signs of consolidation by percussion or palpation, normal fremitus, symmetrical and full respiratory excursions Cardiovascular: normal position and quality of the apical impulse, irregular rhythm, normal first and second heart sounds, no murmurs, rubs or gallops.  Loop recorder site looks healthy, but the device is easily seen since he is so skinny Abdomen: no tenderness or distention, no masses  by palpation, no abnormal pulsatility or arterial bruits, normal bowel sounds, no hepatosplenomegaly Extremities: no clubbing, cyanosis or edema; 2+ radial, ulnar and brachial pulses bilaterally; 2+ right femoral, posterior tibial and dorsalis pedis pulses; 2+ left femoral, posterior tibial and dorsalis pedis pulses; no subclavian or femoral bruits Neurological: grossly nonfocal Psych: Normal mood and affect   Wt Readings from Last 3 Encounters:  07/30/17 119 lb 3.2 oz (54.1 kg)  07/15/17 115 lb 1.3 oz (52.2 kg)  07/12/17 115 lb (52.2 kg)      Studies/Labs Reviewed:   EKG:  EKG is ordered today.  Shows sinus rhythm with second-degree AV block Mobitz type I and left ventricular hypertrophy and secondary repolarization abnormalities  Lipid Panel    Component Value Date/Time   CHOL 271 (H) 01/04/2015 0855   TRIG 79 01/04/2015 0855   HDL 70 01/04/2015 0855   CHOLHDL 3.9 01/04/2015 0855   VLDL 16 01/04/2015 0855   LDLCALC 185 (H) 01/04/2015 0855     ASSESSMENT:    No diagnosis found.   PLAN:  In order of problems listed above:    1. Second-degree AV block, MT 1: Avoid negative chronotropic drugs.  Asymptomatic.  Does not need a pacemaker yet. 2. PAT: Asymptomatic.  Specific therapy not needed.  Has never had documented atrial fibrillation. 3. HTN: controlled 4. HLP: Resume statin. Recheck lipid profile in 3  months. 5. PAD: Primarily infrapopliteal and not amenable to easy percutaneous revascularization.  Not lifestyle limiting.  Encourage walking to limit of pain. 6. ILR: artifactual "pseudo-fibrillation" related to irregular rhythm with PAT with Mobitz 1, 2nd degree AV block. 7. ASCVD: on ASA. Sequelae of previous stroke/visual field defect probably due to severe stenosis of proximal right posterior cerebral artery. No evidence of atrial fibrillation identified to date.    Medication Adjustments/Labs and Tests Ordered: Current medicines are reviewed at length with the patient today.  Concerns regarding medicines are outlined above.  Medication changes, Labs and Tests ordered today are listed in the Patient Instructions below. There are no Patient Instructions on file for this visit.   Signed, Thurmon Fair, MD  01/23/2018 10:52 AM    [facility] [address] [place], [place], [place]  [address] Phone: [phone]; Fax: [phone]

## 2018-01-23 NOTE — Patient Instructions (Signed)
Dr Royann Shivers has recommended making the following medication changes: 1. RESTART Atorvastatin!  Your physician recommends that you return for lab work in 2 months - FASTING.  Dr Royann Shivers recommends that you schedule a follow-up appointment in 12 months. You will receive a reminder letter in the mail two months in advance. If you don't receive a letter, please call our office to schedule the follow-up appointment.  If you need a refill on your cardiac medications before your next appointment, please call your pharmacy.

## 2018-02-12 ENCOUNTER — Telehealth: Payer: Self-pay | Admitting: Cardiology

## 2018-02-12 NOTE — Telephone Encounter (Signed)
Spoke w/ pt and requested that he send a manual transmission b/c his home monitor has not updated in at least 14 days.   

## 2018-02-13 NOTE — Telephone Encounter (Signed)
Spoke with patient to assist him with sending a manual Carelink transmission for review of 2 "AF" episodes that did not transmit automatically.  Transmission successful.  ECGs appear SR w/PACs, not AF.  ECGs printed and placed in Dr. Lauralee Evener folder for review.

## 2018-02-24 ENCOUNTER — Ambulatory Visit (INDEPENDENT_AMBULATORY_CARE_PROVIDER_SITE_OTHER): Payer: Medicare Other | Admitting: *Deleted

## 2018-02-24 DIAGNOSIS — I441 Atrioventricular block, second degree: Secondary | ICD-10-CM | POA: Diagnosis not present

## 2018-02-25 NOTE — Progress Notes (Signed)
Carelink Summary Report / Loop Recorder 

## 2018-02-26 ENCOUNTER — Telehealth: Payer: Self-pay | Admitting: *Deleted

## 2018-02-26 NOTE — Telephone Encounter (Signed)
Spoke with patient to obtain manual Carelink transmission for review of "AF" episode from 02/23/18, duration 1hr .  Patient sent transmission, ECG appears SR w/PACs and artifact.  Example included below.  Patient also reports noting brief, sharp, "shooting" pains near his loop recorder beginning last week.  These last up to a second and occur with both activity and at rest, no ShOB.  Pains not particularly bothersome as they do not last long.  Advised patient that this sounds like nerve pain, but I will route to Dr. Royann Shivers for any additional recommendations.  Patient is appreciative and denies questions at this time.

## 2018-02-28 NOTE — Telephone Encounter (Signed)
Thank you. Pain does not sound cardiac or device-related.  Lots of srtifact, but seems similar to his previous episodes of background sinus rhythm with frequent PACs, plus underersensing due to artifact.

## 2018-03-04 LAB — CUP PACEART REMOTE DEVICE CHECK
Date Time Interrogation Session: 20190321120943
Implantable Pulse Generator Implant Date: 20160302

## 2018-03-04 NOTE — Telephone Encounter (Signed)
Spoke with patient to update him.  Patient is appreciative of information and denies additional questions or concerns at this time.

## 2018-03-24 LAB — CUP PACEART REMOTE DEVICE CHECK
Implantable Pulse Generator Implant Date: 20160302
MDC IDC SESS DTM: 20190423133951

## 2018-03-28 LAB — COMPREHENSIVE METABOLIC PANEL
ALK PHOS: 75 IU/L (ref 39–117)
ALT: 8 IU/L (ref 0–44)
AST: 23 IU/L (ref 0–40)
Albumin/Globulin Ratio: 1.3 (ref 1.2–2.2)
Albumin: 4.3 g/dL (ref 3.5–4.8)
BILIRUBIN TOTAL: 0.7 mg/dL (ref 0.0–1.2)
BUN/Creatinine Ratio: 10 (ref 10–24)
BUN: 9 mg/dL (ref 8–27)
CHLORIDE: 101 mmol/L (ref 96–106)
CO2: 23 mmol/L (ref 20–29)
Calcium: 9.5 mg/dL (ref 8.6–10.2)
Creatinine, Ser: 0.92 mg/dL (ref 0.76–1.27)
GFR calc non Af Amer: 84 mL/min/{1.73_m2} (ref >59)
GFR, EST AFRICAN AMERICAN: 97 mL/min/{1.73_m2} (ref >59)
Globulin, Total: 3.4 g/dL (ref 1.5–4.5)
Glucose: 61 mg/dL — ABNORMAL LOW (ref 65–99)
POTASSIUM: 3.9 mmol/L (ref 3.5–5.2)
Sodium: 142 mmol/L (ref 134–144)
Total Protein: 7.7 g/dL (ref 6.0–8.5)

## 2018-03-28 LAB — LIPID PANEL
CHOLESTEROL TOTAL: 173 mg/dL (ref 100–199)
Chol/HDL Ratio: 2.3 ratio (ref 0.0–5.0)
HDL: 76 mg/dL (ref >39)
LDL Calculated: 87 mg/dL (ref 0–99)
TRIGLYCERIDES: 49 mg/dL (ref 0–149)
VLDL Cholesterol Cal: 10 mg/dL (ref 5–40)

## 2018-04-01 ENCOUNTER — Ambulatory Visit (INDEPENDENT_AMBULATORY_CARE_PROVIDER_SITE_OTHER): Payer: Medicare Other | Admitting: *Deleted

## 2018-04-01 DIAGNOSIS — I6389 Other cerebral infarction: Secondary | ICD-10-CM

## 2018-04-01 NOTE — Progress Notes (Signed)
Carelink Summary Report / Loop Recorder 

## 2018-04-11 ENCOUNTER — Telehealth: Payer: Self-pay | Admitting: *Deleted

## 2018-04-11 LAB — CUP PACEART REMOTE DEVICE CHECK
Date Time Interrogation Session: 20190526133627
MDC IDC PG IMPLANT DT: 20160302

## 2018-04-11 NOTE — Telephone Encounter (Signed)
Spoke with patient regarding LINQ at [facility] as of 04/01/18.  Patient wishes to leave LINQ in place for right now as it is not bothering him.  He is aware to unplug home monitor.  Unenrolled from [place], return kit ordered.  Next f/u with Dr. Sallyanne Kuster is due 01/2019.  Patient is appreciative of call and denies additional questions or concerns at this time.

## 2018-04-11 NOTE — Telephone Encounter (Signed)
Thank you Irving Burton.  A lot less false positive tracings to review starting today.

## 2018-05-28 ENCOUNTER — Other Ambulatory Visit: Payer: Self-pay | Admitting: Cardiovascular Disease

## 2018-05-29 ENCOUNTER — Other Ambulatory Visit: Payer: Self-pay | Admitting: Cardiovascular Disease

## 2018-07-01 ENCOUNTER — Other Ambulatory Visit: Payer: Self-pay | Admitting: Urology

## 2018-07-03 NOTE — Progress Notes (Signed)
LOV CARDIOLOGY DR [id number] 01-23-18 Epic   EKG 01-23-18 Epic   LAST DEVICE CHECK 04-01-18 Epic

## 2018-07-03 NOTE — Patient Instructions (Signed)
Joe Durham  07/03/2018   Your procedure is scheduled on: 07-09-18   Report to [facility] Main  Entrance    Report to admitting at 7:30AM    Call this number if you have problems the morning of surgery [phone]     Remember: Do not eat food or drink liquids :After Midnight.     Take these medicines the morning of surgery with A SIP OF WATER: AMLODIPINE                                 You may not have any metal on your body including hair pins and              piercings  Do not wear jewelry, make-up, lotions, powders or perfumes, deodorant                      Men may shave face and neck.   Do not bring valuables to the hospital. [facility] IS NOT             RESPONSIBLE   FOR VALUABLES.  Contacts, dentures or bridgework may not be worn into surgery.       Patients discharged the day of surgery will not be allowed to drive home.  Name and phone number of your driver:  Special Instructions: N/A              Please read over the following fact sheets you were given: _____________________________________________________________________             [facility] - Preparing for Surgery Before surgery, you can play an important role.  Because skin is not sterile, your skin needs to be as free of germs as possible.  You can reduce the number of germs on your skin by washing with CHG (chlorahexidine gluconate) soap before surgery.  CHG is an antiseptic cleaner which kills germs and bonds with the skin to continue killing germs even after washing. Please DO NOT use if you have an allergy to CHG or antibacterial soaps.  If your skin becomes reddened/irritated stop using the CHG and inform your nurse when you arrive at Short Stay. Do not shave (including legs and underarms) for at least 48 hours prior to the first CHG shower.  You may shave your face/neck. Please follow these instructions carefully:  1.  Shower with CHG Soap the night before surgery and  the  morning of Surgery.  2.  If you choose to wash your hair, wash your hair first as usual with your  normal  shampoo.  3.  After you shampoo, rinse your hair and body thoroughly to remove the  shampoo.                           4.  Use CHG as you would any other liquid soap.  You can apply chg directly  to the skin and wash                       Gently with a scrungie or clean washcloth.  5.  Apply the CHG Soap to your body ONLY FROM THE NECK DOWN.   Do not use on face/ open  Wound or open sores. Avoid contact with eyes, ears mouth and genitals (private parts).                       Wash face,  Genitals (private parts) with your normal soap.             6.  Wash thoroughly, paying special attention to the area where your surgery  will be performed.  7.  Thoroughly rinse your body with warm water from the neck down.  8.  DO NOT shower/wash with your normal soap after using and rinsing off  the CHG Soap.                9.  Pat yourself dry with a clean towel.            10.  Wear clean pajamas.            11.  Place clean sheets on your bed the night of your first shower and do not  sleep with pets. Day of Surgery : Do not apply any lotions/deodorants the morning of surgery.  Please wear clean clothes to the hospital/surgery center.  FAILURE TO FOLLOW THESE INSTRUCTIONS MAY RESULT IN THE CANCELLATION OF YOUR SURGERY PATIENT SIGNATURE_________________________________  NURSE SIGNATURE__________________________________  ________________________________________________________________________

## 2018-07-04 ENCOUNTER — Encounter (HOSPITAL_COMMUNITY): Payer: Self-pay

## 2018-07-04 ENCOUNTER — Encounter (HOSPITAL_COMMUNITY)
Admission: RE | Admit: 2018-07-04 | Discharge: 2018-07-04 | Disposition: A | Discharge status: Home / Self Care | Payer: Medicare Other | Source: Ambulatory Visit

## 2018-07-04 HISTORY — DX: Peripheral vascular disease, unspecified: I73.9

## 2018-07-04 HISTORY — DX: Supraventricular tachycardia: I47.1

## 2018-07-04 HISTORY — DX: Atrioventricular block, second degree: I44.1

## 2018-07-04 HISTORY — DX: Other supraventricular tachycardia: I47.19

## 2018-07-04 NOTE — Patient Instructions (Addendum)
Joe Durham  07/04/2018   Your procedure is scheduled on:  07-09-2018  Report to [facility] Main  Entrance  Report to admitting at  7:30 AM    Call this number if you have problems the morning of surgery [phone]   Remember: Do not eat food or drink liquids :After Midnight.     Take these medicines the morning of surgery with A SIP OF WATER:  AMLODIPINE                                You may not have any metal on your body including hair pins and              piercings               Do not wear jewelry, make-up, lotions, powders or perfumes, deodorant              Men may shave face and neck.   Do not bring valuables to the hospital. [place] IS NOT             RESPONSIBLE   FOR VALUABLES.  Contacts, dentures or bridgework may not be worn into surgery.  Leave suitcase in the car. After surgery it may be brought to your room.     Patients discharged the day of surgery will not be allowed to drive home.  Name and phone number of your driver:   Friend-- Lester  #?  Special Instructions: N/A              Please read over the following fact sheets you were given: _____________________________________________________________________             [facility] - Preparing for Surgery Before surgery, you can play an important role.  Because skin is not sterile, your skin needs to be as free of germs as possible.  You can reduce the number of germs on your skin by washing with CHG (chlorahexidine gluconate) soap before surgery.  CHG is an antiseptic cleaner which kills germs and bonds with the skin to continue killing germs even after washing. Please DO NOT use if you have an allergy to CHG or antibacterial soaps.  If your skin becomes reddened/irritated stop using the CHG and inform your nurse when you arrive at Short Stay. Do not shave (including legs and underarms) for at least 48 hours prior to the first CHG shower.  You may shave your  face/neck. Please follow these instructions carefully:  1.  Shower with CHG Soap the night before surgery and the  morning of Surgery.  2.  If you choose to wash your hair, wash your hair first as usual with your  normal  shampoo.  3.  After you shampoo, rinse your hair and body thoroughly to remove the  shampoo.                                        4.  Use CHG as you would any other liquid soap.  You can apply chg directly  to the skin and wash                       Gently with a scrungie or clean washcloth.  5.  Apply the CHG Soap to your body ONLY FROM THE NECK DOWN.   Do not use on face/ open                           Wound or open sores. Avoid contact with eyes, ears mouth and genitals (private parts).                       Wash face,  Genitals (private parts) with your normal soap.             6.  Wash thoroughly, paying special attention to the area where your surgery  will be performed.  7.  Thoroughly rinse your body with warm water from the neck down.  8.  DO NOT shower/wash with your normal soap after using and rinsing off  the CHG Soap.             9.  Pat yourself dry with a clean towel.            10.  Wear clean pajamas.            11.  Place clean sheets on your bed the night of your first shower and do not  sleep with pets. Day of Surgery : Do not apply any lotions/deodorants the morning of surgery.  Please wear clean clothes to the hospital/surgery center.  FAILURE TO FOLLOW THESE INSTRUCTIONS MAY RESULT IN THE CANCELLATION OF YOUR SURGERY PATIENT SIGNATURE_________________________________  NURSE SIGNATURE__________________________________  ________________________________________________________________________

## 2018-07-04 NOTE — Progress Notes (Signed)
EKG 01-23-18 Epic LOV CARDIOLOGY DR CROITORU 01-23-18 Epic LAST LOOP RECORDER CHECK NOTE 04-01-18 EPIC

## 2018-07-08 ENCOUNTER — Encounter (HOSPITAL_COMMUNITY): Payer: Self-pay

## 2018-07-08 ENCOUNTER — Encounter (HOSPITAL_COMMUNITY)
Admission: RE | Admit: 2018-07-08 | Discharge: 2018-07-08 | Disposition: A | Discharge status: Home / Self Care | Payer: Medicare Other | Source: Ambulatory Visit | Attending: Urology | Admitting: Urology

## 2018-07-08 ENCOUNTER — Other Ambulatory Visit: Payer: Self-pay

## 2018-07-08 DIAGNOSIS — R339 Retention of urine, unspecified: Secondary | ICD-10-CM | POA: Insufficient documentation

## 2018-07-08 DIAGNOSIS — Z01812 Encounter for preprocedural laboratory examination: Secondary | ICD-10-CM | POA: Diagnosis not present

## 2018-07-08 HISTORY — DX: Other forms of dyspnea: R06.09

## 2018-07-08 HISTORY — DX: Presence of urogenital implants: Z96.0

## 2018-07-08 HISTORY — DX: Personal history of other diseases of urinary system: Z87.448

## 2018-07-08 HISTORY — DX: Personal history of other specified conditions: Z87.898

## 2018-07-08 HISTORY — DX: Dyspnea, unspecified: R06.00

## 2018-07-08 HISTORY — DX: Personal history of transient ischemic attack (TIA), and cerebral infarction without residual deficits: Z86.73

## 2018-07-08 HISTORY — DX: Unspecified osteoarthritis, unspecified site: M19.90

## 2018-07-08 LAB — BASIC METABOLIC PANEL
ANION GAP: 11 (ref 5–15)
BUN: 9 mg/dL (ref 8–23)
CHLORIDE: 98 mmol/L (ref 98–111)
CO2: 28 mmol/L (ref 22–32)
Calcium: 10 mg/dL (ref 8.9–10.3)
Creatinine, Ser: 1.18 mg/dL (ref 0.61–1.24)
GFR calc Af Amer: >60 mL/min (ref >60)
GLUCOSE: 117 mg/dL — AB (ref 70–99)
POTASSIUM: 4.1 mmol/L (ref 3.5–5.1)
SODIUM: 137 mmol/L (ref 135–145)

## 2018-07-08 LAB — CBC
HCT: 43.6 % (ref 39.0–52.0)
HEMOGLOBIN: 15.2 g/dL (ref 13.0–17.0)
MCH: 36.5 pg — AB (ref 26.0–34.0)
MCHC: 34.9 g/dL (ref 30.0–36.0)
MCV: 104.8 fL — AB (ref 78.0–100.0)
Platelets: 260 10*3/uL (ref 150–400)
RBC: 4.16 MIL/uL — AB (ref 4.22–5.81)
RDW: 15.2 % (ref 11.5–15.5)
WBC: 7.8 10*3/uL (ref 4.0–10.5)

## 2018-07-08 NOTE — Progress Notes (Addendum)
EKG dated 01-23-2018 in epic.  Last office cardiologist visit dated 01-23-2018 in epic.  Last LOOP recorder check in epic, dated 04-01-2018.  Per pt had not been taking is lipitor due to unaware he was to continue per cardiology note 01-23-2018.  Pt verbalized understanding of advise to call his cardiologist office and speak with a nurse because per dr note he was told to continue lipitor and asa.

## 2018-07-09 ENCOUNTER — Encounter (HOSPITAL_COMMUNITY): Admission: RE | Payer: Self-pay | Source: Ambulatory Visit

## 2018-07-09 ENCOUNTER — Ambulatory Visit (HOSPITAL_COMMUNITY): Admission: RE | Admit: 2018-07-09 | Payer: Medicare Other | Source: Ambulatory Visit | Admitting: Urology

## 2018-07-09 SURGERY — TURP (TRANSURETHRAL RESECTION OF PROSTATE)
Anesthesia: General

## 2018-09-03 ENCOUNTER — Inpatient Hospital Stay (HOSPITAL_COMMUNITY): Payer: Medicare Other

## 2018-09-03 ENCOUNTER — Emergency Department (HOSPITAL_COMMUNITY): Payer: Medicare Other

## 2018-09-03 ENCOUNTER — Encounter (HOSPITAL_COMMUNITY)
Admission: EM | Disposition: A | Discharge status: Skilled Nursing Facility | Payer: Self-pay | Source: Home / Self Care | Attending: Cardiothoracic Surgery

## 2018-09-03 ENCOUNTER — Encounter (HOSPITAL_COMMUNITY): Payer: Self-pay

## 2018-09-03 ENCOUNTER — Inpatient Hospital Stay (HOSPITAL_COMMUNITY)
Admission: EM | Admit: 2018-09-03 | Discharge: 2018-09-12 | DRG: 234 | Disposition: A | Discharge status: Skilled Nursing Facility | Payer: Medicare Other | Attending: Cardiothoracic Surgery | Admitting: Cardiothoracic Surgery

## 2018-09-03 ENCOUNTER — Other Ambulatory Visit: Payer: Self-pay

## 2018-09-03 DIAGNOSIS — I340 Nonrheumatic mitral (valve) insufficiency: Secondary | ICD-10-CM | POA: Diagnosis not present

## 2018-09-03 DIAGNOSIS — I272 Pulmonary hypertension, unspecified: Secondary | ICD-10-CM | POA: Diagnosis present

## 2018-09-03 DIAGNOSIS — E440 Moderate protein-calorie malnutrition: Secondary | ICD-10-CM

## 2018-09-03 DIAGNOSIS — I6522 Occlusion and stenosis of left carotid artery: Secondary | ICD-10-CM | POA: Diagnosis present

## 2018-09-03 DIAGNOSIS — I358 Other nonrheumatic aortic valve disorders: Secondary | ICD-10-CM | POA: Diagnosis present

## 2018-09-03 DIAGNOSIS — D62 Acute posthemorrhagic anemia: Secondary | ICD-10-CM | POA: Diagnosis not present

## 2018-09-03 DIAGNOSIS — Z96641 Presence of right artificial hip joint: Secondary | ICD-10-CM | POA: Diagnosis present

## 2018-09-03 DIAGNOSIS — I25110 Atherosclerotic heart disease of native coronary artery with unstable angina pectoris: Secondary | ICD-10-CM | POA: Diagnosis present

## 2018-09-03 DIAGNOSIS — I10 Essential (primary) hypertension: Secondary | ICD-10-CM | POA: Diagnosis present

## 2018-09-03 DIAGNOSIS — J90 Pleural effusion, not elsewhere classified: Secondary | ICD-10-CM

## 2018-09-03 DIAGNOSIS — I739 Peripheral vascular disease, unspecified: Secondary | ICD-10-CM | POA: Diagnosis present

## 2018-09-03 DIAGNOSIS — I482 Chronic atrial fibrillation, unspecified: Secondary | ICD-10-CM | POA: Diagnosis not present

## 2018-09-03 DIAGNOSIS — F17210 Nicotine dependence, cigarettes, uncomplicated: Secondary | ICD-10-CM | POA: Diagnosis present

## 2018-09-03 DIAGNOSIS — D72829 Elevated white blood cell count, unspecified: Secondary | ICD-10-CM | POA: Diagnosis not present

## 2018-09-03 DIAGNOSIS — I214 Non-ST elevation (NSTEMI) myocardial infarction: Principal | ICD-10-CM

## 2018-09-03 DIAGNOSIS — I2510 Atherosclerotic heart disease of native coronary artery without angina pectoris: Secondary | ICD-10-CM

## 2018-09-03 DIAGNOSIS — Z79899 Other long term (current) drug therapy: Secondary | ICD-10-CM | POA: Diagnosis not present

## 2018-09-03 DIAGNOSIS — Z951 Presence of aortocoronary bypass graft: Secondary | ICD-10-CM | POA: Diagnosis not present

## 2018-09-03 DIAGNOSIS — R079 Chest pain, unspecified: Secondary | ICD-10-CM | POA: Diagnosis present

## 2018-09-03 DIAGNOSIS — E785 Hyperlipidemia, unspecified: Secondary | ICD-10-CM | POA: Diagnosis present

## 2018-09-03 DIAGNOSIS — Z87442 Personal history of urinary calculi: Secondary | ICD-10-CM | POA: Diagnosis not present

## 2018-09-03 DIAGNOSIS — E782 Mixed hyperlipidemia: Secondary | ICD-10-CM | POA: Diagnosis not present

## 2018-09-03 DIAGNOSIS — Z8673 Personal history of transient ischemic attack (TIA), and cerebral infarction without residual deficits: Secondary | ICD-10-CM

## 2018-09-03 DIAGNOSIS — Z823 Family history of stroke: Secondary | ICD-10-CM

## 2018-09-03 DIAGNOSIS — I252 Old myocardial infarction: Secondary | ICD-10-CM

## 2018-09-03 DIAGNOSIS — M1990 Unspecified osteoarthritis, unspecified site: Secondary | ICD-10-CM | POA: Diagnosis present

## 2018-09-03 DIAGNOSIS — I441 Atrioventricular block, second degree: Secondary | ICD-10-CM | POA: Diagnosis present

## 2018-09-03 DIAGNOSIS — Z01810 Encounter for preprocedural cardiovascular examination: Secondary | ICD-10-CM | POA: Diagnosis not present

## 2018-09-03 DIAGNOSIS — J449 Chronic obstructive pulmonary disease, unspecified: Secondary | ICD-10-CM | POA: Diagnosis present

## 2018-09-03 DIAGNOSIS — E8770 Fluid overload, unspecified: Secondary | ICD-10-CM | POA: Diagnosis not present

## 2018-09-03 DIAGNOSIS — I471 Supraventricular tachycardia: Secondary | ICD-10-CM | POA: Diagnosis not present

## 2018-09-03 DIAGNOSIS — N320 Bladder-neck obstruction: Secondary | ICD-10-CM | POA: Diagnosis present

## 2018-09-03 HISTORY — PX: LEFT HEART CATH AND CORONARY ANGIOGRAPHY: CATH118249

## 2018-09-03 LAB — I-STAT TROPONIN, ED: TROPONIN I, POC: 0.72 ng/mL — AB (ref 0.00–0.08)

## 2018-09-03 LAB — TROPONIN I
Troponin I: 0.63 ng/mL (ref <0.03)
Troponin I: 0.6 ng/mL (ref <0.03)
Troponin I: 0.57 ng/mL (ref <0.03)

## 2018-09-03 LAB — BASIC METABOLIC PANEL
Anion gap: 10 (ref 5–15)
BUN: 9 mg/dL (ref 8–23)
CHLORIDE: 102 mmol/L (ref 98–111)
CO2: 24 mmol/L (ref 22–32)
CREATININE: 1.03 mg/dL (ref 0.61–1.24)
Calcium: 9.4 mg/dL (ref 8.9–10.3)
GFR calc Af Amer: >60 mL/min (ref >60)
GFR calc non Af Amer: >60 mL/min (ref >60)
Glucose, Bld: 98 mg/dL (ref 70–99)
Potassium: 3.4 mmol/L — ABNORMAL LOW (ref 3.5–5.1)
Sodium: 136 mmol/L (ref 135–145)

## 2018-09-03 LAB — CBC
HCT: 39.3 % (ref 39.0–52.0)
Hemoglobin: 13 g/dL (ref 13.0–17.0)
MCH: 35.2 pg — ABNORMAL HIGH (ref 26.0–34.0)
MCHC: 33.1 g/dL (ref 30.0–36.0)
MCV: 106.5 fL — ABNORMAL HIGH (ref 80.0–100.0)
Platelets: 199 K/uL (ref 150–400)
RBC: 3.69 MIL/uL — ABNORMAL LOW (ref 4.22–5.81)
RDW: 13.2 % (ref 11.5–15.5)
WBC: 8.5 K/uL (ref 4.0–10.5)
nRBC: 0 % (ref 0.0–0.2)

## 2018-09-03 LAB — ECHOCARDIOGRAM COMPLETE

## 2018-09-03 SURGERY — LEFT HEART CATH AND CORONARY ANGIOGRAPHY
Anesthesia: LOCAL

## 2018-09-03 MED ORDER — SODIUM CHLORIDE 0.9 % WEIGHT BASED INFUSION: 1.0000 mL/kg/h | INTRAVENOUS | Status: DC

## 2018-09-03 MED ORDER — ACETAMINOPHEN 325 MG PO TABS: 650.0000 mg | ORAL_TABLET | ORAL | Status: DC | PRN

## 2018-09-03 MED ORDER — MIDAZOLAM HCL 2 MG/2ML IJ SOLN
INTRAMUSCULAR | Status: AC
  Filled 2018-09-03: qty 2

## 2018-09-03 MED ORDER — SODIUM CHLORIDE 0.9% FLUSH: 3.0000 mL | INTRAVENOUS | Status: DC | PRN

## 2018-09-03 MED ORDER — HEPARIN (PORCINE) IN NACL 100-0.45 UNIT/ML-% IJ SOLN
1100.0000 [IU]/h | INTRAMUSCULAR | Status: DC
  Administered 2018-09-04: 650 [IU]/h via INTRAVENOUS
  Administered 2018-09-05: 1100 [IU]/h via INTRAVENOUS
  Filled 2018-09-03 (×2): qty 250

## 2018-09-03 MED ORDER — SODIUM CHLORIDE 0.9% FLUSH
3.0000 mL | Freq: Two times a day (BID) | INTRAVENOUS | Status: DC
  Administered 2018-09-04: 3 mL via INTRAVENOUS

## 2018-09-03 MED ORDER — VERAPAMIL HCL 2.5 MG/ML IV SOLN
INTRAVENOUS | Status: AC
  Filled 2018-09-03: qty 2

## 2018-09-03 MED ORDER — MORPHINE SULFATE (PF) 4 MG/ML IV SOLN
4.0000 mg | Freq: Once | INTRAVENOUS | Status: AC
  Administered 2018-09-03: 4 mg via INTRAVENOUS
  Filled 2018-09-03: qty 1

## 2018-09-03 MED ORDER — NITROGLYCERIN 0.4 MG SL SUBL: 0.4000 mg | SUBLINGUAL_TABLET | SUBLINGUAL | Status: DC | PRN

## 2018-09-03 MED ORDER — ASPIRIN EC 81 MG PO TBEC
81.0000 mg | DELAYED_RELEASE_TABLET | Freq: Every day | ORAL | Status: DC
  Administered 2018-09-03 – 2018-09-04 (×2): 81 mg via ORAL
  Filled 2018-09-03 (×2): qty 1

## 2018-09-03 MED ORDER — SODIUM CHLORIDE 0.9 % IV SOLN
INTRAVENOUS | Status: DC | PRN
  Administered 2018-09-03: 500 mL via INTRAVENOUS

## 2018-09-03 MED ORDER — NICOTINE 14 MG/24HR TD PT24
14.0000 mg | MEDICATED_PATCH | Freq: Every day | TRANSDERMAL | Status: DC
  Administered 2018-09-03 – 2018-09-04 (×2): 14 mg via TRANSDERMAL
  Filled 2018-09-03 (×2): qty 1

## 2018-09-03 MED ORDER — METOPROLOL TARTRATE 12.5 MG HALF TABLET
12.5000 mg | ORAL_TABLET | Freq: Two times a day (BID) | ORAL | Status: DC
  Administered 2018-09-03 – 2018-09-04 (×3): 12.5 mg via ORAL
  Filled 2018-09-03 (×4): qty 1

## 2018-09-03 MED ORDER — LIDOCAINE HCL (PF) 1 % IJ SOLN
INTRAMUSCULAR | Status: AC
  Filled 2018-09-03: qty 30

## 2018-09-03 MED ORDER — HEPARIN (PORCINE) IN NACL 1000-0.9 UT/500ML-% IV SOLN
INTRAVENOUS | Status: DC | PRN
  Administered 2018-09-03 (×2): 500 mL

## 2018-09-03 MED ORDER — SODIUM CHLORIDE 0.9% FLUSH: 3.0000 mL | Freq: Two times a day (BID) | INTRAVENOUS | Status: DC

## 2018-09-03 MED ORDER — MIDAZOLAM HCL 2 MG/2ML IJ SOLN
INTRAMUSCULAR | Status: DC | PRN
  Administered 2018-09-03: 1 mg via INTRAVENOUS

## 2018-09-03 MED ORDER — VERAPAMIL HCL 2.5 MG/ML IV SOLN
INTRAVENOUS | Status: DC | PRN
  Administered 2018-09-03: 10 mL via INTRA_ARTERIAL

## 2018-09-03 MED ORDER — SODIUM CHLORIDE 0.9 % IV SOLN: 250.0000 mL | INTRAVENOUS | Status: DC | PRN

## 2018-09-03 MED ORDER — HEPARIN SODIUM (PORCINE) 1000 UNIT/ML IJ SOLN
INTRAMUSCULAR | Status: DC | PRN
  Administered 2018-09-03: 2500 [IU] via INTRAVENOUS

## 2018-09-03 MED ORDER — ASPIRIN EC 81 MG PO TBEC: 81.0000 mg | DELAYED_RELEASE_TABLET | Freq: Every day | ORAL | Status: DC

## 2018-09-03 MED ORDER — POTASSIUM CHLORIDE CRYS ER 20 MEQ PO TBCR
40.0000 meq | EXTENDED_RELEASE_TABLET | Freq: Once | ORAL | Status: AC
  Administered 2018-09-03: 40 meq via ORAL
  Filled 2018-09-03: qty 2

## 2018-09-03 MED ORDER — ONDANSETRON HCL 4 MG/2ML IJ SOLN: 4.0000 mg | Freq: Four times a day (QID) | INTRAMUSCULAR | Status: DC | PRN

## 2018-09-03 MED ORDER — ATORVASTATIN CALCIUM 80 MG PO TABS
80.0000 mg | ORAL_TABLET | Freq: Every day | ORAL | Status: DC
  Administered 2018-09-03 – 2018-09-11 (×8): 80 mg via ORAL
  Filled 2018-09-03 (×7): qty 1

## 2018-09-03 MED ORDER — FENTANYL CITRATE (PF) 100 MCG/2ML IJ SOLN
INTRAMUSCULAR | Status: DC | PRN
  Administered 2018-09-03: 25 ug via INTRAVENOUS

## 2018-09-03 MED ORDER — FENTANYL CITRATE (PF) 100 MCG/2ML IJ SOLN
INTRAMUSCULAR | Status: AC
  Filled 2018-09-03: qty 2

## 2018-09-03 MED ORDER — HEPARIN BOLUS VIA INFUSION
3000.0000 [IU] | Freq: Once | INTRAVENOUS | Status: AC
  Administered 2018-09-03: 3000 [IU] via INTRAVENOUS
  Filled 2018-09-03: qty 3000

## 2018-09-03 MED ORDER — LIDOCAINE HCL (PF) 1 % IJ SOLN
INTRAMUSCULAR | Status: DC | PRN
  Administered 2018-09-03: 2 mL

## 2018-09-03 MED ORDER — SODIUM CHLORIDE 0.9 % WEIGHT BASED INFUSION: 3.0000 mL/kg/h | INTRAVENOUS | Status: DC

## 2018-09-03 MED ORDER — HEPARIN (PORCINE) IN NACL 100-0.45 UNIT/ML-% IJ SOLN
700.0000 [IU]/h | INTRAMUSCULAR | Status: DC
  Administered 2018-09-03: 700 [IU]/h via INTRAVENOUS
  Filled 2018-09-03: qty 250

## 2018-09-03 MED ORDER — HEPARIN (PORCINE) IN NACL 1000-0.9 UT/500ML-% IV SOLN
INTRAVENOUS | Status: AC
  Filled 2018-09-03: qty 1000

## 2018-09-03 MED ORDER — SODIUM CHLORIDE 0.9 % IV SOLN
INTRAVENOUS | Status: AC
  Administered 2018-09-03: 18:00:00 via INTRAVENOUS

## 2018-09-03 SURGICAL SUPPLY — 11 items
CATH INFINITI 5FR JK (CATHETERS) ×1 IMPLANT
DEVICE RAD COMP TR BAND LRG (VASCULAR PRODUCTS) ×1 IMPLANT
GLIDESHEATH SLEND SS 6F .021 (SHEATH) ×1 IMPLANT
GUIDEWIRE INQWIRE 1.5J.035X260 (WIRE) IMPLANT
INQWIRE 1.5J .035X260CM (WIRE) ×2
KIT HEART LEFT (KITS) ×2 IMPLANT
PACK CARDIAC CATHETERIZATION (CUSTOM PROCEDURE TRAY) ×2 IMPLANT
TRANSDUCER W/STOPCOCK (MISCELLANEOUS) ×2 IMPLANT
TUBING CIL FLEX 10 FLL-RA (TUBING) ×2 IMPLANT
WIRE HI TORQ VERSACORE-J 145CM (WIRE) ×2 IMPLANT
WIRE RUNTHROUGH .014X180CM (WIRE) ×1 IMPLANT

## 2018-09-03 NOTE — Consult Note (Signed)
   [facility] CM Inpatient Consult   09/03/2018  Tad Edmond 04-Sep-1946 [id number]   Referral received by inpatient RNCM for [facility] Care Management services.  Went to speak with patient about [facility] Care Management. However, he was off the unit.  Spoke with inpatient RNCM about attempted beside visit.  Raiford Noble, MSN-Ed, RN,BSN [facility]-Main Liaison [phone]

## 2018-09-03 NOTE — Progress Notes (Signed)
ANTICOAGULATION CONSULT NOTE - Initial Consult  Pharmacy Consult for Heparin Indication: chest pain/ACS  No Known Allergies  Patient Measurements:    Vital Signs: BP: 138/89 (10/30 0545) Pulse Rate: 41 (10/30 0545)  Labs: Recent Labs    09/03/18 0300  HGB 13.0  HCT 39.3  PLT 199  CREATININE 1.03    CrCl cannot be calculated (Unknown ideal weight.).   Medical History: Past Medical History:  Diagnosis Date  . Bladder calculi   . COPD (chronic obstructive pulmonary disease) (HCC)   . DOE (dyspnea on exertion)   . Foley catheter in place   . History of bladder stone   . 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 History of urinary retention   . Hypertension   . Mobitz type 1 second degree atrioventricular block   . Osteoarthritis   . Paroxysmal atrial tachycardia (HCC)   . Peripheral vascular disease (HCC)    last duplex 09-25-2016 bilateral ABI 0.6 and occulsion of  right popiteal posterior tibials and left peroneal  . Wears glasses     Medications:  No current facility-administered medications on file prior to encounter.    Current Outpatient Medications on File Prior to Encounter  Medication Sig Dispense Refill  . amLODipine (NORVASC) 10 MG tablet Take 10 mg by mouth every morning.     Marland Kitchen aspirin EC 81 MG tablet Take 1 tablet (81 mg total) by mouth daily. (Patient not taking: Reported on 09/03/2018) 90 tablet 3  . atorvastatin (LIPITOR) 40 MG tablet Take 1 tablet (40 mg total) by mouth daily at 6 PM. (Patient not taking: Reported on 09/03/2018) 90 tablet 3     Assessment: 72 y.o. male with NSTEMI for heparin Goal of Therapy:  Heparin level 0.3-0.7 units/ml Monitor platelets by anticoagulation protocol: Yes   Plan:  Heparin 3000 units IV bolus, then start heparin 700 units/hr Check heparin level in 6 hours.   Joe Durham, Gary Fleet 09/03/2018,5:52 AM

## 2018-09-03 NOTE — Plan of Care (Signed)
  Problem: Education: Goal: Knowledge of General Education information will improve Description Including pain rating scale, medication(s)/side effects and non-pharmacologic comfort measures Outcome: Progressing   Problem: Activity: Goal: Risk for activity intolerance will decrease Outcome: Progressing   Problem: Clinical Measurements: Goal: Respiratory complications will improve Outcome: Completed/Met

## 2018-09-03 NOTE — Progress Notes (Signed)
ANTICOAGULATION CONSULT NOTE - Follow Up Consult  Pharmacy Consult for Heparin Indication: chest pain/ACS 3V CAD plan CABG  No Known Allergies  Patient Measurements: Height: 5\' 8"  (172.7 cm) Weight: 113 lb 8.6 oz (51.5 kg) IBW/kg (Calculated) : 68.4  Vital Signs: Temp: 97.6 F (36.4 C) (10/30 0911) Temp Source: Oral (10/30 0911) BP: 133/80 (10/30 1529) Pulse Rate: 65 (10/30 1529)  Labs: Recent Labs    09/03/18 0300 09/03/18 0838 09/03/18 1044  HGB 13.0  --   --   HCT 39.3  --   --   PLT 199  --   --   CREATININE 1.03  --   --   TROPONINI  --  0.63* 0.60*    Estimated Creatinine Clearance: 48.6 mL/min (by C-G formula based on SCr of 1.03 mg/dL).   Medical History: Past Medical History:  Diagnosis Date  . Bladder calculi   . COPD (chronic obstructive pulmonary disease) (HCC)   . DOE (dyspnea on exertion)   . Foley catheter in place   . History of bladder stone   . 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 History of urinary retention   . Hypertension   . Mobitz type 1 second degree atrioventricular block   . Osteoarthritis   . Paroxysmal atrial tachycardia (HCC)   . Peripheral vascular disease (HCC)    last duplex 09-25-2016 bilateral ABI 0.6 and occulsion of  right popiteal posterior tibials and left peroneal  . Wears glasses     Medications:  No current facility-administered medications on file prior to encounter.    Current Outpatient Medications on File Prior to Encounter  Medication Sig Dispense Refill  . amLODipine (NORVASC) 10 MG tablet Take 10 mg by mouth every morning.     Marland Kitchen aspirin EC 81 MG tablet Take 1 tablet (81 mg total) by mouth daily. (Patient not taking: Reported on 09/03/2018) 90 tablet 3  . atorvastatin (LIPITOR) 40 MG tablet Take 1 tablet (40 mg total) by mouth daily at 6 PM. (Patient not taking: Reported on 09/03/2018) 90 tablet  3     Assessment: 72 y.o. male with NSTEMI went to cath lab today has 3V CAD> plan for CABG Plan to restart heparin drip 8hr after sheath out - out 1600 start at [place].  Goal of Therapy:  Heparin level 0.3-0.7 units/ml Monitor platelets by anticoagulation protocol: Yes   Plan:  Heparin drip 650 uts/hr - start at [place] Daily HL, CBC  Leota Sauers Pharm.D. CPP, BCPS Clinical Pharmacist [phone] 09/03/2018 5:02 PM

## 2018-09-03 NOTE — H&P (Signed)
Cardiology Admission History and Physical:   Patient ID: Joe Durham; MRN: [id number]; DOB: 08-07-1946   Admission date: 09/03/2018  Primary Care Provider:  Patient, No Pcp Per Primary Cardiologist:  Thurmon Fair   Chief Complaint:  Chest pain  History of Present Illness:   Joe Durham is a 72 y.o. 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  The patient presents to the hospital with complaints of pain on the left side of his chest radiating to the left arm.  In the past he has experienced sharp chest pain lasting only 3 to 5 seconds.  However one day ago he had severe chest pain that lasted greater than 30 minutes.  He also vomited twice and had nausea along with diaphoresis.  EMS gave him 2 doses of nitroglycerin with some improvement in his chest pain.  He also received 4 tablets of aspirin.  In the emergency department he was given morphine with complete resolution of his chest discomfort.  The ECG revealed sinus rhythm with a rate of 69 bpm, frequent PACs, poor R wave progression in the anterior precordial leads, and T wave inversions in the lateral leads. The troponin I was 0.72.  At home he is not very active.  His ambulation is limited by right leg claudication.  He denies any orthopnea, paroxysmal nocturnal dyspnea or lower extremity edema.  He is still actively smoking.  He does not take any medications at home.   Previous cardiac work-up Echocardiogram-01/05/2015 - Left ventricle: The cavity size was normal. Wall thickness was increased increased in a pattern of mild to moderate LVH. The estimated ejection fraction was 60%. Wall motion was normal; there were no regional wall motion abnormalities. - Aortic valve: Sclerosis without stenosis.  There was no significant regurgitation. - Mitral valve: There was mild regurgitation. - Left atrium: The atrium was normal in size. - Right ventricle: The cavity size was normal. Systolic function was mildly reduced.  Transesophageal echo- 01/05/2015 - Left ventricle: Systolic function was normal. The estimated ejection fraction was in the range of 60% to 65%. Wall motion was normal; there were no regional wall motion abnormalities. - Left atrium: No evidence of thrombus in the atrial cavity or appendage. - Right atrium: No evidence of thrombus in the atrial cavity or appendage. - Atrial septum: Echo contrast study showed no right-to-left atrial level shunt, at baseline or with provocation.     Past Medical History:  Diagnosis Date  . Bladder calculi   . COPD (chronic obstructive pulmonary disease) (HCC)   . DOE (dyspnea on exertion)   . Foley catheter in place   . History of bladder stone   . 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 History of urinary retention   . Hypertension   . Mobitz type 1 second degree atrioventricular block   . Osteoarthritis   . Paroxysmal atrial tachycardia (HCC)   . Peripheral vascular disease (HCC)    last duplex 09-25-2016 bilateral ABI 0.6 and occulsion of  right popiteal posterior tibials and left peroneal  . Wears glasses     Past Surgical History:  Procedure Laterality Date  . CYSTOSCOPY WITH LITHOLAPAXY N/A 07/15/2017   Procedure: OPEN CYSTOLITHOTOMY;  Surgeon: Ray Church III,  MD;  Location: [facility];  Service: Urology;  Laterality: N/A;  . INGUINAL HERNIA REPAIR Right 2011  . INSERTION OF SUPRAPUBIC CATHETER N/A 07/15/2017   Procedure: INSERTION OF SUPRAPUBIC CATHETER;  Surgeon: Crista Elliot, MD;  Location: [facility];  Service: Urology;  Laterality: N/A;  . LOOP RECORDER IMPLANT N/A 01/05/2015   Procedure: LOOP  RECORDER IMPLANT;  Surgeon: Thurmon Fair, MD;  Location: [facility];  Service: Cardiovascular;  Laterality: N/A;  . ORIF WRIST FRACTURE Left 03/20/2014   Procedure: OPEN REDUCTION INTERNAL FIXATION (ORIF) WRIST FRACTURE;  Surgeon: Dominica Severin, MD;  Location: [facility];  Service: Orthopedics;  Laterality: Left;  . TEE WITHOUT CARDIOVERSION N/A 01/05/2015   Procedure: TRANSESOPHAGEAL ECHOCARDIOGRAM (TEE)/LOOP;  Surgeon: Thurmon Fair, MD;  Location: [facility];  Service: Cardiovascular;  Laterality: N/A; ef 60-65%, no LA or RA thrombus, trivial PR, mild TR  . TONSILLECTOMY  child  . TOTAL HIP ARTHROPLASTY Right 2000     Medications Prior to Admission: Prior to Admission medications   Medication Sig Start Date End Date Taking? Authorizing Provider  amLODipine (NORVASC) 10 MG tablet Take 10 mg by mouth every morning.     [provider]  aspirin EC 81 MG tablet Take 1 tablet (81 mg total) by mouth daily. Patient not taking: Reported on 09/03/2018 07/30/17   Croitoru, Rachelle Hora, MD  atorvastatin (LIPITOR) 40 MG tablet Take 1 tablet (40 mg total) by mouth daily at 6 PM. Patient not taking: Reported on 09/03/2018 01/23/18   Croitoru, Rachelle Hora, MD     Allergies:   No Known Allergies  Social History:   Social History   Socioeconomic History  . Marital status: Single    Spouse name: Not on file  . Number of children: 4  . Years of education: 9  . Highest education level: Not on file  Occupational History  . Occupation: retired    Comment: Training and development officer  . Financial resource strain: Not on file  . Food insecurity:    Worry: Not on file    Inability: Not on file  . Transportation needs:    Medical: Not on file    Non-medical: Not on file  Tobacco Use  . Smoking status: Current Every Day Smoker    Years: 52.00    Types: Cigarettes  . Smokeless tobacco: Never Used  . Tobacco comment: 07-08-2018 pt is down to 1ppw from 1 ppd  Substance and Sexual Activity  . Alcohol  use: Yes    Alcohol/week: 0.0 standard drinks    Comment: 07-08-2018 per pt 1 pint and half per week  . Drug use: No  . Sexual activity: Not on file  Lifestyle  . Physical activity:    Days per week: Not on file    Minutes per session: Not on file  . Stress: Not on file  Relationships  . Social connections:    Talks on phone: Not on file    Gets together: Not on file    Attends religious service: Not on file    Active member of club or organization: Not on file    Attends meetings of clubs or organizations: Not on file    Relationship status: Not on file  . Intimate partner violence:    Fear of current or ex partner: Not on file    Emotionally abused: Not on file    Physically abused: Not on file    Forced sexual activity: Not on file  Other Topics Concern  .  Not on file  Social History Narrative   Single   Right handed   Caffeine use - sodas on weekends with alcohol     Family History:  The patient's family history includes Stroke in his mother and sister.     Review of Systems: [y] = yes, [ ]  = no   . General: Weight gain [ ] ; Weight loss [ ] ; Anorexia [ ] ; Fatigue [ ] ; Fever [ ] ; Chills [ ] ; Weakness [ ]   . Cardiac: Chest pain/pressure Cove.Etienne ]; Resting SOB [ ] ; Exertional SOB [ ] ; Orthopnea [ ] ; Pedal Edema [ ] ; Palpitations [ ] ; Syncope [ ] ; Presyncope [ ] ; Paroxysmal nocturnal dyspnea[ ]   . Pulmonary: Cough [ ] ; Wheezing[ ] ; Hemoptysis[ ] ; Sputum [ ] ; Snoring [ ]   . GI: Vomiting[y ]; Dysphagia[ ] ; Melena[ ] ; Hematochezia [ ] ; Heartburn[ ] ; Abdominal pain [ ] ; Constipation [ ] ; Diarrhea [ ] ; BRBPR [ ]   . GU: Hematuria[ ] ; Dysuria [ ] ; Nocturia[ ]   . Vascular: Pain in legs with walking Cove.Etienne ]; Pain in feet with lying flat [ ] ; Non-healing sores [ ] ; Stroke [ ] ; TIA [ ] ; Slurred speech [ ] ;  . Neuro: Headaches[ ] ; Vertigo[ ] ; Seizures[ ] ; Paresthesias[ ] ;Blurred vision [ ] ; Diplopia [ ] ; Vision changes [ ]   . Ortho/Skin: Arthritis [ ] ; Joint pain [ ] ; Muscle pain [ ] ; Joint  swelling [ ] ; Back Pain [ ] ; Rash [ ]   . Psych: Depression[ ] ; Anxiety[ ]   . Heme: Bleeding problems [ ] ; Clotting disorders [ ] ; Anemia [ ]   . Endocrine: Diabetes [ ] ; Thyroid dysfunction[ ]      Physical Exam/Data:   Vitals:   09/03/18 0302 09/03/18 0315 09/03/18 0330 09/03/18 0345  BP:  121/89 129/74 124/84  Pulse: 88  87 89  Resp: 16  19 15   SpO2: 99% 99% 99% 100%   No intake or output data in the 24 hours ending 09/03/18 0402 There were no vitals filed for this visit. There is no height or weight on file to calculate BMI.  General:  Well nourished, well developed, in no acute distress HEENT: normal Lymph: no adenopathy Neck: no JVD Endocrine:  No thryomegaly Vascular: No carotid bruits; FA pulses 2+ bilaterally without bruits  Cardiac:  normal S1, S2; RRR; no murmur  Lungs:  clear to auscultation bilaterally, no wheezing, rhonchi or rales  Abd: soft, nontender, no hepatomegaly  Ext: no edema Musculoskeletal:  No deformities, BUE and BLE strength normal and equal Skin: warm and dry  Neuro:  CNs 2-12 intact, no focal abnormalities noted Psych:  Normal affect    EKG:  The ECG that was done on 09/03/18 and was personally reviewed demonstrated sinus rhythm with a rate of 69 bpm, frequent PACs, poor R wave progression in the anterior precordial leads, T wave inversions in the lateral leads    Laboratory Data:  Chemistry Recent Labs  Lab 09/03/18 0300  NA 136  K 3.4*  CL 102  CO2 24  GLUCOSE 98  BUN 9  CREATININE 1.03  CALCIUM 9.4  GFRNONAA >60  GFRAA >60  ANIONGAP 10    No results for input(s): PROT, ALBUMIN, AST, ALT, ALKPHOS, BILITOT in the last 168 hours. Hematology Recent Labs  Lab 09/03/18 0300  WBC 8.5  RBC 3.69*  HGB 13.0  HCT 39.3  MCV 106.5*  MCH 35.2*  MCHC 33.1  RDW 13.2  PLT 199   Cardiac EnzymesNo results for input(s): TROPONINI in the  last 168 hours.  Recent Labs  Lab 09/03/18 0303  TROPIPOC 0.72*    BNPNo results for input(s):  BNP, PROBNP in the last 168 hours.  DDimer No results for input(s): DDIMER in the last 168 hours.  Radiology/Studies:  No results found.  Assessment and Plan:   1. Non-ST elevation myocardial infarction  - Trend cardiac biomarkers and obtain serial ECGs - Continue/start aspirin 81 mg daily - Continue/start high dose statins (such as Atorvastatin 80 mg nightly). Check a lipid panel in the morning. - Start intravenous unfractionated heparin per pharmacy protocol - Obtain a transthoracic echocardiogram to evaluate LV systolic and diastolic function. -  Recommend cardiac catheterization to define the coronary anatomy. -  Please make the patient NPO (except medications) for possible cardiac catheterization in the morning.   Severity of Illness: The appropriate patient status for this patient is INPATIENT. Inpatient status is judged to be reasonable and necessary in order to provide the required intensity of service to ensure the patient's safety. The patient's presenting symptoms, physical exam findings, and initial radiographic and laboratory data in the context of their chronic comorbidities is felt to place them at high risk for further clinical deterioration. Furthermore, it is not anticipated that the patient will be medically stable for discharge from the hospital within 2 midnights of admission. The following factors support the patient status of inpatient.   " The patient's presenting symptoms include chest pain. " The worrisome physical exam findings include none. " The initial radiographic and laboratory data are worrisome because of elevated troponin. " The chronic co-morbidities include peripheral arterial disease.   * I certify that at the point of admission it is my clinical judgment that the patient will require inpatient hospital care spanning beyond 2 midnights from the point of admission due to high intensity of service, high risk for further deterioration and high frequency  of surveillance required.*    For questions or updates, please contact [facility] Please consult [email or web address] for contact info under Cardiology/STEMI.    Signed, Lonie Peak, MD  09/03/2018 4:02 AM

## 2018-09-03 NOTE — Progress Notes (Signed)
Progress Note  Patient Name: Joe Durham Date of Encounter: 09/03/2018  Primary Cardiologist: Thurmon Fair, MD   Subjective   Patient states his chest pain has greatly improved with Nitroglycerin and Morphine but he still reports a little discomfort. He also feels a little short of breath at this time.   Inpatient Medications    Scheduled Meds: . [START ON 09/04/2018] aspirin EC  81 mg Oral Daily  . atorvastatin  80 mg Oral q1800  . metoprolol tartrate  12.5 mg Oral BID   Continuous Infusions: . heparin 700 Units/hr (09/03/18 0742)   PRN Meds: acetaminophen, nitroGLYCERIN, ondansetron (ZOFRAN) IV   Vital Signs    Vitals:   09/03/18 0715 09/03/18 0730 09/03/18 0800 09/03/18 0815  BP: 140/73 105/74 134/90 140/64  Pulse:   90 63  Resp:   19 15  SpO2: 98% 97% 100% 99%   No intake or output data in the 24 hours ending 09/03/18 0836 There were no vitals filed for this visit.  Telemetry    Sinus rhythm with Mobitz type 1 AV block  - Personally Reviewed  Physical Exam   GEN: Thin 72 year old African-American male resting comfortably in no acute distress.   Neck: Supple. Cardiac: Irregular rhythm with normal rate. No murmurs, rubs, or gallops.  Respiratory: Slightly diminished breath sounds but clear to auscultation bilaterally. GI: Soft, non-distended, and mildly tender to palpation.  MS: No lower extremity edema. Neuro:  No focal deficits.  Psych: Normal affect.  Labs    Chemistry Recent Labs  Lab 09/03/18 0300  NA 136  K 3.4*  CL 102  CO2 24  GLUCOSE 98  BUN 9  CREATININE 1.03  CALCIUM 9.4  GFRNONAA >60  GFRAA >60  ANIONGAP 10     Hematology Recent Labs  Lab 09/03/18 0300  WBC 8.5  RBC 3.69*  HGB 13.0  HCT 39.3  MCV 106.5*  MCH 35.2*  MCHC 33.1  RDW 13.2  PLT 199    Cardiac EnzymesNo results for input(s): TROPONINI in the last 168 hours.  Recent Labs  Lab 09/03/18 0303  TROPIPOC 0.72*     BNPNo results for input(s): BNP,  PROBNP in the last 168 hours.   DDimer No results for input(s): DDIMER in the last 168 hours.   Radiology    Dg Chest 2 View  Result Date: 09/03/2018 CLINICAL DATA:  Initial evaluation for acute mid chest pain. EXAM: CHEST - 2 VIEW COMPARISON:  Prior CT from 01/05/2015. FINDINGS: Transverse heart size within normal limits. Prominence of the ascending aortic margin, which could reflect aneurysm. Mediastinal silhouette otherwise within normal limits. Lungs hyperinflated. No focal infiltrates. No pulmonary edema or pleural effusion. No pneumothorax. No acute osseous abnormality. IMPRESSION: 1. Hyperinflation with no other active cardiopulmonary disease identified. 2. Prominence of the ascending aortic contour. While this may be related to tortuosity, possible ascending aortic aneurysm could also be considered. Consider follow-up with CTA or MRA for further evaluation. Electronically Signed   By: Rise Mu M.D.   On: 09/03/2018 04:22    Cardiac Studies   Echo pending.  Patient Profile     72 y.o. 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   Assessment & Plan    1. Non-STEMI - Patient presented  to the ED for evaluation of left sided chest pain with radiation to left arm and associated nausea, vomiting, and diaphoresis. Pain improved some with Nitroglycerin. - EKG showed some T wave inversions in interior and lateral leads.  - I-stat troponin elevated at 0.72. Trending troponins.  - Echo pending. - Patient started on Heparin in the ED. - Continue Aspirin, beta-blocker, and high intensity statin.  - Will discuss proceeding with cardiac catheterization with MD.   2. Hypertension - Patient on Amlodipine 10mg  daily at home. - Patient has  been started on Lopressor 12.5mg  twice daily given NSTEMI. May need to add Amlodipine back pending BP.   3. Hyperlipidemia  - Continue home Lipitor 40mg  daily.  4. Paroxysmal Atrial Tachycardia and Mobitz Type 1 Second Degree Heart Block - Loop recorder placed in 2016 after patient had embolic stroke. Loop recorder revealed paroxysmal atrial tachycardia and Mobitz type 1 second degree heart block.  - Telemetry currently shows sinus rhythm with Mobitz type 1 second degree heart block.  For questions or updates, please contact [facility] Please consult [email or web address] for contact info under        Signed, Corrin Parker, PA-C  09/03/2018, 8:36 AM

## 2018-09-03 NOTE — Progress Notes (Signed)
Pt has suprapubic cath in place.  Site without redness.  Catheter is dark, will ask ostomy nurse to eva.  Also with tobacco use, Nicoderm patch added.

## 2018-09-03 NOTE — ED Triage Notes (Signed)
Pt here by EMS for central chest pain that radiates to the left shoulder and arm.  States pain has been there for the last 2 hours.  Nitro given by EMS pain dramatically improved.  Hx of stroke, HTN, and CAD.  A&Ox4.

## 2018-09-03 NOTE — ED Notes (Signed)
Cardiology PA at bedside. 

## 2018-09-03 NOTE — ED Provider Notes (Addendum)
[name] Provider Note   CSN: [id number] Arrival date & time: 09/03/18  0240     History   Chief Complaint Chief Complaint  Patient presents with  . Chest Pain    HPI Joe Durham is a 72 y.o. male.  Patient is a 72 year old male with past medical history of COPD, peripheral vascular disease, Mobitz 1 heart block.  He presents today for evaluation of chest discomfort.  He reports a 1-1/2-day history of sharp pain in the right side of his chest that radiates to his axilla.  This began in the absence of any injury or trauma.  He denies any shortness of breath but does state that his pain is worse when he breathes.  He also reports a worsening when he changes position and lies on his right side.  The history is provided by the patient.  Chest Pain   This is a new problem. The problem occurs constantly. The problem has been gradually worsening. The pain is present in the substernal region and lateral region. The pain is moderate. The quality of the pain is described as sharp. The pain radiates to the right shoulder. The symptoms are aggravated by deep breathing and certain positions. Pertinent negatives include no diaphoresis, no dizziness, no nausea and no shortness of breath. He has tried nothing for the symptoms.    Past Medical History:  Diagnosis Date  . Bladder calculi   . COPD (chronic obstructive pulmonary disease) (HCC)   . DOE (dyspnea on exertion)   . Foley catheter in place   . History of bladder stone   . 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 History of urinary retention   . Hypertension   . Mobitz type 1 second degree atrioventricular block   . Osteoarthritis   . Paroxysmal atrial tachycardia (HCC)   . Peripheral vascular disease (HCC)    last duplex 09-25-2016 bilateral ABI 0.6 and occulsion of  right  popiteal posterior tibials and left peroneal  . Wears glasses     Patient Active Problem List   Diagnosis Date Noted  . History of loop recorder 01/23/2018  . Mobitz type 1 second degree AV block 07/30/2017  . PAD (peripheral artery disease) (HCC) 07/30/2017  . Bladder calculi 07/15/2017  . UTI (urinary tract infection) 06/13/2017  . Acute cystitis with hematuria   . Gross hematuria 06/12/2017  . Renal insufficiency 06/12/2017  . Complicated UTI (urinary tract infection) 06/12/2017  . Acute pyelonephritis 06/12/2017  . Uncomplicated alcohol dependence (HCC)   . Acute kidney injury (HCC)   . Claudication (HCC) 11/14/2016  . Encounter for loop recorder check 07/19/2015  . First degree AV block 07/19/2015  . PAT (paroxysmal atrial tachycardia) (HCC) 07/19/2015  . Essential hypertension 04/05/2015  . Tobacco use disorder 04/05/2015  . Cocaine abuse (HCC) 04/05/2015  . Hospital discharge follow-up 01/10/2015  . 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 Lung mass   . Acute embolic stroke (HCC) 01/04/2015  . Cerebral infarction due to unspecified mechanism   . Visual changes   . Substance abuse (HCC)   . Hyperlipidemia   . CVA (cerebral infarction) 01/03/2015  . Tobacco abuse 04/12/2014  . Bladder stones 04/12/2014    Past Surgical History:  Procedure Laterality Date  . CYSTOSCOPY WITH LITHOLAPAXY N/A 07/15/2017   Procedure: OPEN CYSTOLITHOTOMY;  Surgeon: Crista Elliot, MD;  Location: [facility] ORS;  Service: Urology;  Laterality: N/A;  . INGUINAL HERNIA REPAIR Right 2011  . INSERTION OF SUPRAPUBIC CATHETER N/A 07/15/2017   Procedure: INSERTION OF SUPRAPUBIC CATHETER;  Surgeon: Crista Elliot, MD;  Location: [facility] ORS;  Service: Urology;  Laterality: N/A;  . LOOP RECORDER IMPLANT N/A 01/05/2015   Procedure: LOOP RECORDER IMPLANT;  Surgeon: Thurmon Fair, MD;  Location: [facility] CATH LAB;  Service: Cardiovascular;  Laterality: N/A;  . ORIF WRIST  FRACTURE Left 03/20/2014   Procedure: OPEN REDUCTION INTERNAL FIXATION (ORIF) WRIST FRACTURE;  Surgeon: Dominica Severin, MD;  Location: [facility] OR;  Service: Orthopedics;  Laterality: Left;  . TEE WITHOUT CARDIOVERSION N/A 01/05/2015   Procedure: TRANSESOPHAGEAL ECHOCARDIOGRAM (TEE)/LOOP;  Surgeon: Thurmon Fair, MD;  Location: [facility] ENDOSCOPY;  Service: Cardiovascular;  Laterality: N/A; ef 60-65%, no LA or RA thrombus, trivial PR, mild TR  . TONSILLECTOMY  child  . TOTAL HIP ARTHROPLASTY Right 2000        Home Medications    Prior to Admission medications   Medication Sig Start Date End Date Taking? Authorizing Provider  amLODipine (NORVASC) 10 MG tablet Take 10 mg by mouth every morning.     [provider]  aspirin EC 81 MG tablet Take 1 tablet (81 mg total) by mouth daily. Patient not taking: Reported on 09/03/2018 07/30/17   Croitoru, Rachelle Hora, MD  atorvastatin (LIPITOR) 40 MG tablet Take 1 tablet (40 mg total) by mouth daily at 6 PM. Patient not taking: Reported on 07/01/2018 01/23/18   Croitoru, Rachelle Hora, MD    Family History Family History  Problem Relation Age of Onset  . Stroke Mother   . Stroke Sister     Social History Social History   Tobacco Use  . Smoking status: Current Every Day Smoker    Years: 52.00    Types: Cigarettes  . Smokeless tobacco: Never Used  . Tobacco comment: 07-08-2018 pt is down to 1ppw from 1 ppd  Substance Use Topics  . Alcohol use: Yes    Alcohol/week: 0.0 standard drinks    Comment: 07-08-2018 per pt 1 pint and half per week  . Drug use: No     Allergies   Patient has no known allergies.   Review of Systems Review of Systems  Constitutional: Negative for diaphoresis.  Respiratory: Negative for shortness of breath.   Cardiovascular: Positive for chest pain.  Gastrointestinal: Negative for nausea.  Neurological: Negative for dizziness.  All other systems reviewed and are negative.    Physical Exam Updated Vital Signs There were  no vitals taken for this visit.  Physical Exam  Constitutional: He is oriented to person, place, and time. He appears well-developed and well-nourished. No distress.  HENT:  Head: Normocephalic and atraumatic.  Mouth/Throat: Oropharynx is clear and moist.  Neck: Normal range of motion. Neck supple.  Cardiovascular: Normal rate and regular rhythm. Exam reveals no friction rub.  No murmur heard. Pulmonary/Chest: Effort normal and breath sounds normal. No respiratory distress. He has no wheezes. He has no rales.  Abdominal: Soft. Bowel sounds are normal. He exhibits no distension. There is no tenderness.  Musculoskeletal: Normal range of motion. He exhibits no edema.       Right lower leg: Normal. He exhibits no tenderness and no edema.       Left lower leg: Normal. He exhibits no tenderness and no edema.  Neurological: He is alert and oriented to person, place, and time. Coordination normal.  Skin: Skin is warm and dry. He is not diaphoretic.  Nursing note and vitals reviewed.    ED Treatments / Results  Labs (all labs ordered are listed, but only abnormal results are displayed) Labs Reviewed  BASIC METABOLIC PANEL  CBC  I-STAT TROPONIN, ED    EKG EKG Interpretation  Date/Time:  Wednesday September 03 2018 02:40:50 EDT Ventricular Rate:  69 PR Interval:    QRS Duration: 103 QT Interval:  434 QTC Calculation: 504 R Axis:   48 Text Interpretation:  Sinus rhythm Atrial premature complexes Sinus pause Short PR interval LAE, consider biatrial enlargement Anterior infarct, old Borderline repolarization abnormality Prolonged QT interval Confirmed by Geoffery Lyons ([id number]) on 09/03/2018 3:10:38 AM   Radiology No results found.  Procedures Procedures (including critical care time)  Medications Ordered in ED Medications - No data to display   Initial Impression / Assessment and Plan / ED Course  I have reviewed the triage vital signs and the nursing notes.  Pertinent labs &  imaging results that were available during my care of the patient were reviewed by me and considered in my medical decision making (see chart for details).  Patient presenting with chest discomfort for the past day and a half.  He has nonspecific EKG changes and a troponin of 0.72.  This was discussed with cardiology who will evaluate and admit.  CRITICAL CARE Performed by: Geoffery Lyons Total critical care time: 35 minutes Critical care time was exclusive of separately billable procedures and treating other patients. Critical care was necessary to treat or prevent imminent or life-threatening deterioration. Critical care was time spent personally by me on the following activities: development of treatment plan with patient and/or surrogate as well as nursing, discussions with consultants, evaluation of patient's response to treatment, examination of patient, obtaining history from patient or surrogate, ordering and performing treatments and interventions, ordering and review of laboratory studies, ordering and review of radiographic studies, pulse oximetry and re-evaluation of patient's condition.   Final Clinical Impressions(s) / ED Diagnoses   Final diagnoses:  None    ED Discharge Orders    None       Geoffery Lyons, MD 09/03/18 [id number]    Geoffery Lyons, MD 09/15/18 1535

## 2018-09-03 NOTE — Progress Notes (Signed)
Initial Nutrition Assessment  DOCUMENTATION CODES:   Non-severe (moderate) malnutrition in context of chronic illness, Underweight  INTERVENTION:    When diet advances, add Ensure Enlive po TID (chocolate or strawberry), each supplement provides 350 kcal and 20 grams of protein  Add multivitamin daily  NUTRITION DIAGNOSIS:   Moderate Malnutrition related to chronic illness(COPD) as evidenced by mild muscle depletion, moderate muscle depletion, mild fat depletion, moderate fat depletion.  GOAL:   Patient will meet greater than or equal to 90% of their needs  MONITOR:   PO intake, Supplement acceptance, Labs  REASON FOR ASSESSMENT:   Malnutrition Screening Tool, Consult Assessment of nutrition requirement/status  ASSESSMENT:   72 yo male with PMH of COPD, HTN, AV block, ad PVD who was admitted on 10/30 with NSTEMI.  Patient reports that he has been losing weight for the past 2-3 years since he had a "kidney stone taken care of." He does not like to cook, so he eats sandwiches and cereal for breakfast, lunch, and supper every day, unless someone (usually his mother or sister) takes him somewhere to eat. He doesn't eat much at home because he doesn't like to cook.   Patient has increased nutrition needs due to COPD.  Labs reviewed. Potassium 3.4 (L) Medications reviewed and include KCl.   NUTRITION - FOCUSED PHYSICAL EXAM:    Most Recent Value  Orbital Region  Moderate depletion  Upper Arm Region  Moderate depletion  Thoracic and Lumbar Region  Mild depletion  Buccal Region  Mild depletion  Temple Region  Mild depletion  Clavicle Bone Region  Moderate depletion  Clavicle and Acromion Bone Region  Moderate depletion  Scapular Bone Region  Mild depletion  Dorsal Hand  Moderate depletion  Patellar Region  Moderate depletion  Anterior Thigh Region  Moderate depletion  Posterior Calf Region  Moderate depletion  Edema (RD Assessment)  None  Hair  Reviewed  Eyes   Reviewed  Mouth  Reviewed  Skin  Reviewed  Nails  Reviewed       Diet Order:   Diet Order            Diet NPO time specified  Diet effective now              EDUCATION NEEDS:   No education needs have been identified at this time  Skin:  Skin Assessment: Reviewed RN Assessment  Last BM:  unknown  Height:   Ht Readings from Last 1 Encounters:  09/03/18 5\' 8"  (1.727 m)    Weight:   Wt Readings from Last 1 Encounters:  09/03/18 51.5 kg    Ideal Body Weight:  70 kg  BMI:  Body mass index is 17.26 kg/m.  Estimated Nutritional Needs:   Kcal:  1800-2000  Protein:  80-95 gm  Fluid:  1.8 L    Joaquin Courts, RD, LDN, CNSC Pager [phone] After Hours Pager [phone]

## 2018-09-03 NOTE — Consult Note (Signed)
WOC Nurse ostomy consult note Patient with SP catheter  Tubing is dark but is draining.  No recent follow up with urology noted in chart.  Recommend replacing SP catheter if is time.  Patient is not aware of last time it was changed.  States he is not having problems. Will not follow at this time.  Please re-consult if needed.  Maple Hudson MSN, RN, FNP-BC CWON Wound, Ostomy, Continence Nurse Pager [phone]

## 2018-09-03 NOTE — Interval H&P Note (Signed)
Cath Lab Visit (complete for each Cath Lab visit)  Clinical Evaluation Leading to the Procedure:   ACS: Yes.   n/a  Non-ACS:  N/A     History and Physical Interval Note:  09/03/2018 3:04 PM  Joe Durham  has presented today for surgery, with the diagnosis of nstemi  The various methods of treatment have been discussed with the patient and family. After consideration of risks, benefits and other options for treatment, the patient has consented to  Procedure(s): LEFT HEART CATH AND CORONARY ANGIOGRAPHY (N/A) as a surgical intervention .  The patient's history has been reviewed, patient examined, no change in status, stable for surgery.  I have reviewed the patient's chart and labs.  Questions were answered to the patient's satisfaction.     Lorine Bears

## 2018-09-03 NOTE — Progress Notes (Signed)
  Echocardiogram 2D Echocardiogram has been performed.  Joe Durham 09/03/2018, 8:39 AM

## 2018-09-03 NOTE — ED Notes (Signed)
ECHO being performed at bedside.

## 2018-09-04 ENCOUNTER — Other Ambulatory Visit: Payer: Self-pay | Admitting: *Deleted

## 2018-09-04 ENCOUNTER — Encounter (HOSPITAL_COMMUNITY): Payer: Self-pay | Admitting: Cardiovascular Disease

## 2018-09-04 ENCOUNTER — Inpatient Hospital Stay (HOSPITAL_COMMUNITY): Payer: Medicare Other

## 2018-09-04 DIAGNOSIS — I2510 Atherosclerotic heart disease of native coronary artery without angina pectoris: Secondary | ICD-10-CM

## 2018-09-04 DIAGNOSIS — I10 Essential (primary) hypertension: Secondary | ICD-10-CM

## 2018-09-04 DIAGNOSIS — Z01810 Encounter for preprocedural cardiovascular examination: Secondary | ICD-10-CM

## 2018-09-04 DIAGNOSIS — I214 Non-ST elevation (NSTEMI) myocardial infarction: Secondary | ICD-10-CM

## 2018-09-04 DIAGNOSIS — I25110 Atherosclerotic heart disease of native coronary artery with unstable angina pectoris: Secondary | ICD-10-CM

## 2018-09-04 LAB — PULMONARY FUNCTION TEST
FEF 25-75 Post: 0.73 L/sec
FEF 25-75 Pre: 0.58 L/sec
FEF2575-%Change-Post: 25 %
FEF2575-%Pred-Post: 31 %
FEF2575-%Pred-Pre: 25 %
FEV1-%Change-Post: 12 %
FEV1-%Pred-Post: 78 %
FEV1-%Pred-Pre: 69 %
FEV1-Post: 2.09 L
FEV1-Pre: 1.86 L
FEV1FVC-%Change-Post: 8 %
FEV1FVC-%Pred-Pre: 62 %
FEV6-%Change-Post: 6 %
FEV6-%Pred-Post: 105 %
FEV6-%Pred-Pre: 98 %
FEV6-Post: 3.56 L
FEV6-Pre: 3.32 L
FEV6FVC-%Change-Post: 2 %
FEV6FVC-%Pred-Post: 92 %
FEV6FVC-%Pred-Pre: 90 %
FVC-%Change-Post: 3 %
FVC-%Pred-Post: 114 %
FVC-%Pred-Pre: 110 %
FVC-Post: 4.04 L
FVC-Pre: 3.91 L
Post FEV1/FVC ratio: 52 %
Post FEV6/FVC ratio: 88 %
Pre FEV1/FVC ratio: 48 %
Pre FEV6/FVC Ratio: 86 %

## 2018-09-04 LAB — COMPREHENSIVE METABOLIC PANEL
ALT: 14 U/L (ref 0–44)
AST: 25 U/L (ref 15–41)
Albumin: 2.9 g/dL — ABNORMAL LOW (ref 3.5–5.0)
Alkaline Phosphatase: 45 U/L (ref 38–126)
Anion gap: 9 (ref 5–15)
BUN: 8 mg/dL (ref 8–23)
CO2: 22 mmol/L (ref 22–32)
Calcium: 9.4 mg/dL (ref 8.9–10.3)
Chloride: 106 mmol/L (ref 98–111)
Creatinine, Ser: 1.07 mg/dL (ref 0.61–1.24)
GFR calc Af Amer: >60 mL/min (ref >60)
GFR calc non Af Amer: >60 mL/min (ref >60)
Glucose, Bld: 100 mg/dL — ABNORMAL HIGH (ref 70–99)
Potassium: 4.3 mmol/L (ref 3.5–5.1)
Sodium: 137 mmol/L (ref 135–145)
Total Bilirubin: 0.6 mg/dL (ref 0.3–1.2)
Total Protein: 6.7 g/dL (ref 6.5–8.1)

## 2018-09-04 LAB — HEPARIN LEVEL (UNFRACTIONATED)
HEPARIN UNFRACTIONATED: 0.24 [IU]/mL — AB (ref 0.30–0.70)
Heparin Unfractionated: <0.1 IU/mL — ABNORMAL LOW (ref 0.30–0.70)
Heparin Unfractionated: <0.1 IU/mL — ABNORMAL LOW (ref 0.30–0.70)

## 2018-09-04 LAB — URINALYSIS, ROUTINE W REFLEX MICROSCOPIC
Bilirubin Urine: NEGATIVE
Glucose, UA: NEGATIVE mg/dL
Ketones, ur: NEGATIVE mg/dL
Nitrite: NEGATIVE
Protein, ur: 30 mg/dL — AB
Specific Gravity, Urine: 1.016 (ref 1.005–1.030)
WBC, UA: >50 WBC/hpf — ABNORMAL HIGH (ref 0–5)
pH: 7 (ref 5.0–8.0)

## 2018-09-04 LAB — PROTIME-INR
INR: 1.08
Prothrombin Time: 13.9 seconds (ref 11.4–15.2)

## 2018-09-04 LAB — CBC
HEMATOCRIT: 35.1 % — AB (ref 39.0–52.0)
HEMOGLOBIN: 11.6 g/dL — AB (ref 13.0–17.0)
MCH: 34.6 pg — ABNORMAL HIGH (ref 26.0–34.0)
MCHC: 33 g/dL (ref 30.0–36.0)
MCV: 104.8 fL — ABNORMAL HIGH (ref 80.0–100.0)
NRBC: 0 % (ref 0.0–0.2)
Platelets: 204 10*3/uL (ref 150–400)
RBC: 3.35 MIL/uL — ABNORMAL LOW (ref 4.22–5.81)
RDW: 13.1 % (ref 11.5–15.5)
WBC: 6.1 10*3/uL (ref 4.0–10.5)

## 2018-09-04 LAB — SURGICAL PCR SCREEN
MRSA, PCR: NEGATIVE
Staphylococcus aureus: NEGATIVE

## 2018-09-04 LAB — BLOOD GAS, ARTERIAL
Acid-base deficit: 1.7 mmol/L (ref 0.0–2.0)
Bicarbonate: 21.6 mmol/L (ref 20.0–28.0)
Drawn by: 280981
FIO2: 21
O2 Saturation: 99.1 %
Patient temperature: 98.6
pCO2 arterial: 30.4 mmHg — ABNORMAL LOW (ref 32.0–48.0)
pH, Arterial: 7.464 — ABNORMAL HIGH (ref 7.350–7.450)
pO2, Arterial: 128 mmHg — ABNORMAL HIGH (ref 83.0–108.0)

## 2018-09-04 LAB — PREPARE RBC (CROSSMATCH)

## 2018-09-04 LAB — TSH: TSH: 1.427 u[IU]/mL (ref 0.350–4.500)

## 2018-09-04 LAB — ABO/RH: ABO/RH(D): A POS

## 2018-09-04 LAB — HIV ANTIBODY (ROUTINE TESTING W REFLEX): HIV Screen 4th Generation wRfx: NONREACTIVE

## 2018-09-04 MED ORDER — MAGNESIUM SULFATE 50 % IJ SOLN
40.0000 meq | INTRAMUSCULAR | Status: DC
  Filled 2018-09-04: qty 9.85

## 2018-09-04 MED ORDER — CHLORHEXIDINE GLUCONATE 4 % EX LIQD
60.0000 mL | Freq: Once | CUTANEOUS | Status: AC
  Administered 2018-09-04: 4 via TOPICAL
  Filled 2018-09-04: qty 60

## 2018-09-04 MED ORDER — ALBUTEROL SULFATE (2.5 MG/3ML) 0.083% IN NEBU
2.5000 mg | INHALATION_SOLUTION | Freq: Once | RESPIRATORY_TRACT | Status: AC
  Administered 2018-09-04: 2.5 mg via RESPIRATORY_TRACT

## 2018-09-04 MED ORDER — DOPAMINE-DEXTROSE 3.2-5 MG/ML-% IV SOLN
0.0000 ug/kg/min | INTRAVENOUS | Status: DC
  Filled 2018-09-04: qty 250

## 2018-09-04 MED ORDER — TRANEXAMIC ACID (OHS) PUMP PRIME SOLUTION
2.0000 mg/kg | INTRAVENOUS | Status: DC
  Filled 2018-09-04: qty 1.05

## 2018-09-04 MED ORDER — POTASSIUM CHLORIDE 2 MEQ/ML IV SOLN
80.0000 meq | INTRAVENOUS | Status: DC
  Filled 2018-09-04: qty 40

## 2018-09-04 MED ORDER — CHLORHEXIDINE GLUCONATE 0.12 % MT SOLN
15.0000 mL | Freq: Once | OROMUCOSAL | Status: AC
  Administered 2018-09-05: 15 mL via OROMUCOSAL
  Filled 2018-09-04: qty 15

## 2018-09-04 MED ORDER — NOREPINEPHRINE 4 MG/250ML-% IV SOLN
0.0000 ug/min | INTRAVENOUS | Status: DC
  Administered 2018-09-05: 2 ug/min via INTRAVENOUS
  Filled 2018-09-04: qty 250

## 2018-09-04 MED ORDER — DEXMEDETOMIDINE HCL IN NACL 400 MCG/100ML IV SOLN
0.1000 ug/kg/h | INTRAVENOUS | Status: AC
  Administered 2018-09-05: .3 ug/kg/h via INTRAVENOUS
  Filled 2018-09-04: qty 100

## 2018-09-04 MED ORDER — SODIUM CHLORIDE 0.9 % IV SOLN
750.0000 mg | INTRAVENOUS | Status: DC
  Filled 2018-09-04: qty 750

## 2018-09-04 MED ORDER — BISACODYL 5 MG PO TBEC: 5.0000 mg | DELAYED_RELEASE_TABLET | Freq: Once | ORAL | Status: DC

## 2018-09-04 MED ORDER — EPINEPHRINE PF 1 MG/ML IJ SOLN
0.0000 ug/min | INTRAVENOUS | Status: DC
  Filled 2018-09-04: qty 4

## 2018-09-04 MED ORDER — TRANEXAMIC ACID 1000 MG/10ML IV SOLN
1.5000 mg/kg/h | INTRAVENOUS | Status: AC
  Administered 2018-09-05: 1.5 mg/kg/h via INTRAVENOUS
  Filled 2018-09-04: qty 25

## 2018-09-04 MED ORDER — METOPROLOL TARTRATE 12.5 MG HALF TABLET: 12.5000 mg | ORAL_TABLET | Freq: Once | ORAL | Status: DC

## 2018-09-04 MED ORDER — VANCOMYCIN HCL IN DEXTROSE 1-5 GM/200ML-% IV SOLN
1000.0000 mg | INTRAVENOUS | Status: AC
  Administered 2018-09-05: 1000 mg via INTRAVENOUS
  Filled 2018-09-04: qty 200

## 2018-09-04 MED ORDER — INSULIN REGULAR(HUMAN) IN NACL 100-0.9 UT/100ML-% IV SOLN
INTRAVENOUS | Status: AC
  Administered 2018-09-05: 1 [IU]/h via INTRAVENOUS
  Filled 2018-09-04: qty 100

## 2018-09-04 MED ORDER — PHENYLEPHRINE HCL-NACL 20-0.9 MG/250ML-% IV SOLN
30.0000 ug/min | INTRAVENOUS | Status: AC
  Administered 2018-09-05: 20 ug/min via INTRAVENOUS
  Filled 2018-09-04: qty 250

## 2018-09-04 MED ORDER — ALPRAZOLAM 0.25 MG PO TABS: 0.2500 mg | ORAL_TABLET | ORAL | Status: DC | PRN

## 2018-09-04 MED ORDER — DIAZEPAM 2 MG PO TABS
2.0000 mg | ORAL_TABLET | Freq: Once | ORAL | Status: AC
  Administered 2018-09-05: 2 mg via ORAL
  Filled 2018-09-04: qty 1

## 2018-09-04 MED ORDER — TEMAZEPAM 15 MG PO CAPS: 15.0000 mg | ORAL_CAPSULE | Freq: Once | ORAL | Status: DC | PRN

## 2018-09-04 MED ORDER — CHLORHEXIDINE GLUCONATE 4 % EX LIQD
60.0000 mL | Freq: Once | CUTANEOUS | Status: AC
  Administered 2018-09-05: 4 via TOPICAL
  Filled 2018-09-04: qty 60

## 2018-09-04 MED ORDER — SODIUM CHLORIDE 0.9 % IV SOLN
1.5000 g | INTRAVENOUS | Status: AC
  Administered 2018-09-05: 1.5 g via INTRAVENOUS
  Filled 2018-09-04: qty 1.5

## 2018-09-04 MED ORDER — NITROGLYCERIN IN D5W 200-5 MCG/ML-% IV SOLN
2.0000 ug/min | INTRAVENOUS | Status: DC
  Filled 2018-09-04: qty 250

## 2018-09-04 MED ORDER — SODIUM CHLORIDE 0.9 % IV SOLN
INTRAVENOUS | Status: DC
  Filled 2018-09-04 (×2): qty 30

## 2018-09-04 MED ORDER — PLASMA-LYTE 148 IV SOLN
INTRAVENOUS | Status: AC
  Administered 2018-09-05: 500 mL
  Filled 2018-09-04: qty 2.5

## 2018-09-04 MED ORDER — TRANEXAMIC ACID (OHS) BOLUS VIA INFUSION
15.0000 mg/kg | INTRAVENOUS | Status: AC
  Administered 2018-09-05: 786 mg via INTRAVENOUS
  Filled 2018-09-04: qty 786

## 2018-09-04 MED ORDER — MILRINONE LACTATE IN DEXTROSE 20-5 MG/100ML-% IV SOLN
0.3000 ug/kg/min | INTRAVENOUS | Status: AC
  Administered 2018-09-05: 0.25 ug/kg/min via INTRAVENOUS
  Filled 2018-09-04: qty 100

## 2018-09-04 NOTE — Progress Notes (Addendum)
Progress Note  Patient Name: Joe Durham Date of Encounter: 09/04/2018  Primary Cardiologist: Thurmon Fair, MD   Subjective   No pain, was not aware of need for CABG, discussed consult with surgeon.    Inpatient Medications    Scheduled Meds: . aspirin EC  81 mg Oral Daily  . atorvastatin  80 mg Oral q1800  . metoprolol tartrate  12.5 mg Oral BID  . nicotine  14 mg Transdermal Daily  . sodium chloride flush  3 mL Intravenous Q12H   Continuous Infusions: . sodium chloride 500 mL (09/03/18 0918)  . sodium chloride    . heparin 800 Units/hr (09/04/18 0724)   PRN Meds: sodium chloride, sodium chloride, acetaminophen, nitroGLYCERIN, ondansetron (ZOFRAN) IV, sodium chloride flush   Vital Signs    Vitals:   09/03/18 1529 09/03/18 1938 09/03/18 2207 09/04/18 0516  BP: 133/80 (!) 142/76  (!) 141/85  Pulse: 65 (!) 49 65 (!) 42  Resp: (!) 9 16  18   Temp:    (!) 97.3 F (36.3 C)  TempSrc:    Oral  SpO2: (!) 0% 98%  100%  Weight:    52.4 kg  Height:        Intake/Output Summary (Last 24 hours) at 09/04/2018 0805 Last data filed at 09/04/2018 0529 Gross per 24 hour  Intake 1627.89 ml  Output 400 ml  Net 1227.89 ml   Filed Weights   09/03/18 0911 09/04/18 0516  Weight: 51.5 kg 52.4 kg    Telemetry    2:1 block, Mobitz 1 - Personally Reviewed  ECG    SB at 50 with 2:1 block and peaked T waves in ant lat leads - Personally Reviewed  Physical Exam   GEN: No acute distress.   Neck: No JVD Cardiac: irreg, no murmurs, rubs, or gallops.  Respiratory: Clear to auscultation bilaterally. Though occ wheeze. GI: Soft, nontender, non-distended , supra pubic cath in place MS: No edema; No deformity. Neuro:  Nonfocal  Psych: Normal affect   Labs    Chemistry Recent Labs  Lab 09/03/18 0300  NA 136  K 3.4*  CL 102  CO2 24  GLUCOSE 98  BUN 9  CREATININE 1.03  CALCIUM 9.4  GFRNONAA >60  GFRAA >60  ANIONGAP 10     Hematology Recent Labs  Lab  09/03/18 0300 09/04/18 0507  WBC 8.5 6.1  RBC 3.69* 3.35*  HGB 13.0 11.6*  HCT 39.3 35.1*  MCV 106.5* 104.8*  MCH 35.2* 34.6*  MCHC 33.1 33.0  RDW 13.2 13.1  PLT 199 204    Cardiac Enzymes Recent Labs  Lab 09/03/18 0838 09/03/18 1044 09/03/18 1912  TROPONINI 0.63* 0.60* 0.57*    Recent Labs  Lab 09/03/18 0303  TROPIPOC 0.72*     BNPNo results for input(s): BNP, PROBNP in the last 168 hours.   DDimer No results for input(s): DDIMER in the last 168 hours.   Radiology    Dg Chest 2 View  Result Date: 09/03/2018 CLINICAL DATA:  Initial evaluation for acute mid chest pain. EXAM: CHEST - 2 VIEW COMPARISON:  Prior CT from 01/05/2015. FINDINGS: Transverse heart size within normal limits. Prominence of the ascending aortic margin, which could reflect aneurysm. Mediastinal silhouette otherwise within normal limits. Lungs hyperinflated. No focal infiltrates. No pulmonary edema or pleural effusion. No pneumothorax. No acute osseous abnormality. IMPRESSION: 1. Hyperinflation with no other active cardiopulmonary disease identified. 2. Prominence of the ascending aortic contour. While this may be related to tortuosity, possible ascending  aortic aneurysm could also be considered. Consider follow-up with CTA or MRA for further evaluation. Electronically Signed   By: Rise Mu M.D.   On: 09/03/2018 04:22    Cardiac Studies   Echo 09/03/18 Study Conclusions  - Left ventricle: The cavity size was normal. Wall thickness was   increased in a pattern of mild LVH. There was focal basal   hypertrophy. Systolic function was normal. The estimated ejection   fraction was in the range of 60% to 65%. Wall motion was normal;   there were no regional wall motion abnormalities. The study is   not technically sufficient to allow evaluation of LV diastolic   function. - Mitral valve: There was mild regurgitation. - Right ventricle: Systolic function was mildly to moderately    reduced. - Pulmonary arteries: Systolic pressure was mildly increased. PA   peak pressure: 32 mm Hg (S).  Cardiac cath 09/03/18  Ost Cx to Prox Cx lesion is 99% stenosed.  Mid Cx to Dist Cx lesion is 80% stenosed.  Dist LM to Prox LAD lesion is 90% stenosed.  Ost 1st Diag lesion is 99% stenosed.  Prox RCA to Mid RCA lesion is 80% stenosed.  Dist RCA lesion is 60% stenosed.   1.  Severe heavily calcified three-vessel coronary artery disease.  The coronary arteries are very tortuous. 2.  Left ventricular angiography was not performed.  EF was normal by echo. 3.  Mildly elevated left ventricular end-diastolic pressure at 17 mmHg.  Recommendations: Recommend surgical consult for CABG.  PCI options are limited by severe calcifications and tortuosity that make atherectomy high risk for dissection and perforation. Resume heparin 8 hours after sheath pull.   Patient Profile     72 y.o. male admitted with with a history of paroxysmal atrial tachycardia and Mobitz type 1 second degree AV block on loop recorder in 2016, stroke in 2016 due to right posterior cerebral artery embolism, peripheral artery disease, hypertension, and hyperlipidemia.  + severe CAD on cath, TCTS to eval for CABG  Assessment & Plan    NSTEMI with severe CAD on Cath --TCTS to see for eval.  Will check with their office -have a call in.  -pain none currently  -heparin IV     HTN BB started at low dose but HR low and 2:1 block may need to stop.  HLD, on lipitor 40  Suprapubic catheter-  Evaluated by wound care --unsure of last time it was changed no redness.  Draining well. Unsure when changed the last time, will wait for consult with TCTS for plan.   Mobitz 1 HR in upper 40s and ow 50s, and with PVCs.  BB added yesterday, previously had been stopped for brady  + tobacco use, has on nicoderm patch - discussed importance of stopping tobacco.      For questions or updates, please contact [facility] Please consult [email or web address] for contact info under        Signed, Nada Boozer, NP  09/04/2018, 8:05 AM

## 2018-09-04 NOTE — Consult Note (Addendum)
[address]       [place] [address]             [phone]        Nilesh Stegall [facility] Medical Record #[id number] Date of Birth: Dec 20, 1945  Referring: No ref. provider found Primary Care: Patient, No Pcp Per Primary Cardiologist:Mihai Croitoru, MD  Chief Complaint:    Chief Complaint  Patient presents with  . Chest Pain    History of Present Illness:    The patient is a 72 year old male with a history of paroxysmal atrial tachycardia ( loop recorder placed March 2016), previous stroke in 2016 with right posterior cerebral artery embolism, previous MI, peripheral arterial disease with right leg claudication, hypertension, history of tobacco abuse and hyperlipidemia who presented to the emergency department with left-sided chest pain.  The pain would also radiate into the left arm.  The pain has been noted previously in short episodes lasting approximately 3 to 5 seconds however a day prior to presentation the pain lasted greater than 30 minutes.  During this episode the patient also had nausea and vomiting as well as diaphoresis.  He called EMS and was given 2 doses of sublingual nitroglycerin with some improvement in the chest pain.  Additionally he was given 4 baby aspirin and brought to the emergency department for further management.  Initial ECG revealed sinus rhythm with frequent PACs and poor R wave progression in the anterior precordial leads, and T wave inversions in the lateral leads.  Initial troponin high was 0.72.  The patient was admitted for further evaluation and treatment including cardiology consultation.  Echocardiogram and cardiac catheterization were done and the reports are listed below.  Due to the severity of the anatomical findings of his coronary artery disease we are asked to see the patient in cardiothoracic surgical consultation for consideration of coronary artery surgical revascularization.    Current Activity/ Functional  Status: Patient is independent with mobility/ambulation, transfers, ADL's, IADL's.   Zubrod Score: At the time of surgery this patient's most appropriate activity status/level should be described as: []     0    Normal activity, no symptoms []     1    Restricted in physical strenuous activity but ambulatory, able to do out light work []     2    Ambulatory and capable of self care, unable to do work activities, up and about                 more than 50%  Of the time                            []     3    Only limited self care, in bed greater than 50% of waking hours []     4    Completely disabled, no self care, confined to bed or chair []     5    Moribund  Past Medical History:  Diagnosis Date  . Bladder calculi   . COPD (chronic obstructive pulmonary disease) (HCC)   . DOE (dyspnea on exertion)   . Foley catheter in place   . History of bladder stone   . 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 History of urinary retention   . Hypertension   . Mobitz type 1 second degree atrioventricular  block   . Osteoarthritis   . Paroxysmal atrial tachycardia (HCC)   . Peripheral vascular disease (HCC)    last duplex 09-25-2016 bilateral ABI 0.6 and occulsion of  right popiteal posterior tibials and left peroneal  . Wears glasses     Past Surgical History:  Procedure Laterality Date  . CYSTOSCOPY WITH LITHOLAPAXY N/A 07/15/2017   Procedure: OPEN CYSTOLITHOTOMY;  Surgeon: Crista Elliot, MD;  Location: [facility] ORS;  Service: Urology;  Laterality: N/A;  . INGUINAL HERNIA REPAIR Right 2011  . INSERTION OF SUPRAPUBIC CATHETER N/A 07/15/2017   Procedure: INSERTION OF SUPRAPUBIC CATHETER;  Surgeon: Crista Elliot, MD;  Location: [facility] ORS;  Service: Urology;  Laterality: N/A;  . LEFT HEART CATH AND CORONARY ANGIOGRAPHY N/A 09/03/2018   Procedure: LEFT HEART CATH AND CORONARY ANGIOGRAPHY;   Surgeon: Iran Ouch, MD;  Location: [facility] INVASIVE CV LAB;  Service: Cardiovascular;  Laterality: N/A;  . LOOP RECORDER IMPLANT N/A 01/05/2015   Procedure: LOOP RECORDER IMPLANT;  Surgeon: Thurmon Fair, MD;  Location: [facility] CATH LAB;  Service: Cardiovascular;  Laterality: N/A;  . ORIF WRIST FRACTURE Left 03/20/2014   Procedure: OPEN REDUCTION INTERNAL FIXATION (ORIF) WRIST FRACTURE;  Surgeon: Dominica Severin, MD;  Location: [facility] OR;  Service: Orthopedics;  Laterality: Left;  . TEE WITHOUT CARDIOVERSION N/A 01/05/2015   Procedure: TRANSESOPHAGEAL ECHOCARDIOGRAM (TEE)/LOOP;  Surgeon: Thurmon Fair, MD;  Location: [facility] ENDOSCOPY;  Service: Cardiovascular;  Laterality: N/A; ef 60-65%, no LA or RA thrombus, trivial PR, mild TR  . TONSILLECTOMY  child  . TOTAL HIP ARTHROPLASTY Right 2000    Social History   Tobacco Use  Smoking Status Current Every Day Smoker  . Years: 52.00  . Types: Cigarettes  Smokeless Tobacco Never Used  Tobacco Comment   07-08-2018 pt is down to 1ppw from 1 ppd    Social History   Substance and Sexual Activity  Alcohol Use Yes  . Alcohol/week: 0.0 standard drinks   Comment: 08/2018 ! drink 1 pint a day 3 times a week on average     No Known Allergies  Current Facility-Administered Medications  Medication Dose Route Frequency Provider Last Rate Last Dose  . 0.9 %  sodium chloride infusion   Intravenous PRN Iran Ouch, MD 10 mL/hr at 09/03/18 0918 500 mL at 09/03/18 0918  . 0.9 %  sodium chloride infusion  250 mL Intravenous PRN Lorine Bears A, MD      . acetaminophen (TYLENOL) tablet 650 mg  650 mg Oral Q4H PRN Iran Ouch, MD      . aspirin EC tablet 81 mg  81 mg Oral Daily Lorine Bears A, MD   81 mg at 09/04/18 1026  . atorvastatin (LIPITOR) tablet 80 mg  80 mg Oral q1800 Lorine Bears A, MD   80 mg at 09/03/18 1656  . [START ON 09/05/2018] cefUROXime (ZINACEF) 1.5 g in sodium chloride 0.9 % 100 mL IVPB  1.5 g Intravenous To OR Donata Clay, Theron Arista,  MD      . Melene Muller ON 09/05/2018] cefUROXime (ZINACEF) 750 mg in sodium chloride 0.9 % 100 mL IVPB  750 mg Intravenous To OR Donata Clay, Theron Arista, MD      . Melene Muller ON 09/05/2018] dexmedetomidine (PRECEDEX) 400 MCG/100ML (4 mcg/mL) infusion  0.1-0.7 mcg/kg/hr Intravenous To OR Donata Clay, Theron Arista, MD      . Melene Muller ON 09/05/2018] DOPamine (INTROPIN) 800 mg in dextrose 5 % 250 mL (3.2 mg/mL) infusion  0-10 mcg/kg/min Intravenous To OR  Donata Clay, Theron Arista, MD      . Melene Muller ON 09/05/2018] EPINEPHrine (ADRENALIN) 4 mg in dextrose 5 % 250 mL (0.016 mg/mL) infusion  0-10 mcg/min Intravenous To OR Donata Clay, Theron Arista, MD      . Melene Muller ON 09/05/2018] heparin 2,500 Units, papaverine 30 mg in electrolyte-148 (PLASMALYTE-148) 500 mL irrigation   Irrigation To OR Donata Clay, Theron Arista, MD      . Melene Muller ON 09/05/2018] heparin 30,000 units/NS 1000 mL solution for CELLSAVER   Other To OR Donata Clay, Theron Arista, MD      . heparin ADULT infusion 100 units/mL (25000 units/250mL sodium chloride 0.45%)  1,000 Units/hr Intravenous Continuous Almon Hercules, RPH 10 mL/hr at 09/04/18 1354 1,000 Units/hr at 09/04/18 1354  . [START ON 09/05/2018] insulin regular, human (MYXREDLIN) 100 units/ 100 mL infusion   Intravenous To OR Donata Clay, Theron Arista, MD      . Melene Muller ON 09/05/2018] magnesium sulfate (IV Push/IM) injection 40 mEq  40 mEq Other To OR Donata Clay, Theron Arista, MD      . metoprolol tartrate (LOPRESSOR) tablet 12.5 mg  12.5 mg Oral BID Lorine Bears A, MD   12.5 mg at 09/04/18 1026  . [START ON 09/05/2018] milrinone (PRIMACOR) 20 MG/100 ML (0.2 mg/mL) infusion  0.3 mcg/kg/min Intravenous To OR Donata Clay, Theron Arista, MD      . nicotine (NICODERM CQ - dosed in mg/24 hours) patch 14 mg  14 mg Transdermal Daily Lorine Bears A, MD   14 mg at 09/04/18 1026  . nitroGLYCERIN (NITROSTAT) SL tablet 0.4 mg  0.4 mg Sublingual Q5 Min x 3 PRN Iran Ouch, MD      . Melene Muller ON 09/05/2018] nitroGLYCERIN 50 mg in dextrose 5 % 250 mL (0.2 mg/mL) infusion  2-200 mcg/min  Intravenous To OR Donata Clay, Theron Arista, MD      . Melene Muller ON 09/05/2018] norepinephrine (LEVOPHED) 4mg  in D5W premix infusion  0-40 mcg/min Intravenous Titrated Donata Clay, Theron Arista, MD      . ondansetron [facility]) injection 4 mg  4 mg Intravenous Q6H PRN Iran Ouch, MD      . Melene Muller ON 09/05/2018] phenylephrine (NEOSYNEPHRINE) 20-0.9 MG/250ML-% infusion  30-200 mcg/min Intravenous To OR Donata Clay, Theron Arista, MD      . Melene Muller ON 09/05/2018] potassium chloride injection 80 mEq  80 mEq Other To OR Donata Clay, Theron Arista, MD      . sodium chloride flush (NS) 0.9 % injection 3 mL  3 mL Intravenous Q12H Arida, Muhammad A, MD      . sodium chloride flush (NS) 0.9 % injection 3 mL  3 mL Intravenous PRN Iran Ouch, MD      . Melene Muller ON 09/05/2018] tranexamic acid (CYKLOKAPRON) 2,500 mg in sodium chloride 0.9 % 250 mL (10 mg/mL) infusion  1.5 mg/kg/hr Intravenous To OR Donata Clay, Theron Arista, MD      . Melene Muller ON 09/05/2018] tranexamic acid (CYKLOKAPRON) bolus via infusion - over 30 minutes 786 mg  15 mg/kg Intravenous To OR Donata Clay, Theron Arista, MD      . Melene Muller ON 09/05/2018] tranexamic acid (CYKLOKAPRON) pump prime solution 105 mg  2 mg/kg Intracatheter To OR Donata Clay, Theron Arista, MD      . Melene Muller ON 09/05/2018] vancomycin (VANCOCIN) IVPB 1000 mg/200 mL premix  1,000 mg Intravenous To OR Kerin Perna, MD        Medications Prior to Admission  Medication Sig Dispense Refill Last Dose  . amLODipine (NORVASC) 10 MG tablet Take  10 mg by mouth every morning.    Not Taking at Unknown time  . aspirin EC 81 MG tablet Take 1 tablet (81 mg total) by mouth daily. (Patient not taking: Reported on 09/03/2018) 90 tablet 3 Not Taking at Unknown time  . atorvastatin (LIPITOR) 40 MG tablet Take 1 tablet (40 mg total) by mouth daily at 6 PM. (Patient not taking: Reported on 09/03/2018) 90 tablet 3 Not Taking at Unknown time    Family History  Problem Relation Age of Onset  . Stroke Mother   . Stroke Sister      Review of Systems:    Review of Systems  Constitutional: Positive for diaphoresis, malaise/fatigue and weight loss. Negative for chills and fever.  HENT: Positive for congestion, hearing loss and sinus pain. Negative for ear discharge, ear pain, nosebleeds, sore throat and tinnitus.   Eyes: Negative for blurred vision, double vision, photophobia, pain, discharge and redness.  Respiratory: Positive for sputum production, shortness of breath and wheezing. Negative for cough, hemoptysis and stridor.   Cardiovascular: Positive for chest pain, palpitations, claudication and leg swelling. Negative for orthopnea and PND.  Gastrointestinal: Positive for abdominal pain, constipation, heartburn, nausea and vomiting. Negative for blood in stool, diarrhea and melena.  Genitourinary: Negative for dysuria, flank pain, frequency, hematuria and urgency.       Has cystoscopy  Musculoskeletal: Negative for back pain, falls, joint pain, myalgias and neck pain.  Skin: Negative for rash.  Neurological: Positive for tingling, sensory change and speech change. Negative for dizziness, tremors, focal weakness, seizures, loss of consciousness, weakness and headaches.  Endo/Heme/Allergies: Negative for environmental allergies and polydipsia. Does not bruise/bleed easily.  Psychiatric/Behavioral: Positive for hallucinations, memory loss and substance abuse. Negative for depression and suicidal ideas. The patient is not nervous/anxious and does not have insomnia.        Remote marijuana and crack use around 10 years since last use      Physical Exam: BP 130/61 (BP Location: Left Arm)   Pulse (!) 42   Temp (!) 97.4 F (36.3 C) (Oral)   Resp 18   Ht 5\' 8"  (1.727 m)   Wt 52.4 kg   SpO2 100%   BMI 17.56 kg/m   Physical Exam  Constitutional: He is oriented to person, place, and time. He appears well-developed and well-nourished. No distress.  HENT:  Head: Normocephalic and atraumatic.  Mouth/Throat: Oropharynx is clear and moist. No  oropharyngeal exudate.  Few teeth  Eyes: Pupils are equal, round, and reactive to light. EOM are normal. Left eye exhibits no discharge. No scleral icterus.  Arcus senilis  Neck: Normal range of motion. Neck supple. No JVD present. No tracheal deviation present. No thyromegaly present.  No carotid bruits  Cardiovascular: Normal rate, regular rhythm and normal heart sounds. Exam reveals no gallop and no friction rub.  No murmur heard. Absent dorsalis pedis/posterior tibial bilaterally  Pulmonary/Chest: No stridor. No respiratory distress. He has no wheezes. He has no rales. He exhibits no tenderness.  Fair air exchange throughout  Abdominal: He exhibits no distension and no mass. There is tenderness. There is no rebound and no guarding.  Mild epigastric tenderness to palpation and also mild lower abdominal tenderness to palpation around the region of the cystoscopy tube  Musculoskeletal: He exhibits no edema, tenderness or deformity.  Lymphadenopathy:    He has no cervical adenopathy.  Neurological: He is alert and oriented to person, place, and time. No cranial nerve deficit. He exhibits normal muscle tone.  Coordination normal.  Skin: Skin is warm and dry. No rash noted. He is not diaphoretic. No erythema. No pallor.  Psychiatric: He has a normal mood and affect. His behavior is normal. Judgment and thought content normal.      Diagnostic Studies & Laboratory data:     Recent Radiology Findings:   Dg Chest 2 View  Result Date: 09/03/2018 CLINICAL DATA:  Initial evaluation for acute mid chest pain. EXAM: CHEST - 2 VIEW COMPARISON:  Prior CT from 01/05/2015. FINDINGS: Transverse heart size within normal limits. Prominence of the ascending aortic margin, which could reflect aneurysm. Mediastinal silhouette otherwise within normal limits. Lungs hyperinflated. No focal infiltrates. No pulmonary edema or pleural effusion. No pneumothorax. No acute osseous abnormality. IMPRESSION: 1.  Hyperinflation with no other active cardiopulmonary disease identified. 2. Prominence of the ascending aortic contour. While this may be related to tortuosity, possible ascending aortic aneurysm could also be considered. Consider follow-up with CTA or MRA for further evaluation. Electronically Signed   By: Rise Mu M.D.   On: 09/03/2018 04:22     I have independently reviewed the above radiologic studies and discussed with the patient   Recent Lab Findings: Lab Results  Component Value Date   WBC 6.1 09/04/2018   HGB 11.6 (L) 09/04/2018   HCT 35.1 (L) 09/04/2018   PLT 204 09/04/2018   GLUCOSE 98 09/03/2018   CHOL 173 03/27/2018   TRIG 49 03/27/2018   HDL 76 03/27/2018   LDLCALC 87 03/27/2018   ALT 8 03/27/2018   AST 23 03/27/2018   NA 136 09/03/2018   K 3.4 (L) 09/03/2018   CL 102 09/03/2018   CREATININE 1.03 09/03/2018   BUN 9 09/03/2018   CO2 24 09/03/2018   TSH 1.513 01/04/2015   INR 0.98 03/20/2014   HGBA1C 4.7 (L) 01/04/2015    Procedures   LEFT HEART CATH AND CORONARY ANGIOGRAPHY  Conclusion     Ost Cx to Prox Cx lesion is 99% stenosed.  Mid Cx to Dist Cx lesion is 80% stenosed.  Dist LM to Prox LAD lesion is 90% stenosed.  Ost 1st Diag lesion is 99% stenosed.  Prox RCA to Mid RCA lesion is 80% stenosed.  Dist RCA lesion is 60% stenosed.   1.  Severe heavily calcified three-vessel coronary artery disease.  The coronary arteries are very tortuous. 2.  Left ventricular angiography was not performed.  EF was normal by echo. 3.  Mildly elevated left ventricular end-diastolic pressure at 17 mmHg.  Recommendations: Recommend surgical consult for CABG.  PCI options are limited by severe calcifications and tortuosity that make atherectomy high risk for dissection and perforation. Resume heparin 8 hours after sheath pull.   Indications   Non-ST elevation (NSTEMI) myocardial infarction (HCC) [I21.4 (ICD-10-CM)]  Procedural Details/Technique    Technical Details Procedural Details: The right wrist was prepped, draped, and anesthetized with 1% lidocaine. Using the modified Seldinger technique, a 5 French sheath was introduced into the right radial artery. 3 mg of verapamil was administered through the sheath, weight-based unfractionated heparin was administered intravenously. There was resistance advancing the J-wire and the versa core wire at the elbow area. I performed angiography which showed a tortuous segment of the radial artery. I was able to navigate that with a run-through wire and then advanced the Jacky catheter to the ascending aorta. This catheter was used for coronary angiography as well as LV pressure. Left ventricular angiography was not performed. There were no immediate procedural complications. A TR  band was used for radial hemostasis at the completion of the procedure. The patient was transferred to the post catheterization recovery area for further monitoring.   Estimated blood loss <50 mL.  During this procedure the patient was administered the following to achieve and maintain moderate conscious sedation: Versed 1 mg, Fentanyl 25 mcg, while the patient's heart rate, blood pressure, and oxygen saturation were continuously monitored. The period of conscious sedation was 23 minutes, of which I was present face-to-face 100% of this time.  Coronary Findings   Diagnostic  Dominance: Right  Left Main  Vessel is angiographically normal.  Dist LM to Prox LAD lesion 90% stenosed  Dist LM to Prox LAD lesion is 90% stenosed. The lesion is type C. The lesion is severely calcified.  Left Anterior Descending  First Diagonal Branch  Ost 1st Diag lesion 99% stenosed  Ost 1st Diag lesion is 99% stenosed.  Left Circumflex  Ost Cx to Prox Cx lesion 99% stenosed  Ost Cx to Prox Cx lesion is 99% stenosed. The lesion is type C. The lesion is severely calcified.  Mid Cx to Dist Cx lesion 80% stenosed  Mid Cx to Dist Cx lesion is  80% stenosed. The lesion is type C. The lesion is severely calcified.  Right Coronary Artery  Prox RCA to Mid RCA lesion 80% stenosed  Prox RCA to Mid RCA lesion is 80% stenosed. The lesion is type C. The lesion is severely calcified.  Dist RCA lesion 60% stenosed  Dist RCA lesion is 60% stenosed.  Intervention   No interventions have been documented.  Coronary Diagrams   Diagnostic Diagram            ECHOCARDIOGRAM: Result status: Final result                              **                   *[facility]*                         [address]                        [place], [place] [address]                            [phone]  ------------------------------------------------------------------- Transthoracic Echocardiography  Patient:    Andrae, Claunch MR #:       [id number] Study Date: 09/03/2018 Gender:     M Age:        72 Height:     172.7 cm Weight:     53.1 kg BSA:        1.58 m^2 Pt. Status: Room:       6E05C   ATTENDING    Zoila Shutter MD  PERFORMING   [facility], Inpatient  SONOGRAPHER  Leta Jungling, RDCS  ORDERING     Akhter, Mohammed W  REFERRING    [place], Mohammed W  cc:  ------------------------------------------------------------------- LV EF: 60% -   65%  ------------------------------------------------------------------- Indications:      Chest pain 786.51.  ------------------------------------------------------------------- History:   PMH:  Atrial Tachycardia.  Atrial fibrillation.  Stroke.  Chronic obstructive pulmonary disease.  Risk factors: Hypertension.  ------------------------------------------------------------------- Study Conclusions  - Left ventricle: The cavity size was normal. Wall thickness  was   increased in a pattern of mild LVH. There was focal basal   hypertrophy. Systolic function was normal. The estimated ejection   fraction was in the range of 60% to 65%. Wall motion was  normal;   there were no regional wall motion abnormalities. The study is   not technically sufficient to allow evaluation of LV diastolic   function. - Mitral valve: There was mild regurgitation. - Right ventricle: Systolic function was mildly to moderately   reduced. - Pulmonary arteries: Systolic pressure was mildly increased. PA   peak pressure: 32 mm Hg (S).  ------------------------------------------------------------------- Study data:  Comparison was made to the study of 01/05/2015.  Study status:  Routine.  Procedure:  The patient reported no pain pre or post test. Transthoracic echocardiography. Image quality was adequate.  Study completion:  There were no complications. Transthoracic echocardiography.  M-mode, complete 2D, spectral Doppler, and color Doppler.  Birthdate:  Patient birthdate: March 01, 1946.  Age:  Patient is 72 yr old.  Sex:  Gender: male. BMI: 17.8 kg/m^2.  Blood pressure:     148/77  Patient status: Inpatient.  Study date:  Study date: 09/03/2018. Study time: 07:59 AM.  Location:  Emergency department.  -------------------------------------------------------------------  ------------------------------------------------------------------- Left ventricle:  The cavity size was normal. Wall thickness was increased in a pattern of mild LVH. There was focal basal hypertrophy. Systolic function was normal. The estimated ejection fraction was in the range of 60% to 65%. Wall motion was normal; there were no regional wall motion abnormalities. The study is not technically sufficient to allow evaluation of LV diastolic function.  ------------------------------------------------------------------- Aortic valve:   Mildly calcified leaflets. Cusp separation was normal.  Doppler:  Transvalvular velocity was within the normal range. There was no stenosis. There was no regurgitation.  ------------------------------------------------------------------- Aorta:   Aortic root: The aortic root was normal in size. Ascending aorta: The ascending aorta was normal in size.  ------------------------------------------------------------------- Mitral valve:   Structurally normal valve.   Leaflet separation was normal.  Doppler:  Transvalvular velocity was within the normal range. There was no evidence for stenosis. There was mild regurgitation.  ------------------------------------------------------------------- Left atrium:  The atrium was normal in size.  ------------------------------------------------------------------- Right ventricle:  The cavity size was normal. Systolic function was mildly to moderately reduced.  ------------------------------------------------------------------- Pulmonic valve:    The valve appears to be grossly normal. Doppler:  There was no significant regurgitation.  ------------------------------------------------------------------- Tricuspid valve:   The valve appears to be grossly normal. Doppler:  There was mild regurgitation.  ------------------------------------------------------------------- Pulmonary artery:   Systolic pressure was mildly increased.  ------------------------------------------------------------------- Right atrium:  The atrium was normal in size.  ------------------------------------------------------------------- Pericardium:  There was no pericardial effusion.  ------------------------------------------------------------------- Measurements   Left ventricle                             Value        Reference  LV ID, ED, PLAX chordal             (L)    38.3  mm     43 - 52  LV ID, ES, PLAX chordal                    31.1  mm     23 - 38  LV fx shortening, PLAX chordal      (L)    19    %      >=29  LV PW thickness, ED                        7.49  mm     ---------  IVS/LV PW ratio, ED                 (H)    1.75         <=1.3  Stroke volume, 2D                          51    ml      ---------  Stroke volume/bsa, 2D                      32    ml/m^2 ---------  Longitudinal strain, TDI                   13    %      ---------    Ventricular septum                         Value        Reference  IVS thickness, ED                          13.1  mm     ---------    LVOT                                       Value        Reference  LVOT ID, S                                 19    mm     ---------  LVOT area                                  2.84  cm^2   ---------  LVOT mean velocity, S                      50.2  cm/s   ---------  LVOT VTI, S                                17.8  cm     ---------    Aorta                                      Value        Reference  Aortic root ID, ED                         31    mm     ---------    Left atrium                                Value        Reference  LA ID,  A-P, ES                             35    mm     ---------  LA ID/bsa, A-P                      (H)    2.21  cm/m^2 <=2.2  LA volume, S                               41.8  ml     ---------  LA volume/bsa, S                           26.4  ml/m^2 ---------  LA volume, ES, 1-p A4C                     49.8  ml     ---------  LA volume/bsa, ES, 1-p A4C                 31.4  ml/m^2 ---------  LA volume, ES, 1-p A2C                     32.1  ml     ---------  LA volume/bsa, ES, 1-p A2C                 20.3  ml/m^2 ---------    Mitral valve                               Value        Reference  Mitral maximal regurg velocity,            545   cm/s   ---------  PISA  Mitral regurg VTI, PISA                    192   cm     ---------  Mitral ERO, PISA                           0.04  cm^2   ---------  Mitral regurg volume, PISA                 8     ml     ---------    Pulmonary arteries                         Value        Reference  PA pressure, S, DP                  (H)    32    mm Hg  <=30    Tricuspid valve                            Value        Reference  Tricuspid  regurg peak velocity             268   cm/s   ---------  Tricuspid peak RV-RA gradient              29    mm  Hg  ---------    Right atrium                               Value        Reference  RA ID, S-I, ES, A4C                        46.3  mm     34 - 49  RA area, ES, A4C                           13.4  cm^2   8.3 -                                                          19.5  RA volume, ES, A/L                         32.1  ml     ---------  RA volume/bsa, ES, A/L                     20.3  ml/m^2 ---------    Systemic veins                             Value        Reference  Estimated CVP                              3     mm Hg  ---------    Right ventricle                            Value        Reference  TAPSE                                      14.5  mm     ---------  RV pressure, S, DP                  (H)    32    mm Hg  <=30  RV s&', lateral, S                          10.7  cm/s   ---------  Legend: (L)  and  (H)  mark values outside specified reference range.  ------------------------------------------------------------------- Prepared and Electronically Authenticated by  Kristeen Miss, M.D. 2019-10-30T11:31:57    Assessment / Plan: Severe multivessel coronary artery disease status post non-STEMI. 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   Rowe Clack, PA-C 09/04/2018 2:11 PM Pager [phone]  Patient examined, images of  coronary angiogram and echocardiogram and chest x-ray personally reviewed. 72 year old chronically ill single male smoker presents with non-STEMI, fairly well preserved LV function, and severe  three-vessel coronary artery disease as well as peripheral vascular disease of the lower extremities.  He has been in chronic atrial fibrillation and has had a previous embolic stroke from which he has recovered.  He has COPD and is an active smoker.  PCI would probably not benefit the patient because of coronary anatomy and tortuous vessels.  He appears to have adequate but not optimal targets of the LAD RCA and OM.  The patient is fairly debilitated and states he has lost 25 pounds over the past few months.  He is still able to ambulate fairly well as I walked with him out of his room up and down the hallway.  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 In 2016 he had a 1.5 cm groundglass density in the left upper lobe by CT and that will be reexamined.  His PFTs are adequate for sternotomy.  His carotid disease is mild-moderate.  His ABI is 0.5 on the right leg.  I have discussed in detail the procedure of CABG including the surgical incision, the expected hospital recovery, potential postoperative problems including pulmonary, wound healing and infection, stroke, and death.  He understands and agrees to proceed with CABG which will be at increased risk because of his comorbid medical problems.  Lovett Sox MD

## 2018-09-04 NOTE — Progress Notes (Signed)
Pre-op Cardiac Surgery  Carotid Findings:  Rt ICA 1-39% Lt ICA 40-59%  Upper Extremity Right Left  Brachial Pressures 149 148  Radial Waveforms triphasic triphasic  Ulnar Waveforms triphasic triphasic  Palmar Arch (Allen's Test) Radial: obliterate w/ comp Ulnar: decrease 50% w/ comp Radial: WNL Ulnar: WNL     Lower  Extremity Right Left  Dorsalis Pedis Unable to obtain Unable to obtain      Posterior Tibial Dampened monophasic monophasic  Ankle/Brachial Indices  0.5     Blanch Media 09/04/2018 3:43 PM

## 2018-09-04 NOTE — Progress Notes (Signed)
ANTICOAGULATION CONSULT NOTE - Follow Up Consult  Pharmacy Consult for Heparin Indication: chest pain/ACS 3V CAD plan CABG  No Known Allergies  Patient Measurements: Height: 5\' 8"  (172.7 cm) Weight: 115 lb 8 oz (52.4 kg) IBW/kg (Calculated) : 68.4  Heparin Dosing Wt: 52.4 kg  Vital Signs: Temp: 97.4 F (36.3 C) (10/31 1313) Temp Source: Oral (10/31 1313) BP: 130/61 (10/31 1313) Pulse Rate: 42 (10/31 1313)  Labs: Recent Labs    09/03/18 0300 09/03/18 0838 09/03/18 1044 09/03/18 1912 09/04/18 0507 09/04/18 1254  HGB 13.0  --   --   --  11.6*  --   HCT 39.3  --   --   --  35.1*  --   PLT 199  --   --   --  204  --   HEPARINUNFRC  --   --   --   --  <0.10* <0.10*  CREATININE 1.03  --   --   --   --   --   TROPONINI  --  0.63* 0.60* 0.57*  --   --     Estimated Creatinine Clearance: 49.5 mL/min (by C-G formula based on SCr of 1.03 mg/dL).   Assessment: 72 y.o. male with NSTEMI, now s/p cath, found to have 3V CAD. CVTS consulted for CABG work-up. Heparin resumed post-cath. Heparin level remains undetectable this afternoon after rate increase. Hg down to 11.6, plt wnl. No bleeding or issues with infusion per discussion with RN.  Goal of Therapy:  Heparin level 0.3-0.7 units/ml Monitor platelets by anticoagulation protocol: Yes   Plan:  Increase heparin drip to 1000 units/hr 6h heparin level Monitor daily heparin level and CBC, s/sx bleeding F/u CVTS plans  Babs Bertin, PharmD, BCPS Clinical Pharmacist Clinical phone [phone] Please check AMION for all [facility] contact numbers 09/04/2018 1:46 PM

## 2018-09-04 NOTE — Progress Notes (Signed)
ANTICOAGULATION CONSULT NOTE - Follow Up Consult  Pharmacy Consult for Heparin Indication: chest pain/ACS 3V CAD plan CABG  No Known Allergies  Patient Measurements: Height: 5\' 8"  (172.7 cm) Weight: 115 lb 8 oz (52.4 kg) IBW/kg (Calculated) : 68.4  Heparin Dosing Wt: 52.4 kg  Vital Signs: Temp: 97.4 F (36.3 C) (10/31 2030) Temp Source: Oral (10/31 2030) BP: 138/73 (10/31 2030) Pulse Rate: 81 (10/31 2030)  Labs: Recent Labs    09/03/18 0300 09/03/18 0838 09/03/18 1044 09/03/18 1912 09/04/18 0507 09/04/18 1254 09/04/18 1503 09/04/18 2115  HGB 13.0  --   --   --  11.6*  --   --   --   HCT 39.3  --   --   --  35.1*  --   --   --   PLT 199  --   --   --  204  --   --   --   LABPROT  --   --   --   --   --   --  13.9  --   INR  --   --   --   --   --   --  1.08  --   HEPARINUNFRC  --   --   --   --  <0.10* <0.10*  --  0.24*  CREATININE 1.03  --   --   --   --   --  1.07  --   TROPONINI  --  0.63* 0.60* 0.57*  --   --   --   --     Estimated Creatinine Clearance: 47.6 mL/min (by C-G formula based on SCr of 1.07 mg/dL).   Assessment: 72 y.o. male with NSTEMI, now s/p cath, found to have 3V CAD. Heparin level subtherapeutic (0.24) on gtt at 1000 units/hr. No issues with line or bleeding reported per RN.  Pt for CABG tomorrow.  Goal of Therapy:  Heparin level 0.3-0.7 units/ml Monitor platelets by anticoagulation protocol: Yes   Plan:  Increase heparin drip to 1100 units/hr Pt for CABG tomorrow  Christoper Fabian, PharmD, BCPS Clinical pharmacist  **Pharmacist phone directory can now be found on [email or web address] ([email or web address]).  Listed under [facility]. 09/04/2018 10:24 PM

## 2018-09-04 NOTE — Care Management Note (Addendum)
Case Management Note  Patient Details  Name: Joe Durham MRN: [id number] Date of Birth: [birth year]  Subjective/Objective: Pt presented for Nstemi- PTA from home alone- pt has RW, and Cane. Patient states he lives in an apartment on 2nd floor and has to go up 17 stairs. Patient states he was seeing urology:  Dr Modena Slater with [facility] will need follow up appointment scheduled. Patient stated he missed his last appointment for urology follow up-Pt has a suprapubic catheter.                  Action/Plan: CVTS following for CABG- hx of 3V CAD from Cath. CM will continue to monitor for additional needs.   Expected Discharge Date:                  Expected Discharge Plan:  Home w Home Health Services  In-House Referral:     Discharge planning Services  CM Consult  Post Acute Care Choice:    Choice offered to:     DME Arranged:    DME Agency:     HH Arranged:    HH Agency:     Status of Service:  In process, will continue to follow  If discussed at Long Length of Stay Meetings, dates discussed:    Additional Comments:  Gala Lewandowsky, RN 09/04/2018, 2:10 PM

## 2018-09-04 NOTE — Progress Notes (Signed)
ANTICOAGULATION CONSULT NOTE - Follow Up Consult  Pharmacy Consult for Heparin Indication: chest pain/ACS 3V CAD plan CABG  No Known Allergies  Patient Measurements: Height: 5\' 8"  (172.7 cm) Weight: 115 lb 8 oz (52.4 kg) IBW/kg (Calculated) : 68.4  Vital Signs: Temp: 97.3 F (36.3 C) (10/31 0516) Temp Source: Oral (10/31 0516) BP: 141/85 (10/31 0516) Pulse Rate: 42 (10/31 0516)  Labs: Recent Labs    09/03/18 0300 09/03/18 0838 09/03/18 1044 09/03/18 1912 09/04/18 0507  HGB 13.0  --   --   --  11.6*  HCT 39.3  --   --   --  35.1*  PLT 199  --   --   --  204  HEPARINUNFRC  --   --   --   --  <0.10*  CREATININE 1.03  --   --   --   --   TROPONINI  --  0.63* 0.60* 0.57*  --     Estimated Creatinine Clearance: 49.5 mL/min (by C-G formula based on SCr of 1.03 mg/dL).   Assessment: 72 y.o. male with NSTEMI went to cath lab today has 3V CAD> plan for CABG Plan to restart heparin drip 8hr after sheath out - out 1600 start at MN. Heparin level this am <0.1 units/ml  Goal of Therapy:  Heparin level 0.3-0.7 units/ml Monitor platelets by anticoagulation protocol: Yes   Plan:  Increase heparin drip to 800 units/hr Check heparin level later today  Thanks for allowing pharmacy to be a part of this patient's care.  Talbert Cage, PharmD Clinical Pharmacist 09/04/2018 7:14 AM

## 2018-09-05 ENCOUNTER — Inpatient Hospital Stay (HOSPITAL_COMMUNITY): Payer: Medicare Other | Admitting: Anesthesiology

## 2018-09-05 ENCOUNTER — Inpatient Hospital Stay (HOSPITAL_COMMUNITY): Payer: Medicare Other

## 2018-09-05 ENCOUNTER — Encounter (HOSPITAL_COMMUNITY): Payer: Self-pay | Admitting: Certified Registered Nurse Anesthetist

## 2018-09-05 ENCOUNTER — Encounter (HOSPITAL_COMMUNITY)
Admission: EM | Disposition: A | Discharge status: Skilled Nursing Facility | Payer: Self-pay | Source: Home / Self Care | Attending: Cardiothoracic Surgery

## 2018-09-05 DIAGNOSIS — I214 Non-ST elevation (NSTEMI) myocardial infarction: Secondary | ICD-10-CM

## 2018-09-05 DIAGNOSIS — I25110 Atherosclerotic heart disease of native coronary artery with unstable angina pectoris: Secondary | ICD-10-CM

## 2018-09-05 DIAGNOSIS — I2510 Atherosclerotic heart disease of native coronary artery without angina pectoris: Secondary | ICD-10-CM

## 2018-09-05 DIAGNOSIS — Z951 Presence of aortocoronary bypass graft: Secondary | ICD-10-CM

## 2018-09-05 HISTORY — PX: CORONARY ARTERY BYPASS GRAFT: SHX141

## 2018-09-05 HISTORY — PX: TEE WITHOUT CARDIOVERSION: SHX5443

## 2018-09-05 LAB — POCT I-STAT 3, ART BLOOD GAS (G3+)
ACID-BASE DEFICIT: 2 mmol/L (ref 0.0–2.0)
ACID-BASE EXCESS: 3 mmol/L — AB (ref 0.0–2.0)
Acid-Base Excess: 4 mmol/L — ABNORMAL HIGH (ref 0.0–2.0)
Acid-base deficit: 1 mmol/L (ref 0.0–2.0)
Acid-base deficit: 3 mmol/L — ABNORMAL HIGH (ref 0.0–2.0)
Acid-base deficit: 2 mmol/L (ref 0.0–2.0)
BICARBONATE: 23.1 mmol/L (ref 20.0–28.0)
BICARBONATE: 27.3 mmol/L (ref 20.0–28.0)
BICARBONATE: 20.4 mmol/L (ref 20.0–28.0)
BICARBONATE: 27.7 mmol/L (ref 20.0–28.0)
Bicarbonate: 23.3 mmol/L (ref 20.0–28.0)
Bicarbonate: 24.8 mmol/L (ref 20.0–28.0)
Bicarbonate: 25.6 mmol/L (ref 20.0–28.0)
Bicarbonate: 24.4 mmol/L (ref 20.0–28.0)
O2 SAT: 100 %
O2 SAT: 98 %
O2 SAT: 99 %
O2 SAT: 98 %
O2 SAT: 98 %
O2 Saturation: 100 %
O2 Saturation: 100 %
O2 Saturation: 100 %
PCO2 ART: 36.6 mmHg (ref 32.0–48.0)
PCO2 ART: 44.8 mmHg (ref 32.0–48.0)
PCO2 ART: 45.6 mmHg (ref 32.0–48.0)
PH ART: 7.412 (ref 7.350–7.450)
PH ART: 7.397 (ref 7.350–7.450)
PH ART: 7.48 — AB (ref 7.350–7.450)
PH ART: 7.485 — AB (ref 7.350–7.450)
PO2 ART: 323 mmHg — AB (ref 83.0–108.0)
PO2 ART: 114 mmHg — AB (ref 83.0–108.0)
Patient temperature: 35.2
Patient temperature: 36.7
TCO2: 24 mmol/L (ref 22–32)
TCO2: 26 mmol/L (ref 22–32)
TCO2: 24 mmol/L (ref 22–32)
TCO2: 28 mmol/L (ref 22–32)
TCO2: 21 mmol/L — AB (ref 22–32)
TCO2: 29 mmol/L (ref 22–32)
TCO2: 27 mmol/L (ref 22–32)
TCO2: 26 mmol/L (ref 22–32)
pCO2 arterial: 39 mmHg (ref 32.0–48.0)
pCO2 arterial: 40.2 mmHg (ref 32.0–48.0)
pCO2 arterial: 37.7 mmHg (ref 32.0–48.0)
pCO2 arterial: 26.6 mmHg — ABNORMAL LOW (ref 32.0–48.0)
pCO2 arterial: 39 mmHg (ref 32.0–48.0)
pH, Arterial: 7.371 (ref 7.350–7.450)
pH, Arterial: 7.457 — ABNORMAL HIGH (ref 7.350–7.450)
pH, Arterial: 7.362 (ref 7.350–7.450)
pH, Arterial: 7.335 — ABNORMAL LOW (ref 7.350–7.450)
pO2, Arterial: 268 mmHg — ABNORMAL HIGH (ref 83.0–108.0)
pO2, Arterial: 434 mmHg — ABNORMAL HIGH (ref 83.0–108.0)
pO2, Arterial: 351 mmHg — ABNORMAL HIGH (ref 83.0–108.0)
pO2, Arterial: 94 mmHg (ref 83.0–108.0)
pO2, Arterial: 147 mmHg — ABNORMAL HIGH (ref 83.0–108.0)
pO2, Arterial: 115 mmHg — ABNORMAL HIGH (ref 83.0–108.0)

## 2018-09-05 LAB — POCT I-STAT, CHEM 8
BUN: 5 mg/dL — AB (ref 8–23)
BUN: 4 mg/dL — AB (ref 8–23)
BUN: 4 mg/dL — ABNORMAL LOW (ref 8–23)
BUN: 4 mg/dL — ABNORMAL LOW (ref 8–23)
BUN: 4 mg/dL — AB (ref 8–23)
BUN: 4 mg/dL — AB (ref 8–23)
CALCIUM ION: 1.04 mmol/L — AB (ref 1.15–1.40)
CALCIUM ION: 1.23 mmol/L (ref 1.15–1.40)
CHLORIDE: 106 mmol/L (ref 98–111)
CHLORIDE: 104 mmol/L (ref 98–111)
CHLORIDE: 99 mmol/L (ref 98–111)
CHLORIDE: 101 mmol/L (ref 98–111)
CHLORIDE: 107 mmol/L (ref 98–111)
CREATININE: 0.6 mg/dL — AB (ref 0.61–1.24)
CREATININE: 0.7 mg/dL (ref 0.61–1.24)
CREATININE: 0.8 mg/dL (ref 0.61–1.24)
Calcium, Ion: 1.18 mmol/L (ref 1.15–1.40)
Calcium, Ion: 1.1 mmol/L — ABNORMAL LOW (ref 1.15–1.40)
Calcium, Ion: 1.02 mmol/L — ABNORMAL LOW (ref 1.15–1.40)
Calcium, Ion: 1.18 mmol/L (ref 1.15–1.40)
Chloride: 100 mmol/L (ref 98–111)
Creatinine, Ser: 0.6 mg/dL — ABNORMAL LOW (ref 0.61–1.24)
Creatinine, Ser: 0.5 mg/dL — ABNORMAL LOW (ref 0.61–1.24)
Creatinine, Ser: 0.6 mg/dL — ABNORMAL LOW (ref 0.61–1.24)
GLUCOSE: 102 mg/dL — AB (ref 70–99)
GLUCOSE: 96 mg/dL (ref 70–99)
GLUCOSE: 97 mg/dL (ref 70–99)
GLUCOSE: 89 mg/dL (ref 70–99)
Glucose, Bld: 86 mg/dL (ref 70–99)
Glucose, Bld: 117 mg/dL — ABNORMAL HIGH (ref 70–99)
HCT: 28 % — ABNORMAL LOW (ref 39.0–52.0)
HCT: 30 % — ABNORMAL LOW (ref 39.0–52.0)
HCT: 27 % — ABNORMAL LOW (ref 39.0–52.0)
HEMATOCRIT: 28 % — AB (ref 39.0–52.0)
HEMATOCRIT: 23 % — AB (ref 39.0–52.0)
HEMATOCRIT: 23 % — AB (ref 39.0–52.0)
HEMOGLOBIN: 9.5 g/dL — AB (ref 13.0–17.0)
HEMOGLOBIN: 7.8 g/dL — AB (ref 13.0–17.0)
HEMOGLOBIN: 7.8 g/dL — AB (ref 13.0–17.0)
Hemoglobin: 9.5 g/dL — ABNORMAL LOW (ref 13.0–17.0)
Hemoglobin: 10.2 g/dL — ABNORMAL LOW (ref 13.0–17.0)
Hemoglobin: 9.2 g/dL — ABNORMAL LOW (ref 13.0–17.0)
POTASSIUM: 3.8 mmol/L (ref 3.5–5.1)
POTASSIUM: 4.6 mmol/L (ref 3.5–5.1)
POTASSIUM: 4.5 mmol/L (ref 3.5–5.1)
POTASSIUM: 4.4 mmol/L (ref 3.5–5.1)
Potassium: 3.5 mmol/L (ref 3.5–5.1)
Potassium: 3.7 mmol/L (ref 3.5–5.1)
SODIUM: 139 mmol/L (ref 135–145)
Sodium: 136 mmol/L (ref 135–145)
Sodium: 137 mmol/L (ref 135–145)
Sodium: 134 mmol/L — ABNORMAL LOW (ref 135–145)
Sodium: 137 mmol/L (ref 135–145)
Sodium: 139 mmol/L (ref 135–145)
TCO2: 24 mmol/L (ref 22–32)
TCO2: 25 mmol/L (ref 22–32)
TCO2: 25 mmol/L (ref 22–32)
TCO2: 26 mmol/L (ref 22–32)
TCO2: 29 mmol/L (ref 22–32)
TCO2: 26 mmol/L (ref 22–32)

## 2018-09-05 LAB — APTT: aPTT: 34 seconds (ref 24–36)

## 2018-09-05 LAB — CBC
HCT: 28.2 % — ABNORMAL LOW (ref 39.0–52.0)
HCT: 28.5 % — ABNORMAL LOW (ref 39.0–52.0)
HEMATOCRIT: 39.2 % (ref 39.0–52.0)
HEMOGLOBIN: 12.4 g/dL — AB (ref 13.0–17.0)
Hemoglobin: 9.6 g/dL — ABNORMAL LOW (ref 13.0–17.0)
Hemoglobin: 9.4 g/dL — ABNORMAL LOW (ref 13.0–17.0)
MCH: 33.6 pg (ref 26.0–34.0)
MCH: 34.3 pg — ABNORMAL HIGH (ref 26.0–34.0)
MCH: 34.1 pg — ABNORMAL HIGH (ref 26.0–34.0)
MCHC: 31.6 g/dL (ref 30.0–36.0)
MCHC: 34 g/dL (ref 30.0–36.0)
MCHC: 33 g/dL (ref 30.0–36.0)
MCV: 106.2 fL — ABNORMAL HIGH (ref 80.0–100.0)
MCV: 100.7 fL — ABNORMAL HIGH (ref 80.0–100.0)
MCV: 103.3 fL — ABNORMAL HIGH (ref 80.0–100.0)
Platelets: 238 10*3/uL (ref 150–400)
Platelets: 124 10*3/uL — ABNORMAL LOW (ref 150–400)
Platelets: 138 10*3/uL — ABNORMAL LOW (ref 150–400)
RBC: 3.69 MIL/uL — ABNORMAL LOW (ref 4.22–5.81)
RBC: 2.8 MIL/uL — ABNORMAL LOW (ref 4.22–5.81)
RBC: 2.76 MIL/uL — ABNORMAL LOW (ref 4.22–5.81)
RDW: 12.8 % (ref 11.5–15.5)
RDW: 15.7 % — ABNORMAL HIGH (ref 11.5–15.5)
RDW: 16.1 % — ABNORMAL HIGH (ref 11.5–15.5)
WBC: 8.2 10*3/uL (ref 4.0–10.5)
WBC: 13.1 10*3/uL — AB (ref 4.0–10.5)
WBC: 12.3 10*3/uL — ABNORMAL HIGH (ref 4.0–10.5)
nRBC: 0 % (ref 0.0–0.2)
nRBC: 0 % (ref 0.0–0.2)
nRBC: 0 % (ref 0.0–0.2)

## 2018-09-05 LAB — PROTIME-INR
INR: 1.46
PROTHROMBIN TIME: 17.6 s — AB (ref 11.4–15.2)

## 2018-09-05 LAB — BASIC METABOLIC PANEL
Anion gap: 6 (ref 5–15)
BUN: 7 mg/dL — ABNORMAL LOW (ref 8–23)
CO2: 26 mmol/L (ref 22–32)
Calcium: 9.2 mg/dL (ref 8.9–10.3)
Chloride: 104 mmol/L (ref 98–111)
Creatinine, Ser: 0.99 mg/dL (ref 0.61–1.24)
GFR calc Af Amer: >60 mL/min (ref >60)
GFR calc non Af Amer: >60 mL/min (ref >60)
Glucose, Bld: 92 mg/dL (ref 70–99)
Potassium: 4.1 mmol/L (ref 3.5–5.1)
Sodium: 136 mmol/L (ref 135–145)

## 2018-09-05 LAB — GLUCOSE, CAPILLARY
GLUCOSE-CAPILLARY: 91 mg/dL (ref 70–99)
GLUCOSE-CAPILLARY: 119 mg/dL — AB (ref 70–99)
GLUCOSE-CAPILLARY: 118 mg/dL — AB (ref 70–99)
GLUCOSE-CAPILLARY: 114 mg/dL — AB (ref 70–99)
Glucose-Capillary: 108 mg/dL — ABNORMAL HIGH (ref 70–99)
Glucose-Capillary: 117 mg/dL — ABNORMAL HIGH (ref 70–99)

## 2018-09-05 LAB — HEMOGLOBIN AND HEMATOCRIT, BLOOD
HCT: 25.4 % — ABNORMAL LOW (ref 39.0–52.0)
Hemoglobin: 8.5 g/dL — ABNORMAL LOW (ref 13.0–17.0)

## 2018-09-05 LAB — POCT I-STAT 4, (NA,K, GLUC, HGB,HCT)
Glucose, Bld: 119 mg/dL — ABNORMAL HIGH (ref 70–99)
HCT: 27 % — ABNORMAL LOW (ref 39.0–52.0)
Hemoglobin: 9.2 g/dL — ABNORMAL LOW (ref 13.0–17.0)
Potassium: 4 mmol/L (ref 3.5–5.1)
Sodium: 139 mmol/L (ref 135–145)

## 2018-09-05 LAB — MAGNESIUM: Magnesium: 3.4 mg/dL — ABNORMAL HIGH (ref 1.7–2.4)

## 2018-09-05 LAB — CREATININE, SERUM
Creatinine, Ser: 0.85 mg/dL (ref 0.61–1.24)
GFR calc Af Amer: >60 mL/min (ref >60)
GFR calc non Af Amer: >60 mL/min (ref >60)

## 2018-09-05 LAB — HEPARIN LEVEL (UNFRACTIONATED): HEPARIN UNFRACTIONATED: 0.37 [IU]/mL (ref 0.30–0.70)

## 2018-09-05 LAB — PLATELET COUNT: Platelets: 140 10*3/uL — ABNORMAL LOW (ref 150–400)

## 2018-09-05 LAB — PREPARE RBC (CROSSMATCH)

## 2018-09-05 SURGERY — CORONARY ARTERY BYPASS GRAFTING (CABG)
Anesthesia: General | Site: Chest

## 2018-09-05 MED ORDER — MORPHINE SULFATE (PF) 2 MG/ML IV SOLN
1.0000 mg | INTRAVENOUS | Status: AC | PRN
  Administered 2018-09-05: 1 mg via INTRAVENOUS
  Administered 2018-09-05: 2 mg via INTRAVENOUS
  Filled 2018-09-05: qty 2

## 2018-09-05 MED ORDER — HEMOSTATIC AGENTS (NO CHARGE) OPTIME
TOPICAL | Status: DC | PRN
  Administered 2018-09-05: 1 via TOPICAL
  Administered 2018-09-05: 2 via TOPICAL

## 2018-09-05 MED ORDER — SODIUM CHLORIDE 0.9 % IV SOLN: 250.0000 mL | INTRAVENOUS | Status: DC

## 2018-09-05 MED ORDER — METOPROLOL TARTRATE 25 MG/10 ML ORAL SUSPENSION: 12.5000 mg | Freq: Two times a day (BID) | ORAL | Status: DC

## 2018-09-05 MED ORDER — NITROGLYCERIN 0.2 MG/ML ON CALL CATH LAB
INTRAVENOUS | Status: DC | PRN
  Administered 2018-09-05: 20 ug via INTRAVENOUS

## 2018-09-05 MED ORDER — INSULIN REGULAR BOLUS VIA INFUSION
0.0000 [IU] | Freq: Three times a day (TID) | INTRAVENOUS | Status: DC
  Filled 2018-09-05: qty 10

## 2018-09-05 MED ORDER — FENTANYL CITRATE (PF) 250 MCG/5ML IJ SOLN
INTRAMUSCULAR | Status: DC | PRN
  Administered 2018-09-05 (×2): 100 ug via INTRAVENOUS
  Administered 2018-09-05 (×2): 50 ug via INTRAVENOUS
  Administered 2018-09-05: 100 ug via INTRAVENOUS
  Administered 2018-09-05: 150 ug via INTRAVENOUS
  Administered 2018-09-05 (×4): 100 ug via INTRAVENOUS
  Administered 2018-09-05 (×2): 150 ug via INTRAVENOUS

## 2018-09-05 MED ORDER — ASPIRIN EC 325 MG PO TBEC
325.0000 mg | DELAYED_RELEASE_TABLET | Freq: Every day | ORAL | Status: DC
  Administered 2018-09-06 – 2018-09-12 (×7): 325 mg via ORAL
  Filled 2018-09-05 (×7): qty 1

## 2018-09-05 MED ORDER — FENTANYL CITRATE (PF) 250 MCG/5ML IJ SOLN
INTRAMUSCULAR | Status: AC
  Filled 2018-09-05: qty 25

## 2018-09-05 MED ORDER — ARTIFICIAL TEARS OPHTHALMIC OINT
TOPICAL_OINTMENT | OPHTHALMIC | Status: AC
  Filled 2018-09-05: qty 3.5

## 2018-09-05 MED ORDER — ASPIRIN 81 MG PO CHEW: 324.0000 mg | CHEWABLE_TABLET | Freq: Every day | ORAL | Status: DC

## 2018-09-05 MED ORDER — ACETAMINOPHEN 160 MG/5ML PO SOLN: 1000.0000 mg | Freq: Four times a day (QID) | ORAL | Status: DC

## 2018-09-05 MED ORDER — LACTATED RINGERS IV SOLN
INTRAVENOUS | Status: DC | PRN
  Administered 2018-09-05: 07:00:00 via INTRAVENOUS

## 2018-09-05 MED ORDER — ROCURONIUM BROMIDE 50 MG/5ML IV SOSY
PREFILLED_SYRINGE | INTRAVENOUS | Status: AC
  Filled 2018-09-05: qty 5

## 2018-09-05 MED ORDER — ARTIFICIAL TEARS OPHTHALMIC OINT
TOPICAL_OINTMENT | OPHTHALMIC | Status: DC | PRN
  Administered 2018-09-05: 1 via OPHTHALMIC

## 2018-09-05 MED ORDER — PANTOPRAZOLE SODIUM 40 MG PO TBEC
40.0000 mg | DELAYED_RELEASE_TABLET | Freq: Every day | ORAL | Status: DC
  Administered 2018-09-07 – 2018-09-12 (×5): 40 mg via ORAL
  Filled 2018-09-05 (×5): qty 1

## 2018-09-05 MED ORDER — DOCUSATE SODIUM 100 MG PO CAPS
200.0000 mg | ORAL_CAPSULE | Freq: Every day | ORAL | Status: DC
  Administered 2018-09-06 – 2018-09-10 (×4): 200 mg via ORAL
  Filled 2018-09-05 (×6): qty 2

## 2018-09-05 MED ORDER — CHLORHEXIDINE GLUCONATE 0.12 % MT SOLN
15.0000 mL | OROMUCOSAL | Status: AC
  Administered 2018-09-05: 15 mL via OROMUCOSAL

## 2018-09-05 MED ORDER — ACETAMINOPHEN 160 MG/5ML PO SOLN: 650.0000 mg | Freq: Once | ORAL | Status: AC

## 2018-09-05 MED ORDER — SODIUM CHLORIDE 0.9% FLUSH
3.0000 mL | Freq: Two times a day (BID) | INTRAVENOUS | Status: DC
  Administered 2018-09-07 – 2018-09-09 (×5): 3 mL via INTRAVENOUS

## 2018-09-05 MED ORDER — PROPOFOL 10 MG/ML IV BOLUS
INTRAVENOUS | Status: AC
  Filled 2018-09-05: qty 20

## 2018-09-05 MED ORDER — METOPROLOL TARTRATE 5 MG/5ML IV SOLN
2.5000 mg | INTRAVENOUS | Status: DC | PRN
  Administered 2018-09-05: 5 mg via INTRAVENOUS

## 2018-09-05 MED ORDER — MIDAZOLAM HCL 2 MG/2ML IJ SOLN
2.0000 mg | INTRAMUSCULAR | Status: DC | PRN
  Administered 2018-09-05: 2 mg via INTRAVENOUS
  Filled 2018-09-05: qty 2

## 2018-09-05 MED ORDER — SODIUM CHLORIDE 0.9% FLUSH: 10.0000 mL | INTRAVENOUS | Status: DC | PRN

## 2018-09-05 MED ORDER — SODIUM BICARBONATE 8.4 % IV SOLN
25.0000 meq | Freq: Once | INTRAVENOUS | Status: AC
  Administered 2018-09-05: 25 meq via INTRAVENOUS

## 2018-09-05 MED ORDER — ALBUMIN HUMAN 5 % IV SOLN
250.0000 mL | INTRAVENOUS | Status: DC | PRN
  Administered 2018-09-05 (×3): 12.5 g via INTRAVENOUS
  Filled 2018-09-05: qty 250

## 2018-09-05 MED ORDER — VANCOMYCIN HCL IN DEXTROSE 1-5 GM/200ML-% IV SOLN
1000.0000 mg | Freq: Once | INTRAVENOUS | Status: AC
  Administered 2018-09-05: 1000 mg via INTRAVENOUS
  Filled 2018-09-05: qty 200

## 2018-09-05 MED ORDER — PROPOFOL 10 MG/ML IV BOLUS
INTRAVENOUS | Status: DC | PRN
  Administered 2018-09-05: 60 mg via INTRAVENOUS

## 2018-09-05 MED ORDER — MIDAZOLAM HCL 10 MG/2ML IJ SOLN
INTRAMUSCULAR | Status: AC
  Filled 2018-09-05: qty 2

## 2018-09-05 MED ORDER — POTASSIUM CHLORIDE 10 MEQ/50ML IV SOLN: 10.0000 meq | INTRAVENOUS | Status: AC

## 2018-09-05 MED ORDER — LACTATED RINGERS IV SOLN
INTRAVENOUS | Status: DC | PRN
  Administered 2018-09-05 (×2): via INTRAVENOUS

## 2018-09-05 MED ORDER — LEVALBUTEROL HCL 0.63 MG/3ML IN NEBU
0.6300 mg | INHALATION_SOLUTION | Freq: Three times a day (TID) | RESPIRATORY_TRACT | Status: DC
  Administered 2018-09-05 (×2): 0.63 mg via RESPIRATORY_TRACT
  Filled 2018-09-05 (×2): qty 3

## 2018-09-05 MED ORDER — ORAL CARE MOUTH RINSE
15.0000 mL | OROMUCOSAL | Status: DC
  Administered 2018-09-05: 15 mL via OROMUCOSAL

## 2018-09-05 MED ORDER — FAMOTIDINE IN NACL 20-0.9 MG/50ML-% IV SOLN
20.0000 mg | Freq: Two times a day (BID) | INTRAVENOUS | Status: DC
  Administered 2018-09-05: 20 mg via INTRAVENOUS
  Filled 2018-09-05 (×2): qty 50

## 2018-09-05 MED ORDER — BISACODYL 5 MG PO TBEC
10.0000 mg | DELAYED_RELEASE_TABLET | Freq: Every day | ORAL | Status: DC
  Administered 2018-09-06 – 2018-09-10 (×4): 10 mg via ORAL
  Filled 2018-09-05 (×6): qty 2

## 2018-09-05 MED ORDER — SODIUM CHLORIDE 0.9 % IV SOLN
INTRAVENOUS | Status: DC | PRN
  Administered 2018-09-05: 13:00:00 via INTRAVENOUS

## 2018-09-05 MED ORDER — ACETAMINOPHEN 500 MG PO TABS
1000.0000 mg | ORAL_TABLET | Freq: Four times a day (QID) | ORAL | Status: AC
  Administered 2018-09-05 – 2018-09-10 (×17): 1000 mg via ORAL
  Filled 2018-09-05 (×19): qty 2

## 2018-09-05 MED ORDER — ALBUMIN HUMAN 5 % IV SOLN
INTRAVENOUS | Status: DC | PRN
  Administered 2018-09-05: 12:00:00 via INTRAVENOUS

## 2018-09-05 MED ORDER — NITROGLYCERIN IN D5W 200-5 MCG/ML-% IV SOLN: 0.0000 ug/min | INTRAVENOUS | Status: DC

## 2018-09-05 MED ORDER — ROCURONIUM BROMIDE 50 MG/5ML IV SOSY
PREFILLED_SYRINGE | INTRAVENOUS | Status: AC
  Filled 2018-09-05: qty 15

## 2018-09-05 MED ORDER — BISACODYL 10 MG RE SUPP: 10.0000 mg | Freq: Every day | RECTAL | Status: DC

## 2018-09-05 MED ORDER — SODIUM CHLORIDE 0.9 % IV SOLN
1.5000 g | Freq: Two times a day (BID) | INTRAVENOUS | Status: AC
  Administered 2018-09-05 – 2018-09-07 (×4): 1.5 g via INTRAVENOUS
  Filled 2018-09-05 (×4): qty 1.5

## 2018-09-05 MED ORDER — METOCLOPRAMIDE HCL 5 MG/ML IJ SOLN
10.0000 mg | Freq: Four times a day (QID) | INTRAMUSCULAR | Status: DC
  Administered 2018-09-05 – 2018-09-06 (×4): 10 mg via INTRAVENOUS
  Filled 2018-09-05 (×4): qty 2

## 2018-09-05 MED ORDER — ACETAMINOPHEN 650 MG RE SUPP
650.0000 mg | Freq: Once | RECTAL | Status: AC
  Administered 2018-09-05: 650 mg via RECTAL

## 2018-09-05 MED ORDER — LACTATED RINGERS IV SOLN: 500.0000 mL | Freq: Once | INTRAVENOUS | Status: DC | PRN

## 2018-09-05 MED ORDER — HEPARIN SODIUM (PORCINE) 1000 UNIT/ML IJ SOLN
INTRAMUSCULAR | Status: AC
  Filled 2018-09-05: qty 1

## 2018-09-05 MED ORDER — SODIUM CHLORIDE 0.9% FLUSH: 3.0000 mL | INTRAVENOUS | Status: DC | PRN

## 2018-09-05 MED ORDER — HEPARIN SODIUM (PORCINE) 1000 UNIT/ML IJ SOLN
INTRAMUSCULAR | Status: DC | PRN
  Administered 2018-09-05: 2000 [IU] via INTRAVENOUS
  Administered 2018-09-05: 5000 [IU] via INTRAVENOUS
  Administered 2018-09-05: 16000 [IU] via INTRAVENOUS
  Administered 2018-09-05: 2000 [IU] via INTRAVENOUS

## 2018-09-05 MED ORDER — SODIUM CHLORIDE 0.9 % IV SOLN
INTRAVENOUS | Status: DC | PRN
  Administered 2018-09-05: 750 mg via INTRAVENOUS

## 2018-09-05 MED ORDER — SODIUM CHLORIDE 0.9% IV SOLUTION: Freq: Once | INTRAVENOUS | Status: DC

## 2018-09-05 MED ORDER — LACTATED RINGERS IV SOLN
INTRAVENOUS | Status: DC
  Administered 2018-09-08: 19:00:00 via INTRAVENOUS

## 2018-09-05 MED ORDER — PHENYLEPHRINE HCL-NACL 20-0.9 MG/250ML-% IV SOLN
0.0000 ug/min | INTRAVENOUS | Status: DC
  Filled 2018-09-05: qty 250

## 2018-09-05 MED ORDER — INSULIN ASPART 100 UNIT/ML ~~LOC~~ SOLN
0.0000 [IU] | SUBCUTANEOUS | Status: DC
  Administered 2018-09-06: 2 [IU] via SUBCUTANEOUS

## 2018-09-05 MED ORDER — NOREPINEPHRINE 4 MG/250ML-% IV SOLN
0.0000 ug/min | INTRAVENOUS | Status: DC
  Filled 2018-09-05: qty 250

## 2018-09-05 MED ORDER — MIDAZOLAM HCL 5 MG/5ML IJ SOLN
INTRAMUSCULAR | Status: DC | PRN
  Administered 2018-09-05: 4 mg via INTRAVENOUS
  Administered 2018-09-05 (×3): 2 mg via INTRAVENOUS

## 2018-09-05 MED ORDER — SODIUM CHLORIDE 0.9% FLUSH
10.0000 mL | Freq: Two times a day (BID) | INTRAVENOUS | Status: DC
  Administered 2018-09-06 – 2018-09-11 (×8): 10 mL

## 2018-09-05 MED ORDER — ONDANSETRON HCL 4 MG/2ML IJ SOLN
4.0000 mg | Freq: Four times a day (QID) | INTRAMUSCULAR | Status: DC | PRN
  Administered 2018-09-06 – 2018-09-07 (×2): 4 mg via INTRAVENOUS
  Filled 2018-09-05 (×2): qty 2

## 2018-09-05 MED ORDER — NITROPRUSSIDE SODIUM-NACL 10-0.9 MG/50ML-% IV SOLN
0.0000 ug/kg/min | INTRAVENOUS | Status: DC
  Filled 2018-09-05: qty 50

## 2018-09-05 MED ORDER — MILRINONE LACTATE IN DEXTROSE 20-5 MG/100ML-% IV SOLN
0.2500 ug/kg/min | INTRAVENOUS | Status: DC
  Administered 2018-09-06: 0.25 ug/kg/min via INTRAVENOUS
  Filled 2018-09-05: qty 100

## 2018-09-05 MED ORDER — DEXMEDETOMIDINE HCL IN NACL 200 MCG/50ML IV SOLN
0.0000 ug/kg/h | INTRAVENOUS | Status: DC
  Filled 2018-09-05: qty 50

## 2018-09-05 MED ORDER — MAGNESIUM SULFATE 4 GM/100ML IV SOLN
4.0000 g | Freq: Once | INTRAVENOUS | Status: AC
  Administered 2018-09-05: 4 g via INTRAVENOUS
  Filled 2018-09-05: qty 100

## 2018-09-05 MED ORDER — TRAMADOL HCL 50 MG PO TABS: 50.0000 mg | ORAL_TABLET | ORAL | Status: DC | PRN

## 2018-09-05 MED ORDER — ORAL CARE MOUTH RINSE
15.0000 mL | Freq: Two times a day (BID) | OROMUCOSAL | Status: DC
  Administered 2018-09-05 – 2018-09-11 (×9): 15 mL via OROMUCOSAL

## 2018-09-05 MED ORDER — SODIUM CHLORIDE 0.9 % IV SOLN
INTRAVENOUS | Status: DC
  Administered 2018-09-05: 14:00:00 via INTRAVENOUS

## 2018-09-05 MED ORDER — MORPHINE SULFATE (PF) 2 MG/ML IV SOLN
2.0000 mg | INTRAVENOUS | Status: DC | PRN
  Administered 2018-09-06 (×2): 2 mg via INTRAVENOUS
  Filled 2018-09-05 (×2): qty 1

## 2018-09-05 MED ORDER — CHLORHEXIDINE GLUCONATE CLOTH 2 % EX PADS
6.0000 | MEDICATED_PAD | Freq: Every day | CUTANEOUS | Status: DC
  Administered 2018-09-05 – 2018-09-07 (×3): 6 via TOPICAL

## 2018-09-05 MED ORDER — LACTATED RINGERS IV SOLN: INTRAVENOUS | Status: DC

## 2018-09-05 MED ORDER — CHLORHEXIDINE GLUCONATE 0.12% ORAL RINSE (MEDLINE KIT)
15.0000 mL | Freq: Two times a day (BID) | OROMUCOSAL | Status: DC
  Administered 2018-09-05: 15 mL via OROMUCOSAL

## 2018-09-05 MED ORDER — PROTAMINE SULFATE 10 MG/ML IV SOLN
INTRAVENOUS | Status: DC | PRN
  Administered 2018-09-05: 220 mg via INTRAVENOUS

## 2018-09-05 MED ORDER — ROCURONIUM BROMIDE 100 MG/10ML IV SOLN
INTRAVENOUS | Status: DC | PRN
  Administered 2018-09-05: 70 mg via INTRAVENOUS
  Administered 2018-09-05 (×2): 50 mg via INTRAVENOUS
  Administered 2018-09-05: 30 mg via INTRAVENOUS

## 2018-09-05 MED ORDER — 0.9 % SODIUM CHLORIDE (POUR BTL) OPTIME
TOPICAL | Status: DC | PRN
  Administered 2018-09-05: 6000 mL

## 2018-09-05 MED ORDER — METOPROLOL TARTRATE 12.5 MG HALF TABLET
12.5000 mg | ORAL_TABLET | Freq: Two times a day (BID) | ORAL | Status: DC
  Administered 2018-09-06: 12.5 mg via ORAL
  Filled 2018-09-05: qty 1

## 2018-09-05 MED ORDER — INSULIN REGULAR(HUMAN) IN NACL 100-0.9 UT/100ML-% IV SOLN: INTRAVENOUS | Status: DC

## 2018-09-05 MED ORDER — OXYCODONE HCL 5 MG PO TABS
5.0000 mg | ORAL_TABLET | ORAL | Status: DC | PRN
  Administered 2018-09-05: 5 mg via ORAL
  Administered 2018-09-06 – 2018-09-10 (×3): 10 mg via ORAL
  Filled 2018-09-05 (×2): qty 2
  Filled 2018-09-05: qty 1
  Filled 2018-09-05: qty 2

## 2018-09-05 MED ORDER — SODIUM CHLORIDE 0.45 % IV SOLN
INTRAVENOUS | Status: DC | PRN
  Administered 2018-09-05: 14:00:00 via INTRAVENOUS

## 2018-09-05 SURGICAL SUPPLY — 104 items
ADAPTER CARDIO PERF ANTE/RETRO (ADAPTER) ×4 IMPLANT
ADH SKN CLS APL DERMABOND .7 (GAUZE/BANDAGES/DRESSINGS) ×2
ADPR PRFSN 84XANTGRD RTRGD (ADAPTER) ×2
AGENT HMST KT MTR STRL THRMB (HEMOSTASIS) ×2
BAG DECANTER FOR FLEXI CONT (MISCELLANEOUS) ×4 IMPLANT
BANDAGE ACE 4X5 VEL STRL LF (GAUZE/BANDAGES/DRESSINGS) ×4 IMPLANT
BANDAGE ACE 6X5 VEL STRL LF (GAUZE/BANDAGES/DRESSINGS) ×4 IMPLANT
BANDAGE ELASTIC 4 VELCRO ST LF (GAUZE/BANDAGES/DRESSINGS) ×2 IMPLANT
BANDAGE ELASTIC 6 VELCRO ST LF (GAUZE/BANDAGES/DRESSINGS) ×2 IMPLANT
BASKET HEART  (ORDER IN 25'S) (MISCELLANEOUS) ×1
BASKET HEART (ORDER IN 25'S) (MISCELLANEOUS) ×1
BASKET HEART (ORDER IN 25S) (MISCELLANEOUS) ×2 IMPLANT
BLADE CLIPPER SURG (BLADE) IMPLANT
BLADE STERNUM SYSTEM 6 (BLADE) ×4 IMPLANT
BLADE SURG 12 STRL SS (BLADE) ×4 IMPLANT
BNDG GAUZE ELAST 4 BULKY (GAUZE/BANDAGES/DRESSINGS) ×4 IMPLANT
CANISTER SUCT 3000ML PPV (MISCELLANEOUS) ×4 IMPLANT
CANNULA GUNDRY RCSP 15FR (MISCELLANEOUS) ×4 IMPLANT
CATH CPB KIT VANTRIGT (MISCELLANEOUS) ×4 IMPLANT
CATH ROBINSON RED A/P 18FR (CATHETERS) ×12 IMPLANT
CATH THORACIC 36FR RT ANG (CATHETERS) ×4 IMPLANT
CLIP VESOCCLUDE SM WIDE 24/CT (CLIP) ×6 IMPLANT
COVER WAND RF STERILE (DRAPES) ×4 IMPLANT
CRADLE DONUT ADULT HEAD (MISCELLANEOUS) ×4 IMPLANT
DERMABOND ADVANCED (GAUZE/BANDAGES/DRESSINGS) ×2
DERMABOND ADVANCED .7 DNX12 (GAUZE/BANDAGES/DRESSINGS) IMPLANT
DRAIN CHANNEL 32F RND 10.7 FF (WOUND CARE) ×4 IMPLANT
DRAPE CARDIOVASCULAR INCISE (DRAPES) ×4
DRAPE SLUSH/WARMER DISC (DRAPES) ×4 IMPLANT
DRAPE SRG 135X102X78XABS (DRAPES) ×2 IMPLANT
DRSG AQUACEL AG ADV 3.5X14 (GAUZE/BANDAGES/DRESSINGS) ×4 IMPLANT
ELECT BLADE 4.0 EZ CLEAN MEGAD (MISCELLANEOUS) ×4
ELECT BLADE 6.5 EXT (BLADE) ×4 IMPLANT
ELECT CAUTERY BLADE 6.4 (BLADE) ×4 IMPLANT
ELECT REM PT RETURN 9FT ADLT (ELECTROSURGICAL) ×8
ELECTRODE BLDE 4.0 EZ CLN MEGD (MISCELLANEOUS) ×2 IMPLANT
ELECTRODE REM PT RTRN 9FT ADLT (ELECTROSURGICAL) ×4 IMPLANT
FELT TEFLON 1X6 (MISCELLANEOUS) ×5 IMPLANT
GAUZE SPONGE 4X4 12PLY STRL (GAUZE/BANDAGES/DRESSINGS) ×8 IMPLANT
GAUZE SPONGE 4X4 12PLY STRL LF (GAUZE/BANDAGES/DRESSINGS) ×6 IMPLANT
GLOVE BIO SURGEON STRL SZ 6 (GLOVE) ×6 IMPLANT
GLOVE BIO SURGEON STRL SZ 6.5 (GLOVE) ×5 IMPLANT
GLOVE BIO SURGEON STRL SZ7.5 (GLOVE) ×10 IMPLANT
GLOVE BIO SURGEONS STRL SZ 6.5 (GLOVE) ×5
GOWN STRL REUS W/ TWL LRG LVL3 (GOWN DISPOSABLE) ×14 IMPLANT
GOWN STRL REUS W/TWL LRG LVL3 (GOWN DISPOSABLE) ×40
HEMOSTAT POWDER SURGIFOAM 1G (HEMOSTASIS) ×3 IMPLANT
HEMOSTAT SURGICEL 2X14 (HEMOSTASIS) ×4 IMPLANT
INSERT FOGARTY 61MM (MISCELLANEOUS) ×2 IMPLANT
INSERT FOGARTY XLG (MISCELLANEOUS) ×3 IMPLANT
KIT BASIN OR (CUSTOM PROCEDURE TRAY) ×4 IMPLANT
KIT SUCTION CATH 14FR (SUCTIONS) ×4 IMPLANT
KIT TURNOVER KIT B (KITS) ×4 IMPLANT
KIT VASOVIEW HEMOPRO 2 VH 4000 (KITS) ×4 IMPLANT
LEAD PACING MYOCARDI (MISCELLANEOUS) ×6 IMPLANT
MARKER GRAFT CORONARY BYPASS (MISCELLANEOUS) ×9 IMPLANT
MARKER SKIN DUAL TIP RULER LAB (MISCELLANEOUS) ×3 IMPLANT
NS IRRIG 1000ML POUR BTL (IV SOLUTION) ×22 IMPLANT
PACK E OPEN HEART (SUTURE) ×4 IMPLANT
PACK OPEN HEART (CUSTOM PROCEDURE TRAY) ×4 IMPLANT
PAD ARMBOARD 7.5X6 YLW CONV (MISCELLANEOUS) ×8 IMPLANT
PAD ELECT DEFIB RADIOL ZOLL (MISCELLANEOUS) ×4 IMPLANT
PENCIL BUTTON HOLSTER BLD 10FT (ELECTRODE) ×4 IMPLANT
POWDER SURGICEL 3.0 GRAM (HEMOSTASIS) ×6 IMPLANT
PUNCH AORTIC ROTATE 4.0MM (MISCELLANEOUS) IMPLANT
PUNCH AORTIC ROTATE 4.5MM 8IN (MISCELLANEOUS) ×2 IMPLANT
PUNCH AORTIC ROTATE 5MM 8IN (MISCELLANEOUS) IMPLANT
SENSOR MYOCARDIAL TEMP (MISCELLANEOUS) ×2 IMPLANT
SET CARDIOPLEGIA MPS 5001102 (MISCELLANEOUS) ×2 IMPLANT
SURGIFLO W/THROMBIN 8M KIT (HEMOSTASIS) ×7 IMPLANT
SUT BONE WAX W31G (SUTURE) ×4 IMPLANT
SUT MNCRL AB 4-0 PS2 18 (SUTURE) ×2 IMPLANT
SUT PROLENE 3 0 SH DA (SUTURE) IMPLANT
SUT PROLENE 3 0 SH1 36 (SUTURE) IMPLANT
SUT PROLENE 4 0 RB 1 (SUTURE) ×4
SUT PROLENE 4 0 SH DA (SUTURE) ×6 IMPLANT
SUT PROLENE 4-0 RB1 .5 CRCL 36 (SUTURE) ×2 IMPLANT
SUT PROLENE 5 0 C 1 36 (SUTURE) IMPLANT
SUT PROLENE 6 0 C 1 30 (SUTURE) ×6 IMPLANT
SUT PROLENE 6 0 CC (SUTURE) ×12 IMPLANT
SUT PROLENE 8 0 BV175 6 (SUTURE) IMPLANT
SUT PROLENE BLUE 7 0 (SUTURE) ×7 IMPLANT
SUT PROLENE POLY MONO (SUTURE) ×3 IMPLANT
SUT SILK  1 MH (SUTURE)
SUT SILK 1 MH (SUTURE) IMPLANT
SUT SILK 2 0 SH CR/8 (SUTURE) IMPLANT
SUT SILK 3 0 SH CR/8 (SUTURE) IMPLANT
SUT STEEL 6MS V (SUTURE) ×6 IMPLANT
SUT STEEL SZ 6 DBL 3X14 BALL (SUTURE) ×7 IMPLANT
SUT VIC AB 1 CTX 36 (SUTURE) ×8
SUT VIC AB 1 CTX36XBRD ANBCTR (SUTURE) ×4 IMPLANT
SUT VIC AB 2-0 CT1 27 (SUTURE) ×4
SUT VIC AB 2-0 CT1 TAPERPNT 27 (SUTURE) ×2 IMPLANT
SUT VIC AB 2-0 CTX 27 (SUTURE) IMPLANT
SUT VIC AB 3-0 X1 27 (SUTURE) IMPLANT
SYSTEM SAHARA CHEST DRAIN ATS (WOUND CARE) ×4 IMPLANT
TAPE CLOTH SURG 4X10 WHT LF (GAUZE/BANDAGES/DRESSINGS) ×2 IMPLANT
TAPE PAPER 2X10 WHT MICROPORE (GAUZE/BANDAGES/DRESSINGS) ×2 IMPLANT
TOWEL GREEN STERILE (TOWEL DISPOSABLE) ×4 IMPLANT
TOWEL GREEN STERILE FF (TOWEL DISPOSABLE) ×10 IMPLANT
TRAY FOLEY SLVR 16FR TEMP STAT (SET/KITS/TRAYS/PACK) ×4 IMPLANT
TUBING INSUFFLATION (TUBING) ×4 IMPLANT
UNDERPAD 30X30 (UNDERPADS AND DIAPERS) ×4 IMPLANT
WATER STERILE IRR 1000ML POUR (IV SOLUTION) ×8 IMPLANT

## 2018-09-05 NOTE — Anesthesia Procedure Notes (Signed)
Procedure Name: Intubation Date/Time: 09/05/2018 7:45 AM Performed by: Candis Shine, CRNA Pre-anesthesia Checklist: Patient identified, Emergency Drugs available, Suction available and Patient being monitored Patient Re-evaluated:Patient Re-evaluated prior to induction Oxygen Delivery Method: Circle System Utilized Preoxygenation: Pre-oxygenation with 100% oxygen Induction Type: IV induction Ventilation: Mask ventilation without difficulty and Oral airway inserted - appropriate to patient size Laryngoscope Size: Mac and 3 Grade View: Grade I Tube type: Oral Tube size: 7.5 mm Number of attempts: 1 Airway Equipment and Method: Stylet and Oral airway Placement Confirmation: ETT inserted through vocal cords under direct vision,  positive ETCO2 and breath sounds checked- equal and bilateral Secured at: 23 cm Tube secured with: Tape Dental Injury: Teeth and Oropharynx as per pre-operative assessment

## 2018-09-05 NOTE — Anesthesia Procedure Notes (Signed)
Central Venous Catheter Insertion Performed by: Arta Bruce, MD, anesthesiologist Start/End11/11/2017 6:35 AM, 09/05/2018 6:50 AM Patient location: Pre-op. Preanesthetic checklist: patient identified, IV checked, risks and benefits discussed, surgical consent, monitors and equipment checked, pre-op evaluation, timeout performed and anesthesia consent Position: Trendelenburg Lidocaine 1% used for infiltration and patient sedated Hand hygiene performed , maximum sterile barriers used  and Seldinger technique used Catheter size: 8.5 Fr Total catheter length 10. Central line and PA cath was placed.Sheath introducer Swan type:thermodilution PA Cath depth:50 Procedure performed using ultrasound guided technique. Ultrasound Notes:anatomy identified, needle tip was noted to be adjacent to the nerve/plexus identified, no ultrasound evidence of intravascular and/or intraneural injection and image(s) printed for medical record Attempts: 1 Following insertion, line sutured, dressing applied and Biopatch. Post procedure assessment: blood return through all ports, free fluid flow and no air  Patient tolerated the procedure well with no immediate complications.

## 2018-09-05 NOTE — Procedures (Signed)
Extubation Procedure Note  Patient Details:   Name: Joe Durham DOB: Mar 20, 1946 MRN: [id number]   Airway Documentation:    Vent end date: 09/05/18 Vent end time: 2058   Evaluation  O2 sats: stable throughout Complications: No apparent complications Patient did tolerate procedure well. Bilateral Breath Sounds: Clear, Diminished    Patient able to post extubation: Yes  Extubation procedure clearly explained to the patient. Pt nodded his head for understanding. Audible cuff leak noted prior to extubation. Pt was titrated to a 4L [place] post extubation. Pt tolerated procedure well. No complications noted. IS perform post extubation per RRT and RN. Pt is hemodynamically stable.  Benjamine Sprague, B.S, RRT, RCP 09/05/2018, 9:12 PM

## 2018-09-05 NOTE — Progress Notes (Signed)
Pt is on PSV at this time tolerating it well.

## 2018-09-05 NOTE — Progress Notes (Signed)
Pt passed pulmonary mechanics. Done with good effort. Patient is alert and following commands. Patient able to lift head off the pillow for >10sec. Pt able to pressed against my hand with his foot, good strength noted. No complications noted. Patient is hemodynamically stable.   NIF: -28 FVC: 1.4L

## 2018-09-05 NOTE — Progress Notes (Signed)
Weaning initiated at this time. Pt is tolerating it well.

## 2018-09-05 NOTE — Transfer of Care (Signed)
Immediate Anesthesia Transfer of Care Note  Patient: Joe Durham  Procedure(s) Performed: CORONARY ARTERY BYPASS GRAFTING (CABG) times 3 using left internal mammary artery and right greater saphenous vein harvested endoscopically. (N/A Chest) TRANSESOPHAGEAL ECHOCARDIOGRAM (TEE) (N/A )  Patient Location: ICU  Anesthesia Type:General  Level of Consciousness: sedated and Patient remains intubated per anesthesia plan  Airway & Oxygen Therapy: Patient remains intubated per anesthesia plan and Patient placed on Ventilator (see vital sign flow sheet for setting)  Post-op Assessment: Report given to RN and Post -op Vital signs reviewed and stable  Post vital signs: Reviewed and stable  Last Vitals:  Vitals Value Taken Time  BP    Temp    Pulse 79 09/05/2018  1:48 PM  Resp 3 09/05/2018  1:48 PM  SpO2 100 % 09/05/2018  1:48 PM  Vitals shown include unvalidated device data.  Last Pain:  Vitals:   09/05/18 0405  TempSrc: Oral  PainSc:       Patients Stated Pain Goal: 0 (09/04/18 2000)  Complications: No apparent anesthesia complications

## 2018-09-05 NOTE — Progress Notes (Signed)
Pre Procedure note for inpatients:   Joe Durham has been scheduled for Procedure(s): CORONARY ARTERY BYPASS GRAFTING (CABG) (N/A) TRANSESOPHAGEAL ECHOCARDIOGRAM (TEE) (N/A) today. The various methods of treatment have been discussed with the patient. After consideration of the risks, benefits and treatment options the patient has consented to the planned procedure.   The patient has been seen and labs reviewed. There are no changes in the patient's condition to prevent proceeding with the planned procedure today.  Recent labs:  Lab Results  Component Value Date   WBC 8.2 09/05/2018   HGB 12.4 (L) 09/05/2018   HCT 39.2 09/05/2018   PLT 238 09/05/2018   GLUCOSE 92 09/05/2018   CHOL 173 03/27/2018   TRIG 49 03/27/2018   HDL 76 03/27/2018   LDLCALC 87 03/27/2018   ALT 14 09/04/2018   AST 25 09/04/2018   NA 136 09/05/2018   K 4.1 09/05/2018   CL 104 09/05/2018   CREATININE 0.99 09/05/2018   BUN 7 (L) 09/05/2018   CO2 26 09/05/2018   TSH 1.427 09/04/2018   INR 1.08 09/04/2018   HGBA1C 4.7 (L) 01/04/2015    Mikey Bussing, MD 09/05/2018 7:05 AM

## 2018-09-05 NOTE — Progress Notes (Signed)
Patient does have an audible cuff leak.

## 2018-09-05 NOTE — Anesthesia Procedure Notes (Signed)
Arterial Line Insertion Start/End11/11/2017 7:00 AM Performed by: Marena Chancy, CRNA, CRNA  Patient location: Pre-op. Preanesthetic checklist: patient identified, IV checked, site marked, risks and benefits discussed, surgical consent, monitors and equipment checked, pre-op evaluation, timeout performed and anesthesia consent Lidocaine 1% used for infiltration Left, radial was placed Catheter size: 20 G Hand hygiene performed  and maximum sterile barriers used   Attempts: 1 Procedure performed without using ultrasound guided technique. Following insertion, dressing applied and Biopatch. Post procedure assessment: normal and unchanged  Patient tolerated the procedure well with no immediate complications.

## 2018-09-05 NOTE — Brief Op Note (Signed)
09/03/2018 - 09/05/2018  11:43 AM  PATIENT:  Joe Durham  72 y.o. male  PRE-OPERATIVE DIAGNOSIS:  1. S/p NSTEMI 2.CAD  POST-OPERATIVE DIAGNOSIS:  1. S/p NSTEMI 2.CAD  PROCEDURE:   TRANSESOPHAGEAL ECHOCARDIOGRAM (TEE), MEDIAN STERNOTOMY for CORONARY ARTERY BYPASS GRAFTING (CABG) times  3 (LIMA to LAD, SVG to OM, SVG to RCA)using left internal mammary artery and right greater saphenous vein harvested endoscopically.   SURGEON:  Surgeon(s) and Role:    Kerin Perna, MD - Primary  PHYSICIAN ASSISTANT: Doree Fudge PA-C  ASSISTANTS: Tanda Rockers RNFA  ANESTHESIA:   general  EBL:  As per recorded by anesthesia and perfusion record  BLOOD ADMINISTERED:one CC PRBC  DRAINS: Chest tubes placed in the mediastinal and pleural spaces   COUNTS CORRECT:  YES   DICTATION: .Dragon Dictation  PLAN OF CARE: Admit to inpatient   PATIENT DISPOSITION:  ICU - intubated and hemodynamically stable.   Delay start of Pharmacological VTE agent (>24hrs) due to surgical blood loss or risk of bleeding: yes  BASELINE WEIGHT: 53 kg

## 2018-09-05 NOTE — Anesthesia Preprocedure Evaluation (Addendum)
Anesthesia Evaluation  Patient identified by MRN, date of birth, ID band Patient awake    Reviewed: Allergy & Precautions, NPO status , Patient's Chart, lab work & pertinent test results  Airway Mallampati: II  TM Distance: >3 FB Neck ROM: Full    Dental  (+) Edentulous Upper, Poor Dentition, Loose, Dental Advisory Given   Pulmonary COPD, Current Smoker,    Pulmonary exam normal breath sounds clear to auscultation       Cardiovascular hypertension, + CAD, + Past MI and + Peripheral Vascular Disease  Normal cardiovascular exam Rhythm:Regular Rate:Normal   Ost Cx to Prox Cx lesion is 99% stenosed.  Mid Cx to Dist Cx lesion is 80% stenosed.  Dist LM to Prox LAD lesion is 90% stenosed.  Ost 1st Diag lesion is 99% stenosed.  Prox RCA to Mid RCA lesion is 80% stenosed.  Dist RCA lesion is 60% stenosed.   1.  Severe heavily calcified three-vessel coronary artery disease.  The coronary arteries are very tortuous. 2.  Left ventricular angiography was not performed.  EF was normal by echo. 3.  Mildly elevated left ventricular end-diastolic pressure at 17 mmHg.    Neuro/Psych CVA negative psych ROS   GI/Hepatic negative GI ROS, Neg liver ROS,   Endo/Other  negative endocrine ROS  Renal/GU negative Renal ROS  negative genitourinary   Musculoskeletal negative musculoskeletal ROS (+)   Abdominal   Peds negative pediatric ROS (+)  Hematology negative hematology ROS (+)   Anesthesia Other Findings   Reproductive/Obstetrics negative OB ROS                            Anesthesia Physical Anesthesia Plan  ASA: IV  Anesthesia Plan: General   Post-op Pain Management:    Induction: Intravenous  PONV Risk Score and Plan: 0  Airway Management Planned: Oral ETT  Additional Equipment: Arterial line, CVP, PA Cath, TEE and Ultrasound Guidance Line Placement  Intra-op Plan:   Post-operative  Plan: Post-operative intubation/ventilation  Informed Consent: I have reviewed the patients History and Physical, chart, labs and discussed the procedure including the risks, benefits and alternatives for the proposed anesthesia with the patient or authorized representative who has indicated his/her understanding and acceptance.   Dental advisory given  Plan Discussed with: CRNA and Surgeon  Anesthesia Plan Comments:         Anesthesia Quick Evaluation

## 2018-09-05 NOTE — Progress Notes (Signed)
Patient ID: Joe Durham, male   DOB: 1946-01-09, 72 y.o.   MRN: [id number]  TCTS Evening Rounds:   Hemodynamically stable on milrinone 0.25  CI = 1.8 AV paced 88  Has started to wake up on vent.   Urine output good  CT output low  CBC    Component Value Date/Time   WBC 13.1 (H) 09/05/2018 1344   RBC 2.80 (L) 09/05/2018 1344   HGB 9.2 (L) 09/05/2018 1345   HCT 27.0 (L) 09/05/2018 1345   HCT 39.5 01/04/2015 1115   PLT 124 (L) 09/05/2018 1344   MCV 100.7 (H) 09/05/2018 1344   MCH 34.3 (H) 09/05/2018 1344   MCHC 34.0 09/05/2018 1344   RDW 15.7 (H) 09/05/2018 1344   LYMPHSABS 1.8 06/15/2017 0408   MONOABS 1.0 06/15/2017 0408   EOSABS 0.5 06/15/2017 0408   BASOSABS 0.0 06/15/2017 0408     BMET    Component Value Date/Time   NA 139 09/05/2018 1345   NA 142 03/27/2018 1306   K 4.0 09/05/2018 1345   CL 101 09/05/2018 1235   CO2 26 09/05/2018 0450   GLUCOSE 119 (H) 09/05/2018 1345   BUN 4 (L) 09/05/2018 1235   BUN 9 03/27/2018 1306   CREATININE 0.70 09/05/2018 1235   CALCIUM 9.2 09/05/2018 0450   GFRNONAA >60 09/05/2018 0450   GFRAA >60 09/05/2018 0450     A/P:  Stable postop course. Continue current plans

## 2018-09-05 NOTE — Progress Notes (Signed)
  Echocardiogram Echocardiogram Transesophageal has been performed.  Gerda Diss 09/05/2018, 8:35 AM

## 2018-09-06 ENCOUNTER — Inpatient Hospital Stay (HOSPITAL_COMMUNITY): Payer: Medicare Other

## 2018-09-06 DIAGNOSIS — Z951 Presence of aortocoronary bypass graft: Secondary | ICD-10-CM

## 2018-09-06 LAB — POCT I-STAT, CHEM 8
BUN: 6 mg/dL — ABNORMAL LOW (ref 8–23)
CALCIUM ION: 1.27 mmol/L (ref 1.15–1.40)
CHLORIDE: 100 mmol/L (ref 98–111)
Creatinine, Ser: 0.9 mg/dL (ref 0.61–1.24)
GLUCOSE: 122 mg/dL — AB (ref 70–99)
HCT: 32 % — ABNORMAL LOW (ref 39.0–52.0)
Hemoglobin: 10.9 g/dL — ABNORMAL LOW (ref 13.0–17.0)
Potassium: 4.5 mmol/L (ref 3.5–5.1)
Sodium: 138 mmol/L (ref 135–145)
TCO2: 27 mmol/L (ref 22–32)

## 2018-09-06 LAB — MAGNESIUM
Magnesium: 2.9 mg/dL — ABNORMAL HIGH (ref 1.7–2.4)
Magnesium: 2.5 mg/dL — ABNORMAL HIGH (ref 1.7–2.4)

## 2018-09-06 LAB — CBC
HCT: 29.1 % — ABNORMAL LOW (ref 39.0–52.0)
HEMATOCRIT: 31.4 % — AB (ref 39.0–52.0)
Hemoglobin: 9.5 g/dL — ABNORMAL LOW (ref 13.0–17.0)
Hemoglobin: 10.1 g/dL — ABNORMAL LOW (ref 13.0–17.0)
MCH: 34.1 pg — ABNORMAL HIGH (ref 26.0–34.0)
MCH: 34 pg (ref 26.0–34.0)
MCHC: 32.6 g/dL (ref 30.0–36.0)
MCHC: 32.2 g/dL (ref 30.0–36.0)
MCV: 104.3 fL — ABNORMAL HIGH (ref 80.0–100.0)
MCV: 105.7 fL — ABNORMAL HIGH (ref 80.0–100.0)
NRBC: 0 % (ref 0.0–0.2)
NRBC: 0 % (ref 0.0–0.2)
PLATELETS: 145 10*3/uL — AB (ref 150–400)
Platelets: 145 10*3/uL — ABNORMAL LOW (ref 150–400)
RBC: 2.79 MIL/uL — ABNORMAL LOW (ref 4.22–5.81)
RBC: 2.97 MIL/uL — ABNORMAL LOW (ref 4.22–5.81)
RDW: 16.1 % — AB (ref 11.5–15.5)
RDW: 16.2 % — AB (ref 11.5–15.5)
WBC: 14.1 10*3/uL — AB (ref 4.0–10.5)
WBC: 13.9 10*3/uL — ABNORMAL HIGH (ref 4.0–10.5)

## 2018-09-06 LAB — COOXEMETRY PANEL
Carboxyhemoglobin: 1.2 % (ref 0.5–1.5)
Carboxyhemoglobin: 1.3 % (ref 0.5–1.5)
Methemoglobin: 1.8 % — ABNORMAL HIGH (ref 0.0–1.5)
Methemoglobin: 1.6 % — ABNORMAL HIGH (ref 0.0–1.5)
O2 Saturation: 71.4 %
O2 Saturation: 53.4 %
Total hemoglobin: 9.5 g/dL — ABNORMAL LOW (ref 12.0–16.0)
Total hemoglobin: 9.2 g/dL — ABNORMAL LOW (ref 12.0–16.0)

## 2018-09-06 LAB — BASIC METABOLIC PANEL
ANION GAP: 3 — AB (ref 5–15)
BUN: <5 mg/dL — ABNORMAL LOW (ref 8–23)
CALCIUM: 8.3 mg/dL — AB (ref 8.9–10.3)
CO2: 24 mmol/L (ref 22–32)
Chloride: 111 mmol/L (ref 98–111)
Creatinine, Ser: 0.92 mg/dL (ref 0.61–1.24)
Glucose, Bld: 98 mg/dL (ref 70–99)
Potassium: 4.6 mmol/L (ref 3.5–5.1)
Sodium: 138 mmol/L (ref 135–145)

## 2018-09-06 LAB — PREPARE FRESH FROZEN PLASMA: Unit division: 0

## 2018-09-06 LAB — GLUCOSE, CAPILLARY
GLUCOSE-CAPILLARY: 95 mg/dL (ref 70–99)
Glucose-Capillary: 108 mg/dL — ABNORMAL HIGH (ref 70–99)
Glucose-Capillary: 122 mg/dL — ABNORMAL HIGH (ref 70–99)
Glucose-Capillary: 84 mg/dL (ref 70–99)

## 2018-09-06 LAB — BPAM FFP
Blood Product Expiration Date: 201911042359
ISSUE DATE / TIME: 201911011218
Unit Type and Rh: 6200

## 2018-09-06 LAB — CREATININE, SERUM
Creatinine, Ser: 1.07 mg/dL (ref 0.61–1.24)
GFR calc non Af Amer: >60 mL/min (ref >60)

## 2018-09-06 MED ORDER — POTASSIUM CHLORIDE CRYS ER 20 MEQ PO TBCR
20.0000 meq | EXTENDED_RELEASE_TABLET | Freq: Two times a day (BID) | ORAL | Status: AC
  Administered 2018-09-06 (×2): 20 meq via ORAL
  Filled 2018-09-06 (×2): qty 1

## 2018-09-06 MED ORDER — FUROSEMIDE 10 MG/ML IJ SOLN
40.0000 mg | Freq: Four times a day (QID) | INTRAMUSCULAR | Status: AC
  Administered 2018-09-06 (×2): 40 mg via INTRAVENOUS
  Filled 2018-09-06: qty 4

## 2018-09-06 MED ORDER — METOPROLOL TARTRATE 25 MG/10 ML ORAL SUSPENSION: 25.0000 mg | Freq: Two times a day (BID) | ORAL | Status: DC

## 2018-09-06 MED ORDER — ENOXAPARIN SODIUM 40 MG/0.4ML ~~LOC~~ SOLN
40.0000 mg | Freq: Every day | SUBCUTANEOUS | Status: DC
  Administered 2018-09-06 – 2018-09-07 (×2): 40 mg via SUBCUTANEOUS
  Filled 2018-09-06 (×2): qty 0.4

## 2018-09-06 MED ORDER — LEVALBUTEROL HCL 0.63 MG/3ML IN NEBU
0.6300 mg | INHALATION_SOLUTION | Freq: Three times a day (TID) | RESPIRATORY_TRACT | Status: DC
  Administered 2018-09-06 – 2018-09-08 (×7): 0.63 mg via RESPIRATORY_TRACT
  Filled 2018-09-06 (×11): qty 3

## 2018-09-06 MED ORDER — METOPROLOL TARTRATE 25 MG PO TABS
25.0000 mg | ORAL_TABLET | Freq: Two times a day (BID) | ORAL | Status: DC
  Administered 2018-09-06 – 2018-09-07 (×2): 25 mg via ORAL
  Filled 2018-09-06 (×2): qty 1

## 2018-09-06 NOTE — Progress Notes (Signed)
Patient ID: Joe Durham, male   DOB: 06/11/1946, 72 y.o.   MRN: [id number] TCTS Evening Rounds:  Hemodynamically stable. Paced. Sensing atrium and pacing ventricle 98. Off milrinone Co-ox dropped to 53.4. Will repeat in am. sats 98% 2L  Diuresing well  BMET    Component Value Date/Time   NA 138 09/06/2018 1659   NA 142 03/27/2018 1306   K 4.5 09/06/2018 1659   CL 100 09/06/2018 1659   CO2 24 09/06/2018 0435   GLUCOSE 122 (H) 09/06/2018 1659   BUN 6 (L) 09/06/2018 1659   BUN 9 03/27/2018 1306   CREATININE 1.07 09/06/2018 1700   CALCIUM 8.3 (L) 09/06/2018 0435   GFRNONAA >60 09/06/2018 1700   GFRAA >60 09/06/2018 1700   Mg2+ 2.5 CBC    Component Value Date/Time   WBC 13.9 (H) 09/06/2018 1700   RBC 2.97 (L) 09/06/2018 1700   HGB 10.1 (L) 09/06/2018 1700   HCT 31.4 (L) 09/06/2018 1700   HCT 39.5 01/04/2015 1115   PLT 145 (L) 09/06/2018 1700   MCV 105.7 (H) 09/06/2018 1700   MCH 34.0 09/06/2018 1700   MCHC 32.2 09/06/2018 1700   RDW 16.2 (H) 09/06/2018 1700   LYMPHSABS 1.8 06/15/2017 0408   MONOABS 1.0 06/15/2017 0408   EOSABS 0.5 06/15/2017 0408   BASOSABS 0.0 06/15/2017 0408

## 2018-09-06 NOTE — Progress Notes (Signed)
1 Day Post-Op Procedure(s) (LRB): CORONARY ARTERY BYPASS GRAFTING (CABG) times 3 using left internal mammary artery and right greater saphenous vein harvested endoscopically. (N/A) TRANSESOPHAGEAL ECHOCARDIOGRAM (TEE) (N/A) Subjective: No complaints. Up in chair  Objective: Vital signs in last 24 hours: Temp:  [95.2 F (35.1 C)-98.4 F (36.9 C)] 98.4 F (36.9 C) (11/02 1200) Pulse Rate:  [77-101] 77 (11/02 1200) Cardiac Rhythm: Sinus tachycardia;Junctional rhythm;A-V Sequential paced;Ventricular paced (11/02 0800) Resp:  [3-32] 23 (11/02 1200) BP: (91-139)/(61-103) 132/79 (11/02 1100) SpO2:  [93 %-100 %] 93 % (11/02 1200) Arterial Line BP: (97-168)/(47-92) 166/88 (11/02 1200) FiO2 (%):  [40 %-50 %] 40 % (11/01 2032) Weight:  [58.5 kg] 58.5 kg (11/02 0500)  Hemodynamic parameters for last 24 hours: PAP: (9-48)/(0-32) 48/32 CO:  [2.1 L/min-4.4 L/min] 4.4 L/min CI:  [1.3 L/min/m2-2.7 L/min/m2] 2.7 L/min/m2  Intake/Output from previous day: 11/01 0701 - 11/02 0700 In: 5622.3 [P.O.:560; I.V.:3251; Blood:511; IV Piggyback:1300.3] Out: 3793 [Urine:3360; Chest Tube:433] Intake/Output this shift: Total I/O In: 440.5 [P.O.:360; I.V.:80.5] Out: 440 [Urine:350; Chest Tube:90]  General appearance: alert and cooperative Neurologic: intact Heart: regular rate and rhythm, S1, S2 normal, no murmur, click, rub or gallop Lungs: clear to auscultation bilaterally Extremities: edema mild Wound: dressings dry  Lab Results: Recent Labs    09/05/18 1841 09/05/18 1852 09/06/18 0435  WBC 12.3*  --  14.1*  HGB 9.4* 9.2* 9.5*  HCT 28.5* 27.0* 29.1*  PLT 138*  --  145*   BMET:  Recent Labs    09/05/18 0450  09/05/18 1852 09/06/18 0435  NA 136   < > 139 138  K 4.1   < > 4.4 4.6  CL 104   < > 107 111  CO2 26  --   --  24  GLUCOSE 92   < > 117* 98  BUN 7*   < > 4* <5*  CREATININE 0.99   < > 0.80 0.92  CALCIUM 9.2  --   --  8.3*   < > = values in this interval not displayed.     PT/INR:  Recent Labs    09/05/18 1344  LABPROT 17.6*  INR 1.46   ABG    Component Value Date/Time   PHART 7.335 (L) 09/05/2018 2214   HCO3 24.4 09/05/2018 2214   TCO2 26 09/05/2018 2214   ACIDBASEDEF 2.0 09/05/2018 2214   O2SAT 71.4 09/06/2018 0700   CBG (last 3)  Recent Labs    09/06/18 0358 09/06/18 0729 09/06/18 1145  GLUCAP 95 84 108*   CXR: ok  ECG: sinus, no acute changes  Assessment/Plan: S/P Procedure(s) (LRB): CORONARY ARTERY BYPASS GRAFTING (CABG) times 3 using left internal mammary artery and right greater saphenous vein harvested endoscopically. (N/A) TRANSESOPHAGEAL ECHOCARDIOGRAM (TEE) (N/A)  POD 1 Hemodynamically stable in sinus rhythm. Co-ox 71%. Wean off milrinone. Continue Lopressor. Volume excess: wt is 12 lbs over preop. Start diuresis and K+ replacement. DC chest tubes, swan, arterial line.  Ambulate, IS.   LOS: 3 days    Alleen Borne 09/06/2018

## 2018-09-06 NOTE — Op Note (Signed)
NAMECRAIGE, PATEL MEDICAL RECORD [id number] ACCOUNT [id number] DATE OF BIRTH:06-27-1946 FACILITY: [facility] LOCATION: [facility] PHYSICIAN:PETER VAN TRIGT III, MD  OPERATIVE REPORT  DATE OF PROCEDURE:  09/05/2018  OPERATION: 1.  Coronary artery bypass grafting x3 (left internal mammary artery to left anterior descending, saphenous vein graft to obtuse marginal 1, saphenous vein graft to posterior descending). 2.  Endoscopic harvest of right leg greater saphenous vein.  SURGEON:  Mikey Bussing, MD  ASSISTANT:  Doree Fudge, PA-C and Tanda Rockers, RNFA  ANESTHESIA:  General, by Dr. Eilene Ghazi  PREOPERATIVE DIAGNOSES:  Severe 3-vessel coronary artery disease, non-ST elevation myocardial infarction, unstable angina, moderate left ventricular dysfunction.  POSTOPERATIVE DIAGNOSES:  Severe 3-vessel coronary artery disease, non-ST elevation myocardial infarction, unstable angina, moderate left ventricular dysfunction.  CLINICAL NOTE:  The patient is a 72 year old thin male smoker in general poor health who presents with chest pain, positive cardiac enzymes and moderate LV dysfunction.  He was stabilized and underwent cardiac catheterization.  His cardiologist  recommended multivessel bypass grafting as he would be difficult to treat with PCI.  I examined the patient after reviewing his cardiac cath images and echocardiogram images and discussed the procedure of CABG.  He appeared very deconditioned and  somewhat frail.  He admitted to weight loss of over 20 pounds the last part of this year.  He admitted to continuing to smoke and to having a fairly poor diet.  He denied any recent fever, syncope or falls.  He denies heavy drinking and smokes about 1  pack per day.  There is a remote history of drug abuse with cocaine but not in several years.  After reviewing the details of surgery including the use of general anesthesia, cardiopulmonary bypass, and the location of the surgical  incisions and discussing the expected recovery and potential risks (stroke, bleeding, blood transfusion, infection,  organ failure, pulmonary problems including pneumonia, death), he agreed to proceed with surgery under what I felt was informed consent.  OPERATIVE FINDINGS: 1.  Small but graftable target vessels. 2.  Adequate conduit. 3.  Preoperative anemia, and he required 1 unit of packed cells during the procedure. 4.  Preserved LV function after separation from cardiopulmonary bypass with TEE.  DESCRIPTION OF PROCEDURE:  The patient was brought to the operating room and placed supine on the operating table.  General anesthesia was induced under invasive hemodynamic monitoring.  The chest, abdomen and legs were prepped with Betadine, draped as a  sterile field.  It should be noted the patient's suprapubic tube was removed, and a new tube with a temperature probe was placed under sterile technique.  A proper time-out was performed.  A sternal incision was made as the saphenous vein was harvested endoscopically from the right leg.  The left internal mammary artery was harvested as a pedicle graft from its origin at the subclavian vessels.  This was  tedious because of extremely dense pleural adhesions which needed to be taken down and the lung mobilized in order to allow the mammary pedicle to reach the LAD.  The sternal retractor was placed, and the pericardium was opened and suspended.  The ascending aorta was slightly enlarged, but measured only 4 cm.  The aorta itself was not heavily calcified.  Pursestrings were placed in the ascending aorta and right  atrium, and after heparin was administered, the ACT was therapeutic.  The patient was cannulated and placed on bypass.  The coronaries were identified for grafting, and the mammary artery and vein  grafts were prepared for the distal anastomosis.   Cardioplegia cannulas were placed both antegrade and retrograde cold blood cardioplegia.  The  patient was cooled to 32 degrees, and the aortic cross-clamp was applied.  A liter of cold blood cardioplegia was delivered in split doses between the antegrade  aortic and retrograde coronary sinus catheters.  There was good cardioplegic arrest and supple temperature dropped less than 12 degrees.  Cardioplegia was delivered every 20 minutes.  The distal coronary anastomoses were performed.  The first distal anastomosis was to the posterior descending.  This was a 1.7 mm vessel, proximal 95% stenosis.  Reverse saphenous vein was sewn end-to-side with running 7-0 Prolene with good flow through  the graft.  Cardioplegia was redosed.  The second distal anastomosis was to the OM1.  This was a smaller 1.2 mm vessel, proximal 90% stenosis.  Reverse saphenous vein was sewn end-to-side with running 7-0 Prolene with good flow through the graft.  Cardioplegia was redosed.  The third distal anastomosis was to the mid to distal third of the LAD.  It was intramyocardial more proximally.  The left IMA pedicle was brought through an opening in the pericardium and was brought down onto the LAD and sewn end-to-side using running  8-0 Prolene to a 1.5 mm LAD vessel.  There was good flow through the anastomosis after briefly releasing the pedicle bulldog, and the bulldog was reapplied.  The pedicle was secured to the epicardium.  Cardioplegia was redosed.  While the cross-clamp was still in place, 2 proximal vein anastomoses were performed on the ascending aorta using a 4.5 mm punch and running 6-0 Prolene.  Prior to tying down the final proximal anastomosis, air was vented from  the coronaries with a dose of retrograde warm blood cardioplegia.  The cross-clamp was removed.  The heart was cardioverted back to a regular rhythm.  The patient was rewarmed and reperfused.  The vein grafts were deaired and opened and each had good flow.  Hemostasis was documented at the proximal and distal anastomoses.  Temporary pacing wires   were applied.  The lungs were carefully reexpanded and ventilator was resumed.  The patient was started on low-dose milrinone.  After reperfusion, the patient was weaned from cardiopulmonary bypass uneventfully.  Echo showed preserved LV mild-moderately  reduced systolic function.  Hemodynamics were stable.  Protamine was administered without adverse reaction.  There was still significant bleeding after reversal of heparin with protamine, so the patient was given FFP with improved coagulation function.  The superior pericardial fat was closed over the aorta.  Bilateral pleural and anterior mediastinal chest tubes were placed and brought out through separate incisions.  The sternum was closed with a wire.  The patient remained stable.  The pectoralis  fascia was closed with running #1 Vicryl.  The patient remained stable.  The skin and subcutaneous layers were closed with a running Vicryl and sterile dressings were applied.  Total cardiopulmonary bypass time was 118 minutes.  LN/NUANCE  D:09/05/2018 T:09/05/2018 JOB:[id number]

## 2018-09-07 ENCOUNTER — Inpatient Hospital Stay (HOSPITAL_COMMUNITY): Payer: Medicare Other

## 2018-09-07 LAB — BASIC METABOLIC PANEL
Anion gap: 4 — ABNORMAL LOW (ref 5–15)
BUN: 8 mg/dL (ref 8–23)
CO2: 27 mmol/L (ref 22–32)
Calcium: 9 mg/dL (ref 8.9–10.3)
Chloride: 103 mmol/L (ref 98–111)
Creatinine, Ser: 1.01 mg/dL (ref 0.61–1.24)
GFR calc Af Amer: >60 mL/min (ref >60)
Glucose, Bld: 123 mg/dL — ABNORMAL HIGH (ref 70–99)
POTASSIUM: 4.6 mmol/L (ref 3.5–5.1)
SODIUM: 134 mmol/L — AB (ref 135–145)

## 2018-09-07 LAB — CBC
HCT: 30.2 % — ABNORMAL LOW (ref 39.0–52.0)
HEMOGLOBIN: 9.4 g/dL — AB (ref 13.0–17.0)
MCH: 33.1 pg (ref 26.0–34.0)
MCHC: 31.1 g/dL (ref 30.0–36.0)
MCV: 106.3 fL — ABNORMAL HIGH (ref 80.0–100.0)
Platelets: 170 10*3/uL (ref 150–400)
RBC: 2.84 MIL/uL — ABNORMAL LOW (ref 4.22–5.81)
RDW: 15.9 % — ABNORMAL HIGH (ref 11.5–15.5)
WBC: 13.8 10*3/uL — AB (ref 4.0–10.5)
nRBC: 0 % (ref 0.0–0.2)

## 2018-09-07 LAB — COOXEMETRY PANEL
CARBOXYHEMOGLOBIN: 1.4 % (ref 0.5–1.5)
Carboxyhemoglobin: 1.4 % (ref 0.5–1.5)
Methemoglobin: 1.6 % — ABNORMAL HIGH (ref 0.0–1.5)
Methemoglobin: 1.5 % (ref 0.0–1.5)
O2 SAT: 51.5 %
O2 Saturation: 44 %
Total hemoglobin: 12 g/dL (ref 12.0–16.0)
Total hemoglobin: 10.2 g/dL — ABNORMAL LOW (ref 12.0–16.0)

## 2018-09-07 LAB — GLUCOSE, CAPILLARY: Glucose-Capillary: 102 mg/dL — ABNORMAL HIGH (ref 70–99)

## 2018-09-07 MED ORDER — POTASSIUM CHLORIDE CRYS ER 20 MEQ PO TBCR
20.0000 meq | EXTENDED_RELEASE_TABLET | Freq: Two times a day (BID) | ORAL | Status: AC
  Administered 2018-09-07 (×2): 20 meq via ORAL
  Filled 2018-09-07 (×2): qty 1

## 2018-09-07 MED ORDER — FUROSEMIDE 10 MG/ML IJ SOLN
40.0000 mg | Freq: Two times a day (BID) | INTRAMUSCULAR | Status: AC
  Administered 2018-09-07 (×2): 40 mg via INTRAVENOUS
  Filled 2018-09-07 (×2): qty 4

## 2018-09-07 MED ORDER — METOPROLOL TARTRATE 12.5 MG HALF TABLET
12.5000 mg | ORAL_TABLET | Freq: Two times a day (BID) | ORAL | Status: DC
  Administered 2018-09-07: 12.5 mg via ORAL
  Filled 2018-09-07: qty 1

## 2018-09-07 MED ORDER — TRAMADOL HCL 50 MG PO TABS
50.0000 mg | ORAL_TABLET | ORAL | Status: DC | PRN
  Administered 2018-09-10: 50 mg via ORAL
  Filled 2018-09-07: qty 1

## 2018-09-07 MED ORDER — MILRINONE LACTATE IN DEXTROSE 20-5 MG/100ML-% IV SOLN: 0.1250 ug/kg/min | INTRAVENOUS | Status: DC

## 2018-09-07 NOTE — Progress Notes (Signed)
Patient ID: Joe Durham, male   DOB: Jun 04, 1946, 72 y.o.   MRN: [id number] TCTS Evening Rounds:  Hemodynamically stable in sinus rhythm. sats 92% on RA Ambulated well  Diuresing.

## 2018-09-07 NOTE — Progress Notes (Signed)
2 Days Post-Op Procedure(s) (LRB): CORONARY ARTERY BYPASS GRAFTING (CABG) times 3 using left internal mammary artery and right greater saphenous vein harvested endoscopically. (N/A) TRANSESOPHAGEAL ECHOCARDIOGRAM (TEE) (N/A) Subjective: No complaints Ambulated this am and felt ok Only doing 250 cc on IS  Objective: Vital signs in last 24 hours: Temp:  [97.5 F (36.4 C)-98.8 F (37.1 C)] 97.5 F (36.4 C) (11/03 0729) Pulse Rate:  [77-126] 79 (11/03 1000) Cardiac Rhythm: Ventricular paced (11/03 0000) Resp:  [0-24] 13 (11/03 1000) BP: (108-140)/(76-89) 129/78 (11/03 1000) SpO2:  [91 %-100 %] 94 % (11/03 1000) Arterial Line BP: (129-166)/(65-88) 139/70 (11/02 1400) Weight:  [57 kg] 57 kg (11/03 0500)  Hemodynamic parameters for last 24 hours: PAP: (22-48)/(7-32) 31/13  Intake/Output from previous day: 11/02 0701 - 11/03 0700 In: 642.9 [P.O.:360; I.V.:282.9] Out: 2190 [Urine:2100; Chest Tube:90] Intake/Output this shift: Total I/O In: -  Out: 100 [Urine:100]  General appearance: alert and cooperative Neurologic: intact Heart: regular rate and rhythm, S1, S2 normal, no murmur, click, rub or gallop Lungs: clear to auscultation bilaterally Abdomen: soft, non-tender; bowel sounds normal; no masses,  no organomegaly Extremities: extremities normal, atraumatic, no cyanosis or edema Wound: incisions ok  Lab Results: Recent Labs    09/06/18 1700 09/07/18 0402  WBC 13.9* 13.8*  HGB 10.1* 9.4*  HCT 31.4* 30.2*  PLT 145* 170   BMET:  Recent Labs    09/06/18 0435 09/06/18 1659 09/06/18 1700 09/07/18 0402  NA 138 138  --  134*  K 4.6 4.5  --  4.6  CL 111 100  --  103  CO2 24  --   --  27  GLUCOSE 98 122*  --  123*  BUN <5* 6*  --  8  CREATININE 0.92 0.90 1.07 1.01  CALCIUM 8.3*  --   --  9.0    PT/INR:  Recent Labs    09/05/18 1344  LABPROT 17.6*  INR 1.46   ABG    Component Value Date/Time   PHART 7.335 (L) 09/05/2018 2214   HCO3 24.4 09/05/2018 2214   TCO2 27 09/06/2018 1659   ACIDBASEDEF 2.0 09/05/2018 2214   O2SAT 51.5 09/07/2018 0418   CBG (last 3)  Recent Labs    09/06/18 0729 09/06/18 1145 09/07/18 0731  GLUCAP 84 108* 102*   CLINICAL DATA:  History of CABG.  EXAM: PORTABLE CHEST 1 VIEW  COMPARISON:  09/06/2018  FINDINGS: Swan-Ganz catheter, left chest tube and mediastinal drain have been removed. Right internal jugular approach venous sheath remains. Loop recorder noted. Stable postsurgical changes from CABG.  Mildly enlarged cardiac silhouette. Tortuosity and calcific atherosclerotic disease of the aorta.  No evidence of pneumothorax. Small bilateral pleural effusions with bilateral lower lobe atelectasis versus airspace consolidation.  Osseous structures are without acute abnormality. Soft tissues are grossly normal.  IMPRESSION: Small bilateral pleural effusions with bilateral lower lobe atelectasis versus airspace consolidation.   Electronically Signed   By: Ted Mcalpine M.D.   On: 09/07/2018 07:55  Assessment/Plan: S/P Procedure(s) (LRB): CORONARY ARTERY BYPASS GRAFTING (CABG) times 3 using left internal mammary artery and right greater saphenous vein harvested endoscopically. (N/A) TRANSESOPHAGEAL ECHOCARDIOGRAM (TEE) (N/A)  POD 2 Hemodynamically stable in sinus rhythm. Continue low dose Lopressor. Co-ox was 51.5 this am off milrinone. He has normal LV function and mild to moderate RV dysfunction on his preop echo. He has been stable and is diuresing. Will repeat Co-ox in am to trend.   Volume excess: wt is 8 lbs over preop.  Continue diuresis.  IS, ambulation   LOS: 4 days    Alleen Borne 09/07/2018

## 2018-09-08 ENCOUNTER — Encounter (HOSPITAL_COMMUNITY): Payer: Self-pay | Admitting: Cardiothoracic Surgery

## 2018-09-08 ENCOUNTER — Inpatient Hospital Stay: Payer: Self-pay

## 2018-09-08 LAB — GLUCOSE, CAPILLARY
GLUCOSE-CAPILLARY: 120 mg/dL — AB (ref 70–99)
GLUCOSE-CAPILLARY: 124 mg/dL — AB (ref 70–99)

## 2018-09-08 LAB — CBC
HEMATOCRIT: 30.6 % — AB (ref 39.0–52.0)
Hemoglobin: 9.5 g/dL — ABNORMAL LOW (ref 13.0–17.0)
MCH: 32.5 pg (ref 26.0–34.0)
MCHC: 31 g/dL (ref 30.0–36.0)
MCV: 104.8 fL — AB (ref 80.0–100.0)
Platelets: 237 10*3/uL (ref 150–400)
RBC: 2.92 MIL/uL — ABNORMAL LOW (ref 4.22–5.81)
RDW: 15.4 % (ref 11.5–15.5)
WBC: 14.5 10*3/uL — AB (ref 4.0–10.5)
nRBC: 0 % (ref 0.0–0.2)

## 2018-09-08 LAB — TYPE AND SCREEN
ABO/RH(D): A POS
Antibody Screen: NEGATIVE
Unit division: 0
Unit division: 0
Unit division: 0
Unit division: 0

## 2018-09-08 LAB — BASIC METABOLIC PANEL
Anion gap: 6 (ref 5–15)
BUN: 12 mg/dL (ref 8–23)
CHLORIDE: 101 mmol/L (ref 98–111)
CO2: 26 mmol/L (ref 22–32)
CREATININE: 1.16 mg/dL (ref 0.61–1.24)
Calcium: 9.1 mg/dL (ref 8.9–10.3)
GFR calc non Af Amer: >60 mL/min (ref >60)
Glucose, Bld: 96 mg/dL (ref 70–99)
Potassium: 4.5 mmol/L (ref 3.5–5.1)
SODIUM: 133 mmol/L — AB (ref 135–145)

## 2018-09-08 LAB — BPAM RBC
Blood Product Expiration Date: 201911222359
Blood Product Expiration Date: 201911252359
Blood Product Expiration Date: 201911282359
Blood Product Expiration Date: 201911282359
ISSUE DATE / TIME: 201910262153
ISSUE DATE / TIME: 201911010913
ISSUE DATE / TIME: 201911010913
Unit Type and Rh: 6200
Unit Type and Rh: 6200
Unit Type and Rh: 6200
Unit Type and Rh: 6200

## 2018-09-08 LAB — COOXEMETRY PANEL
Carboxyhemoglobin: 1.5 % (ref 0.5–1.5)
Methemoglobin: 1.7 % — ABNORMAL HIGH (ref 0.0–1.5)
O2 SAT: 41.2 %
TOTAL HEMOGLOBIN: 10.1 g/dL — AB (ref 12.0–16.0)

## 2018-09-08 MED ORDER — MILRINONE LACTATE IN DEXTROSE 20-5 MG/100ML-% IV SOLN
0.1250 ug/kg/min | INTRAVENOUS | Status: DC
  Administered 2018-09-08 – 2018-09-09 (×3): 0.125 ug/kg/min via INTRAVENOUS
  Filled 2018-09-08 (×3): qty 100

## 2018-09-08 MED ORDER — BOOST PO LIQD
237.0000 mL | Freq: Three times a day (TID) | ORAL | Status: DC
  Administered 2018-09-08 (×2): 237 mL via ORAL
  Filled 2018-09-08 (×8): qty 237

## 2018-09-08 MED ORDER — LORAZEPAM 1 MG PO TABS
1.0000 mg | ORAL_TABLET | Freq: Four times a day (QID) | ORAL | Status: DC | PRN
  Administered 2018-09-09: 1 mg via ORAL
  Filled 2018-09-08: qty 1

## 2018-09-08 MED ORDER — CARVEDILOL 3.125 MG PO TABS
3.1250 mg | ORAL_TABLET | Freq: Two times a day (BID) | ORAL | Status: DC
  Administered 2018-09-08 – 2018-09-10 (×4): 3.125 mg via ORAL
  Filled 2018-09-08 (×3): qty 1

## 2018-09-08 MED ORDER — FUROSEMIDE 10 MG/ML IJ SOLN
40.0000 mg | Freq: Every day | INTRAMUSCULAR | Status: AC
  Administered 2018-09-08 – 2018-09-09 (×2): 40 mg via INTRAVENOUS
  Filled 2018-09-08 (×2): qty 4

## 2018-09-08 MED ORDER — ENOXAPARIN SODIUM 30 MG/0.3ML ~~LOC~~ SOLN
30.0000 mg | Freq: Every day | SUBCUTANEOUS | Status: DC
  Administered 2018-09-08 – 2018-09-11 (×4): 30 mg via SUBCUTANEOUS
  Filled 2018-09-08 (×4): qty 0.3

## 2018-09-08 NOTE — Consult Note (Signed)
            [facility] CM Primary Care Navigator  09/08/2018  Joe Durham 28-May-1946 [id number]   Attempt to see patient at the bedside to identify possible discharge needs but he was transferred from 6E 05 to [facility] 05 after surgery (CABG- coronary artery bypass grafting x 3)   Will attempt to follow-up and see patient for further [facility] needs when available and out of ICU.   For additional questions please contact:  Karin Golden A. Tori Cupps, BSN, RN-BC [facility] PRIMARY CARE Navigator Cell: [phone]

## 2018-09-08 NOTE — Progress Notes (Signed)
Patient ID: Joe Durham, male   DOB: 1945/11/10, 72 y.o.   MRN: [id number] EVENING ROUNDS NOTE :     [address]       [name] [address]             [phone]                 3 Days Post-Op Procedure(s) (LRB): CORONARY ARTERY BYPASS GRAFTING (CABG) times 3 using left internal mammary artery and right greater saphenous vein harvested endoscopically. (N/A) TRANSESOPHAGEAL ECHOCARDIOGRAM (TEE) (N/A)  Total Length of Stay:  LOS: 5 days  BP 111/84 (BP Location: Left Arm)   Pulse (!) 106   Temp 98.2 F (36.8 C) (Oral)   Resp 18   Ht 5\' 8"  (1.727 m)   Wt 54.7 kg   SpO2 90%   BMI 18.34 kg/m   .Intake/Output      11/03 0701 - 11/04 0700 11/04 0701 - 11/05 0700   P.O. 540 700   I.V. (mL/kg)  7.4 (0.1)   Total Intake(mL/kg) 540 (9.9) 707.4 (12.9)   Urine (mL/kg/hr) 2070 (1.6) 1200 (2.4)   Stool 0    Chest Tube     Total Output 2070 1200   Net -1530 -492.6        Stool Occurrence 3 x      . sodium chloride    . lactated ringers Stopped (09/05/18 1337)  . milrinone 0.125 mcg/kg/min (09/08/18 0817)     Lab Results  Component Value Date   WBC 14.5 (H) 09/08/2018   HGB 9.5 (L) 09/08/2018   HCT 30.6 (L) 09/08/2018   PLT 237 09/08/2018   GLUCOSE 96 09/08/2018   CHOL 173 03/27/2018   TRIG 49 03/27/2018   HDL 76 03/27/2018   LDLCALC 87 03/27/2018   ALT 14 09/04/2018   AST 25 09/04/2018   NA 133 (L) 09/08/2018   K 4.5 09/08/2018   CL 101 09/08/2018   CREATININE 1.16 09/08/2018   BUN 12 09/08/2018   CO2 26 09/08/2018   TSH 1.427 09/04/2018   INR 1.46 09/05/2018   HGBA1C 4.7 (L) 01/04/2015   Back on milrinone today Stable , waiting for pic line  Delight Ovens MD  Beeper [phone] Office [phone] 09/08/2018 4:19 PM

## 2018-09-08 NOTE — Progress Notes (Signed)
3 Days Post-Op Procedure(s) (LRB): CORONARY ARTERY BYPASS GRAFTING (CABG) times 3 using left internal mammary artery and right greater saphenous vein harvested endoscopically. (N/A) TRANSESOPHAGEAL ECHOCARDIOGRAM (TEE) (N/A) Subjective: Feels fatigued, sore Mixed venous saturation decreased to 43% after milrinone off.  Preop RV dysfunction and pulmonary hypertension Will resume IV milrinone, place PICC line, and keep in ICU today Pulmonary status stable with severe COPD and history of active smoking on nebulized therapy  Objective: Vital signs in last 24 hours: Temp:  [97.5 F (36.4 C)-98.8 F (37.1 C)] 98.8 F (37.1 C) (11/04 0751) Pulse Rate:  [78-99] 95 (11/04 1000) Cardiac Rhythm: Heart block (11/04 0400) Resp:  [4-30] 19 (11/04 1000) BP: (88-144)/(61-95) 103/61 (11/04 1000) SpO2:  [84 %-100 %] 91 % (11/04 1000) Weight:  [54.7 kg] 54.7 kg (11/04 0400)  Hemodynamic parameters for last 24 hours:  Sinus rhythm  Intake/Output from previous day: 11/03 0701 - 11/04 0700 In: 540 [P.O.:540] Out: 2070 [Urine:2070] Intake/Output this shift: Total I/O In: 363.3 [P.O.:360; I.V.:3.3] Out: 200 [Urine:200]  Alert and comfortable Distant breath sounds Sternum incision clean and dry Abdomen nontender Extremities with minimal edema Neuro intact  Lab Results: Recent Labs    09/07/18 0402 09/08/18 0426  WBC 13.8* 14.5*  HGB 9.4* 9.5*  HCT 30.2* 30.6*  PLT 170 237   BMET:  Recent Labs    09/07/18 0402 09/08/18 0426  NA 134* 133*  K 4.6 4.5  CL 103 101  CO2 27 26  GLUCOSE 123* 96  BUN 8 12  CREATININE 1.01 1.16  CALCIUM 9.0 9.1    PT/INR:  Recent Labs    09/05/18 1344  LABPROT 17.6*  INR 1.46   ABG    Component Value Date/Time   PHART 7.335 (L) 09/05/2018 2214   HCO3 24.4 09/05/2018 2214   TCO2 27 09/06/2018 1659   ACIDBASEDEF 2.0 09/05/2018 2214   O2SAT 41.2 09/08/2018 0435   CBG (last 3)  Recent Labs    09/06/18 1145 09/06/18 1650 09/07/18 0731   GLUCAP 108* 124* 102*    Assessment/Plan: S/P Procedure(s) (LRB): CORONARY ARTERY BYPASS GRAFTING (CABG) times 3 using left internal mammary artery and right greater saphenous vein harvested endoscopically. (N/A) TRANSESOPHAGEAL ECHOCARDIOGRAM (TEE) (N/A) Mobilize Resume milrinone Keep in ICU  LOS: 5 days    Joe Durham 09/08/2018

## 2018-09-08 NOTE — Progress Notes (Signed)
Nutrition Follow Up  DOCUMENTATION CODES:   Non-severe (moderate) malnutrition in context of chronic illness, Underweight  INTERVENTION:    Boost Plus po TID, each supplement provides 360 kcal and 14 grams of protein  NUTRITION DIAGNOSIS:   Moderate Malnutrition related to chronic illness(COPD) as evidenced by mild muscle depletion, moderate muscle depletion, mild fat depletion, moderate fat depletion, ongoing  GOAL:   Patient will meet greater than or equal to 90% of their needs, progressing  MONITOR:   PO intake, Supplement acceptance, Labs, Weight trends, Skin, I & O's  ASSESSMENT:   72 yo male with PMH of COPD, HTN, AV block, ad PVD who was admitted on 10/30 with NSTEMI.  Pt s/p procedures 11/1: CORONARY ARTERY BYPASS GRAFTING (CABG) x 3  Pt transferred from 6E-Progressive Care to 2H-ICU post-op. RN attending to patient at bedside at time of visit. Did not disturb. Spoke with RN at later time. PO intake has been good at 80% (average).  Pt is also drinking his Boost Plus nutrition supplements. Medications include Colace, Zofran and Protonix. Labs reviewed. Na 133 (L). Hgb 9.5 (L). CBG's [id number].  Diet Order:   Diet Order            Diet Heart Room service appropriate? Yes; Fluid consistency: Thin  Diet effective now             EDUCATION NEEDS:   No education needs have been identified at this time  Skin:  Skin Assessment: Skin Integrity Issues: Skin Integrity Issues:: Incisions Incisions: chest  Last BM:  11/4    Intake/Output Summary (Last 24 hours) at 09/08/2018 1542 Last data filed at 09/08/2018 1400 Gross per 24 hour  Intake 1007.44 ml  Output 2720 ml  Net -1712.56 ml   Height:   Ht Readings from Last 1 Encounters:  09/07/18 5\' 8"  (1.727 m)   Weight:   Wt Readings from Last 1 Encounters:  09/08/18 54.7 kg   ]Ideal Body Weight:  70 kg  BMI:  Body mass index is 18.34 kg/m.  Estimated Nutritional Needs:   Kcal:   1800-2000  Protein:  80-95 gm  Fluid:  1.8 L  Maureen Chatters, RD, LDN Pager #: [phone] After-Hours Pager #: [phone]

## 2018-09-08 NOTE — Anesthesia Postprocedure Evaluation (Signed)
Anesthesia Post Note  Patient: Joe Durham  Procedure(s) Performed: CORONARY ARTERY BYPASS GRAFTING (CABG) times 3 using left internal mammary artery and right greater saphenous vein harvested endoscopically. (N/A Chest) TRANSESOPHAGEAL ECHOCARDIOGRAM (TEE) (N/A )     Patient location during evaluation: SICU Anesthesia Type: General Level of consciousness: sedated Pain management: pain level controlled Vital Signs Assessment: post-procedure vital signs reviewed and stable Respiratory status: patient remains intubated per anesthesia plan Cardiovascular status: stable Postop Assessment: no apparent nausea or vomiting Anesthetic complications: no    Last Vitals:  Vitals:   09/08/18 1145 09/08/18 1200  BP: 123/69 117/64  Pulse: 87 (!) 103  Resp: 14 (!) 22  Temp: 36.7 C   SpO2: 90% (!) 88%    Last Pain:  Vitals:   09/08/18 1200  TempSrc:   PainSc: 0-No pain                 Avayah Raffety S

## 2018-09-09 ENCOUNTER — Inpatient Hospital Stay (HOSPITAL_COMMUNITY): Payer: Medicare Other

## 2018-09-09 LAB — COOXEMETRY PANEL
CARBOXYHEMOGLOBIN: 1.9 % — AB (ref 0.5–1.5)
METHEMOGLOBIN: 1.7 % — AB (ref 0.0–1.5)
O2 SAT: 57.4 %
TOTAL HEMOGLOBIN: 8.8 g/dL — AB (ref 12.0–16.0)

## 2018-09-09 LAB — BASIC METABOLIC PANEL
ANION GAP: 6 (ref 5–15)
BUN: 15 mg/dL (ref 8–23)
CHLORIDE: 100 mmol/L (ref 98–111)
CO2: 27 mmol/L (ref 22–32)
Calcium: 8.7 mg/dL — ABNORMAL LOW (ref 8.9–10.3)
Creatinine, Ser: 1.06 mg/dL (ref 0.61–1.24)
GFR calc Af Amer: >60 mL/min (ref >60)
Glucose, Bld: 85 mg/dL (ref 70–99)
POTASSIUM: 3.5 mmol/L (ref 3.5–5.1)
SODIUM: 133 mmol/L — AB (ref 135–145)

## 2018-09-09 LAB — CBC
HCT: 26 % — ABNORMAL LOW (ref 39.0–52.0)
Hemoglobin: 8.4 g/dL — ABNORMAL LOW (ref 13.0–17.0)
MCH: 33.1 pg (ref 26.0–34.0)
MCHC: 32.3 g/dL (ref 30.0–36.0)
MCV: 102.4 fL — ABNORMAL HIGH (ref 80.0–100.0)
Platelets: 295 10*3/uL (ref 150–400)
RBC: 2.54 MIL/uL — ABNORMAL LOW (ref 4.22–5.81)
RDW: 15.2 % (ref 11.5–15.5)
WBC: 13.5 10*3/uL — ABNORMAL HIGH (ref 4.0–10.5)
nRBC: 0 % (ref 0.0–0.2)

## 2018-09-09 MED ORDER — GUAIFENESIN ER 600 MG PO TB12
600.0000 mg | ORAL_TABLET | Freq: Two times a day (BID) | ORAL | Status: DC
  Administered 2018-09-09 – 2018-09-12 (×7): 600 mg via ORAL
  Filled 2018-09-09 (×7): qty 1

## 2018-09-09 MED ORDER — LORAZEPAM 1 MG PO TABS
1.0000 mg | ORAL_TABLET | Freq: Every day | ORAL | Status: DC
  Administered 2018-09-09 – 2018-09-11 (×3): 1 mg via ORAL
  Filled 2018-09-09 (×3): qty 1

## 2018-09-09 MED ORDER — THIAMINE HCL 100 MG/ML IJ SOLN
100.0000 mg | Freq: Every day | INTRAMUSCULAR | Status: DC
  Administered 2018-09-09 – 2018-09-11 (×3): 100 mg via INTRAVENOUS
  Filled 2018-09-09 (×3): qty 2

## 2018-09-09 MED ORDER — POTASSIUM CHLORIDE 10 MEQ/50ML IV SOLN
10.0000 meq | INTRAVENOUS | Status: AC
  Administered 2018-09-09 (×3): 10 meq via INTRAVENOUS
  Filled 2018-09-09: qty 50

## 2018-09-09 MED ORDER — POTASSIUM CHLORIDE 10 MEQ/50ML IV SOLN: 10.0000 meq | INTRAVENOUS | Status: DC | PRN

## 2018-09-09 MED ORDER — LEVOFLOXACIN 500 MG PO TABS
500.0000 mg | ORAL_TABLET | Freq: Every day | ORAL | Status: DC
  Administered 2018-09-09 – 2018-09-12 (×4): 500 mg via ORAL
  Filled 2018-09-09 (×4): qty 1

## 2018-09-09 MED ORDER — SODIUM CHLORIDE 0.9% FLUSH: 10.0000 mL | INTRAVENOUS | Status: DC | PRN

## 2018-09-09 MED ORDER — LEVALBUTEROL HCL 0.63 MG/3ML IN NEBU
0.6300 mg | INHALATION_SOLUTION | Freq: Two times a day (BID) | RESPIRATORY_TRACT | Status: DC
  Administered 2018-09-09 – 2018-09-12 (×5): 0.63 mg via RESPIRATORY_TRACT
  Filled 2018-09-09 (×6): qty 3

## 2018-09-09 MED ORDER — SODIUM CHLORIDE 0.9% FLUSH
10.0000 mL | Freq: Two times a day (BID) | INTRAVENOUS | Status: DC
  Administered 2018-09-09 – 2018-09-11 (×5): 10 mL

## 2018-09-09 MED ORDER — ADULT MULTIVITAMIN W/MINERALS CH
1.0000 | ORAL_TABLET | Freq: Every day | ORAL | Status: DC
  Administered 2018-09-10 – 2018-09-12 (×3): 1 via ORAL
  Filled 2018-09-09 (×3): qty 1

## 2018-09-09 MED FILL — Sodium Bicarbonate IV Soln 8.4%: INTRAVENOUS | Qty: 50 | Status: AC

## 2018-09-09 MED FILL — Sodium Chloride IV Soln 0.9%: INTRAVENOUS | Qty: 3000 | Status: AC

## 2018-09-09 MED FILL — Heparin Sodium (Porcine) Inj 1000 Unit/ML: INTRAMUSCULAR | Qty: 20 | Status: AC

## 2018-09-09 MED FILL — Electrolyte-R (PH 7.4) Solution: INTRAVENOUS | Qty: 3000 | Status: AC

## 2018-09-09 MED FILL — Lidocaine HCl(Cardiac) IV PF Soln Pref Syr 100 MG/5ML (2%): INTRAVENOUS | Qty: 5 | Status: AC

## 2018-09-09 MED FILL — Mannitol IV Soln 20%: INTRAVENOUS | Qty: 500 | Status: AC

## 2018-09-09 NOTE — Discharge Summary (Addendum)
Physician Discharge Summary       [address]       [name] [address]             [phone]    Patient ID: Joe Durham MRN: [id number] DOB/AGE: 1945/12/01 72 y.o.  Admit date: 09/03/2018 Discharge date: 09/12/2018  Admission Diagnoses: 1. NSTEMI (non-ST elevated myocardial infarction) (HCC) 2. Coronary artery disease  Discharge Diagnoses:  1. S/P CABG x 3 2. ABL anemia 3. 40-59% left internal carotid artery stenosis 4. History of Hyperlipidemia 5. History of Essential hypertension 6. History of embolic stroke 7. History of Peripheral vascular disease (HCC) 8. History of Paroxysmal atrial tachycardia (HCC) 9. History of COPD (chronic obstructive pulmonary disease) (HCC) 10. History of tobacco abuse 11. History of Bladder calculi 12. History of Mobitz type 1 second degree atrioventricular block 13. History of History of urinary retention 14. History of Osteoarthritis    Procedure (s):  LEFT HEART CATH AND CORONARY ANGIOGRAPHY by Dr. Kirke Corin on 09/03/2018:  Conclusion     Ost Cx to Prox Cx lesion is 99% stenosed.  Mid Cx to Dist Cx lesion is 80% stenosed.  Dist LM to Prox LAD lesion is 90% stenosed.  Ost 1st Diag lesion is 99% stenosed.  Prox RCA to Mid RCA lesion is 80% stenosed.  Dist RCA lesion is 60% stenosed.   1.  Severe heavily calcified three-vessel coronary artery disease.  The coronary arteries are very tortuous. 2.  Left ventricular angiography was not performed.  EF was normal by echo. 3.  Mildly elevated left ventricular end-diastolic pressure at 17 mmHg.  Recommendations: Recommend surgical consult for CABG.  PCI options are limited by severe calcifications and tortuosity that make atherectomy high risk for dissection and perforation. Resume heparin 8 hours after sheath pull.    1.  Coronary artery bypass grafting x3 (left internal mammary artery to left anterior descending, saphenous vein graft to obtuse marginal 1,  saphenous vein graft to posterior descending). 2.  Endoscopic harvest of right leg greater saphenous vein by Dr. Donata Clay on 09/05/2018.  History of Presenting Illness: The patient is a 72 year old male with a history of paroxysmal atrial tachycardia ( loop recorder placed March 2016), previous stroke in 2016 with right posterior cerebral artery embolism, previous MI, peripheral arterial disease with right leg claudication, hypertension, history of tobacco abuse and hyperlipidemia who presented to the emergency department with left-sided chest pain.  The pain would also radiate into the left arm.  The pain has been noted previously in short episodes lasting approximately 3 to 5 seconds however a day prior to presentation the pain lasted greater than 30 minutes.  During this episode the patient also had nausea and vomiting as well as diaphoresis.  He called EMS and was given 2 doses of sublingual nitroglycerin with some improvement in the chest pain.  Additionally he was given 4 baby aspirin and brought to the emergency department for further management.  Initial ECG revealed sinus rhythm with frequent PACs and poor R wave progression in the anterior precordial leads, and T wave inversions in the lateral leads.  Initial troponin high was 0.72.  The patient was admitted for further evaluation and treatment including cardiology consultation.  Echocardiogram and cardiac catheterization were done and the reports are listed below.  Due to the severity of the anatomical findings of his coronary artery disease we are asked to see the patient in cardiothoracic surgical consultation for consideration of coronary artery surgical re vascularization.  This is a 72 year old chronically ill single male smoker presents with non-STEMI, fairly well preserved LV function, and severe three-vessel coronary artery disease as well as peripheral vascular disease of the lower extremities.  He has been in chronic atrial fibrillation  and has had a previous embolic stroke from which he has recovered.  He has COPD and is an active smoker.  PCI would probably not benefit the patient because of coronary anatomy and tortuous vessels.  He appears to have adequate but not optimal targets of the LAD RCA and OM.  The patient is fairly debilitated and states he has lost 25 pounds over the past few months.  He is still able to ambulate fairly well as I walked with him out of his room up and down the hallway.  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 Dr. Donata Clay discussed the need for coronary artery bypass grafting surgery. Potential risks, benefits, and complications of the surgery were discussed with the patient and he agreed to proceed with surgery. Pre operative carotid duplex US showed no significant right internal carotid artery stenosis and a 40-59% left internal carotid artery stenosis. Patient underwent a CABG x 3 on 09/05/2018.  Brief Hospital Course:  The patient was extubated the evening of surgery. He remained afebrile and hemodynamically stable. He was initially paced. He was on Milrinone for several days post op. It was stopped for good on 11/06. Repeat co ox was stable at 53.6. Theone Murdoch, a line, chest tubes, and foley were removed early in the post operative course. Coreg was started and titrated accordingly. He was volume over loaded and diuresed. He had ABL anemia. He did not require a post op transfusion. Last H and H was 8.8 and 27.7.  He had a wet cough and was placed on oral Levaquin. Last WBC was down to 12,000. Per Dr. Donata Clay, he will receive 5 days of the oral antibiotic.  He was weaned off the insulin drip. He has no prior history of diabetes. The patient was felt surgically stable for transfer from the ICU to [facility] for further convalescence on 09/09/2018. He continues to progress with cardiac rehab. He was ambulating on room air. He  has been tolerating a diet and has had a bowel movement. Epicardial pacing wires were removed on 09/10/2018. Chest tube sutures and PICC line were removed the day of discharge. The patient lives alone;therefore, he will require SNF at discharge. He is surgically stable for discharge today.  We ask the SNF to please do the following: 1. Please obtain vital signs at least one time daily 2.Please weigh the patient daily. If he or she continues to gain weight or develops lower extremity edema, contact the office at [phone]. 3. Ambulate patient at least three times daily and please use sternal precautions.  Latest Vital Signs: Blood pressure 133/73, pulse (!) 102, temperature 98.6 F (37 C), temperature source Oral, resp. rate (!) 25, height 5\' 8"  (1.727 m), weight 51.7 kg, SpO2 98 %.  Physical Exam: Cardiovascular: RRR Pulmonary: Slightly decreased at bases Abdomen: Soft, non tender, bowel sounds present. Extremities:No lower extremity edema. Wounds: Clean and dry.  No erythema or signs of infection. Has suprapubic cath in place   Discharge Condition: Stable and discharged to home.  Recent laboratory studies:  Lab Results  Component Value Date   WBC 12.0 (H) 09/11/2018   HGB 8.8 (L) 09/11/2018  HCT 27.7 (L) 09/11/2018   MCV 102.6 (H) 09/11/2018   PLT 409 (H) 09/11/2018   Lab Results  Component Value Date   NA 134 (L) 09/12/2018   K 4.0 09/12/2018   CL 100 09/12/2018   CO2 28 09/12/2018   CREATININE 1.10 09/12/2018   GLUCOSE 95 09/12/2018    Diagnostic Studies: Dg Chest 2 View  Result Date: 09/10/2018 CLINICAL DATA:  Pleural effusion; s/p CABG 09/05/18; hx of COPD and HTN; smoker EXAM: CHEST - 2 VIEW COMPARISON:  09/09/2018 FINDINGS: Right PICC line tip: SVC. Atherosclerotic calcification of the aortic arch. Loop recorder noted. Prior CABG. Epicardial pacer leads noted. Small bilateral pleural effusions. Heart size within normal limits for projection. No pneumothorax.  IMPRESSION: 1. Small bilateral pleural effusions.  No overt edema. 2. Prior CABG. 3. PICC line tip: SVC. 4.  Aortic Atherosclerosis (ICD10-I70.0). Electronically Signed   By: Gaylyn Rong M.D.   On: 09/10/2018 10:12   Dg Chest 2 View  Result Date: 09/03/2018 CLINICAL DATA:  Initial evaluation for acute mid chest pain. EXAM: CHEST - 2 VIEW COMPARISON:  Prior CT from 01/05/2015. FINDINGS: Transverse heart size within normal limits. Prominence of the ascending aortic margin, which could reflect aneurysm. Mediastinal silhouette otherwise within normal limits. Lungs hyperinflated. No focal infiltrates. No pulmonary edema or pleural effusion. No pneumothorax. No acute osseous abnormality. IMPRESSION: 1. Hyperinflation with no other active cardiopulmonary disease identified. 2. Prominence of the ascending aortic contour. While this may be related to tortuosity, possible ascending aortic aneurysm could also be considered. Consider follow-up with CTA or MRA for further evaluation. Electronically Signed   By: Rise Mu M.D.   On: 09/03/2018 04:22   Dg Chest Port 1 View  Result Date: 09/09/2018 CLINICAL DATA:  Shortness of breath. EXAM: PORTABLE CHEST 1 VIEW COMPARISON:  Radiograph of September 07, 2018. FINDINGS: Stable cardiomediastinal silhouette. Status post coronary artery bypass graft. No pneumothorax is noted. Left basilar opacity is decreased compared to prior exam, suggesting improving atelectasis or infiltrate with associated pleural effusion. Right lung is clear. Bony thorax is unremarkable. IMPRESSION: Improved left basilar opacity as described above. Electronically Signed   By: Lupita Raider, M.D.   On: 09/09/2018 07:52   Dg Chest Port 1 View  Result Date: 09/07/2018 CLINICAL DATA:  History of CABG. EXAM: PORTABLE CHEST 1 VIEW COMPARISON:  09/06/2018 FINDINGS: Swan-Ganz catheter, left chest tube and mediastinal drain have been removed. Right internal jugular approach venous sheath  remains. Loop recorder noted. Stable postsurgical changes from CABG. Mildly enlarged cardiac silhouette. Tortuosity and calcific atherosclerotic disease of the aorta. No evidence of pneumothorax. Small bilateral pleural effusions with bilateral lower lobe atelectasis versus airspace consolidation. Osseous structures are without acute abnormality. Soft tissues are grossly normal. IMPRESSION: Small bilateral pleural effusions with bilateral lower lobe atelectasis versus airspace consolidation. Electronically Signed   By: Ted Mcalpine M.D.   On: 09/07/2018 07:55   Dg Chest Port 1 View  Result Date: 09/06/2018 CLINICAL DATA:  CABG yesterday. EXAM: PORTABLE CHEST 1 VIEW COMPARISON:  One-view chest x-ray 09/05/2018 FINDINGS: Patient has been extubated. A Swan-Ganz catheter remains in place. The tip is in the proximal right pulmonary artery. Bilateral chest tubes are stable. Mediastinal drain is in place. Bilateral pleural effusions, mild atelectasis, and pulmonary vascular congestion has increased slightly. IMPRESSION: 1. Interval extubation. 2. Slight increase in bilateral pleural effusions, atelectasis, and mild pulmonary vascular congestion. 3. Support apparatus is otherwise stable. Electronically Signed   By: Virl Son.D.  On: 09/06/2018 07:44   Dg Chest Port 1 View  Result Date: 09/05/2018 CLINICAL DATA:  Status post coronary bypass graft EXAM: PORTABLE CHEST 1 VIEW COMPARISON:  09/03/18 FINDINGS: Postsurgical changes are now seen. Endotracheal tube is noted in satisfactory position. Nasogastric catheter shows the proximal side port to lie in the distal esophagus. This could be advanced several cm. Bilateral thoracostomy catheters as well as a mediastinal drain and Swan-Ganz catheter are seen. No pneumothorax is noted. Minimal left basilar fluid is seen. IMPRESSION: Minimal left pleural effusion. Tubes and lines as described above. The nasogastric catheter could be advanced several cm  further into the stomach as necessary. No pneumothorax is seen. Electronically Signed   By: Alcide Clever M.D.   On: 09/05/2018 15:01   Vas US Doppler Pre Cabg  Result Date: 09/04/2018 PREOPERATIVE VASCULAR EVALUATION  Indications: Pre cabg. Performing Technologist: Blanch Media Supporting Technologist: Farrel Demark RDMS, RVT  Examination Guidelines: A complete evaluation includes B-mode imaging, spectral Doppler, color Doppler, and power Doppler as needed of all accessible portions of each vessel. Bilateral testing is considered an integral part of a complete examination. Limited examinations for reoccurring indications may be performed as noted.  Right Carotid Findings: +----------+--------+--------+--------+-----------+--------+           PSV cm/sEDV cm/sStenosisDescribe   Comments +----------+--------+--------+--------+-----------+--------+ CCA Prox  89      14              homogeneous         +----------+--------+--------+--------+-----------+--------+ CCA Distal71      15              homogeneous         +----------+--------+--------+--------+-----------+--------+ ICA Prox  80      25      1-39%   homogeneous         +----------+--------+--------+--------+-----------+--------+ ICA Distal65      26                                  +----------+--------+--------+--------+-----------+--------+ ECA       55                                          +----------+--------+--------+--------+-----------+--------+ Portions of this table do not appear on this page. +----------+--------+-------+--------+------------+           PSV cm/sEDV cmsDescribeArm Pressure +----------+--------+-------+--------+------------+ Subclavian232                    149          +----------+--------+-------+--------+------------+ +---------+--------+--+--------+-+---------+ VertebralPSV cm/s21EDV cm/s7Antegrade +---------+--------+--+--------+-+---------+ Left Carotid Findings:  +----------+--------+--------+--------+-----------+--------+           PSV cm/sEDV cm/sStenosisDescribe   Comments +----------+--------+--------+--------+-----------+--------+ CCA Prox  98      17              homogeneous         +----------+--------+--------+--------+-----------+--------+ CCA Distal60      15              homogeneous         +----------+--------+--------+--------+-----------+--------+ ICA Prox  148     48      40-59%  homogeneous         +----------+--------+--------+--------+-----------+--------+ ICA Mid   91      29                                  +----------+--------+--------+--------+-----------+--------+  ICA Distal93      23                                  +----------+--------+--------+--------+-----------+--------+ ECA       154     14                                  +----------+--------+--------+--------+-----------+--------+ +----------+--------+--------+--------+------------+ SubclavianPSV cm/sEDV cm/sDescribeArm Pressure +----------+--------+--------+--------+------------+           161                     148          +----------+--------+--------+--------+------------+ +---------+--------+--+--------+--+---------+ VertebralPSV cm/s61EDV cm/s33Antegrade +---------+--------+--+--------+--+---------+  ABI Findings: +--------+------------------+-----+-------------------+------------------------+ Right   Rt Pressure (mmHg)IndexWaveform           Comment                  +--------+------------------+-----+-------------------+------------------------+ [id number]                    triphasic                                   +--------+------------------+-----+-------------------+------------------------+ PTA                            dampened monophasicwaveform too dampened to                                                   obtain pressure           +--------+------------------+-----+-------------------+------------------------+ DP                                                not audible              +--------+------------------+-----+-------------------+------------------------+ +--------+------------------+-----+----------+-----------+ Left    Lt Pressure (mmHg)IndexWaveform  Comment     +--------+------------------+-----+----------+-----------+ [id number]                    triphasic             +--------+------------------+-----+----------+-----------+ PTA     56                0.38 monophasic            +--------+------------------+-----+----------+-----------+ DP                                       not audible +--------+------------------+-----+----------+-----------+ +-------+---------------+----------------+ ABI/TBIToday's ABI/TBIPrevious ABI/TBI +-------+---------------+----------------+ Left   0.5                             +-------+---------------+----------------+  Right Doppler Findings: +--------+--------+-----+---------+--------+ Site    PressureIndexDoppler  Comments +--------+--------+-----+---------+--------+ [id number]          triphasic         +--------+--------+-----+---------+--------+ Radial  triphasic         +--------+--------+-----+---------+--------+ Ulnar                triphasic         +--------+--------+-----+---------+--------+  Left Doppler Findings: +--------+--------+-----+---------+--------+ Site    PressureIndexDoppler  Comments +--------+--------+-----+---------+--------+ [id number]          triphasic         +--------+--------+-----+---------+--------+ Radial               triphasic         +--------+--------+-----+---------+--------+ Ulnar                triphasic         +--------+--------+-----+---------+--------+  Summary: Right Carotid: Velocities in the right ICA are consistent with a 1-39% stenosis. Left  Carotid: Velocities in the left ICA are consistent with a 40-59% stenosis. Vertebrals: Bilateral vertebral arteries demonstrate antegrade flow. Right Upper Extremity: Doppler waveform obliterate with right radial compression. Doppler waveforms decrease 50% with right ulnar compression. Left Upper Extremity: Doppler waveforms remain within normal limits with left radial compression. Doppler waveforms remain within normal limits with left ulnar compression.  Electronically signed by Sherald Hess MD on 09/04/2018 at 6:19:15 PM.    Final    [place] Ekg Site Rite  Result Date: 09/08/2018 If Site Rite image not attached, placement could not be confirmed due to current cardiac rhythm.        Discharge Medications: Allergies as of 09/12/2018      Reactions   Lactose Intolerance (gi) Diarrhea      Medication List    STOP taking these medications   amLODipine 10 MG tablet Commonly known as:  NORVASC     TAKE these medications   aspirin 325 MG EC tablet Take 1 tablet (325 mg total) by mouth daily. What changed:    medication strength  how much to take   atorvastatin 80 MG tablet Commonly known as:  LIPITOR Take 1 tablet (80 mg total) by mouth daily at 6 PM. What changed:    medication strength  how much to take   carvedilol 6.25 MG tablet Commonly known as:  COREG Take 1 tablet (6.25 mg total) by mouth 2 (two) times daily with a meal.   ferrous sulfate 325 (65 FE) MG tablet Take 1 tablet (325 mg total) by mouth daily with breakfast. For one month then stop.   guaiFENesin 600 MG 12 hr tablet Commonly known as:  MUCINEX Take 1 tablet (600 mg total) by mouth 2 (two) times daily as needed for cough or to loosen phlegm.   levofloxacin 500 MG tablet Commonly known as:  LEVAQUIN Take 1 tablet (500 mg total) by mouth daily. For one day then stop.   lisinopril 2.5 MG tablet Commonly known as:  PRINIVIL,ZESTRIL Take 1 tablet (2.5 mg total) by mouth daily.   multivitamin with  minerals Tabs tablet Take 1 tablet by mouth daily.   traMADol 50 MG tablet Commonly known as:  ULTRAM Take 1 tablet (50 mg total) by mouth every 4 (four) hours as needed for moderate pain.      The patient has been discharged on:   1.Beta Blocker:  Yes [ x  ]                              No   [   ]  If No, reason:  2.Ace Inhibitor/ARB: Yes [ x  ]                                     No  [    ]                                     If No, reason:  3.Statin:   Yes [  x                  No  [   ]                  If No, reason:  4.Ecasa:  Yes  [  x ]                  No   [   ]                  If No, reason:  Follow Up Appointments:  Contact information for follow-up providers    Kerin Perna, MD. Go on 10/08/2018.   Specialty:  Cardiothoracic Surgery Why:  PA/LAT CXR to be taken (at [facility] which is in the same building as Dr. Zenaida Niece Trigt's office) on 10/08/2018 at 2:30 pm;Appointment time is at 3:00 pm Contact information: [address] [employer] [address] [place] [address] [phone]        Azalee Course, [place]. Go on 09/25/2018.   Specialties:  Cardiology, Radiology Why:  Appointment time is at 10:30 am Contact information: [address] [place] [address] [phone]        Crista Elliot, MD. Call.   Specialty:  Urology Why:  for a follow up appointment. Has suprapubic catheter Contact information: [address] [place] [address] [phone]            Contact information for after-discharge care    Destination    [facility] .   Service:  Skilled Nursing Contact information: [address] [place] [address] [phone]                  Signed: Lelon Huh [facility] 09/12/2018, 8:33 AM   patient examined and medical record reviewed,agree with above note. Kathlee Nations Trigt III 09/19/2018

## 2018-09-09 NOTE — Progress Notes (Signed)
4 Days Post-Op Procedure(s) (LRB): CORONARY ARTERY BYPASS GRAFTING (CABG) times 3 using left internal mammary artery and right greater saphenous vein harvested endoscopically. (N/A) TRANSESOPHAGEAL ECHOCARDIOGRAM (TEE) (N/A) Subjective: Co-ox improved with low dose milrinone CXR clear - copd Wet cough nsr Objective: Vital signs in last 24 hours: Temp:  [97.7 F (36.5 C)-98.7 F (37.1 C)] 97.8 F (36.6 C) (11/05 1043) Pulse Rate:  [86-116] 116 (11/05 0900) Cardiac Rhythm: Sinus tachycardia;Heart block (11/05 1041) Resp:  [7-26] 20 (11/05 1043) BP: (93-133)/(53-101) 99/80 (11/05 1043) SpO2:  [90 %-100 %] 92 % (11/05 1043) Weight:  [53.1 kg] 53.1 kg (11/05 0600)  Hemodynamic parameters for last 24 hours:  stable  Intake/Output from previous day: 11/04 0701 - 11/05 0700 In: 1003.5 [P.O.:750; I.V.:153.5] Out: 1960 [Urine:1960] Intake/Output this shift: Total I/O In: 151.4 [I.V.:35.6; IV Piggyback:115.8] Out: 970 [Urine:970]       Exam    General- alert and comfortable    Neck- no JVD, no cervical adenopathy palpable, no carotid bruit   Lungs- clear without rales, wheezes   Cor- regular rate and rhythm, no murmur , gallop   Abdomen- soft, non-tender   Extremities - warm, non-tender, minimal edema   Neuro- oriented, appropriate, no focal weakness   Lab Results: Recent Labs    09/08/18 0426 09/09/18 0400  WBC 14.5* 13.5*  HGB 9.5* 8.4*  HCT 30.6* 26.0*  PLT 237 295   BMET:  Recent Labs    09/08/18 0426 09/09/18 0400  NA 133* 133*  K 4.5 3.5  CL 101 100  CO2 26 27  GLUCOSE 96 85  BUN 12 15  CREATININE 1.16 1.06  CALCIUM 9.1 8.7*    PT/INR: No results for input(s): LABPROT, INR in the last 72 hours. ABG    Component Value Date/Time   PHART 7.335 (L) 09/05/2018 2214   HCO3 24.4 09/05/2018 2214   TCO2 27 09/06/2018 1659   ACIDBASEDEF 2.0 09/05/2018 2214   O2SAT 57.4 09/09/2018 0410   CBG (last 3)  Recent Labs    09/06/18 1650 09/07/18 0731   GLUCAP 124* 102*    Assessment/Plan: S/P Procedure(s) (LRB): CORONARY ARTERY BYPASS GRAFTING (CABG) times 3 using left internal mammary artery and right greater saphenous vein harvested endoscopically. (N/A) TRANSESOPHAGEAL ECHOCARDIOGRAM (TEE) (N/A) Mobilize Diuresis pic line for milrinone , am Co-ox   LOS: 6 days    Joe Durham 09/09/2018

## 2018-09-09 NOTE — Progress Notes (Addendum)
[address]       [name] [address]             [phone]      4 Days Post-Op Procedure(s) (LRB): CORONARY ARTERY BYPASS GRAFTING (CABG) times 3 using left internal mammary artery and right greater saphenous vein harvested endoscopically. (N/A) TRANSESOPHAGEAL ECHOCARDIOGRAM (TEE) (N/A) Subjective: Feels ok, some mild nausea, pain control is pretty good  Objective: Vital signs in last 24 hours: Temp:  [97.7 F (36.5 C)-98.7 F (37.1 C)] 98.7 F (37.1 C) (11/05 0802) Pulse Rate:  [77-112] 104 (11/05 0600) Cardiac Rhythm: Heart block (11/05 0400) Resp:  [7-26] 7 (11/05 0600) BP: (93-133)/(53-101) 127/78 (11/05 0600) SpO2:  [88 %-100 %] 95 % (11/05 0600) Weight:  [53.1 kg] 53.1 kg (11/05 0600)  Hemodynamic parameters for last 24 hours:    Intake/Output from previous day: 11/04 0701 - 11/05 0700 In: 1003.5 [P.O.:750; I.V.:153.5] Out: 1960 [Urine:1960] Intake/Output this shift: No intake/output data recorded.  General appearance: alert, cooperative and no distress Heart: regular rate and rhythm and tachy Lungs: dim left base Abdomen: benign Extremities: no edema Wound: incis healing well  Lab Results: Recent Labs    09/08/18 0426 09/09/18 0400  WBC 14.5* 13.5*  HGB 9.5* 8.4*  HCT 30.6* 26.0*  PLT 237 295   BMET:  Recent Labs    09/08/18 0426 09/09/18 0400  NA 133* 133*  K 4.5 3.5  CL 101 100  CO2 26 27  GLUCOSE 96 85  BUN 12 15  CREATININE 1.16 1.06  CALCIUM 9.1 8.7*    PT/INR: No results for input(s): LABPROT, INR in the last 72 hours. ABG    Component Value Date/Time   PHART 7.335 (L) 09/05/2018 2214   HCO3 24.4 09/05/2018 2214   TCO2 27 09/06/2018 1659   ACIDBASEDEF 2.0 09/05/2018 2214   O2SAT 57.4 09/09/2018 0410   CBG (last 3)  Recent Labs    09/06/18 1145 09/06/18 1650 09/07/18 0731  GLUCAP 108* 124* 102*    Meds Scheduled Meds: . sodium chloride   Intravenous Once  . acetaminophen  1,000 mg Oral Q6H   . aspirin EC  325 mg Oral Daily  . atorvastatin  80 mg Oral q1800  . bisacodyl  10 mg Oral Daily  . carvedilol  3.125 mg Oral BID WC  . Chlorhexidine Gluconate Cloth  6 each Topical Daily  . docusate sodium  200 mg Oral Daily  . enoxaparin (LOVENOX) injection  30 mg Subcutaneous QHS  . furosemide  40 mg Intravenous Daily  . lactose free nutrition  237 mL Oral TID WC  . levalbuterol  0.63 mg Nebulization TID  . mouth rinse  15 mL Mouth Rinse BID  . pantoprazole  40 mg Oral Daily  . sodium chloride flush  10-40 mL Intracatheter Q12H  . sodium chloride flush  3 mL Intravenous Q12H   Continuous Infusions: . sodium chloride    . lactated ringers 10 mL/hr at 09/09/18 0600  . milrinone 0.125 mcg/kg/min (09/09/18 0600)   PRN Meds:.LORazepam, ondansetron (ZOFRAN) IV, oxyCODONE, sodium chloride flush, sodium chloride flush, traMADol  Xrays Dg Chest Port 1 View  Result Date: 09/09/2018 CLINICAL DATA:  Shortness of breath. EXAM: PORTABLE CHEST 1 VIEW COMPARISON:  Radiograph of September 07, 2018. FINDINGS: Stable cardiomediastinal silhouette. Status post coronary artery bypass graft. No pneumothorax is noted. Left basilar opacity is decreased compared to prior exam, suggesting improving atelectasis or infiltrate with associated pleural effusion. Right  lung is clear. Bony thorax is unremarkable. IMPRESSION: Improved left basilar opacity as described above. Electronically Signed   By: Lupita Raider, M.D.   On: 09/09/2018 07:52   [place] Ekg Site Rite  Result Date: 09/08/2018 If Site Rite image not attached, placement could not be confirmed due to current cardiac rhythm.   Assessment/Plan: S/P Procedure(s) (LRB): CORONARY ARTERY BYPASS GRAFTING (CABG) times 3 using left internal mammary artery and right greater saphenous vein harvested endoscopically. (N/A) TRANSESOPHAGEAL ECHOCARDIOGRAM (TEE) (N/A)  1 conts to do well 2 hemosyn stable, Coox 57.4, wean milrinone off 3 some intermit AVblock v  afib(?), current sinus tachy- monitor on current coreg dose 4 sats good on RA- cont pulm toilet/nebs for atx 5 d/c foley 6 good UOP, cont diuresis for now- wt approaching preop 7 replace K+ 8 renal fxn/GFR norma; 9 mild leukocytosis, no fevers- monitor 10 ABL anemia, down a little bit- monitor, not in trasfusion threshold. Probable chronic component as macrocytic- consider eval for B12/folate def- will add MVI for now 11 BS control is good 12 routine rehab    LOS: 6 days    Rowe Clack PA-C 09/09/2018 Pager [phone]  Not getting out of bed Wet cough w/ elevated WBC On levaquin 5 days  patient examined and medical record reviewed,agree with above note. Kathlee Nations Trigt III 09/10/2018

## 2018-09-09 NOTE — Progress Notes (Addendum)
CARDIAC REHAB PHASE I   PRE:  Rate/Rhythm: 115 ST PVCs short runs of ? afib  BP:  Supine: 136/95  Sitting:   Standing:    SaO2: 90-91%RA  MODE:  Ambulation: 300 ft   POST:  Rate/Rhythm: 114-130 ? afib vs ST with lots of PACs  BP:  Supine:   Sitting: 106/89  Standing:    SaO2: 93%RA  ? 86% RA when it first registered 1330-1410 Pt walked 300 ft on RA with gait belt use, rolling walker and asst x 2. Pt c/o dizziness toward end of walk but did not want to sit.  Assisted to bed and pt sat on side of bed for a few minutes before lying down. Difficult to tell if ST with long runs of PACs or atrial fib.  Pt stated he has walker he uses at home. Pt with generalized weakness. Pt lives alone and needs PT order.   Luetta Nutting, RN BSN  09/09/2018 2:05 PM

## 2018-09-09 NOTE — Progress Notes (Signed)
Peripherally Inserted Central Catheter/Midline Placement  The IV Nurse has discussed with the patient and/or persons authorized to consent for the patient, the purpose of this procedure and the potential benefits and risks involved with this procedure.  The benefits include less needle sticks, lab draws from the catheter, and the patient may be discharged home with the catheter. Risks include, but not limited to, infection, bleeding, blood clot (thrombus formation), and puncture of an artery; nerve damage and irregular heartbeat and possibility to perform a PICC exchange if needed/ordered by physician.  Alternatives to this procedure were also discussed.  Bard Power PICC patient education guide, fact sheet on infection prevention and patient information card has been provided to patient /or left at bedside.    PICC/Midline Placement Documentation  PICC Double Lumen 09/09/18 PICC Right Brachial 35 cm (Active)  Indication for Insertion or Continuance of Line Vasoactive infusions 09/09/2018 12:00 PM  Site Assessment Clean;Dry;Intact 09/09/2018 12:00 PM  Lumen #1 Status Flushed;Blood return noted 09/09/2018 12:00 PM  Lumen #2 Status Flushed;Blood return noted 09/09/2018 12:00 PM  Dressing Type Transparent 09/09/2018 12:00 PM  Dressing Status Clean;Dry;Intact;Antimicrobial disc in place 09/09/2018 12:00 PM  Dressing Change Due 09/16/18 09/09/2018 12:00 PM       Stacie Glaze Horton 09/09/2018, 12:39 PM

## 2018-09-10 ENCOUNTER — Inpatient Hospital Stay (HOSPITAL_COMMUNITY): Payer: Medicare Other

## 2018-09-10 LAB — BASIC METABOLIC PANEL
ANION GAP: 5 (ref 5–15)
BUN: 15 mg/dL (ref 8–23)
CALCIUM: 9.1 mg/dL (ref 8.9–10.3)
CO2: 30 mmol/L (ref 22–32)
Chloride: 100 mmol/L (ref 98–111)
Creatinine, Ser: 1.03 mg/dL (ref 0.61–1.24)
Glucose, Bld: 97 mg/dL (ref 70–99)
Potassium: 3.6 mmol/L (ref 3.5–5.1)
Sodium: 135 mmol/L (ref 135–145)

## 2018-09-10 LAB — COOXEMETRY PANEL
CARBOXYHEMOGLOBIN: 1.9 % — AB (ref 0.5–1.5)
Methemoglobin: 1.7 % — ABNORMAL HIGH (ref 0.0–1.5)
O2 SAT: 50.7 %
Total hemoglobin: 9 g/dL — ABNORMAL LOW (ref 12.0–16.0)

## 2018-09-10 MED ORDER — CARVEDILOL 6.25 MG PO TABS
6.2500 mg | ORAL_TABLET | Freq: Two times a day (BID) | ORAL | Status: DC
  Administered 2018-09-10 – 2018-09-12 (×4): 6.25 mg via ORAL
  Filled 2018-09-10 (×4): qty 1

## 2018-09-10 MED ORDER — POTASSIUM CHLORIDE CRYS ER 20 MEQ PO TBCR
40.0000 meq | EXTENDED_RELEASE_TABLET | Freq: Once | ORAL | Status: AC
  Administered 2018-09-10: 40 meq via ORAL
  Filled 2018-09-10: qty 2

## 2018-09-10 MED ORDER — CHLORHEXIDINE GLUCONATE CLOTH 2 % EX PADS
6.0000 | MEDICATED_PAD | Freq: Every day | CUTANEOUS | Status: DC
  Administered 2018-09-11: 6 via TOPICAL

## 2018-09-10 MED ORDER — POTASSIUM CHLORIDE CRYS ER 20 MEQ PO TBCR
30.0000 meq | EXTENDED_RELEASE_TABLET | Freq: Once | ORAL | Status: AC
  Administered 2018-09-10: 30 meq via ORAL
  Filled 2018-09-10: qty 1

## 2018-09-10 MED ORDER — FUROSEMIDE 40 MG PO TABS
40.0000 mg | ORAL_TABLET | Freq: Every day | ORAL | Status: DC
  Administered 2018-09-10 – 2018-09-12 (×3): 40 mg via ORAL
  Filled 2018-09-10 (×3): qty 1

## 2018-09-10 MED ORDER — FE FUMARATE-B12-VIT C-FA-IFC PO CAPS
1.0000 | ORAL_CAPSULE | Freq: Every day | ORAL | Status: DC
  Administered 2018-09-10 – 2018-09-12 (×3): 1 via ORAL
  Filled 2018-09-10 (×3): qty 1

## 2018-09-10 MED ORDER — ENSURE ENLIVE PO LIQD: 237.0000 mL | Freq: Three times a day (TID) | ORAL | Status: DC

## 2018-09-10 MED ORDER — POTASSIUM CHLORIDE CRYS ER 20 MEQ PO TBCR: 30.0000 meq | EXTENDED_RELEASE_TABLET | Freq: Once | ORAL | Status: DC

## 2018-09-10 MED ORDER — LEVALBUTEROL HCL 0.63 MG/3ML IN NEBU: 0.6300 mg | INHALATION_SOLUTION | Freq: Four times a day (QID) | RESPIRATORY_TRACT | Status: DC | PRN

## 2018-09-10 NOTE — Progress Notes (Signed)
CARDIAC REHAB PHASE  At door with RN   MODE:  Ambulation: 375 ft   POST:  Rate/Rhythm: 108 ST  BP:  Supine:   Sitting: 148/98  Standing:    SaO2: 97%RA 1422-1440 Pt up in room with RN getting ready to walk. Resumed walk with pt. He walked 375 ft on RA with rolling walker and asst x 1. Pt a little unsteady, gets very close to left side. No c/o dizziness. Tired to bed after walk.   Luetta Nutting, RN BSN  09/10/2018 2:37 PM

## 2018-09-10 NOTE — Care Management Important Message (Signed)
Important Message  Patient Details  Name: Joe Durham MRN: [id number] Date of Birth: 1946/05/05   Medicare Important Message Given:  Yes    [name] P [name] 09/10/2018, 3:34 PM

## 2018-09-10 NOTE — Progress Notes (Signed)
Nutrition Follow-up  DOCUMENTATION CODES:   Non-severe (moderate) malnutrition in context of chronic illness, Underweight  INTERVENTION:   -D/c Boost Plus TID -Ensure Enlive po TID, each supplement provides 350 kcal and 20 grams of protein -Continue MVI with minerals daily  NUTRITION DIAGNOSIS:   Moderate Malnutrition related to chronic illness(COPD) as evidenced by mild muscle depletion, moderate muscle depletion, mild fat depletion, moderate fat depletion.  Ongoing  GOAL:   Patient will meet greater than or equal to 90% of their needs  Progressing  MONITOR:   PO intake, Supplement acceptance, Labs, Weight trends, Skin, I & O's  REASON FOR ASSESSMENT:   Malnutrition Screening Tool, Consult Assessment of nutrition requirement/status  ASSESSMENT:   72 yo male with PMH of COPD, HTN, AV block, ad PVD who was admitted on 10/30 with NSTEMI.  11/1- s/p CORONARY ARTERY BYPASS GRAFTING (CABG) x 3 and TEE 11/5- PICC placed for milrinone   Reviewed I/O's: -780 ml x 24 hours and -1.7 L since admission.   Pt sleeping soundly at time of visit. RD did not disturb.   Meal intake has been variable; PO: 45-75%. Pt has refused his last 3 Boost Plus supplements.   Weight has increased since admission, which is favorable given malnutrition.   Per CVTS notes, plan to d/c to SNF once medically ready. PT consult pending.   Labs reviewed.   Diet Order:   Diet Order            Diet Heart Room service appropriate? Yes; Fluid consistency: Thin  Diet effective now              EDUCATION NEEDS:   No education needs have been identified at this time  Skin:  Skin Assessment: Skin Integrity Issues: Skin Integrity Issues:: Incisions Incisions: chest and rt leg  Last BM:  09/08/18  Height:   Ht Readings from Last 1 Encounters:  09/07/18 5\' 8"  (1.727 m)    Weight:   Wt Readings from Last 1 Encounters:  09/10/18 55.1 kg    Ideal Body Weight:  70 kg  BMI:  Body mass  index is 18.47 kg/m.  Estimated Nutritional Needs:   Kcal:  1800-2000  Protein:  80-95 gm  Fluid:  1.8 L    Ritika Hellickson A. Mayford Knife, RD, LDN, CDE Pager: [phone] After hours Pager: [phone]

## 2018-09-10 NOTE — Progress Notes (Addendum)
      [address]       [name] [address]             [phone]        5 Days Post-Op Procedure(s) (LRB): CORONARY ARTERY BYPASS GRAFTING (CABG) times 3 using left internal mammary artery and right greater saphenous vein harvested endoscopically. (N/A) TRANSESOPHAGEAL ECHOCARDIOGRAM (TEE) (N/A)  Subjective: Patient eating ice cream this am. He does not have a lot of appetite, but denies abdominal pain, nausea, or vomiting. He has already had a bowel movement.  Objective: Vital signs in last 24 hours: Temp:  [97.8 F (36.6 C)-98.7 F (37.1 C)] 98.5 F (36.9 C) (11/06 0421) Pulse Rate:  [95-116] 95 (11/05 1614) Cardiac Rhythm: Normal sinus rhythm;Heart block (11/05 1930) Resp:  [11-32] 27 (11/06 0421) BP: (99-146)/(71-95) 141/71 (11/06 0421) SpO2:  [90 %-96 %] 94 % (11/05 1952) Weight:  [55.1 kg] 55.1 kg (11/06 0422)  Pre op weight 53 kg Current Weight  09/10/18 55.1 kg      Intake/Output from previous day: 11/05 0701 - 11/06 0700 In: 190.5 [I.V.:74.7; IV Piggyback:115.8] Out: 970 [Urine:970]   Physical Exam:  Cardiovascular: Slightly tachy Pulmonary: Slightly decreased at left base and right lung is clear Abdomen: Soft, non tender, bowel sounds present. Extremities:Trace bilateral lower extremity edema. Wounds: Clean and dry.  No erythema or signs of infection. Has suprapubic cath in place  Lab Results: CBC: Recent Labs    09/08/18 0426 09/09/18 0400  WBC 14.5* 13.5*  HGB 9.5* 8.4*  HCT 30.6* 26.0*  PLT 237 295   BMET:  Recent Labs    09/09/18 0400 09/10/18 0426  NA 133* 135  K 3.5 3.6  CL 100 100  CO2 27 30  GLUCOSE 85 97  BUN 15 15  CREATININE 1.06 1.03  CALCIUM 8.7* 9.1    PT/INR:  Lab Results  Component Value Date   INR 1.46 09/05/2018   INR 1.08 09/04/2018   INR 0.98 03/20/2014   ABG:  INR: Will add last result for INR, ABG once components are confirmed Will add last 4 CBG results once components are  confirmed  Assessment/Plan:  1. CV - S/p NSTEMI. Appears to have a fib at times, ST at times. On Milrinone drip and Carvedilol 3.125 mg bid. Co ox this am up to 57.4. As discussed with Dr. Donata Clay, will increase Coreg and stop Milrinone 2.  Pulmonary - On room air. Will order PA/LAT CXR. Encourage incentive spirometer. 3. Volume Overload - Will give Lasix 40 mg daily 4.  Acute blood loss anemia - Last H and H stable at 8.4 and 26. On MVI 5. Supplement potassium 6. ID-on Levofloxacin for pulmonary etiology 7. Remove EPW 8. Patient lives alone-will  need SNF when ready for discharge  Lelon Huh [facility] 09/10/2018,7:21 AM [phone]

## 2018-09-10 NOTE — Progress Notes (Signed)
Pacer wires removed per order 13:00. H.R. Remained in Sinus rhythm throughout.

## 2018-09-10 NOTE — Consult Note (Signed)
            [facility] CM Primary Care Navigator  09/10/2018  Joe Durham [id number]   Went back to seepatient at the bedside toidentify possible discharge needs.  Patient reports that he had "sharp pain across chest" that had led to thisadmission/ surgery.(NSTEMI - non-ST elevated myocardial infarction, coronary artery disease, underwent cardiac cath, status post coronary artery bypass grafting x 3; acute blood loss anemia)  Patientreports having no primary care provider but endorses Dr. Thurmon Fair- cardiologist with [facility] as the primary provider that he follows-up with, and prefers to have him as primary provider, stating that it is him, whom he is used to see for years.    [facility] [address] [facility].  Patient states that he has beenmanaging his medications at home straight out of the containers. ("not taking that much")   Patient reports that his friend Joe Durham) has been providingtransportation to hisdoctors' appointments.  He verbalized living alone at home. His neighbors Joe Durham and Joe Durham) are people he can rely on to assist with needs if any. His sister Joe Durham- does not drive) may also be able to help out if needed.He states having a 16 year old daughter but she is in school.  Anticipated plan for dischargeis skilled nursing facility (SNF)for rehab per therapy recommendation.  Patientvoiced understandingto call cardiologist/ primaryprovider's office whenhereturnshome,for a post discharge follow-up visitwithin1- 2 weeksor sooner if needs arise.Patient letter (with PCP's contact number) was provided ashis reminder.   Discussed with patientabout[facility] CM services available for health management andresourcesat homeand he seemed interested with it.  Patientvoicedunderstanding todiscuss with cardiologist/ primary care provider onhis next  visit,aboutfurther needsand assistanceinmanaging his healthneeds and issues- when he getsback home. Patient expressedunderstandingto seekreferral from cardiologist/ primary care provider to [facility] care management asdeemed necessary and appropriatefor anyservicesin the nearfuture-oncehereturns tohome.   [facility] care management information was provided for future needsthat he may have.    For additional questions please contact:  Joe Durham, BSN, RN-BC [facility] PRIMARY CARE Navigator Cell: [phone]

## 2018-09-10 NOTE — Discharge Instructions (Signed)
We ask the SNF to please do the following: 1. Please obtain vital signs at least one time daily 2.Please weigh the patient daily. If he or she continues to gain weight or develops lower extremity edema, contact the office at [phone]. 3. Ambulate patient at least three times daily and please use sternal precautions.  Discharge Instructions:  1. You may shower, please wash incisions daily with soap and water and keep dry.  If you wish to cover wounds with dressing you may do so but please keep clean and change daily.  No tub baths or swimming until incisions have completely healed.  If your incisions become red or develop any drainage please call our office at [phone]  2. No Driving until cleared by Dr. Lucianne Lei Trigt's office and you are no longer using narcotic pain medications  3. Monitor your weight daily.. Please use the same scale and weigh at same time... If you gain 5-10 lbs in 48 hours with associated lower extremity swelling, please contact our office at [phone]  4. Fever of 101.5 for at least 24 hours with no source, please contact our office at [phone]  5. Activity- up as tolerated, please walk at least 3 times per day.  Avoid strenuous activity, no lifting, pushing, or pulling with your arms over 8-10 lbs for a minimum of 6 weeks  6. If any questions or concerns arise, please do not hesitate to contact our office at [phone]   Coronary Artery Bypass Grafting, Care After These instructions give you information on caring for yourself after your procedure. Your doctor may also give you more specific instructions. Call your doctor if you have any problems or questions after your procedure. Follow these instructions at home:  Only take medicine as told by your doctor. Take medicines exactly as told. Do not stop taking medicines or start any new medicines without talking to your doctor first.  Take your pulse as told by your doctor.  Do deep breathing as told by  your doctor. Use your breathing device (incentive spirometer), if given, to practice deep breathing several times a day. Support your chest with a pillow or your arms when you take deep breaths or cough.  Keep the area clean, dry, and protected where the surgery cuts (incisions) were made. Remove bandages (dressings) only as told by your doctor. If strips were applied to surgical area, do not take them off. They fall off on their own.  Check the surgery area daily for puffiness (swelling), redness, or leaking fluid.  If surgery cuts were made in your legs: ? Avoid crossing your legs. ? Avoid sitting for long periods of time. Change positions every 30 minutes. ? Raise your legs when you are sitting. Place them on pillows.  Wear stockings that help keep blood clots from forming in your legs (compression stockings).  Only take sponge baths until your doctor says it is okay to take showers. Pat the surgery area dry. Do not rub the surgery area with a washcloth or towel. Do not bathe, swim, or use a hot tub until your doctor says it is okay.  Eat foods that are high in fiber. These include raw fruits and vegetables, whole grains, beans, and nuts. Choose lean meats. Avoid canned, processed, and fried foods.  Drink enough fluids to keep your pee (urine) clear or pale yellow.  Weigh yourself every day.  Rest and limit activity as told by your doctor. You may be told to: ? Stop any activity if you  have chest pain, shortness of breath, changes in heartbeat, or dizziness. Get help right away if this happens. ? Move around often for short amounts of time or take short walks as told by your doctor. Gradually become more active. You may need help to strengthen your muscles and build endurance. ? Avoid lifting, pushing, or pulling anything heavier than 10 pounds (4.5 kg) for at least 6 weeks after surgery.  Do not drive until your doctor says it is okay.  Ask your doctor when you can go back to  work.  Ask your doctor when you can begin sexual activity again.  Follow up with your doctor as told. Contact a doctor if:  You have puffiness, redness, more pain, or fluid draining from the incision site.  You have a fever.  You have puffiness in your ankles or legs.  You have pain in your legs.  You gain 2 or more pounds (0.9 kg) a day.  You feel sick to your stomach (nauseous) or throw up (vomit).  You have watery poop (diarrhea). Get help right away if:  You have chest pain that goes to your jaw or arms.  You have shortness of breath.  You have a fast or irregular heartbeat.  You notice a "clicking" in your breastbone when you move.  You have numbness or weakness in your arms or legs.  You feel dizzy or light-headed. This information is not intended to replace advice given to you by your health care provider. Make sure you discuss any questions you have with your health care provider. Document Released: 10/27/2013 Document Revised: 03/29/2016 Document Reviewed: 03/31/2013 Elsevier Interactive Patient Education  2017 [employer].

## 2018-09-10 NOTE — Evaluation (Signed)
Physical Therapy Evaluation Patient Details Name: Joe Durham MRN: [id number] DOB: Jan 28, 1946 Today's Date: 09/10/2018   History of Present Illness  Pt is a 72 y/o male admitted secondary to NSTEMI. Pt is s/p CABG X3. PMH includes CAD and HTN.   Clinical Impression  Pt is s/p surgery above with deficits below. Pt limited by fatigue this session and only agreeable to standing at EOB. Required min A to stand and cues to maintain sternal precautions throughout mobility. Pt currently lives alone and feel pt will require increased assist at d/c. Will continue to follow acutely to maximize functional mobility independence and safety.     Follow Up Recommendations SNF;Supervision/Assistance - 24 hour    Equipment Recommendations  None recommended by PT    Recommendations for Other Services OT consult     Precautions / Restrictions Precautions Precautions: Sternal Precaution Booklet Issued: No Precaution Comments: Reviewed sternal precautions with pt and pt required cues not to reach back in bed.  Restrictions Weight Bearing Restrictions: Yes(sternal precautions)      Mobility  Bed Mobility Overal bed mobility: Needs Assistance Bed Mobility: Sidelying to Sit;Sit to Sidelying   Sidelying to sit: Min assist     Sit to sidelying: Supervision General bed mobility comments: Pt using momentum to help with sitting up. Educated about sternal precautions and how to maintain. Min A for trunk elevation.   Transfers Overall transfer level: Needs assistance Equipment used: Rolling walker (2 wheeled) Transfers: Sit to/from Stand Sit to Stand: Min assist         General transfer comment: Min A for steadying assist. Required momentum to stand. Verbal cues for hand placement to maintain precautions.   Ambulation/Gait             General Gait Details: Pt refusing secondary to fatigue. Did review importance of not pressing through UEs on RW during ambulation.   Stairs            Wheelchair Mobility    Modified Rankin (Stroke Patients Only)       Balance Overall balance assessment: Needs assistance Sitting-balance support: No upper extremity supported;Feet supported Sitting balance-Leahy Scale: Good     Standing balance support: No upper extremity supported;During functional activity Standing balance-Leahy Scale: Fair                               Pertinent Vitals/Pain Pain Assessment: Faces Faces Pain Scale: Hurts little more Pain Location: pacer wire removal sites Pain Descriptors / Indicators: Grimacing;Guarding Pain Intervention(s): Limited activity within patient's tolerance;Monitored during session;Repositioned    Home Living Family/patient expects to be discharged to:: Private residence Living Arrangements: Alone Available Help at Discharge: Family;Available PRN/intermittently Type of Home: Apartment Home Access: Stairs to enter Entrance Stairs-Rails: Doctor, general practice of Steps: 17 Home Layout: One level Home Equipment: Environmental consultant - 2 wheels      Prior Function Level of Independence: Independent               Hand Dominance        Extremity/Trunk Assessment   Upper Extremity Assessment Upper Extremity Assessment: Defer to OT evaluation    Lower Extremity Assessment Lower Extremity Assessment: Generalized weakness    Cervical / Trunk Assessment Cervical / Trunk Assessment: Kyphotic  Communication   Communication: No difficulties  Cognition Arousal/Alertness: Awake/alert Behavior During Therapy: WFL for tasks assessed/performed Overall Cognitive Status: Within Functional Limits for tasks assessed  General Comments      Exercises     Assessment/Plan    PT Assessment Patient needs continued PT services  PT Problem List Decreased strength;Decreased balance;Decreased mobility;Decreased activity tolerance;Decreased knowledge of use  of DME;Decreased knowledge of precautions       PT Treatment Interventions DME instruction;Gait training;Functional mobility training;Therapeutic activities;Stair training;Balance training;Therapeutic exercise;Patient/family education    PT Goals (Current goals can be found in the Care Plan section)  Acute Rehab PT Goals Patient Stated Goal: to go to sleep without being bothered PT Goal Formulation: With patient Time For Goal Achievement: 09/24/18 Potential to Achieve Goals: Good    Frequency Min 2X/week   Barriers to discharge Decreased caregiver support      Co-evaluation               AM-PAC PT "6 Clicks" Daily Activity  Outcome Measure Difficulty turning over in bed (including adjusting bedclothes, sheets and blankets)?: A Little Difficulty moving from lying on back to sitting on the side of the bed? : Unable Difficulty sitting down on and standing up from a chair with arms (e.g., wheelchair, bedside commode, etc,.)?: Unable Help needed moving to and from a bed to chair (including a wheelchair)?: A Little Help needed walking in hospital room?: A Little Help needed climbing 3-5 steps with a railing? : A Lot 6 Click Score: 13    End of Session   Activity Tolerance: Patient limited by fatigue Patient left: in bed;with call bell/phone within reach Nurse Communication: Mobility status PT Visit Diagnosis: Unsteadiness on feet (R26.81);Muscle weakness (generalized) (M62.81)    Time: 7893-8101 PT Time Calculation (min) (ACUTE ONLY): 13 min   Charges:   PT Evaluation $PT Eval Low Complexity: 1 Low          Gladys Damme, PT, DPT  Acute Rehabilitation Services  Pager: [phone] Office: [phone]   Lehman Prom 09/10/2018, 4:20 PM

## 2018-09-10 NOTE — Progress Notes (Signed)
1345 Checked with pt earlier to walk and RN had just walked with him. Now pt is resting after pacing wire removal. RN stated will walk later . Will continue to follow. Luetta Nutting RN BSN 09/10/2018 1:48 PM

## 2018-09-11 LAB — CBC
HEMATOCRIT: 27.7 % — AB (ref 39.0–52.0)
HEMOGLOBIN: 8.8 g/dL — AB (ref 13.0–17.0)
MCH: 32.6 pg (ref 26.0–34.0)
MCHC: 31.8 g/dL (ref 30.0–36.0)
MCV: 102.6 fL — ABNORMAL HIGH (ref 80.0–100.0)
Platelets: 409 10*3/uL — ABNORMAL HIGH (ref 150–400)
RBC: 2.7 MIL/uL — AB (ref 4.22–5.81)
RDW: 15.3 % (ref 11.5–15.5)
WBC: 12 10*3/uL — ABNORMAL HIGH (ref 4.0–10.5)
nRBC: 0.2 % (ref 0.0–0.2)

## 2018-09-11 LAB — BASIC METABOLIC PANEL
Anion gap: 5 (ref 5–15)
BUN: 10 mg/dL (ref 8–23)
CO2: 28 mmol/L (ref 22–32)
CREATININE: 0.97 mg/dL (ref 0.61–1.24)
Calcium: 9.3 mg/dL (ref 8.9–10.3)
Chloride: 101 mmol/L (ref 98–111)
GFR calc Af Amer: >60 mL/min (ref >60)
GLUCOSE: 92 mg/dL (ref 70–99)
Potassium: 4.1 mmol/L (ref 3.5–5.1)
SODIUM: 134 mmol/L — AB (ref 135–145)

## 2018-09-11 LAB — COOXEMETRY PANEL
Carboxyhemoglobin: 2 % — ABNORMAL HIGH (ref 0.5–1.5)
Methemoglobin: 1.7 % — ABNORMAL HIGH (ref 0.0–1.5)
O2 Saturation: 53.6 %
TOTAL HEMOGLOBIN: 9.3 g/dL — AB (ref 12.0–16.0)

## 2018-09-11 MED FILL — Magnesium Sulfate Inj 50%: INTRAMUSCULAR | Qty: 10 | Status: CN

## 2018-09-11 MED FILL — Potassium Chloride Inj 2 mEq/ML: INTRAVENOUS | Qty: 40 | Status: CN

## 2018-09-11 MED FILL — Heparin Sodium (Porcine) Inj 1000 Unit/ML: INTRAMUSCULAR | Qty: 30 | Status: CN

## 2018-09-11 NOTE — Progress Notes (Signed)
CARDIAC REHAB PHASE I   PRE:  Rate/Rhythm: 96 SR some PACs  BP:  Supine: 92/77  Sitting:   Standing:    SaO2: 95%RA  MODE:  Ambulation: 340 ft   POST:  Rate/Rhythm: 114 ST  BP:  Supine:   Sitting: 130/80  Standing:    SaO2: 97%RA 4098-1191 Pt walked 340 ft on RA with rolling walker and minimal asst. Encouraged pt to adhere to sternal precautions. Tires easily. To sitting on side of bed after walk. Set up breakfast.   Luetta Nutting, RN BSN  09/11/2018 9:36 AM

## 2018-09-11 NOTE — NC FL2 (Signed)
[place] MEDICAID FL2 LEVEL OF CARE SCREENING TOOL     IDENTIFICATION  Patient Name: Joe Durham Birthdate: [birth year] Sex: male Admission Date (Current Location): 09/03/2018  [facility] and [place] Number:  Producer, television/film/video and Address:  [facility], [address] [place], [place] [address]      Provider Number: [id number]  Attending Physician Name and Address:  Kerin Perna, MD  Relative Name and Phone Number:       Current Level of Care: Hospital Recommended Level of Care: Skilled Nursing Facility Prior Approval Number:    Date Approved/Denied:   PASRR Number: [id number]  Discharge Plan: SNF    Current Diagnoses: Patient Active Problem List   Diagnosis Date Noted  . S/P CABG x 3 09/05/2018  . Coronary artery disease 09/05/2018  . Coronary artery disease involving native heart without angina pectoris   . NSTEMI (non-ST elevated myocardial infarction) (HCC) 09/03/2018  . Malnutrition of moderate degree 09/03/2018  . History of loop recorder 01/23/2018  . Mobitz type 1 second degree AV block 07/30/2017  . PAD (peripheral artery disease) (HCC) 07/30/2017  . Bladder calculi 07/15/2017  . UTI (urinary tract infection) 06/13/2017  . Acute cystitis with hematuria   . Gross hematuria 06/12/2017  . Renal insufficiency 06/12/2017  . Complicated UTI (urinary tract infection) 06/12/2017  . Acute pyelonephritis 06/12/2017  . Uncomplicated alcohol dependence (HCC)   . Acute kidney injury (HCC)   . Claudication (HCC) 11/14/2016  . Encounter for loop recorder check 07/19/2015  . First degree AV block 07/19/2015  . PAT (paroxysmal atrial tachycardia) (HCC) 07/19/2015  . Essential hypertension 04/05/2015  . Tobacco use disorder 04/05/2015  . Cocaine abuse (HCC) 04/05/2015  . Hospital discharge follow-up 01/10/2015  . 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 Lung mass   . Acute embolic stroke (HCC)  01/04/2015  . Cerebral infarction due to unspecified mechanism   . Visual changes   . Substance abuse (HCC)   . Hyperlipidemia   . CVA (cerebral infarction) 01/03/2015  . Tobacco abuse 04/12/2014  . Bladder stones 04/12/2014    Orientation RESPIRATION BLADDER Height & Weight     Self, Time, Situation, Place  Normal Continent, Indwelling catheter(Suprapubic Catheter) Weight: 121 lb 7.6 oz (55.1 kg) Height:  5\' 8"  (172.7 cm)  BEHAVIORAL SYMPTOMS/MOOD NEUROLOGICAL BOWEL NUTRITION STATUS  (None) (None) Continent Diet(Heart healthy)  AMBULATORY STATUS COMMUNICATION OF NEEDS Skin   Limited Assist Verbally Surgical wounds, Other (Comment)(Catheter entry/exit.)                       Personal Care Assistance Level of Assistance  Feeding, Dressing, Bathing Bathing Assistance: Limited assistance Feeding assistance: Independent Dressing Assistance: Limited assistance     Functional Limitations Info  Sight, Hearing, Speech Sight Info: Adequate Hearing Info: Adequate Speech Info: Adequate    SPECIAL CARE FACTORS FREQUENCY  PT (By licensed PT), Blood pressure, OT (By licensed OT)     PT Frequency: 5 x week OT Frequency: 5 x week            Contractures Contractures Info: Not present    Additional Factors Info  Code Status, Allergies Code Status Info: Full code Allergies Info: Lactose Intolerance (Gi).           Current Medications (09/11/2018):  This is the current hospital active medication list Current Facility-Administered Medications  Medication Dose Route Frequency Provider Last Rate Last Dose  .  0.9 %  sodium chloride infusion (Manually program via Guardrails IV Fluids)   Intravenous Once Jed Limerick, CRNA      . 0.9 %  sodium chloride infusion  250 mL Intravenous Continuous Doree Fudge M, PA-C      . aspirin EC tablet 325 mg  325 mg Oral Daily Doree Fudge M, PA-C   325 mg at 09/11/18 1000  . atorvastatin (LIPITOR) tablet 80 mg  80 mg Oral  q1800 Ardelle Balls, PA-C   80 mg at 09/10/18 1734  . bisacodyl (DULCOLAX) EC tablet 10 mg  10 mg Oral Daily Doree Fudge M, PA-C   10 mg at 09/10/18 1552  . carvedilol (COREG) tablet 6.25 mg  6.25 mg Oral BID WC Doree Fudge M, PA-C   6.25 mg at 09/11/18 1000  . Chlorhexidine Gluconate Cloth 2 % PADS 6 each  6 each Topical Daily Kerin Perna, MD   6 each at 09/11/18 1005  . docusate sodium (COLACE) capsule 200 mg  200 mg Oral Daily Doree Fudge M, PA-C   200 mg at 09/10/18 0802  . enoxaparin (LOVENOX) injection 30 mg  30 mg Subcutaneous QHS Donata Clay, Theron Arista, MD   30 mg at 09/10/18 2034  . feeding supplement (ENSURE ENLIVE) (ENSURE ENLIVE) liquid 237 mL  237 mL Oral TID BM Donata Clay, Theron Arista, MD      . ferrous fumarate-b12-vitamic C-folic acid (TRINSICON / FOLTRIN) capsule 1 capsule  1 capsule Oral Daily Doree Fudge M, PA-C   1 capsule at 09/11/18 1000  . furosemide (LASIX) tablet 40 mg  40 mg Oral Daily Doree Fudge M, PA-C   40 mg at 09/11/18 1000  . guaiFENesin (MUCINEX) 12 hr tablet 600 mg  600 mg Oral BID Donata Clay, Theron Arista, MD   600 mg at 09/11/18 1000  . lactated ringers infusion   Intravenous Continuous Doree Fudge M, PA-C   Stopped at 09/09/18 0900  . levalbuterol (XOPENEX) nebulizer solution 0.63 mg  0.63 mg Nebulization BID Donata Clay, Theron Arista, MD   0.63 mg at 09/11/18 0758  . levalbuterol (XOPENEX) nebulizer solution 0.63 mg  0.63 mg Nebulization Q6H PRN Donata Clay, Theron Arista, MD      . levofloxacin [facility]) tablet 500 mg  500 mg Oral Daily Donata Clay, Theron Arista, MD   500 mg at 09/11/18 1000  . LORazepam (ATIVAN) tablet 1 mg  1 mg Oral QHS Donata Clay, Theron Arista, MD   1 mg at 09/10/18 2100  . MEDLINE mouth rinse  15 mL Mouth Rinse BID Donata Clay, Theron Arista, MD   15 mL at 09/11/18 1006  . multivitamin with minerals tablet 1 tablet  1 tablet Oral Daily Gold, Wayne E, PA-C   1 tablet at 09/11/18 1000  . ondansetron (ZOFRAN) injection 4 mg  4 mg Intravenous Q6H  PRN Doree Fudge M, PA-C   4 mg at 09/07/18 1225  . oxyCODONE (Oxy IR/ROXICODONE) immediate release tablet 5-10 mg  5-10 mg Oral Q3H PRN Ardelle Balls, PA-C   10 mg at 09/10/18 0423  . pantoprazole (PROTONIX) EC tablet 40 mg  40 mg Oral Daily Doree Fudge M, PA-C   40 mg at 09/11/18 1000  . sodium chloride flush (NS) 0.9 % injection 10-40 mL  10-40 mL Intracatheter Q12H Donata Clay, Theron Arista, MD   10 mL at 09/11/18 1006  . sodium chloride flush (NS) 0.9 % injection 10-40 mL  10-40 mL Intracatheter PRN Kerin Perna, MD      .  sodium chloride flush (NS) 0.9 % injection 10-40 mL  10-40 mL Intracatheter Q12H Kerin Perna, MD   10 mL at 09/10/18 2100  . sodium chloride flush (NS) 0.9 % injection 10-40 mL  10-40 mL Intracatheter PRN Donata Clay, Theron Arista, MD      . sodium chloride flush (NS) 0.9 % injection 3 mL  3 mL Intravenous Q12H Doree Fudge M, PA-C   3 mL at 09/09/18 2120  . sodium chloride flush (NS) 0.9 % injection 3 mL  3 mL Intravenous PRN Doree Fudge M, PA-C      . thiamine (B-1) injection 100 mg  100 mg Intravenous Daily Donata Clay, Theron Arista, MD   100 mg at 09/11/18 1003  . traMADol (ULTRAM) tablet 50 mg  50 mg Oral Q4H PRN Alleen Borne, MD   50 mg at 09/10/18 4098     Discharge Medications: Please see discharge summary for a list of discharge medications.  Relevant Imaging Results:  Relevant Lab Results:   Additional Information SS#: [id number]  Margarito Liner, LCSW

## 2018-09-11 NOTE — Clinical Social Work Note (Signed)
Clinical Social Work Assessment  Patient Details  Name: Joe Durham MRN: [id number] Date of Birth: 1946/09/25  Date of referral:  09/11/18               Reason for consult:  Facility Placement, Discharge Planning                Permission sought to share information with:  Facility Art therapist granted to share information::     Name::        Agency::  SNF's  Relationship::     Contact Information:     Housing/Transportation Living arrangements for the past 2 months:  Apartment Source of Information:  Patient, Medical Team Patient Interpreter Needed:  None Criminal Activity/Legal Involvement Pertinent to Current Situation/Hospitalization:  No - Comment as needed Significant Relationships:  Siblings Lives with:  Self Do you feel safe going back to the place where you live?  Yes Need for family participation in patient care:  Yes (Comment)  Care giving concerns:  PT recommending SNF once medically stable for discharge.   Social Worker assessment / plan:  CSW met with patient. No supports at bedside. Patient stated his mother passed away and his family is making funeral arrangements. CSW introduced role and explained that PT recommendations would be discussed. Patient is agreeable to SNF placement. First preference is [facility] because it is close to family and friends. CSW sent referral and left message for admissions coordinator asking her to start insurance authorization if they can take him. This typically takes 24 hours. No further concerns. CSW encouraged patient to contact CSW as needed. CSW will continue to follow patient for support and facilitate discharge to SNF once medically stable.  Employment status:  Retired Nurse, adult PT Recommendations:  [place] / Referral to community resources:  [place]  Patient/Family's Response to care:  Patient agreeable to SNF placement.  Patient's sister supportive and involved in patient's care. Patient appreciated social work intervention.  Patient/Family's Understanding of and Emotional Response to Diagnosis, Current Treatment, and Prognosis:  Patient has a good understanding of the reason for admission and his need for rehab prior to returning home alone. Patient appears pleased with hospital care.  Emotional Assessment Appearance:  Appears stated age Attitude/Demeanor/Rapport:  Engaged Affect (typically observed):  Accepting, Appropriate, Calm, Sad Orientation:  Oriented to Self, Oriented to Place, Oriented to  Time, Oriented to Situation Alcohol / Substance use:  Tobacco Use, Alcohol Use, Illicit Drugs Psych involvement (Current and /or in the community):  No (Comment)  Discharge Needs  Concerns to be addressed:  Care Coordination Readmission within the last 30 days:  No Current discharge risk:  Dependent with Mobility, Lives alone Barriers to Discharge:  Continued Medical Work up, [place], LCSW 09/11/2018, 12:34 PM

## 2018-09-11 NOTE — Progress Notes (Signed)
PT Cancellation Note  Patient Details Name: Joe Durham MRN: [id number] DOB: 05/19/1946   Cancelled Treatment:    Reason Eval/Treat Not Completed: Other (comment). Pt has mobilized multiple times today and just returned to room.   Angelina Ok [facility] 09/11/2018, 4:28 PM Skip Mayer PT Acute Rehabilitation Services Pager [phone] Office [phone]

## 2018-09-11 NOTE — Clinical Social Work Placement (Signed)
   CLINICAL SOCIAL WORK PLACEMENT  NOTE  Date:  09/11/2018  Patient Details  Name: Joe Durham MRN: [id number] Date of Birth: 1946-01-30  Clinical Social Work is seeking post-discharge placement for this patient at the Skilled  Nursing Facility level of care (*CSW will initial, date and re-position this form in  chart as items are completed):      Patient/family provided with [facility] Clinical Social Work Department's list of facilities offering this level of care within the geographic area requested by the patient (or if unable, by the patient's family).      Patient/family informed of their freedom to choose among providers that offer the needed level of care, that participate in Medicare, Medicaid or managed care program needed by the patient, have an available bed and are willing to accept the patient.      Patient/family informed of 's ownership interest in [facility] and [facility], as well as of the fact that they are under no obligation to receive care at these facilities.  PASRR submitted to EDS on 09/11/18     PASRR number received on 09/11/18     Existing PASRR number confirmed on       FL2 transmitted to all facilities in geographic area requested by pt/family on 09/11/18     FL2 transmitted to all facilities within larger geographic area on       Patient informed that his/her managed care company has contracts with or will negotiate with certain facilities, including the following:            Patient/family informed of bed offers received.  Patient chooses bed at       Physician recommends and patient chooses bed at      Patient to be transferred to   on  .  Patient to be transferred to facility by       Patient family notified on   of transfer.  Name of family member notified:        PHYSICIAN Please sign FL2     Additional Comment:    _______________________________________________ Margarito Liner, LCSW 09/11/2018, 12:37  PM

## 2018-09-11 NOTE — Progress Notes (Addendum)
      [address]       [place] [address]             [phone]        6 Days Post-Op Procedure(s) (LRB): CORONARY ARTERY BYPASS GRAFTING (CABG) times 3 using left internal mammary artery and right greater saphenous vein harvested endoscopically. (N/A) TRANSESOPHAGEAL ECHOCARDIOGRAM (TEE) (N/A)  Subjective: Patient just waking up. He has no specific complaints this am.  Objective: Vital signs in last 24 hours: Temp:  [98 F (36.7 C)-98.4 F (36.9 C)] 98.2 F (36.8 C) (11/07 0007) Pulse Rate:  [96-101] 96 (11/06 1200) Cardiac Rhythm: Normal sinus rhythm (11/07 0400) Resp:  [17-22] 22 (11/07 0300) BP: (103-132)/(68-87) 130/86 (11/07 0300) SpO2:  [95 %-98 %] 98 % (11/06 1937)  Pre op weight 53 kg Current Weight  09/10/18 55.1 kg      Intake/Output from previous day: 11/06 0701 - 11/07 0700 In: 490.2 [P.O.:480; I.V.:10.2] Out: 375 [Urine:375]   Physical Exam:  Cardiovascular: RRR Pulmonary: Slightly decreased at bases Abdomen: Soft, non tender, bowel sounds present. Extremities:No lower extremity edema. Wounds: Clean and dry.  No erythema or signs of infection. Has suprapubic cath in place  Lab Results: CBC: Recent Labs    09/09/18 0400 09/11/18 0457  WBC 13.5* 12.0*  HGB 8.4* 8.8*  HCT 26.0* 27.7*  PLT 295 409*   BMET:  Recent Labs    09/10/18 0426 09/11/18 0457  NA 135 134*  K 3.6 4.1  CL 100 101  CO2 30 28  GLUCOSE 97 92  BUN 15 10  CREATININE 1.03 0.97  CALCIUM 9.1 9.3    PT/INR:  Lab Results  Component Value Date   INR 1.46 09/05/2018   INR 1.08 09/04/2018   INR 0.98 03/20/2014   ABG:  INR: Will add last result for INR, ABG once components are confirmed Will add last 4 CBG results once components are confirmed  Assessment/Plan:  1. CV - S/p NSTEMI. First degree heart block, mostly SR. On Carvedilol 6.25 mg bid. Co ox (off Milrinone since yesterday morning) is 53.6 2.  Pulmonary - On room air.  Continue  Xopenex, Mucinex. Encourage incentive spirometer. 3. Volume Overload - Will give Lasix 40 mg daily 4.  Acute blood loss anemia - Last H and H stable at 8.8 and 27.7. On MVI 5. Supplement potassium 6. ID-WBC decreased to 12,000. On 7. GU-has suprapubic catheter in place  Levofloxacin for pulmonary etiology 8. Nutrition-appreciate assistance. On Ensure 9. Patient lives alone-will  need SNF. Awaiting bed placement  Donielle M ZimmermanPA-C 09/11/2018,7:07 AM [phone]  Needs more mobilization nsr Cardiac output marginal off miltrinone[ due to RV dysfx/COPD]. Keep on 2 C and recheck co-ox in am Cont po Fe, vits and gentle diuresis  patient examined and medical record reviewed,agree with above note. Kathlee Nations Trigt III 09/11/2018

## 2018-09-11 NOTE — Clinical Social Work Note (Addendum)
[facility] is reviewing referral. They will call CSW with decision.  Charlynn Court, CSW [phone]  2:38 pm [facility] can take patient once insurance authorization approved. Will hopefully have it tomorrow afternoon. Patient aware and agreeable.  Charlynn Court, CSW [phone]

## 2018-09-11 NOTE — Care Management Note (Signed)
Case Management Note Previous Note Created by Mable Fill  Patient Details  Name: Joe Durham MRN: [id number] Date of Birth: 1946/01/17  Subjective/Objective: Pt presented for Nstemi- PTA from home alone- pt has RW, and Cane. Patient states he lives in an apartment on 2nd floor and has to go up 17 stairs. Patient states he was seeing urology:  Dr Modena Slater with [facility] will need follow up appointment scheduled. Patient stated he missed his last appointment for urology follow up-Pt has a suprapubic catheter.                  Action/Plan: CVTS following for CABG- hx of 3V CAD from Cath. CM will continue to monitor for additional needs.   Expected Discharge Date:                  Expected Discharge Plan:  Home w Home Health Services  In-House Referral:     Discharge planning Services  CM Consult  Post Acute Care Choice:    Choice offered to:     DME Arranged:    DME Agency:     HH Arranged:    HH Agency:     Status of Service:  In process, will continue to follow  If discussed at Long Length of Stay Meetings, dates discussed:    Additional Comments: 09/11/2018 SNF recommended due to lack of support at discharge - CSW actively following Cherylann Parr, RN 09/11/2018, 3:14 PM

## 2018-09-11 NOTE — Evaluation (Signed)
Occupational Therapy Evaluation Patient Details Name: Joe Durham MRN: [id number] DOB: Mar 29, 1946 Today's Date: 09/11/2018    History of Present Illness Pt is a 72 y/o male admitted secondary to NSTEMI. Pt is s/p CABG X3. PMH includes CAD and HTN.    Clinical Impression   Pt is typically independent. Presents with generalized weakness, decreased activity tolerance and difficulty recalling sternal precautions. He requires set up to min assist for ADL and min guard assist for mobility. Pt lives alone and will need post acute rehab in SNF prior to return home. Will follow acutely.    Follow Up Recommendations  SNF    Equipment Recommendations       Recommendations for Other Services       Precautions / Restrictions Precautions Precautions: Sternal;Fall Precaution Comments: educated in sternal precautions, cues needed to generalize      Mobility Bed Mobility Overal bed mobility: Needs Assistance Bed Mobility: Rolling;Sidelying to Sit Rolling: Supervision Sidelying to sit: Supervision       General bed mobility comments: no physical assist, cues for technique  Transfers Overall transfer level: Needs assistance   Transfers: Sit to/from Stand;Squat Pivot Transfers Sit to Stand: Min guard   Squat pivot transfers: Min guard     General transfer comment: cues for hands on knees    Balance Overall balance assessment: Needs assistance   Sitting balance-Leahy Scale: Good       Standing balance-Leahy Scale: Fair                             ADL either performed or assessed with clinical judgement   ADL Overall ADL's : Needs assistance/impaired Eating/Feeding: Independent;Sitting   Grooming: Wash/dry hands;Standing;Set up   Upper Body Bathing: Minimal assistance;Sitting   Lower Body Bathing: Minimal assistance;Sit to/from stand   Upper Body Dressing : Minimal assistance;Sitting   Lower Body Dressing: Minimal assistance;Sit to/from stand    Toilet Transfer: Min guard;Squat-pivot;BSC   Toileting- Architect and Hygiene: Min guard;Sit to/from stand               Vision Baseline Vision/History: Wears glasses Patient Visual Report: No change from baseline Additional Comments: pt with difficulty reading small print even with glasses     Perception     Praxis      Pertinent Vitals/Pain Pain Assessment: Faces Faces Pain Scale: Hurts little more Pain Location: incision Pain Descriptors / Indicators: Grimacing;Guarding Pain Intervention(s): Monitored during session;Repositioned(encouraged use of heart pillow)     Hand Dominance Right   Extremity/Trunk Assessment Upper Extremity Assessment Upper Extremity Assessment: Overall WFL for tasks assessed(no formally assessed due to sternal precautions)   Lower Extremity Assessment Lower Extremity Assessment: Defer to PT evaluation   Cervical / Trunk Assessment Cervical / Trunk Assessment: Kyphotic   Communication Communication Communication: No difficulties   Cognition Arousal/Alertness: Awake/alert Behavior During Therapy: WFL for tasks assessed/performed Overall Cognitive Status: Within Functional Limits for tasks assessed                                     General Comments       Exercises     Shoulder Instructions      Home Living Family/patient expects to be discharged to:: Private residence Living Arrangements: Alone Available Help at Discharge: Family;Available PRN/intermittently Type of Home: Apartment Home Access: Stairs to enter Entrance Stairs-Number of Steps: 17 Entrance Stairs-Rails:  Right;Left Home Layout: One level     Bathroom Shower/Tub: Walk-in shower;Tub/shower unit   Bathroom Toilet: Standard     Home Equipment: Walker - 2 wheels          Prior Functioning/Environment Level of Independence: Independent                 OT Problem List: Decreased activity tolerance;Impaired balance  (sitting and/or standing);Decreased strength;Decreased knowledge of precautions;Decreased knowledge of use of DME or AE;Pain      OT Treatment/Interventions: Self-care/ADL training;DME and/or AE instruction;Patient/family education;Balance training;Therapeutic activities    OT Goals(Current goals can be found in the care plan section) Acute Rehab OT Goals Patient Stated Goal: to go home OT Goal Formulation: With patient Time For Goal Achievement: 09/25/18 Potential to Achieve Goals: Good ADL Goals Pt Will Perform Grooming: standing;with supervision Pt Will Perform Upper Body Dressing: with set-up;sitting Pt Will Perform Lower Body Dressing: with supervision;sit to/from stand Pt Will Transfer to Toilet: with supervision;ambulating;bedside commode(over toilet) Pt Will Perform Toileting - Clothing Manipulation and hygiene: with supervision;sit to/from stand Additional ADL Goal #1: Pt will generalize sternal precautions during mobility and ADL independently.  OT Frequency: Min 2X/week   Barriers to D/C: Decreased caregiver support          Co-evaluation              AM-PAC PT "6 Clicks" Daily Activity     Outcome Measure Help from another person eating meals?: None Help from another person taking care of personal grooming?: A Little Help from another person toileting, which includes using toliet, bedpan, or urinal?: A Little Help from another person bathing (including washing, rinsing, drying)?: A Little Help from another person to put on and taking off regular upper body clothing?: A Little Help from another person to put on and taking off regular lower body clothing?: A Little 6 Click Score: 19   End of Session Equipment Utilized During Treatment: Gait belt  Activity Tolerance: Patient tolerated treatment well Patient left: in bed;with call bell/phone within reach  OT Visit Diagnosis: Unsteadiness on feet (R26.81);Pain;Muscle weakness (generalized) (M62.81)                 Time: 6144-3154 OT Time Calculation (min): 23 min Charges:  OT General Charges $OT Visit: 1 Visit OT Evaluation $OT Eval Moderate Complexity: 1 Mod OT Treatments $Self Care/Home Management : 8-22 mins  Martie Round, OTR/L Acute Rehabilitation Services Pager: [phone] Office: [phone]  Evern Bio 09/11/2018, 1:03 PM

## 2018-09-12 DIAGNOSIS — E782 Mixed hyperlipidemia: Secondary | ICD-10-CM

## 2018-09-12 LAB — BASIC METABOLIC PANEL
Anion gap: 6 (ref 5–15)
BUN: 9 mg/dL (ref 8–23)
CALCIUM: 9.4 mg/dL (ref 8.9–10.3)
CO2: 28 mmol/L (ref 22–32)
Chloride: 100 mmol/L (ref 98–111)
Creatinine, Ser: 1.1 mg/dL (ref 0.61–1.24)
GFR calc Af Amer: >60 mL/min (ref >60)
GLUCOSE: 95 mg/dL (ref 70–99)
POTASSIUM: 4 mmol/L (ref 3.5–5.1)
SODIUM: 134 mmol/L — AB (ref 135–145)

## 2018-09-12 MED ORDER — ADULT MULTIVITAMIN W/MINERALS CH: 1.0000 | ORAL_TABLET | Freq: Every day | ORAL | Status: DC

## 2018-09-12 MED ORDER — TRAMADOL HCL 50 MG PO TABS: 50.0000 mg | ORAL_TABLET | ORAL | 0 refills | Status: DC | PRN

## 2018-09-12 MED ORDER — LISINOPRIL 2.5 MG PO TABS
2.5000 mg | ORAL_TABLET | Freq: Every day | ORAL | Status: DC
  Administered 2018-09-12: 2.5 mg via ORAL
  Filled 2018-09-12: qty 1

## 2018-09-12 MED ORDER — ATORVASTATIN CALCIUM 80 MG PO TABS: 80.0000 mg | ORAL_TABLET | Freq: Every day | ORAL | 1 refills | Status: DC

## 2018-09-12 MED ORDER — VITAMIN B-1 100 MG PO TABS
100.0000 mg | ORAL_TABLET | Freq: Every day | ORAL | Status: DC
  Administered 2018-09-12: 100 mg via ORAL
  Filled 2018-09-12: qty 1

## 2018-09-12 MED ORDER — LISINOPRIL 2.5 MG PO TABS: 2.5000 mg | ORAL_TABLET | Freq: Every day | ORAL | 1 refills | Status: DC

## 2018-09-12 MED ORDER — ASPIRIN 325 MG PO TBEC: 325.0000 mg | DELAYED_RELEASE_TABLET | Freq: Every day | ORAL | 0 refills | Status: DC

## 2018-09-12 MED ORDER — CARVEDILOL 6.25 MG PO TABS: 6.2500 mg | ORAL_TABLET | Freq: Two times a day (BID) | ORAL | 1 refills | Status: DC

## 2018-09-12 MED ORDER — GUAIFENESIN ER 600 MG PO TB12: 600.0000 mg | ORAL_TABLET | Freq: Two times a day (BID) | ORAL | Status: DC | PRN

## 2018-09-12 MED ORDER — FERROUS SULFATE 325 (65 FE) MG PO TABS: 325.0000 mg | ORAL_TABLET | Freq: Every day | ORAL | 3 refills | Status: DC

## 2018-09-12 MED ORDER — LEVOFLOXACIN 500 MG PO TABS: 500.0000 mg | ORAL_TABLET | Freq: Every day | ORAL | 0 refills | Status: DC

## 2018-09-12 MED FILL — Heparin Sodium (Porcine) Inj 1000 Unit/ML: INTRAMUSCULAR | Qty: 30 | Status: AC

## 2018-09-12 MED FILL — Potassium Chloride Inj 2 mEq/ML: INTRAVENOUS | Qty: 40 | Status: AC

## 2018-09-12 MED FILL — Magnesium Sulfate Inj 50%: INTRAMUSCULAR | Qty: 10 | Status: AC

## 2018-09-12 NOTE — Progress Notes (Addendum)
      [address]       [place] [address]             [phone]        7 Days Post-Op Procedure(s) (LRB): CORONARY ARTERY BYPASS GRAFTING (CABG) times 3 using left internal mammary artery and right greater saphenous vein harvested endoscopically. (N/A) TRANSESOPHAGEAL ECHOCARDIOGRAM (TEE) (N/A)  Subjective: Patient just waking up. He has no specific complaints this am.  Objective: Vital signs in last 24 hours: Temp:  [98.2 F (36.8 C)-98.7 F (37.1 C)] 98.6 F (37 C) (11/08 0700) Pulse Rate:  [102] 102 (11/08 0700) Cardiac Rhythm: Normal sinus rhythm (11/08 0500) Resp:  [20-25] 25 (11/08 0700) BP: (75-134)/(64-88) 133/73 (11/08 0700) SpO2:  [94 %-98 %] 98 % (11/07 1935) Weight:  [51.7 kg] 51.7 kg (11/08 0500)  Pre op weight 53 kg Current Weight  09/12/18 51.7 kg      Intake/Output from previous day: 11/07 0701 - 11/08 0700 In: 720 [P.O.:720] Out: 1000 [Urine:1000]   Physical Exam:  Cardiovascular: RRR Pulmonary: Slightly decreased at bases Abdomen: Soft, non tender, bowel sounds present. Extremities:No lower extremity edema. Wounds: Clean and dry.  No erythema or signs of infection. Has suprapubic cath in place  Lab Results: CBC: Recent Labs    09/11/18 0457  WBC 12.0*  HGB 8.8*  HCT 27.7*  PLT 409*   BMET:  Recent Labs    09/11/18 0457 09/12/18 0612  NA 134* 134*  K 4.1 4.0  CL 101 100  CO2 28 28  GLUCOSE 92 95  BUN 10 9  CREATININE 0.97 1.10  CALCIUM 9.3 9.4    PT/INR:  Lab Results  Component Value Date   INR 1.46 09/05/2018   INR 1.08 09/04/2018   INR 0.98 03/20/2014   ABG:  INR: Will add last result for INR, ABG once components are confirmed Will add last 4 CBG results once components are confirmed  Assessment/Plan:  1. CV - S/p NSTEMI. First degree heart block, mostly SR. On Carvedilol 6.25 mg bid. SBP mostly in the 130's so will start low dose Lisinopril. 2.  Pulmonary - On room air.  Continue  Xopenex, Mucinex. Encourage incentive spirometer. 3. Volume Overload - On Lasix 40 mg daily. Will not need at discharge 4.  Acute blood loss anemia - Last H and H stable at 8.8 and 27.7. On MVI 5. ID-WBC decreased to 12,000. On oral Levaquin. 6. GU-has suprapubic catheter in place  Levofloxacin for pulmonary etiology 7. Nutrition-appreciate assistance. On Ensure 8. Awaiting bed placement for SNF  Donielle M ZimmermanPA-C 09/12/2018,7:47 AM [phone]  patient examined and medical record reviewed,agree with above note. Kathlee Nations Trigt III 09/12/2018

## 2018-09-12 NOTE — Progress Notes (Signed)
Physical Therapy Treatment Patient Details Name: Joe Durham MRN: [id number] DOB: 20-Oct-1946 Today's Date: 09/12/2018    History of Present Illness Pt is a 72 y/o male admitted secondary to NSTEMI. Pt is s/p CABG X3. PMH includes CAD and HTN.     PT Comments    Patient is progressing very well towards their physical therapy goals. Ambulating hallway distances with Rollator and min guard assist. HR maintaining ~110 bpm. Demonstrates improved activity tolerance and balance. Requires moderate cues for reinforcing sternal precautions. D/c plan remains appropriate secondary to decreased caregiver support and since patient lives alone.    Follow Up Recommendations  SNF;Supervision/Assistance - 24 hour     Equipment Recommendations  None recommended by PT    Recommendations for Other Services       Precautions / Restrictions Precautions Precautions: Sternal;Fall Precaution Comments: needs cues to reinforce sternal precautions Restrictions Weight Bearing Restrictions: No Other Position/Activity Restrictions: sternal precautions    Mobility  Bed Mobility Overal bed mobility: Modified Independent                Transfers Overall transfer level: Needs assistance Equipment used: 4-wheeled walker Transfers: Sit to/from Stand Sit to Stand: Min guard         General transfer comment: cues for hands on knees  Ambulation/Gait Ambulation/Gait assistance: Min guard Gait Distance (Feet): 400 Feet Assistive device: 4-wheeled walker Gait Pattern/deviations: Step-through pattern;Trunk flexed Gait velocity: decreased Gait velocity interpretation: <1.8 ft/sec, indicate of risk for recurrent falls General Gait Details: Cues for hip extension, upright posture, and keeping feet shoulder width apart with turning   Stairs             Wheelchair Mobility    Modified Rankin (Stroke Patients Only)       Balance Overall balance assessment: Needs assistance   Sitting  balance-Leahy Scale: Good       Standing balance-Leahy Scale: Fair                              Cognition Arousal/Alertness: Awake/alert Behavior During Therapy: WFL for tasks assessed/performed Overall Cognitive Status: Within Functional Limits for tasks assessed                                        Exercises      General Comments        Pertinent Vitals/Pain Pain Assessment: No/denies pain    Home Living                      Prior Function            PT Goals (current goals can now be found in the care plan section) Acute Rehab PT Goals Patient Stated Goal: to go home Potential to Achieve Goals: Good Progress towards PT goals: Progressing toward goals    Frequency    Min 2X/week      PT Plan Current plan remains appropriate    Co-evaluation              AM-PAC PT "6 Clicks" Daily Activity  Outcome Measure  Difficulty turning over in bed (including adjusting bedclothes, sheets and blankets)?: None Difficulty moving from lying on back to sitting on the side of the bed? : None Difficulty sitting down on and standing up from a chair with arms (e.g., wheelchair, bedside commode,  etc,.)?: Unable Help needed moving to and from a bed to chair (including a wheelchair)?: A Little Help needed walking in hospital room?: A Little Help needed climbing 3-5 steps with a railing? : A Little 6 Click Score: 18    End of Session   Activity Tolerance: Patient tolerated treatment well Patient left: in bed;with call bell/phone within reach Nurse Communication: Mobility status PT Visit Diagnosis: Unsteadiness on feet (R26.81);Muscle weakness (generalized) (M62.81)     Time: 2774-1287 PT Time Calculation (min) (ACUTE ONLY): 20 min  Charges:  $Gait Training: 8-22 mins                     Laurina Bustle, [place], DPT Acute Rehabilitation Services Pager [phone] Office [phone]    Vanetta Mulders 09/12/2018, 10:56  AM

## 2018-09-12 NOTE — Clinical Social Work Note (Signed)
CSW facilitated patient discharge including contacting patient family and facility to confirm patient discharge plans. Clinical information faxed to facility and family agreeable with plan. CSW arranged ambulance transport via [facility] to [facility]. RN to call report prior to discharge [phone] Room [address] [place] [address]).  CSW will sign off for now as social work intervention is no longer needed. Please consult [place] again if new needs arise.  Charlynn Court, CSW [phone]

## 2018-09-12 NOTE — Progress Notes (Signed)
1000-1025 Offered to walk with pt but declined. Pt's mother's funeral today. Emotional support given. Helped pt call niece after ed done. Gave pt modified walking instructions, encouraged IS and smoking cessation, and encouraged him to eat but to not add salt. Pt does not think he will quit smoking. Discussed CRP 2. Referred to [place]. Pt does not want early classes. Encouraged pt to look over materials when feeling better. Gave pt smoking cessation handout. Luetta Nutting RN BSN 09/12/2018 10:24 AM '

## 2018-09-12 NOTE — Progress Notes (Signed)
Progress Note  Patient Name: Joe Durham Date of Encounter: 09/12/2018  Primary Cardiologist: Thurmon Fair, MD   Subjective   Feels ok.  Worried about his rent, which was due a few days ago.  Inpatient Medications    Scheduled Meds: . sodium chloride   Intravenous Once  . aspirin EC  325 mg Oral Daily  . atorvastatin  80 mg Oral q1800  . bisacodyl  10 mg Oral Daily  . carvedilol  6.25 mg Oral BID WC  . Chlorhexidine Gluconate Cloth  6 each Topical Daily  . docusate sodium  200 mg Oral Daily  . enoxaparin (LOVENOX) injection  30 mg Subcutaneous QHS  . feeding supplement (ENSURE ENLIVE)  237 mL Oral TID BM  . ferrous fumarate-b12-vitamic C-folic acid  1 capsule Oral Daily  . furosemide  40 mg Oral Daily  . guaiFENesin  600 mg Oral BID  . levalbuterol  0.63 mg Nebulization BID  . levofloxacin  500 mg Oral Daily  . lisinopril  2.5 mg Oral Daily  . LORazepam  1 mg Oral QHS  . mouth rinse  15 mL Mouth Rinse BID  . multivitamin with minerals  1 tablet Oral Daily  . pantoprazole  40 mg Oral Daily  . sodium chloride flush  10-40 mL Intracatheter Q12H  . sodium chloride flush  10-40 mL Intracatheter Q12H  . sodium chloride flush  3 mL Intravenous Q12H  . thiamine  100 mg Oral Daily   Continuous Infusions: . sodium chloride    . lactated ringers Stopped (09/09/18 0900)   PRN Meds: levalbuterol, ondansetron (ZOFRAN) IV, oxyCODONE, sodium chloride flush, sodium chloride flush, sodium chloride flush, traMADol   Vital Signs    Vitals:   09/11/18 2330 09/12/18 0345 09/12/18 0500 09/12/18 0700  BP: 116/68 108/81  133/73  Pulse:    (!) 102  Resp: (!) 24 (!) 24  (!) 25  Temp: 98.2 F (36.8 C) 98.6 F (37 C)  98.6 F (37 C)  TempSrc: Oral   Oral  SpO2:      Weight:   51.7 kg   Height:        Intake/Output Summary (Last 24 hours) at 09/12/2018 0948 Last data filed at 09/12/2018 0651 Gross per 24 hour  Intake 240 ml  Output 350 ml  Net -110 ml   Filed Weights   09/09/18 0600 09/10/18 0422 09/12/18 0500  Weight: 53.1 kg 55.1 kg 51.7 kg    Telemetry    NSR - Personally Reviewed  ECG      Physical Exam   GEN: No acute distress.  Frail Neck: No JVD Cardiac: RRR, no murmurs, rubs, or gallops.  Respiratory: Clear to auscultation bilaterally. GI: Soft, nontender, non-distended  MS: No edema; No deformity. Neuro:  Nonfocal  Psych: Normal affect   Labs    Chemistry Recent Labs  Lab 09/10/18 0426 09/11/18 0457 09/12/18 0612  NA 135 134* 134*  K 3.6 4.1 4.0  CL 100 101 100  CO2 30 28 28   GLUCOSE 97 92 95  BUN 15 10 9   CREATININE 1.03 0.97 1.10  CALCIUM 9.1 9.3 9.4  GFRNONAA >60 >60 >60  GFRAA >60 >60 >60  ANIONGAP 5 5 6      Hematology Recent Labs  Lab 09/08/18 0426 09/09/18 0400 09/11/18 0457  WBC 14.5* 13.5* 12.0*  RBC 2.92* 2.54* 2.70*  HGB 9.5* 8.4* 8.8*  HCT 30.6* 26.0* 27.7*  MCV 104.8* 102.4* 102.6*  MCH 32.5 33.1 32.6  MCHC  31.0 32.3 31.8  RDW 15.4 15.2 15.3  PLT 237 295 409*    Cardiac EnzymesNo results for input(s): TROPONINI in the last 168 hours. No results for input(s): TROPIPOC in the last 168 hours.   BNPNo results for input(s): BNP, PROBNP in the last 168 hours.   DDimer No results for input(s): DDIMER in the last 168 hours.   Radiology    No results found.  Cardiac Studies     Patient Profile     72 y.o. male s/p CABG  Assessment & Plan    1) CAD: Doing well post op.  High dose atorva for hyperlipidemia.  COntinue Coreg and lisinopril for BP and secondary prevention.  BP well controlled.   Will arrange f/u in Cardiology.  Awaiting SNF placement.  At discharge, he will need a note for his landlord saying that he was in the hospital on the day that his rent was due in November.  I asked him to convey this to the landlord via phone or through a family member as well.    [facility] will sign off.   Meds as noted above.   For questions or updates, please contact [facility] Please consult [email or web address] for contact info under        Signed, Lance Muss, MD  09/12/2018, 9:48 AM

## 2018-09-12 NOTE — Progress Notes (Signed)
Discharge paperwork given to [facility]. IV team DC PICC line. VSS. Report attempted to [facility] facility. All belongings sent with patient. Suprapubic catheter redressed, site CDI. Chest tube sutures removed per order, Steri strips placed, CDI.

## 2018-09-12 NOTE — Clinical Social Work Placement (Signed)
   CLINICAL SOCIAL WORK PLACEMENT  NOTE  Date:  09/12/2018  Patient Details  Name: Joe Durham MRN: [id number] Date of Birth: 01/29/1946  Clinical Social Work is seeking post-discharge placement for this patient at the Skilled  Nursing Facility level of care (*CSW will initial, date and re-position this form in  chart as items are completed):      Patient/family provided with [facility] Clinical Social Work Department's list of facilities offering this level of care within the geographic area requested by the patient (or if unable, by the patient's family).      Patient/family informed of their freedom to choose among providers that offer the needed level of care, that participate in Medicare, Medicaid or managed care program needed by the patient, have an available bed and are willing to accept the patient.      Patient/family informed of [place] [facility]'s ownership interest in [facility] and [facility], as well as of the fact that they are under no obligation to receive care at these facilities.  PASRR submitted to EDS on 09/11/18     PASRR number received on 09/11/18     Existing PASRR number confirmed on       FL2 transmitted to all facilities in geographic area requested by pt/family on 09/11/18     FL2 transmitted to all facilities within larger geographic area on       Patient informed that his/her managed care company has contracts with or will negotiate with certain facilities, including the following:        Yes   Patient/family informed of bed offers received.  Patient chooses bed at [facility]     Physician recommends and patient chooses bed at      Patient to be transferred to [facility] on 09/12/18.  Patient to be transferred to facility by [facility]     Patient family notified on 09/12/18 of transfer.  Name of family member notified:  Patient declined. Family is at his mother's funeral.     PHYSICIAN       Additional Comment:     _______________________________________________ Margarito Liner, LCSW 09/12/2018, 2:15 PM

## 2018-09-12 NOTE — Clinical Social Work Note (Signed)
Insurance authorization still pending. Discharge summary sent to SNF.  Charlynn Court, CSW [phone]

## 2018-09-15 ENCOUNTER — Telehealth (HOSPITAL_COMMUNITY): Payer: Self-pay

## 2018-09-15 NOTE — Telephone Encounter (Signed)
Pt insurance is active and benefits verified through [facility] Medicare. Co-pay $0.00, DED $0.00/$0.00 met, out of pocket $6,700.00/$10.95 met, co-insurance 0%. No pre-authorization. Passport, 09/15/18 @ 11:45AM, REF# [id number]  Will contact patient to see if he is interested in the Cardiac Rehab Program. If interested, patient will need to complete follow up appt. Once completed, patient will be contacted for scheduling upon review by the RN Navigator.

## 2018-09-25 ENCOUNTER — Ambulatory Visit (INDEPENDENT_AMBULATORY_CARE_PROVIDER_SITE_OTHER): Payer: Medicare Other | Admitting: Physician Assistant

## 2018-09-25 ENCOUNTER — Encounter: Payer: Self-pay | Admitting: Physician Assistant

## 2018-09-25 VITALS — BP 122/70 | HR 54 | Ht 68.0 in | Wt 125.8 lb

## 2018-09-25 DIAGNOSIS — I441 Atrioventricular block, second degree: Secondary | ICD-10-CM

## 2018-09-25 DIAGNOSIS — R05 Cough: Secondary | ICD-10-CM

## 2018-09-25 DIAGNOSIS — I25810 Atherosclerosis of coronary artery bypass graft(s) without angina pectoris: Secondary | ICD-10-CM

## 2018-09-25 DIAGNOSIS — I739 Peripheral vascular disease, unspecified: Secondary | ICD-10-CM

## 2018-09-25 DIAGNOSIS — I10 Essential (primary) hypertension: Secondary | ICD-10-CM

## 2018-09-25 DIAGNOSIS — E782 Mixed hyperlipidemia: Secondary | ICD-10-CM | POA: Diagnosis not present

## 2018-09-25 DIAGNOSIS — Z8673 Personal history of transient ischemic attack (TIA), and cerebral infarction without residual deficits: Secondary | ICD-10-CM | POA: Diagnosis not present

## 2018-09-25 DIAGNOSIS — R059 Cough, unspecified: Secondary | ICD-10-CM

## 2018-09-25 DIAGNOSIS — D649 Anemia, unspecified: Secondary | ICD-10-CM | POA: Diagnosis not present

## 2018-09-25 DIAGNOSIS — E785 Hyperlipidemia, unspecified: Secondary | ICD-10-CM

## 2018-09-25 NOTE — Patient Instructions (Addendum)
Medication Instructions:  Your physician recommends that you continue on your current medications as directed. Please refer to the Current Medication list given to you today.  If you need a refill on your cardiac medications before your next appointment, please call your pharmacy.   Lab work: Your physician recommends that you return for lab work in: [facility] AND 2 MONTHS FASTING LIPID AND LFT If you have labs (blood work) drawn today and your tests are completely normal, you will receive your results only by: Marland Kitchen MyChart Message (if you have MyChart) OR . A paper copy in the mail If you have any lab test that is abnormal or we need to change your treatment, we will call you to review the results.  Testing/Procedures: A chest x-ray takes a picture of the organs and structures inside the chest, including the heart, lungs, and blood vessels. This test can show several things, including, whether the heart is enlarges; whether fluid is building up in the lungs; and whether pacemaker / defibrillator leads are still in place. [facility] [phone]  Follow-Up: At [facility], you and your health needs are our priority.  As part of our continuing mission to provide you with exceptional heart care, we have created designated Provider Care Teams.  These Care Teams include your primary Cardiologist (physician) and Advanced Practice Providers (APPs -  Physician Assistants and Nurse Practitioners) who all work together to provide you with the care you need, when you need it. . Your physician recommends that you schedule a follow-up appointment in: 2-3 MONTHS WITH DR Royann Shivers  Any Other Special Instructions Will Be Listed Below (If Applicable). DO NOT FORGET TO GO YOUR FOLLOW UP APPT WITH DR  VAN TRIGT ON Wednesday October 08, 2018 at 3:00pm.

## 2018-09-25 NOTE — Progress Notes (Signed)
Cardiology Office Note    Date:  09/27/2018   ID:  Joe Durham, DOB Mar 20, 1946, MRN [id number]  PCP:  Patient, No Pcp Per  Cardiologist: Dr. Royann Shivers  Chief Complaint  Patient presents with  . Follow-up    seen for Dr. Royann Shivers    History of Present Illness:  Joe Durham is a 72 y.o. 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  Patient continued to have residual left lower extremity weakness.  He also has frequent episodes of irregular rhythm secondary to Mobitz type I second-degree AV block.  He was last seen by Dr. Royann Shivers on 01/23/2018.  He recently presented to the hospital on 09/03/2018 with chest pain.  EKG showed a sinus rhythm with frequent PACs, T wave inversion in the lateral leads.  Troponin was positive.  Cardiac catheterization performed on 09/03/2018 showed severe three-vessel disease with 99% ostial left circumflex lesion, 80% mid left circumflex lesion, 90% distal left main lesion, 99% ostial D1, 80% proximal RCA lesion.  The coronary arteries are very tortuous as well.  CT surgery was consulted for consideration of CABG.  TTE obtained on the same day showed EF 60 to 65%, mild to moderately reduced RV EF, PA peak pressure 32 mmHg.  Patient eventually underwent CABG x3 with LIMA to LAD, SVG to OM, SVG to RCA by Dr. Kathlee Nations Trigt.  Initially he was paced after the surgery, he was also on milrinone for several days postop.  This was stopped on 09/10/2018.  He had normal postop anemia and volume overload and require diuresis.  He did not require postop transfusion.  Due to concern of productive cough, he was placed on oral Levaquin.  He received 5 days of oral antibiotic.  Patient presents today for follow-up.  He is still have some soreness in his chest however has been doing well at the  [facility].  He is eating very well however he is sleeping very poorly.  He only sleeps roughly 2 hours at night.  He is complained that he is not getting adequate of salt intake.  I did remind him that he need to limit the amount of salt intake.  I also reminded him that he will need to be on high-dose aspirin for at least 3 months after the surgery and Dr. Royann Shivers likely will reduce it to low-dose aspirin sometime in the future.  Otherwise I plan to obtain a CBC to follow-up on his postop anemia.  He will also need a chest x-ray as well.  He continued to have productive cough even after the recent course of Levaquin for 5 days.  He will need to return in 2 months for fasting lipid panel and LFT.  His previous LDL was borderline high.  He can see Dr. Royann Shivers in 2 to 43-month.     Past Medical History:  Diagnosis Date  . Bladder calculi   . COPD (chronic obstructive pulmonary disease) (HCC)   . DOE (dyspnea on exertion)   . Foley catheter in place   . History of bladder stone   . 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 History of urinary retention   . Hypertension   . Mobitz type 1 second degree atrioventricular block   .  Osteoarthritis   . Paroxysmal atrial tachycardia (HCC)   . Peripheral vascular disease (HCC)    last duplex 09-25-2016 bilateral ABI 0.6 and occulsion of  right popiteal posterior tibials and left peroneal  . Wears glasses     Past Surgical History:  Procedure Laterality Date  . CORONARY ARTERY BYPASS GRAFT N/A 09/05/2018   Procedure: CORONARY ARTERY BYPASS GRAFTING (CABG) times 3 using left internal mammary artery and right greater saphenous vein harvested endoscopically.;  Surgeon: Kerin Perna, MD;  [facility];  Service: Open Heart Surgery;  Laterality: N/A;  . CYSTOSCOPY WITH LITHOLAPAXY N/A 07/15/2017   Procedure: OPEN  CYSTOLITHOTOMY;  Surgeon: Crista Elliot, MD;  Location: [facility] ORS;  Service: Urology;  Laterality: N/A;  . INGUINAL HERNIA REPAIR Right 2011  . INSERTION OF SUPRAPUBIC CATHETER N/A 07/15/2017   Procedure: INSERTION OF SUPRAPUBIC CATHETER;  Surgeon: Crista Elliot, MD;  Location: [facility] ORS;  Service: Urology;  Laterality: N/A;  . LEFT HEART CATH AND CORONARY ANGIOGRAPHY N/A 09/03/2018   Procedure: LEFT HEART CATH AND CORONARY ANGIOGRAPHY;  Surgeon: Iran Ouch, MD;  Location: [facility] INVASIVE CV LAB;  Service: Cardiovascular;  Laterality: N/A;  . LOOP RECORDER IMPLANT N/A 01/05/2015   Procedure: LOOP RECORDER IMPLANT;  Surgeon: Thurmon Fair, MD;  Location: [facility] CATH LAB;  Service: Cardiovascular;  Laterality: N/A;  . ORIF WRIST FRACTURE Left 03/20/2014   Procedure: OPEN REDUCTION INTERNAL FIXATION (ORIF) WRIST FRACTURE;  Surgeon: Dominica Severin, MD;  Location: [facility] OR;  Service: Orthopedics;  Laterality: Left;  . TEE WITHOUT CARDIOVERSION N/A 01/05/2015   Procedure: TRANSESOPHAGEAL ECHOCARDIOGRAM (TEE)/LOOP;  Surgeon: Thurmon Fair, MD;  Location: [facility] ENDOSCOPY;  Service: Cardiovascular;  Laterality: N/A; ef 60-65%, no LA or RA thrombus, trivial PR, mild TR  . TEE WITHOUT CARDIOVERSION N/A 09/05/2018   Procedure: TRANSESOPHAGEAL ECHOCARDIOGRAM (TEE);  Surgeon: Donata Clay, Theron Arista, MD;  Location: [facility] OR;  Service: Open Heart Surgery;  Laterality: N/A;  . TONSILLECTOMY  child  . TOTAL HIP ARTHROPLASTY Right 2000    Current Medications: Outpatient Medications Prior to Visit  Medication Sig Dispense Refill  . aspirin EC 325 MG EC tablet Take 1 tablet (325 mg total) by mouth daily. 30 tablet 0  . atorvastatin (LIPITOR) 80 MG tablet Take 1 tablet (80 mg total) by mouth daily at 6 PM. 30 tablet 1  . carvedilol (COREG) 6.25 MG tablet Take 1 tablet (6.25 mg total) by mouth 2 (two) times daily with a meal. 60 tablet 1  . ferrous sulfate 325 (65 FE) MG tablet Take 1 tablet (325 mg total) by mouth daily with  breakfast. For one month then stop. 30 tablet 3  . guaiFENesin (MUCINEX) 600 MG 12 hr tablet Take 1 tablet (600 mg total) by mouth 2 (two) times daily as needed for cough or to loosen phlegm.    Marland Kitchen levofloxacin (LEVAQUIN) 500 MG tablet Take 1 tablet (500 mg total) by mouth daily. For one day then stop. 1 tablet 0  . lisinopril (PRINIVIL,ZESTRIL) 2.5 MG tablet Take 1 tablet (2.5 mg total) by mouth daily. 30 tablet 1  . Multiple Vitamin (MULTIVITAMIN WITH MINERALS) TABS tablet Take 1 tablet by mouth daily.    . traMADol (ULTRAM) 50 MG tablet Take 1 tablet (50 mg total) by mouth every 4 (four) hours as needed for moderate pain. 24 tablet 0   No facility-administered medications prior to visit.      Allergies:   Lactose intolerance (gi)   Social History  Socioeconomic History  . Marital status: Single    Spouse name: Not on file  . Number of children: 4  . Years of education: 9  . Highest education level: Not on file  Occupational History  . Occupation: retired    Comment: Training and development officer  . Financial resource strain: Not on file  . Food insecurity:    Worry: Not on file    Inability: Not on file  . Transportation needs:    Medical: Not on file    Non-medical: Not on file  Tobacco Use  . Smoking status: Current Every Day Smoker    Years: 52.00    Types: Cigarettes  . Smokeless tobacco: Never Used  . Tobacco comment: 07-08-2018 pt is down to 1ppw from 1 ppd  Substance and Sexual Activity  . Alcohol use: Yes    Alcohol/week: 0.0 standard drinks    Comment: 08/2018 ! drink 1 pint a day 3 times a week on average  . Drug use: No    Comment: history of cocaine   . Sexual activity: Not on file  Lifestyle  . Physical activity:    Days per week: Not on file    Minutes per session: Not on file  . Stress: Not on file  Relationships  . Social connections:    Talks on phone: Not on file    Gets together: Not on file    Attends religious service: Not on file    Active  member of club or organization: Not on file    Attends meetings of clubs or organizations: Not on file    Relationship status: Not on file  Other Topics Concern  . Not on file  Social History Narrative   Single   Right handed   Caffeine use - sodas on weekends with alcohol     Family History:  The patient's family history includes Stroke in his mother and sister.   ROS:   Please see the history of present illness.    ROS All other systems reviewed and are negative.   PHYSICAL EXAM:   VS:  BP 122/70   Pulse (!) 54   Ht 5\' 8"  (1.727 m)   Wt 125 lb 12.8 oz (57.1 kg)   BMI 19.13 kg/m    GEN: Well nourished, well developed, in no acute distress  HEENT: normal  Neck: no JVD, carotid bruits, or masses Cardiac: RRR; no murmurs, rubs, or gallops,no edema  Respiratory:  clear to auscultation bilaterally, normal work of breathing GI: soft, nontender, nondistended, + BS MS: no deformity or atrophy  Skin: warm and dry, no rash Neuro:  Alert and Oriented x 3, Strength and sensation are intact Psych: euthymic mood, full affect  Wt Readings from Last 3 Encounters:  09/25/18 125 lb 12.8 oz (57.1 kg)  09/12/18 113 lb 15.7 oz (51.7 kg)  07/08/18 117 lb 2 oz (53.1 kg)      Studies/Labs Reviewed:   EKG:  EKG is ordered today.  The ekg ordered today demonstrates sinus with wenkebach  Recent Labs: 09/04/2018: ALT 14; TSH 1.427 09/06/2018: Magnesium 2.5 09/12/2018: BUN 9; Creatinine, Ser 1.10; Potassium 4.0; Sodium 134 09/25/2018: Hemoglobin 8.6; Platelets 505   Lipid Panel    Component Value Date/Time   CHOL 173 03/27/2018 1306   TRIG 49 03/27/2018 1306   HDL 76 03/27/2018 1306   CHOLHDL 2.3 03/27/2018 1306   CHOLHDL 3.9 01/04/2015 0855   VLDL 16 01/04/2015 0855   LDLCALC 87 03/27/2018 1306  Additional studies/ records that were reviewed today include:   Cath 09/03/2018  Ost Cx to Prox Cx lesion is 99% stenosed.  Mid Cx to Dist Cx lesion is 80% stenosed.  Dist LM to  Prox LAD lesion is 90% stenosed.  Ost 1st Diag lesion is 99% stenosed.  Prox RCA to Mid RCA lesion is 80% stenosed.  Dist RCA lesion is 60% stenosed.   1.  Severe heavily calcified three-vessel coronary artery disease.  The coronary arteries are very tortuous. 2.  Left ventricular angiography was not performed.  EF was normal by echo. 3.  Mildly elevated left ventricular end-diastolic pressure at 17 mmHg.  Recommendations: Recommend surgical consult for CABG.  PCI options are limited by severe calcifications and tortuosity that make atherectomy high risk for dissection and perforation. Resume heparin 8 hours after sheath pull.   Echo 09/03/2018 LV EF: 60% -   65%  ------------------------------------------------------------------- Indications:      Chest pain 786.51.  ------------------------------------------------------------------- History:   PMH:  Atrial Tachycardia.  Atrial fibrillation.  Stroke.  Chronic obstructive pulmonary disease.  Risk factors: Hypertension.  ------------------------------------------------------------------- Study Conclusions  - Left ventricle: The cavity size was normal. Wall thickness was   increased in a pattern of mild LVH. There was focal basal   hypertrophy. Systolic function was normal. The estimated ejection   fraction was in the range of 60% to 65%. Wall motion was normal;   there were no regional wall motion abnormalities. The study is   not technically sufficient to allow evaluation of LV diastolic   function. - Mitral valve: There was mild regurgitation. - Right ventricle: Systolic function was mildly to moderately   reduced. - Pulmonary arteries: Systolic pressure was mildly increased. PA   peak pressure: 32 mm Hg (S).    ASSESSMENT:    1. Coronary artery disease involving coronary bypass graft of native heart without angina pectoris   2. Anemia, unspecified type   3. Mixed hyperlipidemia   4. H/O: CVA (cerebrovascular  accident)   5. PAD (peripheral artery disease) (HCC)   6. Essential hypertension   7. Hyperlipidemia, unspecified hyperlipidemia type   8. Wenckebach block      PLAN:  In order of problems listed above:  1. CAD s/p CABG: Sternal scar is well-healed.  No obvious complication after the recent surgery.  We will continue on high-dose aspirin for now  2. Postop anemia: Follow-up with CBC  3. Hypertension: Blood pressure well controlled on current medication  4. Hyperlipidemia: Continue high-dose statin.  Fasting lipid panel and LFT in 2 months.  LDL goal less than 70  5. History of CVA: No recent recurrence.  6. 2nd degree AV type 1 Wenckebach block: Evident on today's EKG.  Will not uptitrate carvedilol any further.  7. PAD: No significant claudication symptoms.  He has a long-standing history of lower extremity arterial disease.    Medication Adjustments/Labs and Tests Ordered: Current medicines are reviewed at length with the patient today.  Concerns regarding medicines are outlined above.  Medication changes, Labs and Tests ordered today are listed in the Patient Instructions below. Patient Instructions  Medication Instructions:  Your physician recommends that you continue on your current medications as directed. Please refer to the Current Medication list given to you today.  If you need a refill on your cardiac medications before your next appointment, please call your pharmacy.   Lab work: Your physician recommends that you return for lab work in: [facility] AND 2 MONTHS FASTING LIPID  AND LFT If you have labs (blood work) drawn today and your tests are completely normal, you will receive your results only by: Marland Kitchen MyChart Message (if you have MyChart) OR . A paper copy in the mail If you have any lab test that is abnormal or we need to change your treatment, we will call you to review the results.  Testing/Procedures: A chest x-ray takes a picture of the organs and  structures inside the chest, including the heart, lungs, and blood vessels. This test can show several things, including, whether the heart is enlarges; whether fluid is building up in the lungs; and whether pacemaker / defibrillator leads are still in place. [facility] [phone]  Follow-Up: At [facility], you and your health needs are our priority.  As part of our continuing mission to provide you with exceptional heart care, we have created designated Provider Care Teams.  These Care Teams include your primary Cardiologist (physician) and Advanced Practice Providers (APPs -  Physician Assistants and Nurse Practitioners) who all work together to provide you with the care you need, when you need it. . Your physician recommends that you schedule a follow-up appointment in: 2-3 MONTHS WITH DR Royann Shivers  Any Other Special Instructions Will Be Listed Below (If Applicable). DO NOT FORGET TO GO YOUR FOLLOW UP APPT WITH DR  Zenaida Niece TRIGT ON Wednesday October 08, 2018 at 3:00pm.     Ramond Dial, [place]  09/27/2018 11:12 PM    [facility] [address] [place], [place], [place]  [address] Phone: [phone]; Fax: [phone]

## 2018-09-26 LAB — CBC
HEMATOCRIT: 26.2 % — AB (ref 37.5–51.0)
HEMOGLOBIN: 8.6 g/dL — AB (ref 13.0–17.7)
MCH: 32.1 pg (ref 26.6–33.0)
MCHC: 32.8 g/dL (ref 31.5–35.7)
MCV: 98 fL — AB (ref 79–97)
PLATELETS: 505 10*3/uL — AB (ref 150–450)
RBC: 2.68 x10E6/uL — AB (ref 4.14–5.80)
RDW: 13.5 % (ref 12.3–15.4)
WBC: 7.8 10*3/uL (ref 3.4–10.8)

## 2018-09-27 ENCOUNTER — Encounter: Payer: Self-pay | Admitting: Physician Assistant

## 2018-09-29 ENCOUNTER — Other Ambulatory Visit: Payer: Self-pay

## 2018-09-29 DIAGNOSIS — N289 Disorder of kidney and ureter, unspecified: Secondary | ICD-10-CM

## 2018-09-29 DIAGNOSIS — I2510 Atherosclerotic heart disease of native coronary artery without angina pectoris: Secondary | ICD-10-CM

## 2018-09-29 NOTE — Progress Notes (Signed)
Thank you MCr 

## 2018-09-29 NOTE — Addendum Note (Signed)
Addended by: Kandice Robinsons T on: 09/29/2018 01:23 PM   Modules accepted: Orders

## 2018-10-07 ENCOUNTER — Other Ambulatory Visit: Payer: Self-pay | Admitting: Cardiothoracic Surgery

## 2018-10-07 DIAGNOSIS — I25119 Atherosclerotic heart disease of native coronary artery with unspecified angina pectoris: Secondary | ICD-10-CM

## 2018-10-07 DIAGNOSIS — Z951 Presence of aortocoronary bypass graft: Secondary | ICD-10-CM

## 2018-10-08 ENCOUNTER — Ambulatory Visit: Payer: Self-pay | Admitting: Cardiothoracic Surgery

## 2018-10-14 ENCOUNTER — Ambulatory Visit: Payer: Self-pay

## 2018-10-21 ENCOUNTER — Other Ambulatory Visit: Payer: Self-pay | Admitting: Cardiothoracic Surgery

## 2018-10-21 ENCOUNTER — Ambulatory Visit: Payer: Self-pay

## 2018-10-21 DIAGNOSIS — Z951 Presence of aortocoronary bypass graft: Secondary | ICD-10-CM

## 2018-10-22 ENCOUNTER — Encounter: Payer: Self-pay | Admitting: Cardiothoracic Surgery

## 2018-10-23 ENCOUNTER — Other Ambulatory Visit: Payer: Self-pay

## 2018-10-23 ENCOUNTER — Ambulatory Visit
Admission: RE | Admit: 2018-10-23 | Discharge: 2018-10-23 | Disposition: A | Discharge status: Home / Self Care | Payer: Medicare Other | Source: Ambulatory Visit | Attending: Cardiothoracic Surgery | Admitting: Cardiothoracic Surgery

## 2018-10-23 ENCOUNTER — Ambulatory Visit (INDEPENDENT_AMBULATORY_CARE_PROVIDER_SITE_OTHER): Payer: Self-pay | Admitting: Surgical

## 2018-10-23 VITALS — BP 140/90 | HR 56 | Resp 20 | Ht 68.0 in | Wt 118.2 lb

## 2018-10-23 DIAGNOSIS — Z951 Presence of aortocoronary bypass graft: Secondary | ICD-10-CM

## 2018-10-23 MED ORDER — ATORVASTATIN CALCIUM 80 MG PO TABS: 80.0000 mg | ORAL_TABLET | Freq: Every day | ORAL | 1 refills | Status: DC

## 2018-10-23 MED ORDER — LISINOPRIL 2.5 MG PO TABS: 2.5000 mg | ORAL_TABLET | Freq: Every day | ORAL | 1 refills | Status: DC

## 2018-10-23 MED ORDER — CARVEDILOL 6.25 MG PO TABS: 6.2500 mg | ORAL_TABLET | Freq: Two times a day (BID) | ORAL | 1 refills | Status: DC

## 2018-10-23 NOTE — Patient Instructions (Signed)
Lifting restrictions reviewed with the patient.  He does not drive a car so he will not require limitations for this.  He is instructed not to lift anything over 20 pounds for the next 2 months.

## 2018-10-23 NOTE — Progress Notes (Signed)
[address]       [name] [address]             [phone]      Joe Durham [facility] Medical Record #[id number] Date of Birth: 1946/04/09  Referring: Thurmon Fair, MD Primary Care: Patient, No Pcp Per Primary Cardiologist: Thurmon Fair, MD   Chief Complaint:   POST OP FOLLOW UP OPERATIVE REPORT  DATE OF PROCEDURE:  09/05/2018  OPERATION: 1.  Coronary artery bypass grafting x3 (left internal mammary artery to left anterior descending, saphenous vein graft to obtuse marginal 1, saphenous vein graft to posterior descending). 2.  Endoscopic harvest of right leg greater saphenous vein.  SURGEON:  Mikey Bussing, MD  ASSISTANT:  Doree Fudge, PA-C and Tanda Rockers, RNFA  History of Present Illness:    She is a 72 year old male status post the above described procedure seen in the office on today's date and postsurgical follow-up.  He denies any current cardiac issues but does have occasional sternal discomfort from the incision.  He is not having any anginal equivalents or shortness of breath.  He does continue to smoke cigarettes.  He is not having lower extremity edema.  He denies fevers, chills or other significant constitutional symptoms.  He does report that he "lost his prescriptions" so he has not been taking any of his medications that are prescribed.  He has been taking aspirin for "pain".  It is unclear his social situation.  He is on disability.  He does appear to be somewhat disheveled and smells strongly of cigarettes.      Past Medical History:  Diagnosis Date  . Bladder calculi   . COPD (chronic obstructive pulmonary disease) (HCC)   . DOE (dyspnea on exertion)   . Foley catheter in place   . History of bladder stone   . 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 History of urinary retention   .  Hypertension   . Mobitz type 1 second degree atrioventricular block   . Osteoarthritis   . Paroxysmal atrial tachycardia (HCC)   . Peripheral vascular disease (HCC)    last duplex 09-25-2016 bilateral ABI 0.6 and occulsion of  right popiteal posterior tibials and left peroneal  . Wears glasses      Social History   Tobacco Use  Smoking Status Current Every Day Smoker  . Years: 52.00  . Types: Cigarettes  Smokeless Tobacco Never Used  Tobacco Comment   07-08-2018 pt is down to 1ppw from 1 ppd    Social History   Substance and Sexual Activity  Alcohol Use Yes  . Alcohol/week: 0.0 standard drinks   Comment: 08/2018 ! drink 1 pint a day 3 times a week on average     Allergies  Allergen Reactions  . Lactose Intolerance (Gi) Diarrhea    Current Outpatient Medications  Medication Sig Dispense Refill  . aspirin EC 325 MG EC tablet Take 1 tablet (325 mg total) by mouth daily. 30 tablet 0  . atorvastatin (LIPITOR) 80 MG tablet Take 1 tablet (80 mg total) by mouth daily. 30 tablet 1  . carvedilol (COREG) 6.25 MG tablet Take 1 tablet (6.25 mg total) by mouth 2 (two) times daily. 60 tablet 1  . ferrous sulfate 325 (65 FE) MG tablet Take 1 tablet (325 mg total) by mouth daily with breakfast. For one  month then stop. (Patient not taking: Reported on 10/23/2018) 30 tablet 3  . guaiFENesin (MUCINEX) 600 MG 12 hr tablet Take 1 tablet (600 mg total) by mouth 2 (two) times daily as needed for cough or to loosen phlegm. (Patient not taking: Reported on 10/23/2018)    . levofloxacin (LEVAQUIN) 500 MG tablet Take 1 tablet (500 mg total) by mouth daily. For one day then stop. (Patient not taking: Reported on 10/23/2018) 1 tablet 0  . lisinopril (ZESTRIL) 2.5 MG tablet Take 1 tablet (2.5 mg total) by mouth daily. 30 tablet 1  . Multiple Vitamin (MULTIVITAMIN WITH MINERALS) TABS tablet Take 1 tablet by mouth daily. (Patient not taking: Reported on 10/23/2018)    . traMADol (ULTRAM) 50 MG tablet  Take 1 tablet (50 mg total) by mouth every 4 (four) hours as needed for moderate pain. (Patient not taking: Reported on 10/23/2018) 24 tablet 0   No current facility-administered medications for this visit.        Physical Exam: BP 140/90 (BP Location: Right Arm, Patient Position: Sitting, Cuff Size: Normal)   Pulse (!) 56   Resp 20   Ht 5\' 8"  (1.727 m)   Wt 53.6 kg   SpO2 97% Comment: RA  BMI 17.97 kg/m   General appearance: alert, cooperative and no distress Heart: regular rate and rhythm Lungs: clear to auscultation bilaterally Abdomen: Thin, nontender, nondistended Extremities: No edema Wound: Incisions well-healed without evidence of infection.   Diagnostic Studies & Laboratory data:     Recent Radiology Findings:   Dg Chest 2 View  Result Date: 10/23/2018 CLINICAL DATA:  Post CABG EXAM: CHEST - 2 VIEW COMPARISON:  Chest x-ray of 09/10/2017 FINDINGS: The lungs remain very hyperaerated consistent with emphysema. The effusions have improved with only tiny effusions remaining. The basilar atelectasis has resolved. Mediastinal and hilar contours are unremarkable and mild cardiomegaly is stable. Median sternotomy sutures are present. Loop recorder overlies the left chest. No acute bony abnormality is seen. IMPRESSION: 1. Improved aeration with decrease in small pleural effusions and mild basilar atelectasis. 2. Stable hyperaeration consistent with emphysema. 3. Loop recorder overlies the left chest. Electronically Signed   By: Dwyane DeePaul  Barry M.D.   On: 10/23/2018 15:51      Recent Lab Findings: Lab Results  Component Value Date   WBC 7.8 09/25/2018   HGB 8.6 (L) 09/25/2018   HCT 26.2 (L) 09/25/2018   PLT 505 (H) 09/25/2018   GLUCOSE 95 09/12/2018   CHOL 173 03/27/2018   TRIG 49 03/27/2018   HDL 76 03/27/2018   LDLCALC 87 03/27/2018   ALT 14 09/04/2018   AST 25 09/04/2018   NA 134 (L) 09/12/2018   K 4.0 09/12/2018   CL 100 09/12/2018   CREATININE 1.10 09/12/2018    BUN 9 09/12/2018   CO2 28 09/12/2018   TSH 1.427 09/04/2018   INR 1.46 09/05/2018   HGBA1C 4.7 (L) 01/04/2015      Assessment / Plan: The patient is overall doing well.  He has been seen by cardiology.  I did renew his prescriptions for Lipitor, Coreg and Zestril.  His only medications are over-the-counter.  His chest x-ray was reviewed and is as described above.  He should continue to take a full-strength aspirin daily.  We will see him again in surgical clinic as needed or at request.          Rowe ClackWayne E Gold, PA-C 10/23/2018 4:19 PM

## 2018-11-11 ENCOUNTER — Telehealth (HOSPITAL_COMMUNITY): Payer: Self-pay

## 2018-11-11 NOTE — Telephone Encounter (Signed)
Called pt to see if he would like to proceed with scheduling for CR, pt stated not at this time. ° °Closed referral °

## 2018-12-04 ENCOUNTER — Other Ambulatory Visit: Payer: Self-pay | Admitting: Urology

## 2018-12-04 ENCOUNTER — Telehealth: Payer: Self-pay | Admitting: Cardiovascular Disease

## 2018-12-04 NOTE — Telephone Encounter (Signed)
     [facility] Pre-operative Risk Assessment    Request for surgical clearance:  1. What type of surgery is being performed? TURP  2. When is this surgery scheduled? 12/29/18   3. What type of clearance is required (medical clearance vs. Pharmacy clearance to hold med vs. Both)? Both  4. Are there any medications that need to be held prior to surgery and how long? Aspirin   5. Practice name and name of physician performing surgery? [facility], Dr Diona Foley  6. What is your office phone number [phone] ext [phone]   7.   What is your office fax number [phone]  8.   Anesthesia type (None, local, MAC, general) ? General   Lysbeth Galas S 12/04/2018, 11:31 AM  _________________________________________________________________   (provider comments below)

## 2018-12-10 NOTE — Telephone Encounter (Signed)
Dr. Royann Shivers  Pt to see you on 12/19/18  He will need pre-op clearance for TURP on that visit as well.  Thanks.

## 2018-12-10 NOTE — Telephone Encounter (Signed)
I am on vacation 2/14. How did he get put on my schedule that day? MCr

## 2018-12-10 NOTE — Telephone Encounter (Signed)
Pt to see Dr. Royann Shivers 12/19/18 for pre-op eval.  For TURP

## 2018-12-10 NOTE — Telephone Encounter (Signed)
He has surgery 2/24, can we get him in before?

## 2018-12-12 ENCOUNTER — Encounter: Payer: Self-pay | Admitting: *Deleted

## 2018-12-15 ENCOUNTER — Ambulatory Visit (INDEPENDENT_AMBULATORY_CARE_PROVIDER_SITE_OTHER): Payer: Medicare Other | Admitting: Cardiology

## 2018-12-15 ENCOUNTER — Encounter: Payer: Self-pay | Admitting: Cardiology

## 2018-12-15 VITALS — BP 140/86 | HR 66 | Ht 68.0 in | Wt 124.4 lb

## 2018-12-15 DIAGNOSIS — I10 Essential (primary) hypertension: Secondary | ICD-10-CM | POA: Diagnosis not present

## 2018-12-15 DIAGNOSIS — Z01818 Encounter for other preprocedural examination: Secondary | ICD-10-CM | POA: Insufficient documentation

## 2018-12-15 DIAGNOSIS — I739 Peripheral vascular disease, unspecified: Secondary | ICD-10-CM | POA: Diagnosis not present

## 2018-12-15 DIAGNOSIS — Z951 Presence of aortocoronary bypass graft: Secondary | ICD-10-CM

## 2018-12-15 DIAGNOSIS — I2510 Atherosclerotic heart disease of native coronary artery without angina pectoris: Secondary | ICD-10-CM

## 2018-12-15 DIAGNOSIS — E785 Hyperlipidemia, unspecified: Secondary | ICD-10-CM

## 2018-12-15 DIAGNOSIS — Z720 Tobacco use: Secondary | ICD-10-CM

## 2018-12-15 DIAGNOSIS — I214 Non-ST elevation (NSTEMI) myocardial infarction: Secondary | ICD-10-CM

## 2018-12-15 MED ORDER — ATORVASTATIN CALCIUM 80 MG PO TABS: 80.0000 mg | ORAL_TABLET | Freq: Every day | ORAL | 1 refills | Status: DC

## 2018-12-15 MED ORDER — LISINOPRIL 2.5 MG PO TABS: 2.5000 mg | ORAL_TABLET | Freq: Every day | ORAL | 1 refills | Status: DC

## 2018-12-15 MED ORDER — CARVEDILOL 6.25 MG PO TABS: 6.2500 mg | ORAL_TABLET | Freq: Two times a day (BID) | ORAL | 3 refills | Status: DC

## 2018-12-15 MED ORDER — CARVEDILOL 6.25 MG PO TABS: 6.2500 mg | ORAL_TABLET | Freq: Two times a day (BID) | ORAL | 1 refills | Status: DC

## 2018-12-15 MED ORDER — LISINOPRIL 2.5 MG PO TABS: 2.5000 mg | ORAL_TABLET | Freq: Every day | ORAL | 3 refills | Status: AC

## 2018-12-15 MED ORDER — ATORVASTATIN CALCIUM 80 MG PO TABS: 80.0000 mg | ORAL_TABLET | Freq: Every day | ORAL | 3 refills | Status: AC

## 2018-12-15 MED ORDER — ASPIRIN EC 81 MG PO TBEC: 81.0000 mg | DELAYED_RELEASE_TABLET | Freq: Every day | ORAL | 3 refills | Status: DC

## 2018-12-15 NOTE — Assessment & Plan Note (Signed)
LIMA to LAD SVG to OM SVG to RCA 

## 2018-12-15 NOTE — Progress Notes (Signed)
12/15/2018 Joe Durham   Mar 31, 1946  [id number]  Primary Physician Patient, No Pcp Per Primary Cardiologist: Dr Royann Shivers  HPI: The patient is a pleasant 73 year old male with a history of PAT, prior right posterior cerebral artery embolism stroke in 2016, known peripheral vascular disease and lower extremities, hypertension, dyslipidemia, and smoking.  He is also had transient Mobitz 1 AV block.  He presented to [facility] October 2019 with unstable angina.  Catheterization was done and he subsequently underwent bypass surgery x3 with an LIMA to LAD, SVG to OM, SVG to RCA.  Ultimately he recovered well.  He was seen in the office in November for follow-up was doing well at that time.  He is seen now for preoperative clearance prior to TURP.  Since we last saw the patient in November he continues to do well.  He denies any chest pain or unusual dyspnea.  He has not had tachycardia or syncope or near syncope.  He did admit to lower extremity claudication, right greater than left.  He also tells me he ran out of "some of my medicines".  I think he is out of all of them to be honest.  He continues to smoke but "not much".   Current Outpatient Medications  Medication Sig Dispense Refill  . atorvastatin (LIPITOR) 80 MG tablet Take 1 tablet (80 mg total) by mouth daily. 90 tablet 3  . carvedilol (COREG) 6.25 MG tablet Take 1 tablet (6.25 mg total) by mouth 2 (two) times daily. 180 tablet 3  . lisinopril (ZESTRIL) 2.5 MG tablet Take 1 tablet (2.5 mg total) by mouth daily. 90 tablet 3  . aspirin EC 81 MG tablet Take 1 tablet (81 mg total) by mouth daily. 90 tablet 3   No current facility-administered medications for this visit.     Allergies  Allergen Reactions  . Lactose Intolerance (Gi) Diarrhea    Past Medical History:  Diagnosis Date  . Bladder calculi   . COPD (chronic obstructive pulmonary disease) (HCC)   . DOE (dyspnea on exertion)   . Foley catheter in place   . History of  bladder stone   . 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 History of urinary retention   . Hypertension   . Mobitz type 1 second degree atrioventricular block   . Osteoarthritis   . Paroxysmal atrial tachycardia (HCC)   . Peripheral vascular disease (HCC)    last duplex 09-25-2016 bilateral ABI 0.6 and occulsion of  right popiteal posterior tibials and left peroneal  . Wears glasses     Social History   Socioeconomic History  . Marital status: Single    Spouse name: Not on file  . Number of children: 4  . Years of education: 9  . Highest education level: Not on file  Occupational History  . Occupation: retired    Comment: Training and development officer  . Financial resource strain: Not on file  . Food insecurity:    Worry: Not on file    Inability: Not on file  . Transportation needs:    Medical: Not on file    Non-medical: Not on file  Tobacco Use  . Smoking status: Current Every Day Smoker    Years: 52.00    Types: Cigarettes  . Smokeless tobacco: Never Used  . Tobacco comment: 07-08-2018 pt is down to 1ppw from 1 ppd  Substance  and Sexual Activity  . Alcohol use: Yes    Alcohol/week: 0.0 standard drinks    Comment: 08/2018 ! drink 1 pint a day 3 times a week on average  . Drug use: No    Comment: history of cocaine   . Sexual activity: Not on file  Lifestyle  . Physical activity:    Days per week: Not on file    Minutes per session: Not on file  . Stress: Not on file  Relationships  . Social connections:    Talks on phone: Not on file    Gets together: Not on file    Attends religious service: Not on file    Active member of club or organization: Not on file    Attends meetings of clubs or organizations: Not on file    Relationship status: Not on file  . Intimate partner violence:    Fear of current or ex partner: Not on file    Emotionally abused:  Not on file    Physically abused: Not on file    Forced sexual activity: Not on file  Other Topics Concern  . Not on file  Social History Narrative   Single   Right handed   Caffeine use - sodas on weekends with alcohol     Family History  Problem Relation Age of Onset  . Stroke Mother   . Stroke Sister      Review of Systems: General: negative for chills, fever, night sweats or weight changes.  Cardiovascular: negative for chest pain, dyspnea on exertion, edema, orthopnea, palpitations, paroxysmal nocturnal dyspnea or shortness of breath Dermatological: negative for rash Respiratory: negative for cough or wheezing Urologic: negative for hematuria Abdominal: negative for nausea, vomiting, diarrhea, bright red blood per rectum, melena, or hematemesis Neurologic: negative for visual changes, syncope, or dizziness All other systems reviewed and are otherwise negative except as noted above.    Blood pressure 140/86, pulse 66, height 5\' 8"  (1.727 m), weight 124 lb 6.4 oz (56.4 kg).  General appearance: alert, cooperative, no distress and thin, poor dentition Lungs: clear to auscultation bilaterally Heart: regular rate and rhythm Extremities: no edema, no obvious wounds Pulses: decreased pulses in both LE Neurologic: Grossly normal  EKG NSR, 66, one PVC, LVH  ASSESSMENT AND PLAN:   Pre-operative clearance Seen today for pre op clearance prior to TURP  Claudication (HCC) Rt > Lt claudication  PAD (peripheral artery disease) (HCC) ABIs 0.6 in 2017  S/P CABG x 07 Sep 2018 LIMA to LAD SVG to OM SVG to RCA  Tobacco abuse 1/2 PPD  Dyslipidemia, goal LDL below 70 Resume statin Rx   PLAN  I'm not sure which medications he is out but I resumed them all through his pharmacy.  I also cut his ASA back to 81 mg daily.  He is cleared from a cardiology standpoint for TURP.  I will fax clearance via Epic to Dr Newman Pies.  I ordered LEA dopplers for March- arrange f/u with Dr Allyson Sabal  later in March for [facility] evaluation.   Corine Shelter PA-C 12/15/2018 5:03 PM

## 2018-12-15 NOTE — Assessment & Plan Note (Signed)
Seen today for pre op clearance prior to TURP

## 2018-12-15 NOTE — Assessment & Plan Note (Signed)
ABIs 0.6 in 2017

## 2018-12-15 NOTE — Assessment & Plan Note (Signed)
Resume statin Rx

## 2018-12-15 NOTE — Telephone Encounter (Signed)
   Primary Cardiologist: Thurmon Fair, MD  Chart reviewed and patient seen in the office today  as part of pre-operative protocol coverage. Given past medical history and time since last visit, based on ACC/AHA guidelines, Joe Durham would be at acceptable risk for the planned procedure without further cardiovascular testing.   OK to hold aspirin 3-5 days pre op if needed.   I will route this recommendation to the requesting party via Epic fax function and remove from pre-op pool.  Please call with questions.  Corine Shelter, PA-C 12/15/2018, 5:06 PM

## 2018-12-15 NOTE — Assessment & Plan Note (Signed)
Rt > Lt claudication

## 2018-12-15 NOTE — Patient Instructions (Signed)
Medication Instructions:  Decrease Aspirin to 81 mg daily  If you need a refill on your cardiac medications before your next appointment, please call your pharmacy.    Testing/Procedures:  Early March: Your physician has requested that you have a lower extremity arterial duplex. During this test, ultrasound is used to evaluate arterial blood flow in the legs. Allow one hour for this exam. There are no restrictions or special instructions.  Your physician has requested that you have an ankle brachial index (ABI). During this test an ultrasound and blood pressure cuff are used to evaluate the arteries that supply the arms and legs with blood. Allow thirty minutes for this exam. There are no restrictions or special instructions.  Follow-Up: At [facility], you and your health needs are our priority.  As part of our continuing mission to provide you with exceptional heart care, we have created designated Provider Care Teams.  These Care Teams include your primary Cardiologist (physician) and Advanced Practice Providers (APPs -  Physician Assistants and Nurse Practitioners) who all work together to provide you with the care you need, when you need it. . We will call you with the results of your test and let you when to follow-up.  Any Other Special Instructions Will Be Listed Below (If Applicable). None

## 2018-12-15 NOTE — Assessment & Plan Note (Signed)
1/2 PPD

## 2018-12-16 ENCOUNTER — Telehealth: Payer: Self-pay | Admitting: *Deleted

## 2018-12-16 NOTE — Telephone Encounter (Signed)
Left message for patient to call and schedule f/u appointment with Dr. Allyson Sabal after 01/05/19 doppler studies

## 2018-12-16 NOTE — Progress Notes (Signed)
Thanks, Luke MCr 

## 2018-12-19 ENCOUNTER — Ambulatory Visit: Payer: Medicare Other | Admitting: Cardiovascular Disease

## 2018-12-22 ENCOUNTER — Other Ambulatory Visit: Payer: Self-pay

## 2018-12-22 ENCOUNTER — Encounter (HOSPITAL_COMMUNITY): Payer: Self-pay

## 2018-12-22 ENCOUNTER — Encounter (HOSPITAL_COMMUNITY)
Admission: RE | Admit: 2018-12-22 | Discharge: 2018-12-22 | Disposition: A | Discharge status: Home / Self Care | Payer: Medicare Other | Source: Ambulatory Visit | Attending: Urology | Admitting: Urology

## 2018-12-22 DIAGNOSIS — Z01812 Encounter for preprocedural laboratory examination: Secondary | ICD-10-CM | POA: Insufficient documentation

## 2018-12-22 DIAGNOSIS — R339 Retention of urine, unspecified: Secondary | ICD-10-CM | POA: Diagnosis not present

## 2018-12-22 HISTORY — DX: Elevated blood-pressure reading, without diagnosis of hypertension: R03.0

## 2018-12-22 LAB — CBC
HEMATOCRIT: 39.8 % (ref 39.0–52.0)
Hemoglobin: 12.8 g/dL — ABNORMAL LOW (ref 13.0–17.0)
MCH: 33.2 pg (ref 26.0–34.0)
MCHC: 32.2 g/dL (ref 30.0–36.0)
MCV: 103.1 fL — ABNORMAL HIGH (ref 80.0–100.0)
Platelets: 224 10*3/uL (ref 150–400)
RBC: 3.86 MIL/uL — ABNORMAL LOW (ref 4.22–5.81)
RDW: 16.6 % — ABNORMAL HIGH (ref 11.5–15.5)
WBC: 6.5 10*3/uL (ref 4.0–10.5)
nRBC: 0 % (ref 0.0–0.2)

## 2018-12-22 LAB — BASIC METABOLIC PANEL
Anion gap: 8 (ref 5–15)
BUN: 13 mg/dL (ref 8–23)
CO2: 26 mmol/L (ref 22–32)
Calcium: 9.6 mg/dL (ref 8.9–10.3)
Chloride: 104 mmol/L (ref 98–111)
Creatinine, Ser: 1.1 mg/dL (ref 0.61–1.24)
GFR calc Af Amer: >60 mL/min (ref >60)
GFR calc non Af Amer: >60 mL/min (ref >60)
Glucose, Bld: 90 mg/dL (ref 70–99)
Potassium: 4.1 mmol/L (ref 3.5–5.1)
Sodium: 138 mmol/L (ref 135–145)

## 2018-12-22 LAB — PROTIME-INR
INR: 1.05
Prothrombin Time: 13.6 seconds (ref 11.4–15.2)

## 2018-12-22 NOTE — Patient Instructions (Signed)
Joe Durham  12/22/2018   Your procedure is scheduled on: 12-29-2018   Report to [facility] Main  Entrance     Report to admitting at 8:00AM    Call this number if you have problems the morning of surgery [phone]      Remember: Do not eat food or drink liquids :After Midnight. BRUSH YOUR TEETH MORNING OF SURGERY AND RINSE YOUR MOUTH OUT, NO CHEWING GUM CANDY OR MINTS.     Take these medicines the morning of surgery with A SIP OF WATER: carvedilol, atorvastatin                                 You may not have any metal on your body including hair pins and              piercings  Do not wear jewelry, make-up, lotions, powders or perfumes, deodorant                     Men may shave face and neck.   Do not bring valuables to the hospital. [place] IS NOT             RESPONSIBLE   FOR VALUABLES.  Contacts, dentures or bridgework may not be worn into surgery.  Leave suitcase in the car. After surgery it may be brought to your room.                Please read over the following fact sheets you were given: _____________________________________________________________________             [facility] - Preparing for Surgery Before surgery, you can play an important role.  Because skin is not sterile, your skin needs to be as free of germs as possible.  You can reduce the number of germs on your skin by washing with CHG (chlorahexidine gluconate) soap before surgery.  CHG is an antiseptic cleaner which kills germs and bonds with the skin to continue killing germs even after washing. Please DO NOT use if you have an allergy to CHG or antibacterial soaps.  If your skin becomes reddened/irritated stop using the CHG and inform your nurse when you arrive at Short Stay. Do not shave (including legs and underarms) for at least 48 hours prior to the first CHG shower.  You may shave your face/neck. Please follow these instructions carefully:  1.  Shower  with CHG Soap the night before surgery and the  morning of Surgery.  2.  If you choose to wash your hair, wash your hair first as usual with your  normal  shampoo.  3.  After you shampoo, rinse your hair and body thoroughly to remove the  shampoo.                           4.  Use CHG as you would any other liquid soap.  You can apply chg directly  to the skin and wash                       Gently with a scrungie or clean washcloth.  5.  Apply the CHG Soap to your body ONLY FROM THE NECK DOWN.   Do not use on face/ open  Wound or open sores. Avoid contact with eyes, ears mouth and genitals (private parts).                       Wash face,  Genitals (private parts) with your normal soap.             6.  Wash thoroughly, paying special attention to the area where your surgery  will be performed.  7.  Thoroughly rinse your body with warm water from the neck down.  8.  DO NOT shower/wash with your normal soap after using and rinsing off  the CHG Soap.                9.  Pat yourself dry with a clean towel.            10.  Wear clean pajamas.            11.  Place clean sheets on your bed the night of your first shower and do not  sleep with pets. Day of Surgery : Do not apply any lotions/deodorants the morning of surgery.  Please wear clean clothes to the hospital/surgery center.  FAILURE TO FOLLOW THESE INSTRUCTIONS MAY RESULT IN THE CANCELLATION OF YOUR SURGERY PATIENT SIGNATURE_________________________________  NURSE SIGNATURE__________________________________  ________________________________________________________________________   WHAT IS A BLOOD TRANSFUSION? Blood Transfusion Information  A transfusion is the replacement of blood or some of its parts. Blood is made up of multiple cells which provide different functions.  Red blood cells carry oxygen and are used for blood loss replacement.  White blood cells fight against infection.  Platelets control  bleeding.  Plasma helps clot blood.  Other blood products are available for specialized needs, such as hemophilia or other clotting disorders. BEFORE THE TRANSFUSION  Who gives blood for transfusions?   Healthy volunteers who are fully evaluated to make sure their blood is safe. This is blood bank blood. Transfusion therapy is the safest it has ever been in the practice of medicine. Before blood is taken from a donor, a complete history is taken to make sure that person has no history of diseases nor engages in risky social behavior (examples are intravenous drug use or sexual activity with multiple partners). The donor's travel history is screened to minimize risk of transmitting infections, such as malaria. The donated blood is tested for signs of infectious diseases, such as HIV and hepatitis. The blood is then tested to be sure it is compatible with you in order to minimize the chance of a transfusion reaction. If you or a relative donates blood, this is often done in anticipation of surgery and is not appropriate for emergency situations. It takes many days to process the donated blood. RISKS AND COMPLICATIONS Although transfusion therapy is very safe and saves many lives, the main dangers of transfusion include:   Getting an infectious disease.  Developing a transfusion reaction. This is an allergic reaction to something in the blood you were given. Every precaution is taken to prevent this. The decision to have a blood transfusion has been considered carefully by your caregiver before blood is given. Blood is not given unless the benefits outweigh the risks. AFTER THE TRANSFUSION  Right after receiving a blood transfusion, you will usually feel much better and more energetic. This is especially true if your red blood cells have gotten low (anemic). The transfusion raises the level of the red blood cells which carry oxygen, and this usually causes an energy increase.  The nurse  administering the transfusion will monitor you carefully for complications. HOME CARE INSTRUCTIONS  No special instructions are needed after a transfusion. You may find your energy is better. Speak with your caregiver about any limitations on activity for underlying diseases you may have. SEEK MEDICAL CARE IF:   Your condition is not improving after your transfusion.  You develop redness or irritation at the intravenous (IV) site. SEEK IMMEDIATE MEDICAL CARE IF:  Any of the following symptoms occur over the next 12 hours:  Shaking chills.  You have a temperature by mouth above 102 F (38.9 C), not controlled by medicine.  Chest, back, or muscle pain.  People around you feel you are not acting correctly or are confused.  Shortness of breath or difficulty breathing.  Dizziness and fainting.  You get a rash or develop hives.  You have a decrease in urine output.  Your urine turns a dark color or changes to pink, red, or brown. Any of the following symptoms occur over the next 10 days:  You have a temperature by mouth above 102 F (38.9 C), not controlled by medicine.  Shortness of breath.  Weakness after normal activity.  The white part of the eye turns yellow (jaundice).  You have a decrease in the amount of urine or are urinating less often.  Your urine turns a dark color or changes to pink, red, or brown. Document Released: 10/19/2000 Document Revised: 01/14/2012 Document Reviewed: 06/07/2008 [facility] Patient Information 2014 [place], [place].  _______________________________________________________________________

## 2018-12-23 ENCOUNTER — Encounter (HOSPITAL_COMMUNITY): Payer: Self-pay | Admitting: Certified Registered Nurse Anesthetist

## 2018-12-23 ENCOUNTER — Encounter (HOSPITAL_COMMUNITY): Payer: Self-pay | Admitting: Physician Assistant

## 2018-12-23 LAB — ABO/RH: ABO/RH(D): A POS

## 2018-12-23 NOTE — Anesthesia Preprocedure Evaluation (Deleted)
Anesthesia Evaluation    Airway        Dental   Pulmonary Current Smoker,           Cardiovascular hypertension,      Neuro/Psych    GI/Hepatic   Endo/Other    Renal/GU      Musculoskeletal   Abdominal   Peds  Hematology   Anesthesia Other Findings   Reproductive/Obstetrics                             Anesthesia Physical Anesthesia Plan  ASA:   Anesthesia Plan:    Post-op Pain Management:    Induction:   PONV Risk Score and Plan:   Airway Management Planned:   Additional Equipment:   Intra-op Plan:   Post-operative Plan:   Informed Consent:   Plan Discussed with:   Anesthesia Plan Comments: (See PST note 12/22/18, Jodell Cipro, PA-C)        Anesthesia Quick Evaluation

## 2018-12-23 NOTE — Progress Notes (Signed)
Anesthesia Chart Review   Case:  [id number] Date/Time:  12/29/18 0945   Procedure:  TRANSURETHRAL RESECTION OF THE PROSTATE (TURP) (N/A )   Anesthesia type:  General   Pre-op diagnosis:  URINARY RETENTION   Location:  [facility] PROCEDURE ROOM / [facility]   Surgeon:  Crista Elliot, MD      DISCUSSION: 73 yo current every day smoker with h/o HTN, right PCA embolism ischemic infarct, PVD, s/p loop recorder implant 01/05/2015, CAD (s/p CABG x 3;09/05/2018), COPD, A-fib, Mobitz type 1 av block, urinary retention scheduled for above procedure 12/29/18 with Dr. Modena Slater.   Pt last seen by cardiology 12/15/18.  Seen by Corine Shelter, PA-C.  Per his note, "Given past medical history and time since last visit, based on ACC/AHA guidelines, Shakir Nam would be at acceptable risk for the planned procedure without further cardiovascular testing.  OK to hold aspirin 3-5 days pre op if needed."  Pt can proceed with planned procedure barring acute status change.  VS: BP (!) 193/81   Pulse (!) 58   Ht 5\' 8"  (1.727 m)   Wt 56.2 kg   SpO2 100%   BMI 18.82 kg/m   PROVIDERS: Patient, No Pcp Per  Croitoru, Mihai, MD is Cardiologist LABS: Labs reviewed: Acceptable for surgery. (all labs ordered are listed, but only abnormal results are displayed)  Labs Reviewed  CBC - Abnormal; Notable for the following components:      Result Value   RBC 3.86 (*)    Hemoglobin 12.8 (*)    MCV 103.1 (*)    RDW 16.6 (*)    All other components within normal limits  BASIC METABOLIC PANEL  PROTIME-INR  TYPE AND SCREEN  ABO/RH     IMAGES: Chest Xray 10/23/18 IMPRESSION: 1. Improved aeration with decrease in small pleural effusions and mild basilar atelectasis. 2. Stable hyperaeration consistent with emphysema. 3. Loop recorder overlies the left chest.  EKG: 12/15/2018 Rate 66 bpm Sinus rhythm with 2nd degree AV block (Mobitz I) with premature supraventricular complexes and with occasional premature ventricular  complexes Moderate voltage criteria for LVH, may be normal variant Cannot rule out Septal infarct, age undetermined  CV: Echo 09/05/18 Result status: Final result   Aortic valve: Mild valve calcification present.  Right ventricle: Normal cavity size, wall thickness and ejection fraction.   Cath 09/03/18 (Subsequent CABG x3)  Ost Cx to Prox Cx lesion is 99% stenosed.  Mid Cx to Dist Cx lesion is 80% stenosed.  Dist LM to Prox LAD lesion is 90% stenosed.  Ost 1st Diag lesion is 99% stenosed.  Prox RCA to Mid RCA lesion is 80% stenosed.  Dist RCA lesion is 60% stenosed.   1.  Severe heavily calcified three-vessel coronary artery disease.  The coronary arteries are very tortuous. 2.  Left ventricular angiography was not performed.  EF was normal by echo. 3.  Mildly elevated left ventricular end-diastolic pressure at 17 mmHg.  Recommendations: Recommend surgical consult for CABG.  PCI options are limited by severe calcifications and tortuosity that make atherectomy high risk for dissection and perforation. Past Medical History:  Diagnosis Date  . Bladder calculi   . COPD (chronic obstructive pulmonary disease) (HCC)   . DOE (dyspnea on exertion)   . Elevated blood pressure reading    12-22-2018 reprots he hasnt had his BP meds since this past thursday   . Foley catheter in place   . History of bladder stone   . History of embolic  stroke 12/2014   right PCA emobolism ischemic infarct (cyptogenic)  s/p LOOP recorder 01-05-2015;  07-08-2018 per pt no residual  . History of urinary retention   . Hypertension   . Mobitz type 1 second degree atrioventricular block   . Osteoarthritis   . Paroxysmal atrial tachycardia (HCC)   . Peripheral vascular disease (HCC)    last duplex 09-25-2016 bilateral ABI 0.6 and occulsion of  right popiteal posterior tibials and left peroneal  . Wears glasses     Past Surgical History:  Procedure Laterality Date  . CORONARY ARTERY BYPASS GRAFT  N/A 09/05/2018   Procedure: CORONARY ARTERY BYPASS GRAFTING (CABG) times 3 using left internal mammary artery and right greater saphenous vein harvested endoscopically.;  Surgeon: Kerin Perna, MD;  Location: [facility] OR;  Service: Open Heart Surgery;  Laterality: N/A;  . CYSTOSCOPY WITH LITHOLAPAXY N/A 07/15/2017   Procedure: OPEN CYSTOLITHOTOMY;  Surgeon: Crista Elliot, MD;  Location: [facility]S;  Service: Urology;  Laterality: N/A;  . INGUINAL HERNIA REPAIR Right 2011  . INSERTION OF SUPRAPUBIC CATHETER N/A 07/15/2017   Procedure: INSERTION OF SUPRAPUBIC CATHETER;  Surgeon: Crista Elliot, MD;  Location: [facility];  Service: Urology;  Laterality: N/A;  . LEFT HEART CATH AND CORONARY ANGIOGRAPHY N/A 09/03/2018   Procedure: LEFT HEART CATH AND CORONARY ANGIOGRAPHY;  Surgeon: Iran Ouch, MD;  Location: [facility] INVASIVE CV LAB;  Service: Cardiovascular;  Laterality: N/A;  . LOOP RECORDER IMPLANT N/A 01/05/2015   Procedure: LOOP RECORDER IMPLANT;  Surgeon: Thurmon Fair, MD;  Location: [facility] CATH LAB;  Service: Cardiovascular;  Laterality: N/A;  . ORIF WRIST FRACTURE Left 03/20/2014   Procedure: OPEN REDUCTION INTERNAL FIXATION (ORIF) WRIST FRACTURE;  Surgeon: Dominica Severin, MD;  Location: [facility] OR;  Service: Orthopedics;  Laterality: Left;  . TEE WITHOUT CARDIOVERSION N/A 01/05/2015   Procedure: TRANSESOPHAGEAL ECHOCARDIOGRAM (TEE)/LOOP;  Surgeon: Thurmon Fair, MD;  Location: [facility] ENDOSCOPY;  Service: Cardiovascular;  Laterality: N/A; ef 60-65%, no LA or RA thrombus, trivial PR, mild TR  . TEE WITHOUT CARDIOVERSION N/A 09/05/2018   Procedure: TRANSESOPHAGEAL ECHOCARDIOGRAM (TEE);  Surgeon: Donata Clay, Theron Arista, MD;  Location: [facility] OR;  Service: Open Heart Surgery;  Laterality: N/A;  . TONSILLECTOMY  child  . TOTAL HIP ARTHROPLASTY Right 2000    MEDICATIONS: . aspirin EC 81 MG tablet  . atorvastatin (LIPITOR) 80 MG tablet  . carvedilol (COREG) 6.25 MG tablet  . lisinopril (ZESTRIL) 2.5 MG tablet   No current  facility-administered medications for this encounter.    Janey Genta [facility] Pre-Surgical Testing [phone] 12/23/18 3:20 PM

## 2018-12-29 ENCOUNTER — Telehealth (HOSPITAL_COMMUNITY): Payer: Self-pay | Admitting: *Deleted

## 2018-12-29 ENCOUNTER — Encounter (HOSPITAL_COMMUNITY): Admission: RE | Payer: Self-pay | Source: Home / Self Care

## 2018-12-29 ENCOUNTER — Ambulatory Visit: Payer: Medicare Other | Admitting: Internal Medicine

## 2018-12-29 ENCOUNTER — Ambulatory Visit (HOSPITAL_COMMUNITY): Admission: RE | Admit: 2018-12-29 | Payer: Medicare Other | Source: Home / Self Care | Admitting: Urology

## 2018-12-29 LAB — TYPE AND SCREEN
ABO/RH(D): A POS
Antibody Screen: NEGATIVE

## 2018-12-29 SURGERY — TURP (TRANSURETHRAL RESECTION OF PROSTATE)
Anesthesia: General

## 2018-12-29 MED ORDER — FENTANYL CITRATE (PF) 100 MCG/2ML IJ SOLN
INTRAMUSCULAR | Status: AC
  Filled 2018-12-29: qty 2

## 2018-12-29 MED ORDER — PROPOFOL 10 MG/ML IV BOLUS
INTRAVENOUS | Status: AC
  Filled 2018-12-29: qty 20

## 2018-12-29 MED ORDER — MIDAZOLAM HCL 2 MG/2ML IJ SOLN
INTRAMUSCULAR | Status: AC
  Filled 2018-12-29: qty 2

## 2019-01-05 ENCOUNTER — Encounter (HOSPITAL_COMMUNITY): Payer: Medicare Other

## 2019-01-05 ENCOUNTER — Ambulatory Visit (HOSPITAL_COMMUNITY)
Admission: RE | Admit: 2019-01-05 | Payer: Medicare Other | Source: Ambulatory Visit | Attending: Cardiology | Admitting: Cardiology

## 2019-01-06 ENCOUNTER — Encounter (HOSPITAL_COMMUNITY): Payer: Self-pay

## 2019-01-21 ENCOUNTER — Ambulatory Visit: Payer: Medicare Other | Admitting: Cardiovascular Disease

## 2019-02-11 ENCOUNTER — Telehealth: Payer: Self-pay | Admitting: Cardiology

## 2019-02-11 NOTE — Telephone Encounter (Signed)
LVM to pre reg. 02-11-19 ST °

## 2019-02-12 ENCOUNTER — Telehealth: Payer: Self-pay | Admitting: Cardiovascular Disease

## 2019-02-12 NOTE — Telephone Encounter (Signed)
LVM for pre reg. Note in chart. 02-12-19. ST

## 2019-02-13 ENCOUNTER — Telehealth: Payer: Self-pay | Admitting: Cardiovascular Disease

## 2019-02-13 ENCOUNTER — Ambulatory Visit (HOSPITAL_COMMUNITY)
Admission: RE | Admit: 2019-02-13 | Discharge: 2019-02-13 | Disposition: A | Discharge status: Home / Self Care | Payer: Medicare Other | Source: Ambulatory Visit | Attending: Cardiology | Admitting: Cardiology

## 2019-02-13 ENCOUNTER — Encounter (HOSPITAL_COMMUNITY): Payer: Self-pay

## 2019-02-13 DIAGNOSIS — I739 Peripheral vascular disease, unspecified: Secondary | ICD-10-CM

## 2019-02-13 NOTE — Telephone Encounter (Signed)
LVM for pre reg. Note im chart. 02-13-19. ST

## 2019-02-18 ENCOUNTER — Telehealth: Payer: Self-pay | Admitting: *Deleted

## 2019-02-18 NOTE — Telephone Encounter (Signed)
Patient call

## 2019-02-20 ENCOUNTER — Ambulatory Visit: Payer: Medicare Other | Admitting: Cardiovascular Disease

## 2019-05-01 ENCOUNTER — Emergency Department (HOSPITAL_COMMUNITY): Payer: Medicare Other

## 2019-05-01 ENCOUNTER — Inpatient Hospital Stay (HOSPITAL_COMMUNITY)
Admission: EM | Admit: 2019-05-01 | Discharge: 2019-05-06 | DRG: 698 | Disposition: A | Discharge status: Home / Self Care | Payer: Medicare Other | Attending: Internal Medicine | Admitting: Internal Medicine

## 2019-05-01 DIAGNOSIS — N210 Calculus in bladder: Secondary | ICD-10-CM | POA: Diagnosis not present

## 2019-05-01 DIAGNOSIS — E876 Hypokalemia: Secondary | ICD-10-CM | POA: Diagnosis present

## 2019-05-01 DIAGNOSIS — J449 Chronic obstructive pulmonary disease, unspecified: Secondary | ICD-10-CM | POA: Diagnosis present

## 2019-05-01 DIAGNOSIS — I4891 Unspecified atrial fibrillation: Secondary | ICD-10-CM

## 2019-05-01 DIAGNOSIS — N3001 Acute cystitis with hematuria: Secondary | ICD-10-CM | POA: Diagnosis present

## 2019-05-01 DIAGNOSIS — Z716 Tobacco abuse counseling: Secondary | ICD-10-CM

## 2019-05-01 DIAGNOSIS — I480 Paroxysmal atrial fibrillation: Secondary | ICD-10-CM | POA: Diagnosis present

## 2019-05-01 DIAGNOSIS — I358 Other nonrheumatic aortic valve disorders: Secondary | ICD-10-CM | POA: Diagnosis present

## 2019-05-01 DIAGNOSIS — I10 Essential (primary) hypertension: Secondary | ICD-10-CM | POA: Diagnosis present

## 2019-05-01 DIAGNOSIS — F17210 Nicotine dependence, cigarettes, uncomplicated: Secondary | ICD-10-CM | POA: Diagnosis present

## 2019-05-01 DIAGNOSIS — R6520 Severe sepsis without septic shock: Secondary | ICD-10-CM | POA: Diagnosis not present

## 2019-05-01 DIAGNOSIS — N10 Acute tubulo-interstitial nephritis: Secondary | ICD-10-CM | POA: Diagnosis present

## 2019-05-01 DIAGNOSIS — E538 Deficiency of other specified B group vitamins: Secondary | ICD-10-CM | POA: Diagnosis present

## 2019-05-01 DIAGNOSIS — A4189 Other specified sepsis: Secondary | ICD-10-CM | POA: Diagnosis present

## 2019-05-01 DIAGNOSIS — E739 Lactose intolerance, unspecified: Secondary | ICD-10-CM | POA: Diagnosis present

## 2019-05-01 DIAGNOSIS — I2510 Atherosclerotic heart disease of native coronary artery without angina pectoris: Secondary | ICD-10-CM | POA: Diagnosis present

## 2019-05-01 DIAGNOSIS — N136 Pyonephrosis: Secondary | ICD-10-CM | POA: Diagnosis present

## 2019-05-01 DIAGNOSIS — N179 Acute kidney failure, unspecified: Secondary | ICD-10-CM | POA: Diagnosis present

## 2019-05-01 DIAGNOSIS — K4090 Unilateral inguinal hernia, without obstruction or gangrene, not specified as recurrent: Secondary | ICD-10-CM | POA: Diagnosis present

## 2019-05-01 DIAGNOSIS — D638 Anemia in other chronic diseases classified elsewhere: Secondary | ICD-10-CM | POA: Diagnosis present

## 2019-05-01 DIAGNOSIS — N390 Urinary tract infection, site not specified: Secondary | ICD-10-CM | POA: Diagnosis not present

## 2019-05-01 DIAGNOSIS — Y846 Urinary catheterization as the cause of abnormal reaction of the patient, or of later complication, without mention of misadventure at the time of the procedure: Secondary | ICD-10-CM | POA: Diagnosis present

## 2019-05-01 DIAGNOSIS — I441 Atrioventricular block, second degree: Secondary | ICD-10-CM | POA: Diagnosis present

## 2019-05-01 DIAGNOSIS — Z1159 Encounter for screening for other viral diseases: Secondary | ICD-10-CM

## 2019-05-01 DIAGNOSIS — R7881 Bacteremia: Secondary | ICD-10-CM | POA: Diagnosis present

## 2019-05-01 DIAGNOSIS — I739 Peripheral vascular disease, unspecified: Secondary | ICD-10-CM | POA: Diagnosis present

## 2019-05-01 DIAGNOSIS — Z8673 Personal history of transient ischemic attack (TIA), and cerebral infarction without residual deficits: Secondary | ICD-10-CM

## 2019-05-01 DIAGNOSIS — B9689 Other specified bacterial agents as the cause of diseases classified elsewhere: Secondary | ICD-10-CM | POA: Diagnosis present

## 2019-05-01 DIAGNOSIS — R059 Cough, unspecified: Secondary | ICD-10-CM

## 2019-05-01 DIAGNOSIS — R911 Solitary pulmonary nodule: Secondary | ICD-10-CM | POA: Diagnosis present

## 2019-05-01 DIAGNOSIS — E785 Hyperlipidemia, unspecified: Secondary | ICD-10-CM | POA: Diagnosis present

## 2019-05-01 DIAGNOSIS — I25119 Atherosclerotic heart disease of native coronary artery with unspecified angina pectoris: Secondary | ICD-10-CM | POA: Diagnosis not present

## 2019-05-01 DIAGNOSIS — Z951 Presence of aortocoronary bypass graft: Secondary | ICD-10-CM

## 2019-05-01 DIAGNOSIS — T83028A Displacement of other indwelling urethral catheter, initial encounter: Secondary | ICD-10-CM | POA: Diagnosis present

## 2019-05-01 DIAGNOSIS — R05 Cough: Secondary | ICD-10-CM | POA: Diagnosis not present

## 2019-05-01 DIAGNOSIS — D649 Anemia, unspecified: Secondary | ICD-10-CM

## 2019-05-01 DIAGNOSIS — A419 Sepsis, unspecified organism: Secondary | ICD-10-CM | POA: Diagnosis not present

## 2019-05-01 DIAGNOSIS — R0902 Hypoxemia: Secondary | ICD-10-CM

## 2019-05-01 DIAGNOSIS — M25571 Pain in right ankle and joints of right foot: Secondary | ICD-10-CM | POA: Diagnosis present

## 2019-05-01 DIAGNOSIS — B964 Proteus (mirabilis) (morganii) as the cause of diseases classified elsewhere: Secondary | ICD-10-CM | POA: Diagnosis present

## 2019-05-01 DIAGNOSIS — I361 Nonrheumatic tricuspid (valve) insufficiency: Secondary | ICD-10-CM | POA: Diagnosis not present

## 2019-05-01 DIAGNOSIS — D539 Nutritional anemia, unspecified: Secondary | ICD-10-CM | POA: Diagnosis present

## 2019-05-01 LAB — CBC WITH DIFFERENTIAL/PLATELET
Abs Immature Granulocytes: 0.29 10*3/uL — ABNORMAL HIGH (ref 0.00–0.07)
Basophils Absolute: 0.1 10*3/uL (ref 0.0–0.1)
Basophils Relative: 0 %
Eosinophils Absolute: 0 10*3/uL (ref 0.0–0.5)
Eosinophils Relative: 0 %
HCT: 45.3 % (ref 39.0–52.0)
Hemoglobin: 15.5 g/dL (ref 13.0–17.0)
Immature Granulocytes: 1 %
Lymphocytes Relative: 4 %
Lymphs Abs: 1.2 10*3/uL (ref 0.7–4.0)
MCH: 36.4 pg — ABNORMAL HIGH (ref 26.0–34.0)
MCHC: 34.2 g/dL (ref 30.0–36.0)
MCV: 106.3 fL — ABNORMAL HIGH (ref 80.0–100.0)
Monocytes Absolute: 1.9 10*3/uL — ABNORMAL HIGH (ref 0.1–1.0)
Monocytes Relative: 6 %
Neutro Abs: 28.5 10*3/uL — ABNORMAL HIGH (ref 1.7–7.7)
Neutrophils Relative %: 89 %
Platelets: 220 10*3/uL (ref 150–400)
RBC: 4.26 MIL/uL (ref 4.22–5.81)
RDW: 14.8 % (ref 11.5–15.5)
WBC: 31.9 10*3/uL — ABNORMAL HIGH (ref 4.0–10.5)
nRBC: 0 % (ref 0.0–0.2)

## 2019-05-01 LAB — URINALYSIS, ROUTINE W REFLEX MICROSCOPIC
Bilirubin Urine: NEGATIVE
Glucose, UA: NEGATIVE mg/dL
Ketones, ur: 5 mg/dL — AB
Nitrite: NEGATIVE
Protein, ur: >=300 mg/dL — AB
RBC / HPF: >50 RBC/hpf — ABNORMAL HIGH (ref 0–5)
Specific Gravity, Urine: 1.014 (ref 1.005–1.030)
WBC, UA: >50 WBC/hpf — ABNORMAL HIGH (ref 0–5)
pH: 7 (ref 5.0–8.0)

## 2019-05-01 LAB — COMPREHENSIVE METABOLIC PANEL
ALT: 16 U/L (ref 0–44)
AST: 26 U/L (ref 15–41)
Albumin: 4.2 g/dL (ref 3.5–5.0)
Alkaline Phosphatase: 70 U/L (ref 38–126)
Anion gap: 14 (ref 5–15)
BUN: 37 mg/dL — ABNORMAL HIGH (ref 8–23)
CO2: 25 mmol/L (ref 22–32)
Calcium: 9.8 mg/dL (ref 8.9–10.3)
Chloride: 96 mmol/L — ABNORMAL LOW (ref 98–111)
Creatinine, Ser: 2.4 mg/dL — ABNORMAL HIGH (ref 0.61–1.24)
GFR calc Af Amer: 30 mL/min — ABNORMAL LOW (ref >60)
GFR calc non Af Amer: 26 mL/min — ABNORMAL LOW (ref >60)
Glucose, Bld: 109 mg/dL — ABNORMAL HIGH (ref 70–99)
Potassium: 4.2 mmol/L (ref 3.5–5.1)
Sodium: 135 mmol/L (ref 135–145)
Total Bilirubin: 3.3 mg/dL — ABNORMAL HIGH (ref 0.3–1.2)
Total Protein: 9.1 g/dL — ABNORMAL HIGH (ref 6.5–8.1)

## 2019-05-01 LAB — LACTIC ACID, PLASMA
Lactic Acid, Venous: 3.4 mmol/L (ref 0.5–1.9)
Lactic Acid, Venous: 4.8 mmol/L (ref 0.5–1.9)

## 2019-05-01 LAB — SARS CORONAVIRUS 2 BY RT PCR (HOSPITAL ORDER, PERFORMED IN ~~LOC~~ HOSPITAL LAB): SARS Coronavirus 2: NEGATIVE

## 2019-05-01 MED ORDER — CARVEDILOL 6.25 MG PO TABS
6.2500 mg | ORAL_TABLET | Freq: Two times a day (BID) | ORAL | Status: DC
  Administered 2019-05-01 – 2019-05-02 (×3): 6.25 mg via ORAL
  Administered 2019-05-03: 3.125 mg via ORAL
  Filled 2019-05-01 (×4): qty 1

## 2019-05-01 MED ORDER — HYDROCODONE-ACETAMINOPHEN 5-325 MG PO TABS
1.0000 | ORAL_TABLET | ORAL | Status: DC | PRN
  Administered 2019-05-02 – 2019-05-05 (×6): 1 via ORAL
  Filled 2019-05-01 (×6): qty 1

## 2019-05-01 MED ORDER — ACETAMINOPHEN 325 MG PO TABS
650.0000 mg | ORAL_TABLET | Freq: Once | ORAL | Status: AC
  Administered 2019-05-01: 650 mg via ORAL
  Filled 2019-05-01: qty 2

## 2019-05-01 MED ORDER — SODIUM CHLORIDE 0.9 % IV SOLN
INTRAVENOUS | Status: DC
  Administered 2019-05-01 – 2019-05-03 (×6): via INTRAVENOUS

## 2019-05-01 MED ORDER — HEPARIN (PORCINE) 25000 UT/250ML-% IV SOLN
900.0000 [IU]/h | INTRAVENOUS | Status: DC
  Administered 2019-05-01: 900 [IU]/h via INTRAVENOUS
  Filled 2019-05-01: qty 250

## 2019-05-01 MED ORDER — HEPARIN BOLUS VIA INFUSION
3000.0000 [IU] | Freq: Once | INTRAVENOUS | Status: AC
  Administered 2019-05-01: 3000 [IU] via INTRAVENOUS
  Filled 2019-05-01: qty 3000

## 2019-05-01 MED ORDER — SODIUM CHLORIDE 0.9 % IV SOLN
1.0000 g | INTRAVENOUS | Status: DC
  Administered 2019-05-01: 1 g via INTRAVENOUS
  Filled 2019-05-01: qty 10
  Filled 2019-05-01: qty 1

## 2019-05-01 MED ORDER — SODIUM CHLORIDE 0.9 % IV BOLUS
500.0000 mL | Freq: Once | INTRAVENOUS | Status: AC
  Administered 2019-05-01: 500 mL via INTRAVENOUS

## 2019-05-01 MED ORDER — ACETAMINOPHEN 325 MG PO TABS: 650.0000 mg | ORAL_TABLET | Freq: Four times a day (QID) | ORAL | Status: DC | PRN

## 2019-05-01 MED ORDER — SODIUM CHLORIDE 0.9 % IV BOLUS
1000.0000 mL | Freq: Once | INTRAVENOUS | Status: AC
  Administered 2019-05-01: 1000 mL via INTRAVENOUS

## 2019-05-01 MED ORDER — SODIUM CHLORIDE 0.9% FLUSH
3.0000 mL | Freq: Two times a day (BID) | INTRAVENOUS | Status: DC
  Administered 2019-05-01 – 2019-05-06 (×8): 3 mL via INTRAVENOUS

## 2019-05-01 MED ORDER — HYDRALAZINE HCL 20 MG/ML IJ SOLN: 10.0000 mg | Freq: Four times a day (QID) | INTRAMUSCULAR | Status: DC | PRN

## 2019-05-01 MED ORDER — ONDANSETRON HCL 4 MG PO TABS: 4.0000 mg | ORAL_TABLET | Freq: Four times a day (QID) | ORAL | Status: DC | PRN

## 2019-05-01 MED ORDER — ACETAMINOPHEN 650 MG RE SUPP: 650.0000 mg | Freq: Four times a day (QID) | RECTAL | Status: DC | PRN

## 2019-05-01 MED ORDER — SODIUM CHLORIDE 0.9 % IV BOLUS: 500.0000 mL | Freq: Once | INTRAVENOUS | Status: DC

## 2019-05-01 MED ORDER — ONDANSETRON HCL 4 MG/2ML IJ SOLN: 4.0000 mg | Freq: Four times a day (QID) | INTRAMUSCULAR | Status: DC | PRN

## 2019-05-01 MED ORDER — ASPIRIN EC 81 MG PO TBEC
81.0000 mg | DELAYED_RELEASE_TABLET | Freq: Every day | ORAL | Status: DC
  Administered 2019-05-02 – 2019-05-04 (×3): 81 mg via ORAL
  Filled 2019-05-01 (×3): qty 1

## 2019-05-01 MED ORDER — ATORVASTATIN CALCIUM 40 MG PO TABS
80.0000 mg | ORAL_TABLET | Freq: Every day | ORAL | Status: DC
  Administered 2019-05-02 – 2019-05-06 (×5): 80 mg via ORAL
  Filled 2019-05-01 (×5): qty 2

## 2019-05-01 MED ORDER — PIPERACILLIN-TAZOBACTAM 3.375 G IVPB 30 MIN
3.3750 g | Freq: Once | INTRAVENOUS | Status: AC
  Administered 2019-05-01: 3.375 g via INTRAVENOUS
  Filled 2019-05-01: qty 50

## 2019-05-01 NOTE — ED Notes (Signed)
Date and time results received: 05/01/19 (use smartphrase ".now" to insert current time)  Test: Lactic Acid Critical Value: 4.8  Name of Provider Notified: Tamera Punt MD

## 2019-05-01 NOTE — H&P (Signed)
History and Physical    Joe Durham WNI:[id number] DOB: October 07, 1946 DOA: 05/01/2019  PCP: Patient, No Pcp Per  Patient coming from: Home  I have personally briefly reviewed patient's old medical records in [place]  Chief Complaint: Abdominal pressure/dislodging of suprapubic catheter  HPI: Joe Durham is a 73 y.o. male with medical history significant of recurrent renal stones and hematuria, CAD status post CABG, hypertension and hyperlipidemia, PAT, COPD presented to ER with a complaint of abdominal pressure and dislodging of suprapubic catheter.  According to patient, his suprapubic catheter fell off suddenly 2 days ago.  Since then he has been having suprapubic pressure/pain which has gotten significantly worse over the past 24 hours.  He has also been having some shortness of breath with exertion.  Denies any other complaints such as cough, fever, diarrhea, nausea, vomiting, dizziness, headache or any other complaint.  No sick contacts and no recent travel.  He has noticed some dribbling of urine from suprapubic catheter insertion site and from penis.  ED Course: In the ED, he had low-grade fever of 100.7 with some tachycardia up to 106.  CBC showed severe leukocytosis of more than 31 and CMP showed creatinine of 2.4 and bilirubin of 3.3.  Urine analysis was positive for bacteria, white cells and leukoesterase.  He was diagnosed with sepsis due to UTI.  He was given Zosyn.  Suprapubic catheter was replaced and hospital service was consulted for admission.  Review of Systems: As per HPI otherwise 10 point review of systems negative.    Past Medical History:  Diagnosis Date  . Bladder calculi   . COPD (chronic obstructive pulmonary disease) ([place])   . DOE (dyspnea on exertion)   . Elevated blood pressure reading    12-22-2018 reprots he hasnt had his BP meds since this past thursday   . Foley catheter in place   . History of bladder stone   . History of embolic stroke  38/1829   right PCA emobolism ischemic infarct (cyptogenic)  s/p LOOP recorder 01-05-2015;  07-08-2018 per pt no residual  . History of urinary retention   . Hypertension   . Mobitz type 1 second degree atrioventricular block   . Osteoarthritis   . Paroxysmal atrial tachycardia ([place])   . Peripheral vascular disease ([place])    last duplex 09-25-2016 bilateral ABI 0.6 and occulsion of  right popiteal posterior tibials and left peroneal  . Wears glasses     Past Surgical History:  Procedure Laterality Date  . CORONARY ARTERY BYPASS GRAFT N/A 09/05/2018   Procedure: CORONARY ARTERY BYPASS GRAFTING (CABG) times 3 using left internal mammary artery and right greater saphenous vein harvested endoscopically.;  Surgeon: Ivin Poot, MD;  Location: [facility];  Service: Open Heart Surgery;  Laterality: N/A;  . CYSTOSCOPY WITH LITHOLAPAXY N/A 07/15/2017   Procedure: OPEN CYSTOLITHOTOMY;  Surgeon: Lucas Mallow, MD;  Location: [facility];  Service: Urology;  Laterality: N/A;  . INGUINAL HERNIA REPAIR Right 2011  . INSERTION OF SUPRAPUBIC CATHETER N/A 07/15/2017   Procedure: INSERTION OF SUPRAPUBIC CATHETER;  Surgeon: Lucas Mallow, MD;  Location: [facility];  Service: Urology;  Laterality: N/A;  . LEFT HEART CATH AND CORONARY ANGIOGRAPHY N/A 09/03/2018   Procedure: LEFT HEART CATH AND CORONARY ANGIOGRAPHY;  Surgeon: Wellington Hampshire, MD;  Location: [facility];  Service: Cardiovascular;  Laterality: N/A;  . LOOP RECORDER IMPLANT N/A 01/05/2015   Procedure: LOOP RECORDER IMPLANT;  Surgeon: Sanda Klein, MD;  Location: [facility];  Service: Cardiovascular;  Laterality: N/A;  . ORIF WRIST FRACTURE Left 03/20/2014   Procedure: OPEN REDUCTION INTERNAL FIXATION (ORIF) WRIST FRACTURE;  Surgeon: Dominica SeverinWilliam Gramig, MD;  Location: [facility];  Service: Orthopedics;  Laterality: Left;  . TEE WITHOUT CARDIOVERSION N/A 01/05/2015   Procedure: TRANSESOPHAGEAL ECHOCARDIOGRAM (TEE)/LOOP;  Surgeon: Thurmon FairMihai Croitoru, MD;   Location: [facility];  Service: Cardiovascular;  Laterality: N/A; ef 60-65%, no LA or RA thrombus, trivial PR, mild TR  . TEE WITHOUT CARDIOVERSION N/A 09/05/2018   Procedure: TRANSESOPHAGEAL ECHOCARDIOGRAM (TEE);  Surgeon: Donata ClayVan Trigt, Theron AristaPeter, MD;  [facility];  Service: Open Heart Surgery;  Laterality: N/A;  . TONSILLECTOMY  child  . TOTAL HIP ARTHROPLASTY Right 2000     reports that he has been smoking cigarettes. He has smoked for the past 52.00 years. He has never used smokeless tobacco. He reports current alcohol use. He reports that he does not use drugs.  Allergies  Allergen Reactions  . Lactose Intolerance (Gi) Diarrhea    Family History  Problem Relation Age of Onset  . Stroke Mother   . Stroke Sister     Prior to Admission medications   Medication Sig Start Date End Date Taking? Authorizing Provider  aspirin EC 81 MG tablet Take 1 tablet (81 mg total) by mouth daily. 12/15/18   Abelino DerrickKilroy, Luke K, PA-C  atorvastatin (LIPITOR) 80 MG tablet Take 1 tablet (80 mg total) by mouth daily. 12/15/18   Abelino DerrickKilroy, Luke K, PA-C  carvedilol (COREG) 6.25 MG tablet Take 1 tablet (6.25 mg total) by mouth 2 (two) times daily. 12/15/18 12/15/19  Abelino DerrickKilroy, Luke K, PA-C  lisinopril (ZESTRIL) 2.5 MG tablet Take 1 tablet (2.5 mg total) by mouth daily. 12/15/18   Abelino DerrickKilroy, Luke K, PA-C    Physical Exam: Vitals:   05/01/19 1343 05/01/19 1400 05/01/19 1430 05/01/19 1500  BP:  112/76 (!) 142/87 127/78  Pulse:    (!) 106  Resp:      Temp: (!) 100.7 F (38.2 C)     TempSrc: Rectal     SpO2:    96%  Weight:      Height:        Constitutional: NAD, calm, comfortable Vitals:   05/01/19 1343 05/01/19 1400 05/01/19 1430 05/01/19 1500  BP:  112/76 (!) 142/87 127/78  Pulse:    (!) 106  Resp:      Temp: (!) 100.7 F (38.2 C)     TempSrc: Rectal     SpO2:    96%  Weight:      Height:       Eyes: PERRL, lids and conjunctivae normal ENMT: Mucous membranes are moist. Posterior pharynx clear of any  exudate or lesions.Normal dentition.  Neck: normal, supple, no masses, no thyromegaly Respiratory: clear to auscultation bilaterally, no wheezing, no crackles. Normal respiratory effort. No accessory muscle use.  Cardiovascular: Irregularly irregular rate and rhythm, no murmurs / rubs / gallops. No extremity edema. 2+ pedal pulses. No carotid bruits.  Abdomen: Suprapubic tenderness, no masses palpated. No hepatosplenomegaly. Bowel sounds positive.  Musculoskeletal: no clubbing / cyanosis. No joint deformity upper and lower extremities. Good ROM, no contractures. Normal muscle tone.  Skin: no rashes, lesions, ulcers. No induration Neurologic: CN 2-12 grossly intact. Sensation intact, DTR normal. Strength 5/5 in all 4.  Psychiatric: Normal judgment and insight. Alert and oriented x 3. Normal mood.    Labs on Admission: I have personally reviewed following labs and imaging studies  CBC: Recent  Labs  Lab 05/01/19 1400  WBC 31.9*  NEUTROABS 28.5*  HGB 15.5  HCT 45.3  MCV 106.3*  PLT 220   Basic Metabolic Panel: Recent Labs  Lab 05/01/19 1400  NA 135  K 4.2  CL 96*  CO2 25  GLUCOSE 109*  BUN 37*  CREATININE 2.40*  CALCIUM 9.8   GFR: Estimated Creatinine Clearance: 23.6 mL/min (A) (by C-G formula based on SCr of 2.4 mg/dL (H)). Liver Function Tests: Recent Labs  Lab 05/01/19 1400  AST 26  ALT 16  ALKPHOS 70  BILITOT 3.3*  PROT 9.1*  ALBUMIN 4.2   No results for input(s): LIPASE, AMYLASE in the last 168 hours. No results for input(s): AMMONIA in the last 168 hours. Coagulation Profile: No results for input(s): INR, PROTIME in the last 168 hours. Cardiac Enzymes: No results for input(s): CKTOTAL, CKMB, CKMBINDEX, TROPONINI in the last 168 hours. BNP (last 3 results) No results for input(s): PROBNP in the last 8760 hours. HbA1C: No results for input(s): HGBA1C in the last 72 hours. CBG: No results for input(s): GLUCAP in the last 168 hours. Lipid Profile: No  results for input(s): CHOL, HDL, LDLCALC, TRIG, CHOLHDL, LDLDIRECT in the last 72 hours. Thyroid Function Tests: No results for input(s): TSH, T4TOTAL, FREET4, T3FREE, THYROIDAB in the last 72 hours. Anemia Panel: No results for input(s): VITAMINB12, FOLATE, FERRITIN, TIBC, IRON, RETICCTPCT in the last 72 hours. Urine analysis:    Component Value Date/Time   COLORURINE YELLOW 05/01/2019 1420   APPEARANCEUR TURBID (A) 05/01/2019 1420   LABSPEC 1.014 05/01/2019 1420   PHURINE 7.0 05/01/2019 1420   GLUCOSEU NEGATIVE 05/01/2019 1420   HGBUR SMALL (A) 05/01/2019 1420   BILIRUBINUR NEGATIVE 05/01/2019 1420   KETONESUR 5 (A) 05/01/2019 1420   PROTEINUR >=300 (A) 05/01/2019 1420   UROBILINOGEN 1.0 03/20/2014 1241   NITRITE NEGATIVE 05/01/2019 1420   LEUKOCYTESUR MODERATE (A) 05/01/2019 1420    Radiological Exams on Admission: No results found.  EKG: Independently reviewed.  Atrial fibrillation with rates at 98 bpm.  Assessment/Plan Active Problems:   Bladder stones   Essential hypertension   AKI (acute kidney injury) ([facility])   S/P CABG x 07 Sep 2018   Coronary artery disease   Sepsis secondary to UTI ([facility])    Sepsis due to UTI: He meets sepsis criteria based on tachycardia, fever and leukocytosis.  This is likely secondary to UTI.  He received Zosyn in the ER.  I will start him on Rocephin and follow urine as well as blood culture and tailor antibiotics.  Start on normal saline at 125 cc/h.  Repeat CBC and CMP in the morning.  Acute kidney injury: Likely be secondary to obstruction and sepsis.  Now that he has suprapubic catheter so obstruction should resolve.  We will hydrate him for sepsis and repeat labs in the morning.  Avoid nephrotoxic agents.  Hold home dose of lisinopril.  Hypertension: Can hold.  Hold lisinopril due to AKI.  Start on hydralazine as needed.  New onset atrial fibrillation: Has a history of second-degree heart block and paroxysmal atrial tachycardia in the past  but I could not find a history of atrial fibrillation and patient is not aware of either.  EKG today shows atrial fibrillation with rates around 98.  I will resume his home dose of carvedilol and monitor on telemetry.  I will start him on heparin and I have consulted pharmacy to dose that.  I have paged cardiology for consultation.  Waiting for  response.  Acute urinary retention/dislodging of suprapubic catheter: Suprapubic catheter has been inserted in the ED.  DVT prophylaxis: Heparin Code Status: Full code Family Communication: None present at bedside.  Patient alert oriented and competent.  Discussed plan of admission with the patient in detail. Disposition Plan: Most likely home in next 2 to 3 days. Consults called: Cardiology Admission status: Inpatient in telemetry   Hughie Closs MD [facility] Pager [phone]  If 7PM-7AM, please contact night-coverage [email or web address] Password [facility]  05/01/2019, 3:46 PM

## 2019-05-01 NOTE — Progress Notes (Signed)
[facility] for heparin Indication: new onset atrial fibrillation  Allergies  Allergen Reactions  . Lactose Intolerance (Gi) Diarrhea    Patient Measurements: Height: 5\' 8"  (172.7 cm) Weight: 130 lb (59 kg) IBW/kg (Calculated) : 68.4 Heparin Dosing Weight: 59 kg  Vital Signs: Temp: 100.7 F (38.2 C) (06/26 1343) Temp Source: Rectal (06/26 1343) BP: 127/78 (06/26 1500) Pulse Rate: 106 (06/26 1500)  Labs: Recent Labs    05/01/19 1400  HGB 15.5  HCT 45.3  PLT 220  CREATININE 2.40*    Estimated Creatinine Clearance: 23.6 mL/min (A) (by C-G formula based on SCr of 2.4 mg/dL (H)).   Assessment: Patient's a 73 y.o M with hx recurrent renal stones and hematuria and CABG presented to the ED on 6/26 with dislodged suprapubic cath and c/o abd pain.  He was found to have new onset afib.  To start heparin drip for afib.  Goal of Therapy:  Heparin level 0.3-0.7 units/ml Monitor platelets by anticoagulation protocol: Yes   Plan:  - heparin 3000 units IV x1 bolus, then drip at 900 units/hr - check 8 hr heparin level - monitor for s/s bleeding  Nickie Warwick P 05/01/2019,4:13 PM

## 2019-05-01 NOTE — ED Notes (Signed)
Attempted to call nursing report, nurse not available at this time, informed will call back.

## 2019-05-01 NOTE — ED Notes (Signed)
Bed: WA21 Expected date:  Expected time:  Means of arrival:  Comments: EMS-cath dislodged

## 2019-05-01 NOTE — ED Triage Notes (Signed)
Pt to ED via EMS pt from home. Cath displaced 2 days. Pt denies pain. Ambulatory. Orientation at baseline. A&O X 4

## 2019-05-01 NOTE — ED Notes (Signed)
Bladder scan= 36 ML

## 2019-05-01 NOTE — ED Notes (Signed)
Date and time results received: 05/01/19  (use smartphrase ".now" to insert current time)  Test: Lactic Acid Critical Value: 3.4  Name of Provider Notified: Little

## 2019-05-01 NOTE — ED Notes (Signed)
Pt informs this nurse his indwelling foley fell out 2 days ago when he was sleeping, reports urinating since "all over himself"

## 2019-05-01 NOTE — ED Provider Notes (Signed)
[facility]-EMERGENCY DEPT Provider Note   CSN: [id number] Arrival date & time: 05/01/19  1251     History   Chief Complaint Chief Complaint  Patient presents with  . foley cath came out    HPI Joe Durham is a 73 y.o. male.     HPI  Patient is a 73 year old male with past medical history of bladder calculi requiring suprapubic catheter insertion, MI, COPD presenting for catheter issue.  Patient reports that 2 days ago he felt his suprapubic catheter spontaneously come out.  He is not sure how this happened.  Patient reports he has had some urine from his suprapubic site is also through his penis, but does not have much control over.  He is having some suprapubic pressure due to the urinary retention.  Patient denies any fevers, but does report that he has been "shaking" around the house.  Denies nausea or vomiting.  Past Medical History:  Diagnosis Date  . Bladder calculi   . COPD (chronic obstructive pulmonary disease) (HCC)   . DOE (dyspnea on exertion)   . Elevated blood pressure reading    12-22-2018 reprots he hasnt had his BP meds since this past thursday   . Foley catheter in place   . History of bladder stone   . 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 History of urinary retention   . Hypertension   . Mobitz type 1 second degree atrioventricular block   . Osteoarthritis   . Paroxysmal atrial tachycardia (HCC)   . Peripheral vascular disease (HCC)    last duplex 09-25-2016 bilateral ABI 0.6 and occulsion of  right popiteal posterior tibials and left peroneal  . Wears glasses     Patient Active Problem List   Diagnosis Date Noted  . Pre-operative clearance 12/15/2018  . 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 Coronary artery disease 09/05/2018  . Coronary artery disease involving native heart without angina pectoris   . NSTEMI  (non-ST elevated myocardial infarction) (HCC) 09/03/2018  . Malnutrition of moderate degree 09/03/2018  . History of loop recorder 01/23/2018  . Mobitz type 1 second degree AV block 07/30/2017  . PAD (peripheral artery disease) (HCC) 07/30/2017  . Bladder calculi 07/15/2017  . UTI (urinary tract infection) 06/13/2017  . Acute cystitis with hematuria   . Gross hematuria 06/12/2017  . Renal insufficiency 06/12/2017  . Complicated UTI (urinary tract infection) 06/12/2017  . Acute pyelonephritis 06/12/2017  . Uncomplicated alcohol dependence (HCC)   . Acute kidney injury (HCC)   . Claudication (HCC) 11/14/2016  . Encounter for loop recorder check 07/19/2015  . First degree AV block 07/19/2015  . PAT (paroxysmal atrial tachycardia) (HCC) 07/19/2015  . Essential hypertension 04/05/2015  . Tobacco use disorder 04/05/2015  . Cocaine abuse (HCC) 04/05/2015  . Hospital discharge follow-up 01/10/2015  . 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 Lung mass   . Acute embolic stroke (HCC) 01/04/2015  . Cerebral infarction due to unspecified mechanism   . Visual changes   . Substance abuse (HCC)   . Dyslipidemia, goal LDL below 70   . CVA (cerebral infarction) 01/03/2015  . Tobacco abuse 04/12/2014  . Bladder stones 04/12/2014    Past Surgical History:  Procedure Laterality Date  . CORONARY ARTERY BYPASS GRAFT N/A 09/05/2018   Procedure: CORONARY ARTERY BYPASS GRAFTING (CABG)  times 3 using left internal mammary artery and right greater saphenous vein harvested endoscopically.;  Surgeon: Ivin Poot, MD;  Location: [place];  Service: Open Heart Surgery;  Laterality: N/A;  . CYSTOSCOPY WITH LITHOLAPAXY N/A 07/15/2017   Procedure: OPEN CYSTOLITHOTOMY;  Surgeon: Lucas Mallow, MD;  Location: [facility];  Service: Urology;  Laterality: N/A;  . INGUINAL HERNIA REPAIR Right 2011  . INSERTION OF SUPRAPUBIC CATHETER N/A 07/15/2017   Procedure: INSERTION OF  SUPRAPUBIC CATHETER;  Surgeon: Lucas Mallow, MD;  Location: [facility];  Service: Urology;  Laterality: N/A;  . LEFT HEART CATH AND CORONARY ANGIOGRAPHY N/A 09/03/2018   Procedure: LEFT HEART CATH AND CORONARY ANGIOGRAPHY;  Surgeon: Wellington Hampshire, MD;  Location: [place] [facility];  Service: Cardiovascular;  Laterality: N/A;  . LOOP RECORDER IMPLANT N/A 01/05/2015   Procedure: LOOP RECORDER IMPLANT;  Surgeon: Sanda Klein, MD;  Location: [place] [facility];  Service: Cardiovascular;  Laterality: N/A;  . ORIF WRIST FRACTURE Left 03/20/2014   Procedure: OPEN REDUCTION INTERNAL FIXATION (ORIF) WRIST FRACTURE;  Surgeon: [name], MD;  Location: [place];  Service: Orthopedics;  Laterality: Left;  . TEE WITHOUT CARDIOVERSION N/A 01/05/2015   Procedure: TRANSESOPHAGEAL ECHOCARDIOGRAM (TEE)/LOOP;  Surgeon: Sanda Klein, MD;  Location: [place] [facility];  Service: Cardiovascular;  Laterality: N/A; ef 60-65%, no LA or RA thrombus, trivial PR, mild TR  . TEE WITHOUT CARDIOVERSION N/A 09/05/2018   Procedure: TRANSESOPHAGEAL ECHOCARDIOGRAM (TEE);  Surgeon: Prescott Gum, Collier Salina, MD;  Location: [place];  Service: Open Heart Surgery;  Laterality: N/A;  . TONSILLECTOMY  child  . TOTAL HIP ARTHROPLASTY Right 2000        Home Medications    Prior to Admission medications   Medication Sig Start Date End Date Taking? Authorizing Provider  aspirin EC 81 MG tablet Take 1 tablet (81 mg total) by mouth daily. 12/15/18   Erlene Quan, PA-C  atorvastatin (LIPITOR) 80 MG tablet Take 1 tablet (80 mg total) by mouth daily. 12/15/18   Erlene Quan, PA-C  carvedilol (COREG) 6.25 MG tablet Take 1 tablet (6.25 mg total) by mouth 2 (two) times daily. 12/15/18 12/15/19  Erlene Quan, PA-C  lisinopril (ZESTRIL) 2.5 MG tablet Take 1 tablet (2.5 mg total) by mouth daily. 12/15/18   Erlene Quan, PA-C    Family History Family History  Problem Relation Age of Onset  . Stroke Mother   . Stroke Sister     Social History Social  History   Tobacco Use  . Smoking status: Current Every Day Smoker    Years: 52.00    Types: Cigarettes  . Smokeless tobacco: Never Used  . Tobacco comment: 07-08-2018 pt is down to 1ppw from 1 ppd, 12-22-2018 about  half a pack per day   Substance Use Topics  . Alcohol use: Yes    Alcohol/week: 0.0 standard drinks    Comment: 08/2018 ! drink 1 pint a day 3 times a week on average  . Drug use: No    Comment: history of cocaine ; last use 7 months ago      Allergies   Lactose intolerance (gi)   Review of Systems Review of Systems  Constitutional: Positive for chills. Negative for fever.  HENT: Negative for congestion and rhinorrhea.   Respiratory: Negative for cough.   Cardiovascular: Negative for chest pain.  Gastrointestinal: Negative for abdominal pain, nausea and vomiting.  Genitourinary: Positive for decreased urine volume, difficulty urinating and dysuria. Negative for  frequency.  Musculoskeletal: Negative for myalgias.  Skin: Negative for rash.  Neurological: Positive for weakness.       +Generalized weakness  All other systems reviewed and are negative.    Physical Exam Updated Vital Signs BP 102/76   Pulse (!) 109   Temp 98.3 F (36.8 C) (Oral)   Resp 16   Ht 5\' 8"  (1.727 m)   Wt 59 kg   SpO2 97%   BMI 19.77 kg/m   Physical Exam Vitals signs and nursing note reviewed.  Constitutional:      General: He is not in acute distress.    Appearance: He is well-developed. He is not ill-appearing or diaphoretic.     Comments: Thin appearing male, poorly nourished.   HENT:     Head: Normocephalic and atraumatic.     Mouth/Throat:     Mouth: Mucous membranes are dry.  Eyes:     Conjunctiva/sclera: Conjunctivae normal.     Pupils: Pupils are equal, round, and reactive to light.  Neck:     Musculoskeletal: Normal range of motion and neck supple.  Cardiovascular:     Rate and Rhythm: Normal rate and regular rhythm.     Heart sounds: S1 normal and S2 normal.  No murmur.  Pulmonary:     Effort: Pulmonary effort is normal.     Breath sounds: Normal breath sounds. No wheezing or rales.  Abdominal:     General: There is no distension.     Palpations: Abdomen is soft.     Tenderness: There is abdominal tenderness. There is no guarding.     Comments: Suprapubic TTP without guarding or rebound.   Musculoskeletal: Normal range of motion.        General: No deformity.  Lymphadenopathy:     Cervical: No cervical adenopathy.  Skin:    General: Skin is warm and dry.     Findings: No erythema or rash.  Neurological:     Mental Status: He is alert.     Comments: Cranial nerves grossly intact. Patient moves extremities symmetrically and with good coordination.  Psychiatric:        Behavior: Behavior normal.        Thought Content: Thought content normal.        Judgment: Judgment normal.      ED Treatments / Results  Labs (all labs ordered are listed, but only abnormal results are displayed) Labs Reviewed  URINALYSIS, ROUTINE W REFLEX MICROSCOPIC - Abnormal; Notable for the following components:      Result Value   APPearance TURBID (*)    Hgb urine dipstick SMALL (*)    Ketones, ur 5 (*)    Protein, ur >=300 (*)    Leukocytes,Ua MODERATE (*)    RBC / HPF >50 (*)    WBC, UA >50 (*)    Bacteria, UA MANY (*)    All other components within normal limits  COMPREHENSIVE METABOLIC PANEL - Abnormal; Notable for the following components:   Chloride 96 (*)    Glucose, Bld 109 (*)    BUN 37 (*)    Creatinine, Ser 2.40 (*)    Total Protein 9.1 (*)    Total Bilirubin 3.3 (*)    GFR calc non Af Amer 26 (*)    GFR calc Af Amer 30 (*)    All other components within normal limits  CBC WITH DIFFERENTIAL/PLATELET - Abnormal; Notable for the following components:   WBC 31.9 (*)    MCV 106.3 (*)  MCH 36.4 (*)    Neutro Abs 28.5 (*)    Monocytes Absolute 1.9 (*)    Abs Immature Granulocytes 0.29 (*)    All other components within normal limits   LACTIC ACID, PLASMA - Abnormal; Notable for the following components:   Lactic Acid, Venous 3.4 (*)    All other components within normal limits  URINE CULTURE  CULTURE, BLOOD (ROUTINE X 2)  CULTURE, BLOOD (ROUTINE X 2)  SARS CORONAVIRUS 2 (HOSPITAL ORDER, PERFORMED IN [facility])  LACTIC ACID, PLASMA  HEPARIN LEVEL (UNFRACTIONATED)  CBC    EKG     EKG Interpretation  Date/Time:  Friday May 01 2019 15:13:01 EDT Ventricular Rate:  98 PR Interval:    QRS Duration: 129 QT Interval:  403 QTC Calculation: 515 R Axis:   72 Text Interpretation:  Atrial fibrillation Nonspecific intraventricular conduction delay Anteroseptal infarct, age indeterminate diffuse T wave inversions in precordial leads compared to previous Confirmed by Frederick Peers [phone]) on 05/01/2019 3:31:19 PM        Radiology Dg Chest 1 View  Result Date: 05/01/2019 CLINICAL DATA:  Shortness of breath and weakness EXAM: CHEST  1 VIEW COMPARISON:  October 23, 2018 FINDINGS: Lungs are hyperexpanded. There is mild scarring in the left base. There is no edema or consolidation. Heart size and pulmonary vascularity are normal. Patient is status post coronary artery bypass grafting. There is a loop recorder on the left. There is aortic atherosclerosis. No evident adenopathy. No pneumothorax. No bone lesions. IMPRESSION: Lungs hyperexpanded with mild scarring in the left base, stable. No edema or consolidation. Stable cardiac silhouette. Postoperative changes noted. Aortic Atherosclerosis (ICD10-I70.0). Electronically Signed   By: Bretta Bang III M.D.   On: 05/01/2019 15:51   Ct Renal Stone Study  Result Date: 05/01/2019 CLINICAL DATA:  Initial evaluation for hematuria, unknown cause. EXAM: CT ABDOMEN AND PELVIS WITHOUT CONTRAST TECHNIQUE: Multidetector CT imaging of the abdomen and pelvis was performed following the standard protocol without IV contrast. COMPARISON:  Prior CT from 06/11/2017. FINDINGS:  Lower chest: 15 x 9 mm nodule present at the central aspect of the right lower lobe (series 4, image 8), indeterminate. Few additional 4 mm nodules within the subpleural right lower lung (series 4, images 15, 23). Fat containing Bochdalek's type hernias noted at the lung bases bilaterally. Mild scattered subsegmental atelectasis seen dependently. Hepatobiliary: Limited noncontrast evaluation liver is unremarkable. Gallbladder within normal limits. No biliary dilatation. Pancreas: Pancreas within normal limits. Spleen: Spleen within normal limits. Adrenals/Urinary Tract: Adrenal glands are normal. Right kidney unremarkable without nephrolithiasis or hydronephrosis. No radiopaque calculi seen along the course of the right renal collecting system. No right-sided hydroureter. On the left, there is mild left hydroureter without frank hydronephrosis. Mild perinephric and periureteral fat stranding. Punctate 3 mm nonobstructive stone within the interpolar left kidney. No distally obstructive stone identified. Findings could reflect sequelae of a recently passed stone or possibly infection. Bladder decompressed with an indwelling suprapubic catheter in place. Circumferential wall thickening likely related incomplete distension and/or underlying neurogenic bladder. Scattered calcifications noted within the bladder wall. Gas lucency within the bladder lumen compatible with catheterization. Superimposed cystitis could also be considered. No definite layering stones within the decompressed bladder lumen. Stomach/Bowel: Stomach decompressed without acute abnormality. No evidence for bowel obstruction. Left inguinal hernia containing fat and loops of small bowel present without associated obstruction or inflammation. No acute inflammatory changes seen elsewhere about the bowels. Vascular/Lymphatic: Moderate aorto bi-iliac atherosclerotic disease. No aneurysm. No  appreciable adenopathy. Reproductive: Prostate enlarged measuring 5  cm in transverse diameter. Other: No free air or fluid. Musculoskeletal: No acute osseous abnormality. No discrete lytic or blastic osseous lesions. 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 Right total hip arthroplasty in place. IMPRESSION: 1. Mild left hydroureteronephrosis with mild left perinephric and periureteral fat stranding. No obstructive stone identified. Findings could reflect sequelae of a recently passed stone or possibly infection. Correlation with urinalysis recommended. 2. Punctate 3 mm nonobstructive left renal nephrolithiasis. 3. Suprapubic catheter in place with decompression of the urinary bladder. Circumferential wall thickening at least in part related to incomplete distension and/or underlying neurogenic bladder. Superimposed cystitis could also be considered. 4. Small bowel containing left inguinal hernia without associated obstruction or inflammation. 5. Moderate aorto bi-iliac atherosclerotic disease. 6. Enlarged prostate. 7. 15 x 9 mm right lower lobe nodule, indeterminate. Consider one of the following in 3 months for both low-risk and high-risk individuals: (a) repeat chest CT, () follow-up PET-CT, or () tissue sampling. This recommendation follows the consensus statement: Guidelines for Management of Incidental Pulmonary Nodules Detected on CT Images: From the Fleischner Society 2017; Radiology 2017; 284:228-243. Electronically Signed   By: Rise MuBenjamin  McClintock M.D.   On: 05/01/2019 16:29    Procedures Procedures (including critical care time)  CRITICAL CARE Performed by: Elisha PonderAlyssa B Aminat [place]   Total critical care time: 35 minutes  Critical care time was exclusive of separately billable procedures and treating other patients.  Critical care was necessary to treat or prevent imminent or life-threatening deterioration.  Critical care was time spent personally by me on the following activities: development of treatment plan with patient and/or  surrogate as well as nursing, discussions with consultants, evaluation of patient's response to treatment, examination of patient, obtaining history from patient or surrogate, ordering and performing treatments and interventions, ordering and review of laboratory studies, ordering and review of radiographic studies, pulse oximetry and re-evaluation of patient's condition.   Medications Ordered in ED Medications  sodium chloride 0.9 % bolus 500 mL (has no administration in time range)     Initial Impression / Assessment and Plan / ED Course  I have reviewed the triage vital signs and the nursing notes.  Pertinent labs & imaging results that were available during my care of the patient were reviewed by me and considered in my medical decision making (see chart for details).  Clinical Course as of May 01 1507  Fri May 01, 2019  1424 Temp(!): 100.7 F (38.2 C) [AM]  1500 Creatinine(!): 2.40 [AM]  1504 Lactic Acid, Venous(!!): 3.4 [AM]  1504 WBC(!): 31.9 [AM]    Clinical Course User Index [AM] Elisha PonderMurray, Shakesha Soltau B, PA-C       This is a 73 year old male with past medical history of suprapubic catheter secondary to bladder stones presenting for suprapubic catheter dysfunction as well as generalized weakness.  He was found to be febrile per rectal temp.  He is soft blood pressure of 102/76 and pulse rate of 109.   Suprapubic catheter was replaced here in the emergency department.  Frank pus was expressed.  Patient was started on Zosyn for presumptive sepsis secondary to urinary tract infection.  Patient has a leukocytosis of 31.9.  He has a AKI with creatinine of 2.49 today.  BUN 37.  Urinalysis demonstrating evidence of infection.  Given the degree of blood in patient's urine, and history of bladder stones, will obtain CT renal stone study.  Patient also found to be in new  onset A. fib. Elevated HR likely compensatory in the setting of sepsis.  Sepsis - Repeat Assessment  Performed  at:      Vitals     Blood pressure 102/76, pulse (!) 109, temperature (!) 100.7 F (38.2 C), temperature source Rectal, resp. rate 16, height 5\' 8"  (1.727 m), weight 59 kg, SpO2 97 %.  Heart:     Tachycardic, irregular  Lungs:    CTA  Capillary Refill:   <2 sec  Peripheral Pulse:   Radial pulse palpable  Skin:     Normal Color  This is a shared visit with Dr. Frederick Peersachel Little. Patient was independently evaluated by this attending physician. Attending physician consulted in evaluation and management.  Patient to be admitted per Dr.Pahwani. Appreciate her involvement.   Final Clinical Impressions(s) / ED Diagnoses   Final diagnoses:  Acute cystitis with hematuria  Sepsis, due to unspecified organism, unspecified whether acute organ dysfunction present [facility](HCC)    ED Discharge Orders    None       Delia ChimesMurray, Lucion Dilger B, PA-C 05/01/19 1742    Little, Ambrose Finlandachel Morgan, MD 05/02/19 [phone]

## 2019-05-02 ENCOUNTER — Encounter (HOSPITAL_COMMUNITY): Payer: Self-pay | Admitting: *Deleted

## 2019-05-02 ENCOUNTER — Other Ambulatory Visit: Payer: Self-pay

## 2019-05-02 ENCOUNTER — Inpatient Hospital Stay (HOSPITAL_COMMUNITY): Payer: Medicare Other

## 2019-05-02 DIAGNOSIS — A419 Sepsis, unspecified organism: Secondary | ICD-10-CM

## 2019-05-02 DIAGNOSIS — I361 Nonrheumatic tricuspid (valve) insufficiency: Secondary | ICD-10-CM

## 2019-05-02 DIAGNOSIS — R7881 Bacteremia: Secondary | ICD-10-CM | POA: Diagnosis present

## 2019-05-02 DIAGNOSIS — N179 Acute kidney failure, unspecified: Secondary | ICD-10-CM

## 2019-05-02 DIAGNOSIS — I10 Essential (primary) hypertension: Secondary | ICD-10-CM

## 2019-05-02 DIAGNOSIS — I25119 Atherosclerotic heart disease of native coronary artery with unspecified angina pectoris: Secondary | ICD-10-CM

## 2019-05-02 DIAGNOSIS — N210 Calculus in bladder: Secondary | ICD-10-CM

## 2019-05-02 DIAGNOSIS — N390 Urinary tract infection, site not specified: Secondary | ICD-10-CM

## 2019-05-02 DIAGNOSIS — Z951 Presence of aortocoronary bypass graft: Secondary | ICD-10-CM

## 2019-05-02 DIAGNOSIS — I4891 Unspecified atrial fibrillation: Secondary | ICD-10-CM

## 2019-05-02 LAB — CBC
HCT: 37.1 % — ABNORMAL LOW (ref 39.0–52.0)
Hemoglobin: 12.1 g/dL — ABNORMAL LOW (ref 13.0–17.0)
MCH: 35.4 pg — ABNORMAL HIGH (ref 26.0–34.0)
MCHC: 32.6 g/dL (ref 30.0–36.0)
MCV: 108.5 fL — ABNORMAL HIGH (ref 80.0–100.0)
Platelets: 186 10*3/uL (ref 150–400)
RBC: 3.42 MIL/uL — ABNORMAL LOW (ref 4.22–5.81)
RDW: 14.8 % (ref 11.5–15.5)
WBC: 26.4 10*3/uL — ABNORMAL HIGH (ref 4.0–10.5)
nRBC: 0 % (ref 0.0–0.2)

## 2019-05-02 LAB — TSH: TSH: 0.714 u[IU]/mL (ref 0.350–4.500)

## 2019-05-02 LAB — COMPREHENSIVE METABOLIC PANEL
ALT: 13 U/L (ref 0–44)
AST: 18 U/L (ref 15–41)
Albumin: 2.8 g/dL — ABNORMAL LOW (ref 3.5–5.0)
Alkaline Phosphatase: 44 U/L (ref 38–126)
Anion gap: 9 (ref 5–15)
BUN: 31 mg/dL — ABNORMAL HIGH (ref 8–23)
CO2: 22 mmol/L (ref 22–32)
Calcium: 8.3 mg/dL — ABNORMAL LOW (ref 8.9–10.3)
Chloride: 105 mmol/L (ref 98–111)
Creatinine, Ser: 1.6 mg/dL — ABNORMAL HIGH (ref 0.61–1.24)
GFR calc Af Amer: 49 mL/min — ABNORMAL LOW (ref >60)
GFR calc non Af Amer: 43 mL/min — ABNORMAL LOW (ref >60)
Glucose, Bld: 99 mg/dL (ref 70–99)
Potassium: 3.9 mmol/L (ref 3.5–5.1)
Sodium: 136 mmol/L (ref 135–145)
Total Bilirubin: 1.8 mg/dL — ABNORMAL HIGH (ref 0.3–1.2)
Total Protein: 6.3 g/dL — ABNORMAL LOW (ref 6.5–8.1)

## 2019-05-02 LAB — PROCALCITONIN: Procalcitonin: 9.33 ng/mL

## 2019-05-02 LAB — BLOOD CULTURE ID PANEL (REFLEXED)

## 2019-05-02 LAB — ECHOCARDIOGRAM COMPLETE
Height: 68 in
Weight: 1841.28 oz

## 2019-05-02 LAB — HEPARIN LEVEL (UNFRACTIONATED)
Heparin Unfractionated: 0.24 IU/mL — ABNORMAL LOW (ref 0.30–0.70)
Heparin Unfractionated: 0.24 IU/mL — ABNORMAL LOW (ref 0.30–0.70)

## 2019-05-02 LAB — MAGNESIUM: Magnesium: 2 mg/dL (ref 1.7–2.4)

## 2019-05-02 LAB — LACTIC ACID, PLASMA: Lactic Acid, Venous: 2.4 mmol/L (ref 0.5–1.9)

## 2019-05-02 LAB — PROTIME-INR
INR: 1.5 — ABNORMAL HIGH (ref 0.8–1.2)
Prothrombin Time: 18 seconds — ABNORMAL HIGH (ref 11.4–15.2)

## 2019-05-02 LAB — CORTISOL-AM, BLOOD: Cortisol - AM: 25.4 ug/dL — ABNORMAL HIGH (ref 6.7–22.6)

## 2019-05-02 MED ORDER — AMIODARONE LOAD VIA INFUSION
150.0000 mg | Freq: Once | INTRAVENOUS | Status: AC
  Administered 2019-05-02: 150 mg via INTRAVENOUS
  Filled 2019-05-02: qty 83.34

## 2019-05-02 MED ORDER — AMIODARONE HCL IN DEXTROSE 360-4.14 MG/200ML-% IV SOLN
30.0000 mg/h | INTRAVENOUS | Status: DC
  Administered 2019-05-02 – 2019-05-04 (×4): 30 mg/h via INTRAVENOUS
  Filled 2019-05-02 (×4): qty 200

## 2019-05-02 MED ORDER — HEPARIN (PORCINE) 25000 UT/250ML-% IV SOLN
1050.0000 [IU]/h | INTRAVENOUS | Status: DC
  Administered 2019-05-02: 1050 [IU]/h via INTRAVENOUS
  Filled 2019-05-02: qty 250

## 2019-05-02 MED ORDER — SODIUM CHLORIDE 0.9 % IV SOLN
2.0000 g | Freq: Every day | INTRAVENOUS | Status: DC
  Administered 2019-05-02 – 2019-05-04 (×3): 2 g via INTRAVENOUS
  Filled 2019-05-02 (×3): qty 2

## 2019-05-02 MED ORDER — SODIUM CHLORIDE 0.9% FLUSH: 10.0000 mL | INTRAVENOUS | Status: DC | PRN

## 2019-05-02 MED ORDER — HEPARIN (PORCINE) 25000 UT/250ML-% IV SOLN
1200.0000 [IU]/h | INTRAVENOUS | Status: DC
  Administered 2019-05-02: 1200 [IU]/h via INTRAVENOUS
  Filled 2019-05-02 (×2): qty 250

## 2019-05-02 MED ORDER — AMIODARONE HCL IN DEXTROSE 360-4.14 MG/200ML-% IV SOLN
60.0000 mg/h | INTRAVENOUS | Status: AC
  Administered 2019-05-02 (×2): 60 mg/h via INTRAVENOUS
  Filled 2019-05-02 (×2): qty 200

## 2019-05-02 NOTE — Progress Notes (Signed)
After starting ordered IV amiodarone patient's heart rate sustaining mostly in the 50s, and 60s-70s,  back and forth between SB and A-fib. patient asystematic Dr. Meda Coffee notified and she stated to continue Amiodarone and notify cardiology if sustaining in the 36s. Will continue to assess patient.

## 2019-05-02 NOTE — Progress Notes (Signed)
[place] for heparin Indication: new onset atrial fibrillation  Allergies  Allergen Reactions  . Lactose Intolerance (Gi) Diarrhea    Patient Measurements: Height: 5\' 8"  (172.7 cm) Weight: 115 lb 1.3 oz (52.2 kg) IBW/kg (Calculated) : 68.4 Heparin Dosing Weight: 59 kg  Vital Signs: Temp: 98 F (36.7 C) (06/27 0900) Temp Source: Oral (06/27 0900) BP: 111/54 (06/27 1115) Pulse Rate: 50 (06/27 1047)  Labs: Recent Labs    05/01/19 1400 05/02/19 0136 05/02/19 1149  HGB 15.5 12.1*  --   HCT 45.3 37.1*  --   PLT 220 186  --   LABPROT  --  18.0*  --   INR  --  1.5*  --   HEPARINUNFRC  --  0.24* 0.24*  CREATININE 2.40* 1.60*  --     Estimated Creatinine Clearance: 31.3 mL/min (A) (by C-G formula based on SCr of 1.6 mg/dL (H)).   Assessment: Patient's a 73 y.o M with hx recurrent renal stones and hematuria and CABG presented to the ED on 6/26 with dislodged suprapubic cath and c/o abd pain. He was found to have new onset afib. To start heparin drip for afib.   Baseline INR slightly elevated  Prior anticoagulation: none  Significant events:  Today, 05/02/2019:  CBC: Hgb down after admission, suspect hemodilution  Most recent heparin level remains SUBtherapeutic and unchanged despite rate increase to 1050 units/hr  No bleeding or infusion issues per nursing  Goal of Therapy: Heparin level 0.3-0.7 units/ml Monitor platelets by anticoagulation protocol: Yes  Plan:  Increase heparin IV infusion to 1200 units/hr  Check heparin level 8 hrs after rate change  Daily CBC, daily heparin level once stable  Monitor for signs of bleeding or thrombosis   Reuel Boom, PharmD, BCPS [phone] 05/02/2019, 12:40 PM

## 2019-05-02 NOTE — ED Provider Notes (Signed)
.  BLADDER CATHETERIZATION  Date/Time: 05/02/2019 7:48 AM Performed by: Sharlett Iles, MD Authorized by: Sharlett Iles, MD   Consent:    Consent obtained:  Verbal   Consent given by:  Patient Pre-procedure details:    Procedure purpose:  Therapeutic   Preparation: Patient was prepped and draped in usual sterile fashion   Anesthesia (see MAR for exact dosages):    Anesthesia method:  None Procedure details:    Catheter insertion:  Indwelling   Catheter type:  Foley   Catheter size:  14 Fr   Bladder irrigation: no     Number of attempts:  1   Urine characteristics:  Cloudy and foul smelling Post-procedure details:    Patient tolerance of procedure:  Tolerated well, no immediate complications Comments:     14Fr catheter placed through suprapubic stoma, no complications      Jonny Longino, Wenda Overland, MD 05/02/19 [phone]

## 2019-05-02 NOTE — Progress Notes (Signed)
CRITICAL VALUE ALERT  Critical Value: Lactic Acid: 2.4  Date & Time Notied: 05/02/2019 @ 2257  Provider Notified: X. Blount,NP  Orders Received/Actions taken: no new orders at this time.

## 2019-05-02 NOTE — Progress Notes (Signed)
PHARMACY - PHYSICIAN COMMUNICATION CRITICAL VALUE ALERT - BLOOD CULTURE IDENTIFICATION (BCID)  Joe Durham is an 73 y.o. male who presented to [facility] on 05/01/2019 with a chief complaint of fevers and dislodged suprapubic cath  Assessment:  Proteus bacteremia (suspect urinary source given dislodged chronic suprapubic cath, urinary retention, and Hx renal calculi)  Name of physician (or Provider) Contacted: [place] Silos  Current antibiotics: Rocephin 1g IV daily  Changes to prescribed antibiotics recommended: Increase Rocephin to 2g IV daily, will give dose now. Recommendations accepted by provider    Results for orders placed or performed during the hospital encounter of 05/01/19  Blood Culture ID Panel (Reflexed) (Collected: 05/01/2019  2:37 PM)  Result Value Ref Range   Enterococcus species NOT DETECTED NOT DETECTED   Listeria monocytogenes NOT DETECTED NOT DETECTED   Staphylococcus species NOT DETECTED NOT DETECTED   Staphylococcus aureus (BCID) NOT DETECTED NOT DETECTED   Streptococcus species NOT DETECTED NOT DETECTED   Streptococcus agalactiae NOT DETECTED NOT DETECTED   Streptococcus pneumoniae NOT DETECTED NOT DETECTED   Streptococcus pyogenes NOT DETECTED NOT DETECTED   Acinetobacter baumannii NOT DETECTED NOT DETECTED   Enterobacteriaceae species DETECTED (A) NOT DETECTED   Enterobacter cloacae complex NOT DETECTED NOT DETECTED   Escherichia coli NOT DETECTED NOT DETECTED   Klebsiella oxytoca NOT DETECTED NOT DETECTED   Klebsiella pneumoniae NOT DETECTED NOT DETECTED   Proteus species DETECTED (A) NOT DETECTED   Serratia marcescens NOT DETECTED NOT DETECTED   Carbapenem resistance NOT DETECTED NOT DETECTED   Haemophilus influenzae NOT DETECTED NOT DETECTED   Neisseria meningitidis NOT DETECTED NOT DETECTED   Pseudomonas aeruginosa NOT DETECTED NOT DETECTED   Candida albicans NOT DETECTED NOT DETECTED   Candida glabrata NOT DETECTED NOT DETECTED   Candida krusei  NOT DETECTED NOT DETECTED   Candida parapsilosis NOT DETECTED NOT DETECTED   Candida tropicalis NOT DETECTED NOT DETECTED    Hason Ofarrell A 05/02/2019  4:28 PM

## 2019-05-02 NOTE — Consult Note (Signed)
Cardiology Consultation:   Patient ID: Torell Minder MRN: [id number]; DOB: [birth year]  Admit date: 05/01/2019 Date of Consult: 05/02/2019  Primary Care Provider: Patient, No Pcp Per Primary Cardiologist: Thurmon Fair, MD   Patient Profile:   Ewart Carrera is a 72 y.o. male with a hx of PAT, HTN, HLP who is being seen today for the evaluation of new onset atrial fibrillation at the request of Hughie Closs, MD.  History of Present Illness:   Mr. Abdon is a 73 year old male with h/o PAT, prior right posterior cerebral artery embolism stroke in 2016, known peripheral vascular disease and lower extremities, hypertension, dyslipidemia, and smoking.  He is also had transient Mobitz 1 AV block.  He presented to [facility] in October 2019 with unstable angina.  Catheterization was done and he subsequently underwent bypass surgery x3 with an LIMA to LAD, SVG to OM, SVG to RCA.  Ultimately he recovered well.  He was seen in the office in November for follow-up was doing well at that time. He was last seen in the clinic for a preoperative clearance prior to TURP. He has h/o recurrent renal stones and suprapubic catheter in place.   He was admitted with fever, abdominal pressure and dislodging of suprapubic catheter. He has also been having some shortness of breath with exertion.  He was found to be in atrial fibrillation with RVR   Labs showed Crea 2.4 from baseline 0.9, leukocytosis of more than 31, bilirubin of 3.3.  Urine analysis was positive for bacteria, white cells and leukoesterase.  He was diagnosed with sepsis due to UTI.  He was given Zosyn.    He denies any chest pain or SOB, complains of pain int his RLE at night and early in the mornings.  Heart Pathway Score:     Past Medical History:  Diagnosis Date  . Bladder calculi   . COPD (chronic obstructive pulmonary disease) (HCC)   . DOE (dyspnea on exertion)   . Elevated blood pressure reading    12-22-2018 reprots he hasnt had  his BP meds since this past thursday   . Foley catheter in place   . History of bladder stone   . 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 History of urinary retention   . Hypertension   . Mobitz type 1 second degree atrioventricular block   . Osteoarthritis   . Paroxysmal atrial tachycardia (HCC)   . Peripheral vascular disease (HCC)    last duplex 09-25-2016 bilateral ABI 0.6 and occulsion of  right popiteal posterior tibials and left peroneal  . Wears glasses     Past Surgical History:  Procedure Laterality Date  . CORONARY ARTERY BYPASS GRAFT N/A 09/05/2018   Procedure: CORONARY ARTERY BYPASS GRAFTING (CABG) times 3 using left internal mammary artery and right greater saphenous vein harvested endoscopically.;  Surgeon: Kerin Perna, MD;  Location: [facility] OR;  Service: Open Heart Surgery;  Laterality: N/A;  . CYSTOSCOPY WITH LITHOLAPAXY N/A 07/15/2017   Procedure: OPEN CYSTOLITHOTOMY;  Surgeon: Crista Elliot, MD;  Location: [facility] ORS;  Service: Urology;  Laterality: N/A;  . INGUINAL HERNIA REPAIR Right 2011  . INSERTION OF SUPRAPUBIC CATHETER N/A 07/15/2017   Procedure: INSERTION OF SUPRAPUBIC CATHETER;  Surgeon: Crista Elliot, MD;  Location: [facility] ORS;  Service: Urology;  Laterality: N/A;  . LEFT HEART CATH AND CORONARY ANGIOGRAPHY N/A 09/03/2018  Procedure: LEFT HEART CATH AND CORONARY ANGIOGRAPHY;  Surgeon: Iran OuchArida, Muhammad A, MD;  Location: [facility] INVASIVE CV LAB;  Service: Cardiovascular;  Laterality: N/A;  . LOOP RECORDER IMPLANT N/A 01/05/2015   Procedure: LOOP RECORDER IMPLANT;  Surgeon: Thurmon FairMihai Croitoru, MD;  Location: [facility] CATH LAB;  Service: Cardiovascular;  Laterality: N/A;  . ORIF WRIST FRACTURE Left 03/20/2014   Procedure: OPEN REDUCTION INTERNAL FIXATION (ORIF) WRIST FRACTURE;  Surgeon: Dominica SeverinWilliam Gramig, MD;  Location: [facility] OR;  Service: Orthopedics;  Laterality: Left;  .  TEE WITHOUT CARDIOVERSION N/A 01/05/2015   Procedure: TRANSESOPHAGEAL ECHOCARDIOGRAM (TEE)/LOOP;  Surgeon: Thurmon FairMihai Croitoru, MD;  Location: [facility] ENDOSCOPY;  Service: Cardiovascular;  Laterality: N/A; ef 60-65%, no LA or RA thrombus, trivial PR, mild TR  . TEE WITHOUT CARDIOVERSION N/A 09/05/2018   Procedure: TRANSESOPHAGEAL ECHOCARDIOGRAM (TEE);  Surgeon: Donata ClayVan Trigt, Theron AristaPeter, MD;  [facility];  Service: Open Heart Surgery;  Laterality: N/A;  . TONSILLECTOMY  child  . TOTAL HIP ARTHROPLASTY Right 2000    Inpatient Medications: Scheduled Meds: . aspirin EC  81 mg Oral Daily  . atorvastatin  80 mg Oral Daily  . carvedilol  6.25 mg Oral BID  . sodium chloride flush  3 mL Intravenous Q12H   Continuous Infusions: . sodium chloride 125 mL/hr at 05/02/19 0700  . cefTRIAXone (ROCEPHIN)  IV Stopped (05/01/19 2244)  . heparin 1,050 Units/hr (05/02/19 0700)   PRN Meds: acetaminophen **OR** acetaminophen, hydrALAZINE, HYDROcodone-acetaminophen, ondansetron **OR** ondansetron (ZOFRAN) IV  Allergies:    Allergies  Allergen Reactions  . Lactose Intolerance (Gi) Diarrhea    Social History:   Social History   Socioeconomic History  . Marital status: Single    Spouse name: Not on file  . Number of children: 4  . Years of education: 9  . Highest education level: Not on file  Occupational History  . Occupation: retired    Comment: Training and development officerconstruction  Social Needs  . Financial resource strain: Not on file  . Food insecurity    Worry: Not on file    Inability: Not on file  . Transportation needs    Medical: Not on file    Non-medical: Not on file  Tobacco Use  . Smoking status: Current Every Day Smoker    Years: 52.00    Types: Cigarettes  . Smokeless tobacco: Never Used  . Tobacco comment: 07-08-2018 pt is down to 1ppw from 1 ppd, 12-22-2018 about  half a pack per day   Substance and Sexual Activity  . Alcohol use: Yes    Alcohol/week: 0.0 standard drinks    Comment: 08/2018 ! drink 1 pint a  day 3 times a week on average  . Drug use: No    Comment: history of cocaine ; last use 7 months ago   . Sexual activity: Not on file  Lifestyle  . Physical activity    Days per week: Not on file    Minutes per session: Not on file  . Stress: Not on file  Relationships  . Social Musicianconnections    Talks on phone: Not on file    Gets together: Not on file    Attends religious service: Not on file    Active member of club or organization: Not on file    Attends meetings of clubs or organizations: Not on file    Relationship status: Not on file  . Intimate partner violence    Fear of current or ex partner: Not on file    Emotionally abused: Not on  file    Physically abused: Not on file    Forced sexual activity: Not on file  Other Topics Concern  . Not on file  Social History Narrative   Single   Right handed   Caffeine use - sodas on weekends with alcohol    Family History:    Family History  Problem Relation Age of Onset  . Stroke Mother   . Stroke Sister      ROS:  Please see the history of present illness.  All other ROS reviewed and negative.     Physical Exam/Data:   Vitals:   05/01/19 1906 05/01/19 1950 05/01/19 2120 05/02/19 0451  BP: 103/62  122/86 136/82  Pulse: (!) 105 88 87 (!) 107  Resp: 18 20 20 18   Temp: 99 F (37.2 C)  100.1 F (37.8 C) 99.6 F (37.6 C)  TempSrc: Oral  Oral Oral  SpO2:   100% (!) 56%  Weight:    52.2 kg  Height:        Intake/Output Summary (Last 24 hours) at 05/02/2019 0924 Last data filed at 05/02/2019 0700 Gross per 24 hour  Intake 2595.48 ml  Output 950 ml  Net 1645.48 ml   Last 3 Weights 05/02/2019 05/01/2019 12/22/2018  Weight (lbs) 115 lb 1.3 oz 130 lb 123 lb 12.8 oz  Weight (kg) 52.2 kg 58.968 kg 56.155 kg     Body mass index is 17.5 kg/m.  General:  Well nourished, well developed, in no acute distress HEENT: normal Lymph: no adenopathy Neck: no JVD Endocrine:  No thryomegaly Vascular: No carotid bruits; LE  pulses weak bilaterally  Cardiac:  normal S1, S2; iRRR; no murmur  Lungs:  clear to auscultation bilaterally, no wheezing, rhonchi or rales  Abd: soft, nontender, no hepatomegaly  Ext: no edema Musculoskeletal:  No deformities, BUE and BLE strength normal and equal Skin: warm and dry  Neuro:  CNs 2-12 intact, no focal abnormalities noted Psych:  Normal affect   EKG:  The EKG was personally reviewed and demonstrates:  Atrial fibrillation , new negative T waves in the precordial leads - V2-4 Telemetry:  Telemetry was personally reviewed and demonstrates:  Atrial fibrillation with ventricular rates 65-120 BPM  Relevant CV Studies: - Left ventricle: The cavity size was normal. Wall thickness was   increased in a pattern of mild LVH. There was focal basal   hypertrophy. Systolic function was normal. The estimated ejection   fraction was in the range of 60% to 65%. Wall motion was normal;   there were no regional wall motion abnormalities. The study is   not technically sufficient to allow evaluation of LV diastolic   function. - Mitral valve: There was mild regurgitation. - Right ventricle: Systolic function was mildly to moderately   reduced. - Pulmonary arteries: Systolic pressure was mildly increased. PA   peak pressure: 32 mm Hg (S).  Laboratory Data:  High Sensitivity Troponin:  No results for input(s): TROPONINIHS in the last 720 hours.   Cardiac EnzymesNo results for input(s): TROPONINI in the last 168 hours. No results for input(s): TROPIPOC in the last 168 hours.  Chemistry Recent Labs  Lab 05/01/19 1400 05/02/19 0136  NA 135 136  K 4.2 3.9  CL 96* 105  CO2 25 22  GLUCOSE 109* 99  BUN 37* 31*  CREATININE 2.40* 1.60*  CALCIUM 9.8 8.3*  GFRNONAA 26* 43*  GFRAA 30* 49*  ANIONGAP 14 9    Recent Labs  Lab 05/01/19 1400 05/02/19  0136  PROT 9.1* 6.3*  ALBUMIN 4.2 2.8*  AST 26 18  ALT 16 13  ALKPHOS 70 44  BILITOT 3.3* 1.8*   Hematology Recent Labs  Lab  05/01/19 1400 05/02/19 0136  WBC 31.9* 26.4*  RBC 4.26 3.42*  HGB 15.5 12.1*  HCT 45.3 37.1*  MCV 106.3* 108.5*  MCH 36.4* 35.4*  MCHC 34.2 32.6  RDW 14.8 14.8  PLT 220 186   BNPNo results for input(s): BNP, PROBNP in the last 168 hours.  DDimer No results for input(s): DDIMER in the last 168 hours.   Radiology/Studies:  Dg Chest 1 View  Result Date: 05/01/2019 CLINICAL DATA:  Shortness of breath and weakness EXAM: CHEST  1 VIEW COMPARISON:  October 23, 2018 FINDINGS: Lungs are hyperexpanded. There is mild scarring in the left base. There is no edema or consolidation. Heart size and pulmonary vascularity are normal. Patient is status post coronary artery bypass grafting. There is a loop recorder on the left. There is aortic atherosclerosis. No evident adenopathy. No pneumothorax. No bone lesions. IMPRESSION: Lungs hyperexpanded with mild scarring in the left base, stable. No edema or consolidation. Stable cardiac silhouette. Postoperative changes noted. Aortic Atherosclerosis (ICD10-I70.0). Electronically Signed   By: Bretta BangWilliam  Woodruff III M.D.   On: 05/01/2019 15:51   Ct Renal Stone Study  Result Date: 05/01/2019 CLINICAL DATA:  Initial evaluation for hematuria, unknown cause. EXAM: CT ABDOMEN AND PELVIS WITHOUT CONTRAST TECHNIQUE: Multidetector CT imaging of the abdomen and pelvis was performed following the standard protocol without IV contrast. COMPARISON:  Prior CT from 06/11/2017. FINDINGS: Lower chest: 15 x 9 mm nodule present at the central aspect of the right lower lobe (series 4, image 8), indeterminate. Few additional 4 mm nodules within the subpleural right lower lung (series 4, images 15, 23). Fat containing Bochdalek's type hernias noted at the lung bases bilaterally. Mild scattered subsegmental atelectasis seen dependently. Hepatobiliary: Limited noncontrast evaluation liver is unremarkable. Gallbladder within normal limits. No biliary dilatation. Pancreas: Pancreas within  normal limits. Spleen: Spleen within normal limits. Adrenals/Urinary Tract: Adrenal glands are normal. Right kidney unremarkable without nephrolithiasis or hydronephrosis. No radiopaque calculi seen along the course of the right renal collecting system. No right-sided hydroureter. On the left, there is mild left hydroureter without frank hydronephrosis. Mild perinephric and periureteral fat stranding. Punctate 3 mm nonobstructive stone within the interpolar left kidney. No distally obstructive stone identified. Findings could reflect sequelae of a recently passed stone or possibly infection. Bladder decompressed with an indwelling suprapubic catheter in place. Circumferential wall thickening likely related incomplete distension and/or underlying neurogenic bladder. Scattered calcifications noted within the bladder wall. Gas lucency within the bladder lumen compatible with catheterization. Superimposed cystitis could also be considered. No definite layering stones within the decompressed bladder lumen. Stomach/Bowel: Stomach decompressed without acute abnormality. No evidence for bowel obstruction. Left inguinal hernia containing fat and loops of small bowel present without associated obstruction or inflammation. No acute inflammatory changes seen elsewhere about the bowels. Vascular/Lymphatic: Moderate aorto bi-iliac atherosclerotic disease. No aneurysm. No appreciable adenopathy. Reproductive: Prostate enlarged measuring 5 cm in transverse diameter. Other: No free air or fluid. Musculoskeletal: No acute osseous abnormality. No discrete lytic or blastic osseous lesions. 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 Right total hip arthroplasty in place. IMPRESSION: 1. Mild left hydroureteronephrosis with mild left perinephric and periureteral fat stranding. No obstructive stone identified. Findings could reflect sequelae of a recently passed stone or possibly infection. Correlation with  urinalysis recommended. 2.  Punctate 3 mm nonobstructive left renal nephrolithiasis. 3. Suprapubic catheter in place with decompression of the urinary bladder. Circumferential wall thickening at least in part related to incomplete distension and/or underlying neurogenic bladder. Superimposed cystitis could also be considered. 4. Small bowel containing left inguinal hernia without associated obstruction or inflammation. 5. Moderate aorto bi-iliac atherosclerotic disease. 6. Enlarged prostate. 7. 15 x 9 mm right lower lobe nodule, indeterminate. Consider one of the following in 3 months for both low-risk and high-risk individuals: (a) repeat chest CT, () follow-up PET-CT, or () tissue sampling. This recommendation follows the consensus statement: Guidelines for Management of Incidental Pulmonary Nodules Detected on CT Images: From the Fleischner Society 2017; Radiology 2017; 284:228-243. Electronically Signed   By: Jeannine Boga M.D.   On: 05/01/2019 16:29    Assessment and Plan:   1. New onset PAF 1. continue heparin drip, CHADs-VASc 4 , prior stroke, he will require long term anticoagulation 2. continue carvedilol 6.25 mg po BID 3. Add amiodarone drip  2. PAD (peripheral artery disease) (HCC) ABIs 0.6 in 2017, now symptomatic, we will refer to vascular after disccharge  3. 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 Tobacco abuse 1/2 PPD  5. Dyslipidemia, goal LDL below 70 Continue atorvastatin  For questions or updates, please contact [place] Please consult [email or web address] for contact info under   Signed, Ena Dawley, MD  05/02/2019 9:24 AM

## 2019-05-02 NOTE — Progress Notes (Signed)
  Echocardiogram 2D Echocardiogram has been performed.  Lakoda Mcanany L Androw 05/02/2019, 2:18 PM

## 2019-05-02 NOTE — Progress Notes (Signed)
[place] for heparin Indication: new onset atrial fibrillation  Allergies  Allergen Reactions  . Lactose Intolerance (Gi) Diarrhea    Patient Measurements: Height: 5\' 8"  (172.7 cm) Weight: 130 lb (59 kg) IBW/kg (Calculated) : 68.4 Heparin Dosing Weight: 59 kg  Vital Signs: Temp: 100.1 F (37.8 C) (06/26 2120) Temp Source: Oral (06/26 2120) BP: 122/86 (06/26 2120) Pulse Rate: 87 (06/26 2120)  Labs: Recent Labs    05/01/19 1400 05/02/19 0136  HGB 15.5 12.1*  HCT 45.3 37.1*  PLT 220 186  HEPARINUNFRC  --  0.24*  CREATININE 2.40*  --     Estimated Creatinine Clearance: 23.6 mL/min (A) (by C-G formula based on SCr of 2.4 mg/dL (H)).   Assessment: Patient's a 73 y.o M with hx recurrent renal stones and hematuria and CABG presented to the ED on 6/26 with dislodged suprapubic cath and c/o abd pain.  He was found to have new onset afib.  To start heparin drip for afib. Today, 6/27 0220 HL = 0.24 below goal, no infusion issues or bleeding per RN. H/H 12.1/37.1, Plts = 186  Goal of Therapy:  Heparin level 0.3-0.7 units/ml Monitor platelets by anticoagulation protocol: Yes   Plan:  -increase heparin drip to 1050 units/hr - check 8 hr heparin level - monitor for s/s bleeding  Lawana Pai R 05/02/2019,2:30 AM

## 2019-05-02 NOTE — Progress Notes (Signed)
PROGRESS NOTE    Joe KaufmanGeronimo Durham  ZOX:[id number] DOB: 05/19/1946 DOA: 05/01/2019 PCP: Patient, No Pcp Per    Brief Narrative:  HPI per Dr Joe GoodyPahwani Macoy Durham is a 73 y.o. male with medical history significant of recurrent renal stones and hematuria, CAD status post CABG, hypertension and hyperlipidemia, PAT, COPD presented to ER with a complaint of abdominal pressure and dislodging of suprapubic catheter.  According to patient, his suprapubic catheter fell off suddenly 2 days ago.  Since then he has been having suprapubic pressure/pain which has gotten significantly worse over the past 24 hours.  He has also been having some shortness of breath with exertion.  Denies any other complaints such as cough, fever, diarrhea, nausea, vomiting, dizziness, headache or any other complaint.  No sick contacts and no recent travel.  He has noticed some dribbling of urine from suprapubic catheter insertion site and from penis.  ED Course: In the ED, he had low-grade fever of 100.7 with some tachycardia up to 106.  CBC showed severe leukocytosis of more than 31 and CMP showed creatinine of 2.4 and bilirubin of 3.3.  Urine analysis was positive for bacteria, white cells and leukoesterase.  He was diagnosed with sepsis due to UTI.  He was given Zosyn.  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  Patient currently afebrile.  Leukocytosis trending down.  Lactic acid trending down.  Blood cultures with 1/4 positive for Proteus.  Urinalysis concerning for UTI.  Urine cultures pending.  CT renal stone protocol done was concerning for pyelonephritis and did show left hydroureteronephrosis with mild left perinephric and periureteral fat stranding.  Cultures pending and sensitivities.  IV Rocephin dose has been increased to 2 g daily per pharmacy.  Will monitor for now.  2.  New onset A. Fib CHADSVASC score 4 Patient noted to be a new onset A. fib on admission.  Likely secondary to problem #1.  TSH at 0.714.  2D echo with a EF of 55 to 60%, no wall motion abnormalities.  Patient seen in consultation by cardiology.  And patient started on amiodarone drip in addition to Coreg 6.25 twice daily for rate control.  Patient noted to have prior history of CVA and per cardiology will likely require long-term anticoagulation.  Continue heparin for now.  Cardiology following and appreciate input and recommendations.  3.  Peripheral vascular disease Patient with complaints of right calf and right ankle pain ongoing for a while.  ABI of 0.6 in 2017.  Continue statin.  Tobacco cessation stressed to patient.  Patient on anticoagulation with heparin.  Outpatient referral to vascular surgery.  4.  Tobacco abuse Tobacco cessation.  Placed on nicotine patch.  Follow.  5.  Hyperlipidemia Continue statin.  7.  Coronary artery disease status post CABG November 2019 Continue statin, aspirin, Coreg.  Cardiology following.  8.  Acute renal failure Likely secondary to post renal azotemia secondary to obstruction and sepsis.  Suprapubic catheter fell out 2 days prior to admission and patient with complaints of abdominal pain and  distention.  Suprapubic catheter placed stent with good urine output.  CT stone protocol concerning for pyelonephritis and left hydroureteronephrosis with mild left perinephric and periurethral fat stranding.  Foley catheter has decompressed bladder.  Patient with a urine output of 950 cc since admission.  And down.  Creatinine at 1.6 from 2.40 on admission.  Continue gentle hydration with IV fluids.  Follow.  9.  Acute urinary retention/dislodgment of suprapubic catheter Suprapubic catheter reinserted in the ED.  Good urine output.  Monitor renal function.  Follow.  10.  History of embolic CVA Patient on anticoagulation with heparin secondary to new onset A. fib.  Continue statin.  Continue aspirin.   DVT prophylaxis: SCDs Code Status: Full Family Communication: Updated patient.  No family at bedside. Disposition Plan: To be determined   Consultants:   Cardiology: Dr. Delton See 05/02/2019  Procedures:   CT renal stone protocol 05/01/2019  Chest x-ray 05/01/2019  2D echo 05/02/2019  Antimicrobials:   IV Rocephin 05/01/2019   Subjective: Patient complaining of right calf and right lower extremity pain.  Patient states abdominal pain improved since admission and replacement of suprapubic Foley catheter.  Patient denies any chest pain.  No shortness of breath.  Feels somewhat better than on admission.  Objective: Vitals:   05/02/19 1400 05/02/19 1500 05/02/19 1630 05/02/19 1700  BP: 112/63 109/68 (!) 109/56 (!) 121/56  Pulse: 61 70 (!) 56   Resp: 16     Temp: 99.5 F (37.5 C)     TempSrc:      SpO2: 98%     Weight:      Height:        Intake/Output Summary (Last 24 hours) at 05/02/2019 1837 Last data filed at 05/02/2019 1438 Gross per 24 hour  Intake 2835.48 ml  Output 1350 ml  Net 1485.48 ml   Filed Weights   05/01/19 1308 05/02/19 0451  Weight: 59 kg 52.2 kg    Examination:  General exam: Appears calm and comfortable  Respiratory system: Clear to auscultation.  Respiratory effort normal. Cardiovascular system: S1 & S2 heard, RRR. No JVD, murmurs, rubs, gallops or clicks. No pedal edema. Gastrointestinal system: Abdomen is nondistended, soft and nontender. No organomegaly or masses felt. Normal bowel sounds heard.  Suprapubic catheter in place. Central nervous system: Alert and oriented. No focal neurological deficits. Extremities: Symmetric 5 x 5 power. Skin: No rashes, lesions or ulcers Psychiatry: Judgement and insight appear normal. Mood & affect appropriate.     Data Reviewed: I have personally reviewed following labs and imaging studies  CBC: Recent Labs  Lab 05/01/19 1400 05/02/19 0136  WBC 31.9* 26.4*  NEUTROABS 28.5*  --   HGB 15.5 12.1*  HCT 45.3 37.1*  MCV 106.3* 108.5*  PLT 220 186   Basic Metabolic Panel: Recent Labs  Lab 05/01/19 1400 05/02/19 0136  NA 135 136  K 4.2 3.9  CL 96* 105  CO2 25 22  GLUCOSE 109* 99  BUN 37* 31*  CREATININE 2.40* 1.60*  CALCIUM 9.8 8.3*  MG  --  2.0   GFR: Estimated Creatinine Clearance: 31.3 mL/min (A) (by C-G formula based on SCr of 1.6  mg/dL (H)). Liver Function Tests: Recent Labs  Lab 05/01/19 1400 05/02/19 0136  AST 26 18  ALT 16 13  ALKPHOS 70 44  BILITOT 3.3* 1.8*  PROT 9.1* 6.3*  ALBUMIN 4.2 2.8*   No results for input(s): LIPASE, AMYLASE in the last 168 hours. No results for input(s): AMMONIA in the last 168 hours. Coagulation Profile: Recent Labs  Lab 05/02/19 0136  INR 1.5*   Cardiac Enzymes: No results for input(s): CKTOTAL, CKMB, CKMBINDEX, TROPONINI in the last 168 hours. BNP (last 3 results) No results for input(s): PROBNP in the last 8760 hours. HbA1C: No results for input(s): HGBA1C in the last 72 hours. CBG: No results for input(s): GLUCAP in the last 168 hours. Lipid Profile: No results for input(s): CHOL, HDL, LDLCALC, TRIG, CHOLHDL, LDLDIRECT in the last 72 hours. Thyroid Function Tests: Recent Labs    05/02/19 1149  TSH 0.714   Anemia  Panel: No results for input(s): VITAMINB12, FOLATE, FERRITIN, TIBC, IRON, RETICCTPCT in the last 72 hours. Sepsis Labs: Recent Labs  Lab 05/01/19 1418 05/01/19 1645 05/02/19 0136  PROCALCITON  --   --  9.33  LATICACIDVEN 3.4* 4.8* 2.4*    Recent Results (from the past 240 hour(s))  Blood culture (routine x 2)     Status: Abnormal (Preliminary result)   Collection Time: 05/01/19  2:37 PM   Specimen: BLOOD RIGHT ARM  Result Value Ref Range Status   Specimen Description   Final    BLOOD RIGHT ARM Performed at [facility] [address] [place], [place] [address]    Special Requests   Final    BOTTLES DRAWN AEROBIC AND ANAEROBIC Blood Culture adequate volume Performed at [facility] [address], [place], [place] [address]    Culture  Setup Time (A)  Final    GRAM VARIABLE ROD AEROBIC BOTTLE ONLY CRITICAL RESULT CALLED TO, READ BACK BY AND VERIFIED WITH: [place] [id number] FCP Performed at [facility], [place] [address] [place], [place] [address]    Culture GRAM NEGATIVE RODS  Final   Report Status PENDING  Incomplete  Blood Culture ID Panel (Reflexed)     Status: Abnormal   Collection Time: 05/01/19  2:37 PM  Result Value Ref Range Status   Enterococcus species NOT DETECTED NOT DETECTED Final   Listeria monocytogenes NOT DETECTED NOT DETECTED Final   Staphylococcus species NOT DETECTED NOT DETECTED Final   Staphylococcus aureus (BCID) NOT DETECTED NOT DETECTED Final   Streptococcus species NOT DETECTED NOT DETECTED Final   Streptococcus agalactiae NOT DETECTED NOT DETECTED Final   Streptococcus pneumoniae NOT DETECTED NOT DETECTED Final   Streptococcus pyogenes NOT DETECTED NOT DETECTED Final   Acinetobacter baumannii NOT DETECTED NOT DETECTED Final   Enterobacteriaceae species DETECTED (A) NOT DETECTED Final    Comment: Enterobacteriaceae represent a large family of gram-negative bacteria, not a single organism. CRITICAL RESULT  CALLED TO, READ BACK BY AND VERIFIED WITH: PHARMD WALFORD [phone] FCP    Enterobacter cloacae complex NOT DETECTED NOT DETECTED Final   Escherichia coli NOT DETECTED NOT DETECTED Final   Klebsiella oxytoca NOT DETECTED NOT DETECTED Final   Klebsiella pneumoniae NOT DETECTED NOT DETECTED Final   Proteus species DETECTED (A) NOT DETECTED Final    Comment: CRITICAL RESULT CALLED TO, READ BACK BY AND VERIFIED WITH: PHARMD WALFORD [id number] FCP    Serratia marcescens NOT DETECTED NOT DETECTED Final   Carbapenem resistance NOT DETECTED NOT DETECTED Final  Haemophilus influenzae NOT DETECTED NOT DETECTED Final   Neisseria meningitidis NOT DETECTED NOT DETECTED Final   Pseudomonas aeruginosa NOT DETECTED NOT DETECTED Final   Candida albicans NOT DETECTED NOT DETECTED Final   Candida glabrata NOT DETECTED NOT DETECTED Final   Candida krusei NOT DETECTED NOT DETECTED Final   Candida parapsilosis NOT DETECTED NOT DETECTED Final   Candida tropicalis NOT DETECTED NOT DETECTED Final    Comment: Performed at [facility], [address] [place], [place] [address]  Blood culture (routine x 2)     Status: None (Preliminary result)   Collection Time: 05/01/19  3:15 PM   Specimen: BLOOD  Result Value Ref Range Status   Specimen Description   Final    BLOOD LEFT ANTECUBITAL Performed at [facility], [address] [place], [place] [address]    Special Requests   Final    BOTTLES DRAWN AEROBIC AND ANAEROBIC Blood Culture adequate volume Performed at [facility], [address] [place], [place] [address]    Culture   Final    NO GROWTH < 24 HOURS Performed at [facility], [address] [place], [place] [address]    Report Status PENDING  Incomplete  SARS Coronavirus 2 (CEPHEID- Performed in [facility] hospital lab), Hosp Order     Status: None   Collection Time: 05/01/19  4:55 PM   Specimen: Nasopharyngeal Swab  Result Value Ref  Range Status   SARS Coronavirus 2 NEGATIVE NEGATIVE Final    Comment: (NOTE) If result is NEGATIVE SARS-CoV-2 target nucleic acids are NOT DETECTED. The SARS-CoV-2 RNA is generally detectable in upper and lower  respiratory specimens during the acute phase of infection. The lowest  concentration of SARS-CoV-2 viral copies this assay can detect is 250  copies / mL. A negative result does not preclude SARS-CoV-2 infection  and should not be used as the sole basis for treatment or other  patient management decisions.  A negative result may occur with  improper specimen collection / handling, submission of specimen other  than nasopharyngeal swab, presence of viral mutation(s) within the  areas targeted by this assay, and inadequate number of viral copies  (<250 copies / mL). A negative result must be combined with clinical  observations, patient history, and epidemiological information. If result is POSITIVE SARS-CoV-2 target nucleic acids are DETECTED. The SARS-CoV-2 RNA is generally detectable in upper and lower  respiratory specimens dur ing the acute phase of infection.  Positive  results are indicative of active infection with SARS-CoV-2.  Clinical  correlation with patient history and other diagnostic information is  necessary to determine patient infection status.  Positive results do  not rule out bacterial infection or co-infection with other viruses. If result is PRESUMPTIVE POSTIVE SARS-CoV-2 nucleic acids MAY BE PRESENT.   A presumptive positive result was obtained on the submitted specimen  and confirmed on repeat testing.  While 2019 novel coronavirus  (SARS-CoV-2) nucleic acids may be present in the submitted sample  additional confirmatory testing may be necessary for epidemiological  and / or clinical management purposes  to differentiate between  SARS-CoV-2 and other Sarbecovirus currently known to infect humans.  If clinically indicated additional testing with an  alternate test  methodology [phone]([id number]) is advised. The SARS-CoV-2 RNA is generally  detectable in upper and lower respiratory sp ecimens during the acute  phase of infection. The expected result is Negative. Fact Sheet for Patients:  BoilerBrush.com.cyhttps://www.fda.gov/media/136312/download Fact Sheet for  Healthcare Providers: https://pope.com/https://www.fda.gov/media/136313/download This test is not yet approved or cleared by the [place] FDA and has been authorized for detection and/or diagnosis of SARS-CoV-2 by FDA under an Emergency Use Authorization (EUA).  This EUA will remain in effect (meaning this test can be used) for the duration of the COVID-19 declaration under Section 564(b)(1) of the Act, 21 U.S.C. section 360bbb-3(b)(1), unless the authorization is terminated or revoked sooner. Performed at [facility], [address] [place], [place] [address]          Radiology Studies: Dg Chest 1 View  Result Date: 05/01/2019 CLINICAL DATA:  Shortness of breath and weakness EXAM: CHEST  1 VIEW COMPARISON:  October 23, 2018 FINDINGS: Lungs are hyperexpanded. There is mild scarring in the left base. There is no edema or consolidation. Heart size and pulmonary vascularity are normal. Patient is status post coronary artery bypass grafting. There is a loop recorder on the left. There is aortic atherosclerosis. No evident adenopathy. No pneumothorax. No bone lesions. IMPRESSION: Lungs hyperexpanded with mild scarring in the left base, stable. No edema or consolidation. Stable cardiac silhouette. Postoperative changes noted. Aortic Atherosclerosis (ICD10-I70.0). Electronically Signed   By: Bretta BangWilliam  Woodruff III M.D.   On: 05/01/2019 15:51   Ct Renal Stone Study  Result Date: 05/01/2019 CLINICAL DATA:  Initial evaluation for hematuria, unknown cause. EXAM: CT ABDOMEN AND PELVIS WITHOUT CONTRAST TECHNIQUE: Multidetector CT imaging of the abdomen and pelvis was performed following the standard  protocol without IV contrast. COMPARISON:  Prior CT from 06/11/2017. FINDINGS: Lower chest: 15 x 9 mm nodule present at the central aspect of the right lower lobe (series 4, image 8), indeterminate. Few additional 4 mm nodules within the subpleural right lower lung (series 4, images 15, 23). Fat containing Bochdalek's type hernias noted at the lung bases bilaterally. Mild scattered subsegmental atelectasis seen dependently. Hepatobiliary: Limited noncontrast evaluation liver is unremarkable. Gallbladder within normal limits. No biliary dilatation. Pancreas: Pancreas within normal limits. Spleen: Spleen within normal limits. Adrenals/Urinary Tract: Adrenal glands are normal. Right kidney unremarkable without nephrolithiasis or hydronephrosis. No radiopaque calculi seen along the course of the right renal collecting system. No right-sided hydroureter. On the left, there is mild left hydroureter without frank hydronephrosis. Mild perinephric and periureteral fat stranding. Punctate 3 mm nonobstructive stone within the interpolar left kidney. No distally obstructive stone identified. Findings could reflect sequelae of a recently passed stone or possibly infection. Bladder decompressed with an indwelling suprapubic catheter in place. Circumferential wall thickening likely related incomplete distension and/or underlying neurogenic bladder. Scattered calcifications noted within the bladder wall. Gas lucency within the bladder lumen compatible with catheterization. Superimposed cystitis could also be considered. No definite layering stones within the decompressed bladder lumen. Stomach/Bowel: Stomach decompressed without acute abnormality. No evidence for bowel obstruction. Left inguinal hernia containing fat and loops of small bowel present without associated obstruction or inflammation. No acute inflammatory changes seen elsewhere about the bowels. Vascular/Lymphatic: Moderate aorto bi-iliac atherosclerotic disease. No  aneurysm. No appreciable adenopathy. Reproductive: Prostate enlarged measuring 5 cm in transverse diameter. Other: No free air or fluid. Musculoskeletal: No acute osseous abnormality. No discrete lytic or blastic osseous lesions. 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 Right total hip arthroplasty in place. IMPRESSION: 1. Mild left hydroureteronephrosis with mild left perinephric and periureteral fat stranding. No obstructive stone identified. Findings could reflect sequelae of a recently passed stone or possibly infection. Correlation with urinalysis recommended. 2. Punctate 3 mm nonobstructive left renal  nephrolithiasis. 3. Suprapubic catheter in place with decompression of the urinary bladder. Circumferential wall thickening at least in part related to incomplete distension and/or underlying neurogenic bladder. Superimposed cystitis could also be considered. 4. Small bowel containing left inguinal hernia without associated obstruction or inflammation. 5. Moderate aorto bi-iliac atherosclerotic disease. 6. Enlarged prostate. 7. 15 x 9 mm right lower lobe nodule, indeterminate. Consider one of the following in 3 months for both low-risk and high-risk individuals: (a) repeat chest CT, () follow-up PET-CT, or () tissue sampling. This recommendation follows the consensus statement: Guidelines for Management of Incidental Pulmonary Nodules Detected on CT Images: From the Fleischner Society 2017; Radiology 2017; 284:228-243. Electronically Signed   By: Rise Mu M.D.   On: 05/01/2019 16:29        Scheduled Meds: . aspirin EC  81 mg Oral Daily  . atorvastatin  80 mg Oral Daily  . carvedilol  6.25 mg Oral BID  . sodium chloride flush  3 mL Intravenous Q12H   Continuous Infusions: . sodium chloride 100 mL/hr at 05/02/19 1148  . amiodarone 30 mg/hr (05/02/19 1654)  . cefTRIAXone (ROCEPHIN)  IV 2 g (05/02/19 1720)  . heparin 1,200 Units/hr (05/02/19 1313)     LOS:  1 day    Time spent: 40 minutes    Ramiro Harvest, MD [facility]  If 7PM-7AM, please contact night-coverage [email or web address] 05/02/2019, 6:37 PM

## 2019-05-02 NOTE — Plan of Care (Signed)
Plan of care reviewed and discussed with the patient. 

## 2019-05-03 DIAGNOSIS — R7881 Bacteremia: Secondary | ICD-10-CM

## 2019-05-03 LAB — BASIC METABOLIC PANEL
Anion gap: 8 (ref 5–15)
BUN: 21 mg/dL (ref 8–23)
CO2: 20 mmol/L — ABNORMAL LOW (ref 22–32)
Calcium: 7.9 mg/dL — ABNORMAL LOW (ref 8.9–10.3)
Chloride: 107 mmol/L (ref 98–111)
Creatinine, Ser: 1.24 mg/dL (ref 0.61–1.24)
GFR calc Af Amer: >60 mL/min (ref >60)
GFR calc non Af Amer: 58 mL/min — ABNORMAL LOW (ref >60)
Glucose, Bld: 114 mg/dL — ABNORMAL HIGH (ref 70–99)
Potassium: 3.3 mmol/L — ABNORMAL LOW (ref 3.5–5.1)
Sodium: 135 mmol/L (ref 135–145)

## 2019-05-03 LAB — CBC WITH DIFFERENTIAL/PLATELET
Abs Immature Granulocytes: 0.2 10*3/uL — ABNORMAL HIGH (ref 0.00–0.07)
Basophils Absolute: 0 10*3/uL (ref 0.0–0.1)
Basophils Relative: 0 %
Eosinophils Absolute: 0 10*3/uL (ref 0.0–0.5)
Eosinophils Relative: 0 %
HCT: 30.4 % — ABNORMAL LOW (ref 39.0–52.0)
Hemoglobin: 10 g/dL — ABNORMAL LOW (ref 13.0–17.0)
Immature Granulocytes: 1 %
Lymphocytes Relative: 6 %
Lymphs Abs: 1.1 10*3/uL (ref 0.7–4.0)
MCH: 36 pg — ABNORMAL HIGH (ref 26.0–34.0)
MCHC: 32.9 g/dL (ref 30.0–36.0)
MCV: 109.4 fL — ABNORMAL HIGH (ref 80.0–100.0)
Monocytes Absolute: 1.4 10*3/uL — ABNORMAL HIGH (ref 0.1–1.0)
Monocytes Relative: 8 %
Neutro Abs: 15.2 10*3/uL — ABNORMAL HIGH (ref 1.7–7.7)
Neutrophils Relative %: 85 %
Platelets: 145 10*3/uL — ABNORMAL LOW (ref 150–400)
RBC: 2.78 MIL/uL — ABNORMAL LOW (ref 4.22–5.81)
RDW: 14.7 % (ref 11.5–15.5)
WBC: 18 10*3/uL — ABNORMAL HIGH (ref 4.0–10.5)
nRBC: 0 % (ref 0.0–0.2)

## 2019-05-03 LAB — HEPARIN LEVEL (UNFRACTIONATED)
Heparin Unfractionated: 0.5 IU/mL (ref 0.30–0.70)
Heparin Unfractionated: 0.27 IU/mL — ABNORMAL LOW (ref 0.30–0.70)
Heparin Unfractionated: 0.38 IU/mL (ref 0.30–0.70)

## 2019-05-03 LAB — LACTIC ACID, PLASMA: Lactic Acid, Venous: 1.6 mmol/L (ref 0.5–1.9)

## 2019-05-03 LAB — URINE CULTURE

## 2019-05-03 LAB — PROCALCITONIN: Procalcitonin: 9.31 ng/mL

## 2019-05-03 MED ORDER — POTASSIUM CHLORIDE CRYS ER 20 MEQ PO TBCR
40.0000 meq | EXTENDED_RELEASE_TABLET | Freq: Once | ORAL | Status: AC
  Administered 2019-05-03: 40 meq via ORAL
  Filled 2019-05-03: qty 2

## 2019-05-03 MED ORDER — CARVEDILOL 3.125 MG PO TABS
3.1250 mg | ORAL_TABLET | Freq: Two times a day (BID) | ORAL | Status: DC
  Administered 2019-05-03 – 2019-05-05 (×4): 3.125 mg via ORAL
  Filled 2019-05-03 (×4): qty 1

## 2019-05-03 MED ORDER — HEPARIN (PORCINE) 25000 UT/250ML-% IV SOLN
1300.0000 [IU]/h | INTRAVENOUS | Status: DC
  Administered 2019-05-03 (×2): 1300 [IU]/h via INTRAVENOUS
  Filled 2019-05-03: qty 250

## 2019-05-03 NOTE — Progress Notes (Signed)
[place] for heparin Indication: new onset atrial fibrillation  Allergies  Allergen Reactions  . Lactose Intolerance (Gi) Diarrhea    Patient Measurements: Height: 5\' 8"  (172.7 cm) Weight: 115 lb 1.3 oz (52.2 kg) IBW/kg (Calculated) : 68.4 Heparin Dosing Weight: 59 kg  Vital Signs: Temp: 97.5 F (36.4 C) (06/28 1227) Temp Source: Oral (06/28 1227) BP: 122/76 (06/28 1227) Pulse Rate: 56 (06/28 1227)  Labs: Recent Labs    05/01/19 1400  05/02/19 0136 05/02/19 1149 05/03/19 0137 05/03/19 1100  HGB 15.5  --  12.1*  --  10.0*  --   HCT 45.3  --  37.1*  --  30.4*  --   PLT 220  --  186  --  145*  --   LABPROT  --   --  18.0*  --   --   --   INR  --   --  1.5*  --   --   --   HEPARINUNFRC  --    < > 0.24* 0.24* 0.50 0.27*  CREATININE 2.40*  --  1.60*  --  1.24  --    < > = values in this interval not displayed.    Estimated Creatinine Clearance: 40.3 mL/min (by C-G formula based on SCr of 1.24 mg/dL).   Assessment: Patient's a 73 y.o M with hx recurrent renal stones and hematuria and CABG presented to the ED on 6/26 with dislodged suprapubic cath and c/o abd pain. He was found to have new onset afib. To start heparin drip for afib.   Baseline INR slightly elevated  Prior anticoagulation: none  Significant events:  Today, 05/03/2019:  CBC: Hgb down after admission, suspect hemodilution  Most recent heparin level again SUBtherapeutic after rising to 0.5 previously; RN reports no issues with site, line, or pump that would result in falsely low levels  No hematuria or other bleeding per RN  Goal of Therapy: Heparin level 0.3-0.7 units/ml Monitor platelets by anticoagulation protocol: Yes  Plan:  Increase heparin IV infusion to 1300 units/hr  Check heparin level 8 hrs after rate change  Daily CBC, daily heparin level once stable  Monitor for signs of bleeding or thrombosis   Reuel Boom, PharmD,  BCPS [phone] 05/03/2019, 12:30 PM

## 2019-05-03 NOTE — Progress Notes (Signed)
Progress Note  Patient Name: Joe KaufmanGeronimo Durham Date of Encounter: 05/03/2019  Primary Cardiologist: Thurmon FairMihai Croitoru, MD   Subjective   He complains of right ankle pain, no palpitations or dizziness.  Inpatient Medications    Scheduled Meds: . aspirin EC  81 mg Oral Daily  . atorvastatin  80 mg Oral Daily  . carvedilol  6.25 mg Oral BID  . potassium chloride  40 mEq Oral Once  . sodium chloride flush  3 mL Intravenous Q12H   Continuous Infusions: . sodium chloride 100 mL/hr at 05/02/19 2302  . amiodarone 30 mg/hr (05/03/19 0713)  . cefTRIAXone (ROCEPHIN)  IV 2 g (05/02/19 1720)  . heparin 1,200 Units/hr (05/02/19 1313)   PRN Meds: acetaminophen **OR** acetaminophen, hydrALAZINE, HYDROcodone-acetaminophen, ondansetron **OR** ondansetron (ZOFRAN) IV, sodium chloride flush   Vital Signs    Vitals:   05/02/19 1630 05/02/19 1700 05/02/19 2106 05/03/19 0422  BP: (!) 109/56 (!) 121/56 137/76 118/82  Pulse: (!) 56  70 65  Resp:   18 18  Temp:   98.7 F (37.1 C) 98.8 F (37.1 C)  TempSrc:   Oral Oral  SpO2:   99% 98%  Weight:      Height:        Intake/Output Summary (Last 24 hours) at 05/03/2019 0955 Last data filed at 05/03/2019 0531 Gross per 24 hour  Intake 3240.49 ml  Output 1700 ml  Net 1540.49 ml   Last 3 Weights 05/02/2019 05/01/2019 12/22/2018  Weight (lbs) 115 lb 1.3 oz 130 lb 123 lb 12.8 oz  Weight (kg) 52.2 kg 58.968 kg 56.155 kg     Telemetry    Atrial fibrillation with slow ventricular response - Personally Reviewed  ECG    No new tracing - Personally Reviewed  Physical Exam   GEN: No acute distress.   Neck: No JVD Cardiac: RRR, no murmurs, rubs, or gallops.  Respiratory: Clear to auscultation bilaterally. GI: Soft, nontender, non-distended  MS: No edema; No deformity. Neuro:  Nonfocal  Psych: Normal affect   Labs    High Sensitivity Troponin:  No results for input(s): TROPONINIHS in the last 720 hours.    Cardiac EnzymesNo results for  input(s): TROPONINI in the last 168 hours. No results for input(s): TROPIPOC in the last 168 hours.   Chemistry Recent Labs  Lab 05/01/19 1400 05/02/19 0136 05/03/19 0137  NA 135 136 135  K 4.2 3.9 3.3*  CL 96* 105 107  CO2 25 22 20*  GLUCOSE 109* 99 114*  BUN 37* 31* 21  CREATININE 2.40* 1.60* 1.24  CALCIUM 9.8 8.3* 7.9*  PROT 9.1* 6.3*  --   ALBUMIN 4.2 2.8*  --   AST 26 18  --   ALT 16 13  --   ALKPHOS 70 44  --   BILITOT 3.3* 1.8*  --   GFRNONAA 26* 43* 58*  GFRAA 30* 49* >60  ANIONGAP 14 9 8     Hematology Recent Labs  Lab 05/01/19 1400 05/02/19 0136 05/03/19 0137  WBC 31.9* 26.4* 18.0*  RBC 4.26 3.42* 2.78*  HGB 15.5 12.1* 10.0*  HCT 45.3 37.1* 30.4*  MCV 106.3* 108.5* 109.4*  MCH 36.4* 35.4* 36.0*  MCHC 34.2 32.6 32.9  RDW 14.8 14.8 14.7  PLT 220 186 145*   BNPNo results for input(s): BNP, PROBNP in the last 168 hours.   DDimer No results for input(s): DDIMER in the last 168 hours.   Radiology    Dg Chest 1 View  Result Date:  05/01/2019 CLINICAL DATA:  Shortness of breath and weakness EXAM: CHEST  1 VIEW COMPARISON:  October 23, 2018 FINDINGS: Lungs are hyperexpanded. There is mild scarring in the left base. There is no edema or consolidation. Heart size and pulmonary vascularity are normal. Patient is status post coronary artery bypass grafting. There is a loop recorder on the left. There is aortic atherosclerosis. No evident adenopathy. No pneumothorax. No bone lesions. IMPRESSION: Lungs hyperexpanded with mild scarring in the left base, stable. No edema or consolidation. Stable cardiac silhouette. Postoperative changes noted. Aortic Atherosclerosis (ICD10-I70.0). Electronically Signed   By: Bretta Bang III M.D.   On: 05/01/2019 15:51   Ct Renal Stone Study  Result Date: 05/01/2019 CLINICAL DATA:  Initial evaluation for hematuria, unknown cause. EXAM: CT ABDOMEN AND PELVIS WITHOUT CONTRAST TECHNIQUE: Multidetector CT imaging of the abdomen and  pelvis was performed following the standard protocol without IV contrast. COMPARISON:  Prior CT from 06/11/2017. FINDINGS: Lower chest: 15 x 9 mm nodule present at the central aspect of the right lower lobe (series 4, image 8), indeterminate. Few additional 4 mm nodules within the subpleural right lower lung (series 4, images 15, 23). Fat containing Bochdalek's type hernias noted at the lung bases bilaterally. Mild scattered subsegmental atelectasis seen dependently. Hepatobiliary: Limited noncontrast evaluation liver is unremarkable. Gallbladder within normal limits. No biliary dilatation. Pancreas: Pancreas within normal limits. Spleen: Spleen within normal limits. Adrenals/Urinary Tract: Adrenal glands are normal. Right kidney unremarkable without nephrolithiasis or hydronephrosis. No radiopaque calculi seen along the course of the right renal collecting system. No right-sided hydroureter. On the left, there is mild left hydroureter without frank hydronephrosis. Mild perinephric and periureteral fat stranding. Punctate 3 mm nonobstructive stone within the interpolar left kidney. No distally obstructive stone identified. Findings could reflect sequelae of a recently passed stone or possibly infection. Bladder decompressed with an indwelling suprapubic catheter in place. Circumferential wall thickening likely related incomplete distension and/or underlying neurogenic bladder. Scattered calcifications noted within the bladder wall. Gas lucency within the bladder lumen compatible with catheterization. Superimposed cystitis could also be considered. No definite layering stones within the decompressed bladder lumen. Stomach/Bowel: Stomach decompressed without acute abnormality. No evidence for bowel obstruction. Left inguinal hernia containing fat and loops of small bowel present without associated obstruction or inflammation. No acute inflammatory changes seen elsewhere about the bowels. Vascular/Lymphatic: Moderate  aorto bi-iliac atherosclerotic disease. No aneurysm. No appreciable adenopathy. Reproductive: Prostate enlarged measuring 5 cm in transverse diameter. Other: No free air or fluid. Musculoskeletal: No acute osseous abnormality. No discrete lytic or blastic osseous lesions. 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 Right total hip arthroplasty in place. IMPRESSION: 1. Mild left hydroureteronephrosis with mild left perinephric and periureteral fat stranding. No obstructive stone identified. Findings could reflect sequelae of a recently passed stone or possibly infection. Correlation with urinalysis recommended. 2. Punctate 3 mm nonobstructive left renal nephrolithiasis. 3. Suprapubic catheter in place with decompression of the urinary bladder. Circumferential wall thickening at least in part related to incomplete distension and/or underlying neurogenic bladder. Superimposed cystitis could also be considered. 4. Small bowel containing left inguinal hernia without associated obstruction or inflammation. 5. Moderate aorto bi-iliac atherosclerotic disease. 6. Enlarged prostate. 7. 15 x 9 mm right lower lobe nodule, indeterminate. Consider one of the following in 3 months for both low-risk and high-risk individuals: (a) repeat chest CT, () follow-up PET-CT, or () tissue sampling. This recommendation follows the consensus statement: Guidelines for Management of Incidental Pulmonary Nodules Detected  on CT Images: From the Fleischner Society 2017; Radiology 2017; [phone]. Electronically Signed   By: Jeannine Boga M.D.   On: 05/01/2019 16:29    Cardiac Studies   TTE: 05/02/2019  1. The left ventricle has normal systolic function, with an ejection fraction of 55-60%. The cavity size was normal. Left ventricular diastolic Doppler parameters are consistent with pseudonormalization. No evidence of left ventricular regional wall  motion abnormalities.  2. The right ventricle has normal  systolic function. The cavity was normal. There is no increase in right ventricular wall thickness.  3. Left atrial size was mildly dilated.  4. Right atrial size was mildly dilated.  5. No evidence of mitral valve stenosis. Trivial mitral regurgitation.  6. The aortic valve is tricuspid. Mild calcification of the aortic valve. No stenosis of the aortic valve.  7. The aortic root is normal in size and structure.  8. Normal IVC size with PA systolic pressure 36 mmHg.   Patient Profile     73 y.o. male   Assessment & Plan   1. New onset PAF 1. continue heparin drip, CHADs-VASc 4 , prior stroke, he will require long term anticoagulation 2. Decrease carvedilol to 3.125 mg PO BID 3. continue amiodarone drip 4. The goal for now is to restore SR as he has normal LVEF and only mildly dilated left atrium  2. PAD (peripheral artery disease) (HCC) ABIs 0.6 in 2017, now symptomatic, we will refer to vascular after discharge  3. 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 Tobacco abuse 1/2 PPD  5. Dyslipidemia, goal LDL below 70 Continue atorvastatin  For questions or updates, please contact [place] Please consult [email or web address] for contact info under     Signed, Ena Dawley, MD  05/03/2019, 9:55 AM

## 2019-05-03 NOTE — Progress Notes (Addendum)
PROGRESS NOTE    Joe Durham  QVZ:[id number] DOB: [birth year] DOA: 05/01/2019 PCP: Patient, No Pcp Per    Brief Narrative:  HPI per Dr Elie Goody is a 73 y.o. male with medical history significant of recurrent renal stones and hematuria, CAD status post CABG, hypertension and hyperlipidemia, PAT, COPD presented to ER with a complaint of abdominal pressure and dislodging of suprapubic catheter.  According to patient, his suprapubic catheter fell off suddenly 2 days ago.  Since then he has been having suprapubic pressure/pain which has gotten significantly worse over the past 24 hours.  He has also been having some shortness of breath with exertion.  Denies any other complaints such as cough, fever, diarrhea, nausea, vomiting, dizziness, headache or any other complaint.  No sick contacts and no recent travel.  He has noticed some dribbling of urine from suprapubic catheter insertion site and from penis.  ED Course: In the ED, he had low-grade fever of 100.7 with some tachycardia up to 106.  CBC showed severe leukocytosis of more than 31 and CMP showed creatinine of 2.4 and bilirubin of 3.3.  Urine analysis was positive for bacteria, white cells and leukoesterase.  He was diagnosed with sepsis due to UTI.  He was given Zosyn.  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  Patient currently afebrile.  Leukocytosis trending down.  Lactic acid trending down.  Blood cultures with 1/4 positive for Proteus Mirabilis.  Urinalysis concerning for UTI.  Urine cultures negative to date.  Repeat urine cultures.  Patient with bilateral CVA tenderness to palpation.  CT renal stone protocol done was concerning for pyelonephritis and did show left hydroureteronephrosis with mild left perinephric and periureteral fat stranding.  Cultures pending and sensitivities.  IV Rocephin dose has been increased to 2 g daily per pharmacy.  Will monitor for now.  2.  New onset A. Fib CHADSVASC score 4 Patient noted to be a new onset A. fib on admission.  Likely secondary to problem #1.  TSH at 0.714.  2D echo with a EF of 55 to 60%, no wall motion abnormalities.  Patient seen in consultation by cardiology.  And patient started on amiodarone drip in addition to Coreg twice daily for rate control.  Coreg dose decreased per cardiology and ordered to restore sinus rhythm as is at goal per cardiology.  Patient noted to have prior history of CVA and per cardiology will likely require long-term anticoagulation.  Continue heparin for now.  Cardiology following and appreciate input and recommendations.  3.  Peripheral vascular disease Patient with complaints of right calf and right ankle pain ongoing for a while.  ABI of 0.6 in 2017.  Continue statin.  Tobacco cessation stressed to patient.  Patient on anticoagulation with heparin.  Outpatient referral to vascular surgery.  4.  Tobacco abuse Tobacco cessation.  Continue nicotine patch. Follow.  5.  Hyperlipidemia Continue statin.  7.  Coronary artery disease status post CABG November 2019 Stable.  Continue statin, aspirin, Coreg.  Cardiology following.  8.  Acute  renal failure Likely secondary to post renal azotemia secondary to obstruction and sepsis.  Suprapubic catheter fell out 2 days prior to admission and patient with complaints of abdominal pain and distention.  Suprapubic catheter placed stent with good urine output.  CT stone protocol concerning for pyelonephritis and left hydroureteronephrosis with mild left perinephric and periurethral fat stranding.  Foley catheter has decompressed bladder.  Patient with a urine output of 1.7 L over the past 24 hours.  Renal function trending down creatinine currently at 1.24 from 1.6 from 2.40 on admission.  Decrease IV fluids to 75 cc an hour.  Follow.   9.  Acute urinary retention/dislodgment of suprapubic catheter Suprapubic catheter reinserted in the ED.  Good urine output of 1.7 L over the past 24 hours.  Monitor renal function.  Follow.  10.  History of embolic CVA Patient on anticoagulation with heparin secondary to new onset A. fib.  Continue statin.  Continue aspirin.  11.  Left inguinal hernia Patient noted to have left inguinal hernia descended into his left scrotal sac.  Patient states ongoing for the past 2 weeks with worsening when standing up coughing and also when trying to ambulate and reduces when laying down.  Per RN patient having bowel movements with no nausea or vomiting and no signs of obstruction at this time.  Will have general surgery assessed patient tomorrow 05/04/2019.  12.  Hypokalemia K. Dur 40 mEq p.o. x1.   DVT prophylaxis: Heparin Code Status: Full Family Communication: Updated patient.  No family at bedside. Disposition Plan: To be determined   Consultants:   Cardiology: Dr. Meda Coffee 05/02/2019  Procedures:   CT renal stone protocol 05/01/2019  Chest x-ray 05/01/2019  2D echo 05/02/2019  Antimicrobials:   IV Rocephin 05/01/2019   Subjective: Patient complaining of 2-week history of left inguinal hernia which he states worsens when he coughs or stands up to  ambulate but reduces when laying down or flat.  Patient denies any shortness of breath.  Patient denies any chest pain.  Patient states abdominal pain improved since admission.   Objective: Vitals:   05/02/19 1630 05/02/19 1700 05/02/19 2106 05/03/19 0422  BP: (!) 109/56 (!) 121/56 137/76 118/82  Pulse: (!) 56  70 65  Resp:   18 18  Temp:   98.7 F (37.1 C) 98.8 F (37.1 C)  TempSrc:   Oral Oral  SpO2:   99% 98%  Weight:      Height:        Intake/Output Summary (Last 24 hours) at 05/03/2019 1138 Last data filed at 05/03/2019 0531 Gross per 24 hour  Intake 3240.49 ml  Output 1700 ml  Net 1540.49 ml   Filed Weights   05/01/19 1308 05/02/19 0451  Weight: 59 kg 52.2 kg    Examination:  General exam: NAD Respiratory system: CTAB.  No wheezes, no crackles, no rhonchi.  Speaking in full sentences.  Normal respiratory effort.   Cardiovascular system: Irregularly irregular.  No JVD.  No lower extremity edema.  Gastrointestinal system: Abdomen is nondistended, soft and bilateral CVA tenderneness. No organomegaly or masses felt. Normal bowel sounds heard.  Suprapubic catheter in  place. GU: Left inguinal hernia noted in scrotum. Central nervous system: Alert and oriented. No focal neurological deficits. Extremities: Symmetric 5 x 5 power. Skin: No rashes, lesions or ulcers Psychiatry: Judgement and insight appear normal. Mood & affect appropriate.     Data Reviewed: I have personally reviewed following labs and imaging studies  CBC: Recent Labs  Lab 05/01/19 1400 05/02/19 0136 05/03/19 0137  WBC 31.9* 26.4* 18.0*  NEUTROABS 28.5*  --  15.2*  HGB 15.5 12.1* 10.0*  HCT 45.3 37.1* 30.4*  MCV 106.3* 108.5* 109.4*  PLT 220 186 145*   Basic Metabolic Panel: Recent Labs  Lab 05/01/19 1400 05/02/19 0136 05/03/19 0137  NA 135 136 135  K 4.2 3.9 3.3*  CL 96* 105 107  CO2 25 22 20*  GLUCOSE 109* 99 114*  BUN 37* 31* 21  CREATININE 2.40* 1.60* 1.24  CALCIUM 9.8 8.3* 7.9*   MG  --  2.0  --    GFR: Estimated Creatinine Clearance: 40.3 mL/min (by C-G formula based on SCr of 1.24 mg/dL). Liver Function Tests: Recent Labs  Lab 05/01/19 1400 05/02/19 0136  AST 26 18  ALT 16 13  ALKPHOS 70 44  BILITOT 3.3* 1.8*  PROT 9.1* 6.3*  ALBUMIN 4.2 2.8*   No results for input(s): LIPASE, AMYLASE in the last 168 hours. No results for input(s): AMMONIA in the last 168 hours. Coagulation Profile: Recent Labs  Lab 05/02/19 0136  INR 1.5*   Cardiac Enzymes: No results for input(s): CKTOTAL, CKMB, CKMBINDEX, TROPONINI in the last 168 hours. BNP (last 3 results) No results for input(s): PROBNP in the last 8760 hours. HbA1C: No results for input(s): HGBA1C in the last 72 hours. CBG: No results for input(s): GLUCAP in the last 168 hours. Lipid Profile: No results for input(s): CHOL, HDL, LDLCALC, TRIG, CHOLHDL, LDLDIRECT in the last 72 hours. Thyroid Function Tests: Recent Labs    05/02/19 1149  TSH 0.714   Anemia Panel: No results for input(s): VITAMINB12, FOLATE, FERRITIN, TIBC, IRON, RETICCTPCT in the last 72 hours. Sepsis Labs: Recent Labs  Lab 05/01/19 1418 05/01/19 1645 05/02/19 0136 05/03/19 0137  PROCALCITON  --   --  9.33 9.31  LATICACIDVEN 3.4* 4.8* 2.4* 1.6    Recent Results (from the past 240 hour(s))  Urine culture     Status: None   Collection Time: 05/01/19  2:20 PM   Specimen: Urine, Random  Result Value Ref Range Status   Specimen Description   Final    URINE, RANDOM Performed at [facility], [address], [place], [place] [address]    Special Requests   Final    NONE Performed at [facility], [address] [place], [place] [address]    Culture   Final    Multiple bacterial morphotypes present, none predominant. Suggest appropriate recollection if clinically indicated.   Report Status 05/03/2019 FINAL  Final  Blood culture (routine x 2)     Status: Abnormal (Preliminary result)    Collection Time: 05/01/19  2:37 PM   Specimen: BLOOD RIGHT ARM  Result Value Ref Range Status   Specimen Description   Final    BLOOD RIGHT ARM Performed at [facility], [address] [place], [place] [address]    Special Requests   Final    BOTTLES DRAWN AEROBIC AND ANAEROBIC Blood Culture adequate volume Performed at [facility], [address] [place], [place] [address]    Culture  Setup  Time (A)  Final    GRAM VARIABLE ROD AEROBIC BOTTLE ONLY CRITICAL RESULT CALLED TO, READ BACK BY AND VERIFIED WITH: PHARMD [facility] 1610 [id number] FCP Performed at [facility], [address] [place], [place] [address]    Culture PROTEUS MIRABILIS (A)  Final   Report Status PENDING  Incomplete  Blood Culture ID Panel (Reflexed)     Status: Abnormal   Collection Time: 05/01/19  2:37 PM  Result Value Ref Range Status   Enterococcus species NOT DETECTED NOT DETECTED Final   Listeria monocytogenes NOT DETECTED NOT DETECTED Final   Staphylococcus species NOT DETECTED NOT DETECTED Final   Staphylococcus aureus (BCID) NOT DETECTED NOT DETECTED Final   Streptococcus species NOT DETECTED NOT DETECTED Final   Streptococcus agalactiae NOT DETECTED NOT DETECTED Final   Streptococcus pneumoniae NOT DETECTED NOT DETECTED Final   Streptococcus pyogenes NOT DETECTED NOT DETECTED Final   Acinetobacter baumannii NOT DETECTED NOT DETECTED Final   Enterobacteriaceae species DETECTED (A) NOT DETECTED Final    Comment: Enterobacteriaceae represent a large family of gram-negative bacteria, not a single organism. CRITICAL RESULT CALLED TO, READ BACK BY AND VERIFIED WITH: PHARMD [place] 1615 [phone] FCP    Enterobacter cloacae complex NOT DETECTED NOT DETECTED Final   Escherichia coli NOT DETECTED NOT DETECTED Final   Klebsiella oxytoca NOT DETECTED NOT DETECTED Final   Klebsiella pneumoniae NOT DETECTED NOT DETECTED Final   Proteus species DETECTED (A) NOT DETECTED Final     Comment: CRITICAL RESULT CALLED TO, READ BACK BY AND VERIFIED WITH: PHARMD [place] [phone] FCP    Serratia marcescens NOT DETECTED NOT DETECTED Final   Carbapenem resistance NOT DETECTED NOT DETECTED Final   Haemophilus influenzae NOT DETECTED NOT DETECTED Final   Neisseria meningitidis NOT DETECTED NOT DETECTED Final   Pseudomonas aeruginosa NOT DETECTED NOT DETECTED Final   Candida albicans NOT DETECTED NOT DETECTED Final   Candida glabrata NOT DETECTED NOT DETECTED Final   Candida krusei NOT DETECTED NOT DETECTED Final   Candida parapsilosis NOT DETECTED NOT DETECTED Final   Candida tropicalis NOT DETECTED NOT DETECTED Final    Comment: Performed at [facility], [address] [place], [place] [address]  Blood culture (routine x 2)     Status: None (Preliminary result)   Collection Time: 05/01/19  3:15 PM   Specimen: BLOOD  Result Value Ref Range Status   Specimen Description   Final    BLOOD LEFT ANTECUBITAL Performed at [facility], [address] [place], [place] [address]    Special Requests   Final    BOTTLES DRAWN AEROBIC AND ANAEROBIC Blood Culture adequate volume Performed at [facility], [address] [place], [place] [address]    Culture  Setup Time   Final    GRAM NEGATIVE RODS IN BOTH AEROBIC AND ANAEROBIC BOTTLES CRITICAL VALUE NOTED.  VALUE IS CONSISTENT WITH PREVIOUSLY REPORTED AND CALLED VALUE. Performed at [facility], [address] [place], [place] [address]    Culture GRAM NEGATIVE RODS  Final   Report Status PENDING  Incomplete  SARS Coronavirus 2 (CEPHEID- Performed in [facility] hospital lab), Hosp Order     Status: None   Collection Time: 05/01/19  4:55 PM   Specimen: Nasopharyngeal Swab  Result Value Ref Range Status   SARS Coronavirus 2 NEGATIVE NEGATIVE Final    Comment: (NOTE) If result is NEGATIVE SARS-CoV-2 target nucleic acids are NOT DETECTED. The SARS-CoV-2 RNA  is generally  detectable in upper and lower  respiratory specimens during the acute phase of infection. The lowest  concentration of SARS-CoV-2 viral copies this assay can detect is 250  copies / mL. A negative result does not preclude SARS-CoV-2 infection  and should not be used as the sole basis for treatment or other  patient management decisions.  A negative result may occur with  improper specimen collection / handling, submission of specimen other  than nasopharyngeal swab, presence of viral mutation(s) within the  areas targeted by this assay, and inadequate number of viral copies  (<250 copies / mL). A negative result must be combined with clinical  observations, patient history, and epidemiological information. If result is POSITIVE SARS-CoV-2 target nucleic acids are DETECTED. The SARS-CoV-2 RNA is generally detectable in upper and lower  respiratory specimens dur ing the acute phase of infection.  Positive  results are indicative of active infection with SARS-CoV-2.  Clinical  correlation with patient history and other diagnostic information is  necessary to determine patient infection status.  Positive results do  not rule out bacterial infection or co-infection with other viruses. If result is PRESUMPTIVE POSTIVE SARS-CoV-2 nucleic acids MAY BE PRESENT.   A presumptive positive result was obtained on the submitted specimen  and confirmed on repeat testing.  While 2019 novel coronavirus  (SARS-CoV-2) nucleic acids may be present in the submitted sample  additional confirmatory testing may be necessary for epidemiological  and / or clinical management purposes  to differentiate between  SARS-CoV-2 and other Sarbecovirus currently known to infect humans.  If clinically indicated additional testing with an alternate test  methodology [phone]([id number]) is advised. The SARS-CoV-2 RNA is generally  detectable in upper and lower respiratory sp ecimens during the acute  phase of infection. The  expected result is Negative. Fact Sheet for Patients:  BoilerBrush.com.cyhttps://www.fda.gov/media/136312/download Fact Sheet for Healthcare Providers: https://pope.com/https://www.fda.gov/media/136313/download This test is not yet approved or cleared by the [place] FDA and has been authorized for detection and/or diagnosis of SARS-CoV-2 by FDA under an Emergency Use Authorization (EUA).  This EUA will remain in effect (meaning this test can be used) for the duration of the COVID-19 declaration under Section 564(b)(1) of the Act, 21 U.S.C. section 360bbb-3(b)(1), unless the authorization is terminated or revoked sooner. Performed at [facility], [address] [place], [place] [address]          Radiology Studies: Dg Chest 1 View  Result Date: 05/01/2019 CLINICAL DATA:  Shortness of breath and weakness EXAM: CHEST  1 VIEW COMPARISON:  October 23, 2018 FINDINGS: Lungs are hyperexpanded. There is mild scarring in the left base. There is no edema or consolidation. Heart size and pulmonary vascularity are normal. Patient is status post coronary artery bypass grafting. There is a loop recorder on the left. There is aortic atherosclerosis. No evident adenopathy. No pneumothorax. No bone lesions. IMPRESSION: Lungs hyperexpanded with mild scarring in the left base, stable. No edema or consolidation. Stable cardiac silhouette. Postoperative changes noted. Aortic Atherosclerosis (ICD10-I70.0). Electronically Signed   By: Bretta BangWilliam  Woodruff III M.D.   On: 05/01/2019 15:51   Ct Renal Stone Study  Result Date: 05/01/2019 CLINICAL DATA:  Initial evaluation for hematuria, unknown cause. EXAM: CT ABDOMEN AND PELVIS WITHOUT CONTRAST TECHNIQUE: Multidetector CT imaging of the abdomen and pelvis was performed following the standard protocol without IV contrast. COMPARISON:  Prior CT from 06/11/2017. FINDINGS: Lower chest: 15 x 9 mm nodule present at the central aspect of the  right lower lobe (series 4, image 8),  indeterminate. Few additional 4 mm nodules within the subpleural right lower lung (series 4, images 15, 23). Fat containing Bochdalek's type hernias noted at the lung bases bilaterally. Mild scattered subsegmental atelectasis seen dependently. Hepatobiliary: Limited noncontrast evaluation liver is unremarkable. Gallbladder within normal limits. No biliary dilatation. Pancreas: Pancreas within normal limits. Spleen: Spleen within normal limits. Adrenals/Urinary Tract: Adrenal glands are normal. Right kidney unremarkable without nephrolithiasis or hydronephrosis. No radiopaque calculi seen along the course of the right renal collecting system. No right-sided hydroureter. On the left, there is mild left hydroureter without frank hydronephrosis. Mild perinephric and periureteral fat stranding. Punctate 3 mm nonobstructive stone within the interpolar left kidney. No distally obstructive stone identified. Findings could reflect sequelae of a recently passed stone or possibly infection. Bladder decompressed with an indwelling suprapubic catheter in place. Circumferential wall thickening likely related incomplete distension and/or underlying neurogenic bladder. Scattered calcifications noted within the bladder wall. Gas lucency within the bladder lumen compatible with catheterization. Superimposed cystitis could also be considered. No definite layering stones within the decompressed bladder lumen. Stomach/Bowel: Stomach decompressed without acute abnormality. No evidence for bowel obstruction. Left inguinal hernia containing fat and loops of small bowel present without associated obstruction or inflammation. No acute inflammatory changes seen elsewhere about the bowels. Vascular/Lymphatic: Moderate aorto bi-iliac atherosclerotic disease. No aneurysm. No appreciable adenopathy. Reproductive: Prostate enlarged measuring 5 cm in transverse diameter. Other: No free air or fluid. Musculoskeletal: No acute osseous abnormality. No  discrete lytic or blastic osseous lesions. 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 Right total hip arthroplasty in place. IMPRESSION: 1. Mild left hydroureteronephrosis with mild left perinephric and periureteral fat stranding. No obstructive stone identified. Findings could reflect sequelae of a recently passed stone or possibly infection. Correlation with urinalysis recommended. 2. Punctate 3 mm nonobstructive left renal nephrolithiasis. 3. Suprapubic catheter in place with decompression of the urinary bladder. Circumferential wall thickening at least in part related to incomplete distension and/or underlying neurogenic bladder. Superimposed cystitis could also be considered. 4. Small bowel containing left inguinal hernia without associated obstruction or inflammation. 5. Moderate aorto bi-iliac atherosclerotic disease. 6. Enlarged prostate. 7. 15 x 9 mm right lower lobe nodule, indeterminate. Consider one of the following in 3 months for both low-risk and high-risk individuals: (a) repeat chest CT, () follow-up PET-CT, or () tissue sampling. This recommendation follows the consensus statement: Guidelines for Management of Incidental Pulmonary Nodules Detected on CT Images: From the Fleischner Society 2017; Radiology 2017; 284:228-243. Electronically Signed   By: Rise MuBenjamin  McClintock M.D.   On: 05/01/2019 16:29        Scheduled Meds: . aspirin EC  81 mg Oral Daily  . atorvastatin  80 mg Oral Daily  . carvedilol  3.125 mg Oral BID  . sodium chloride flush  3 mL Intravenous Q12H   Continuous Infusions: . sodium chloride 75 mL/hr at 05/03/19 1111  . amiodarone 30 mg/hr (05/03/19 0713)  . cefTRIAXone (ROCEPHIN)  IV 2 g (05/02/19 1720)  . heparin 1,200 Units/hr (05/02/19 1313)     LOS: 2 days    Time spent: 40 minutes    Ramiro Harvestaniel Lilianna Case, MD [facility]  If 7PM-7AM, please contact night-coverage [email or web address] 05/03/2019, 11:38 AM

## 2019-05-03 NOTE — Progress Notes (Signed)
[place] for heparin Indication: new onset atrial fibrillation  Allergies  Allergen Reactions  . Lactose Intolerance (Gi) Diarrhea    Patient Measurements: Height: 5\' 8"  (172.7 cm) Weight: 115 lb 1.3 oz (52.2 kg) IBW/kg (Calculated) : 68.4 Heparin Dosing Weight: 59 kg  Vital Signs: Temp: 98.7 F (37.1 C) (06/27 2106) Temp Source: Oral (06/27 2106) BP: 137/76 (06/27 2106) Pulse Rate: 70 (06/27 2106)  Labs: Recent Labs    05/01/19 1400 05/02/19 0136 05/02/19 1149 05/03/19 0137  HGB 15.5 12.1*  --  10.0*  HCT 45.3 37.1*  --  30.4*  PLT 220 186  --  145*  LABPROT  --  18.0*  --   --   INR  --  1.5*  --   --   HEPARINUNFRC  --  0.24* 0.24* 0.50  CREATININE 2.40* 1.60*  --  1.24    Estimated Creatinine Clearance: 40.3 mL/min (by C-G formula based on SCr of 1.24 mg/dL).   Assessment: Patient's a 73 y.o M with hx recurrent renal stones and hematuria and CABG presented to the ED on 6/26 with dislodged suprapubic cath and c/o abd pain. He was found to have new onset afib. To start heparin drip for afib.   Baseline INR slightly elevated  Prior anticoagulation: none  Significant events:  6/27  CBC: Hgb down after admission, suspect hemodilution  Most recent heparin level remains SUBtherapeutic and unchanged despite rate increase to 1050 units/hr  No bleeding or infusion issues per nursing Today 6/28  0137 HL = 0.50 at goal, no infusion or bleeding issues per RN  H/H =10/30.4, Plts = 145  Goal of Therapy: Heparin level 0.3-0.7 units/ml Monitor platelets by anticoagulation protocol: Yes  Plan:  Continue heparin drip at 1200 units/hr  Recheck HL to confirm  Daily CBC, daily heparin level once stable  Monitor for signs of bleeding or thrombosis  Dorrene German 05/03/2019, 3:17 AM

## 2019-05-03 NOTE — Consult Note (Signed)
Surgical Consultation Requesting provider: Dr. Janee Mornhompson  CC: inguinal hernia  HPI: This is a very pleasant 73 year old man with multiple medical problems as listed below including recurrent renal stones and hematuria, suprapubic tube in place, coronary artery disease status post CABG, COPD, peripheral vascular disease, history of embolic stroke, and ongoing tobacco abuse who is currently admitted for treatment of urinary retention and urosepsis/pyelonephritis, new onset atrial fibrillation likely to require long-term anticoagulation, and acute renal failure.  While being treated for these, he has made note of a large left inguinal scrotal hernia which he states is been present for a couple of weeks.  He states that it will swell and enlarge with activity, standing, coughing, etc. and that it goes back down when he is supine.  He reports normal appetite, denies any abdominal bloating, nausea or emesis; reports that he is having bowel movements but has noted discomfort in the region of the hernia when he strains to have a bowel movement.  He states he has had previous hernia surgery but does not recall which side (right per chart review).  Surgery is asked to evaluate the hernia and discussed repair options with the patient.  Allergies  Allergen Reactions  . Lactose Intolerance (Gi) Diarrhea    Past Medical History:  Diagnosis Date  . Bladder calculi   . COPD (chronic obstructive pulmonary disease) (HCC)   . DOE (dyspnea on exertion)   . Elevated blood pressure reading    12-22-2018 reprots he hasnt had his BP meds since this past thursday   . Foley catheter in place   . History of bladder stone   . 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 History of urinary retention   . Hypertension   . Mobitz type 1 second degree atrioventricular block   . Osteoarthritis   . Paroxysmal atrial  tachycardia (HCC)   . Peripheral vascular disease (HCC)    last duplex 09-25-2016 bilateral ABI 0.6 and occulsion of  right popiteal posterior tibials and left peroneal  . Wears glasses     Past Surgical History:  Procedure Laterality Date  . CORONARY ARTERY BYPASS GRAFT N/A 09/05/2018   Procedure: CORONARY ARTERY BYPASS GRAFTING (CABG) times 3 using left internal mammary artery and right greater saphenous vein harvested endoscopically.;  Surgeon: Kerin PernaVan Trigt, Peter, MD;  [facility];  Service: Open Heart Surgery;  Laterality: N/A;  . CYSTOSCOPY WITH LITHOLAPAXY N/A 07/15/2017   Procedure: OPEN CYSTOLITHOTOMY;  Surgeon: Crista ElliotBell, Eugene D III, MD;  Location: [facility] ORS;  Service: Urology;  Laterality: N/A;  . INGUINAL HERNIA REPAIR Right 2011  . INSERTION OF SUPRAPUBIC CATHETER N/A 07/15/2017   Procedure: INSERTION OF SUPRAPUBIC CATHETER;  Surgeon: Crista ElliotBell, Eugene D III, MD;  Location: [facility];  Service: Urology;  Laterality: N/A;  . LEFT HEART CATH AND CORONARY ANGIOGRAPHY N/A 09/03/2018   Procedure: LEFT HEART CATH AND CORONARY ANGIOGRAPHY;  Surgeon: Iran OuchArida, Muhammad A, MD;  Location: [facility];  Service: Cardiovascular;  Laterality: N/A;  . LOOP RECORDER IMPLANT N/A 01/05/2015   Procedure: LOOP RECORDER IMPLANT;  Surgeon: Thurmon FairMihai Croitoru, MD;  Location: [facility] CATH [facility];  Service: Cardiovascular;  Laterality: N/A;  . ORIF WRIST FRACTURE Left 03/20/2014   Procedure: OPEN REDUCTION INTERNAL FIXATION (ORIF) WRIST FRACTURE;  Surgeon: Dominica SeverinWilliam Gramig, MD;  Location: [facility] OR;  Service: Orthopedics;  Laterality: Left;  . TEE WITHOUT CARDIOVERSION N/A 01/05/2015  Procedure: TRANSESOPHAGEAL ECHOCARDIOGRAM (TEE)/LOOP;  Surgeon: Thurmon FairMihai Croitoru, MD;  Location: [facility] ENDOS[facility];  Service: Cardiovascular;  Laterality: N/A; ef 60-65%, no LA or RA thrombus, trivial PR, mild TR  . TEE WITHOUT CARDIOVERSION N/A 09/05/2018   Procedure: TRANSESOPHAGEAL ECHOCARDIOGRAM (TEE);  Surgeon: Donata ClayVan Trigt, Theron AristaPeter, MD;  [facility];   Service: Open Heart Surgery;  Laterality: N/A;  . TONSILLECTOMY  child  . TOTAL HIP ARTHROPLASTY Right 2000    Family History  Problem Relation Age of Onset  . Stroke Mother   . Stroke Sister     Social History   Socioeconomic History  . Marital status: Single    Spouse name: Not on file  . Number of children: 4  . Years of education: 9  . Highest education level: Not on file  Occupational History  . Occupation: retired    Comment: Training and development officerconstruction  Social Needs  . Financial resource strain: Not on file  . Food insecurity    Worry: Not on file    Inability: Not on file  . Transportation needs    Medical: Not on file    Non-medical: Not on file  Tobacco Use  . Smoking status: Current Every Day Smoker    Years: 52.00    Types: Cigarettes  . Smokeless tobacco: Never Used  . Tobacco comment: 07-08-2018 pt is down to 1ppw from 1 ppd, 12-22-2018 about  half a pack per day   Substance and Sexual Activity  . Alcohol use: Yes    Alcohol/week: 0.0 standard drinks    Comment: 08/2018 ! drink 1 pint a day 3 times a week on average  . Drug use: No    Comment: history of cocaine ; last use 7 months ago   . Sexual activity: Not on file  Lifestyle  . Physical activity    Days per week: Not on file    Minutes per session: Not on file  . Stress: Not on file  Relationships  . Social Musicianconnections    Talks on phone: Not on file    Gets together: Not on file    Attends religious service: Not on file    Active member of club or organization: Not on file    Attends meetings of clubs or organizations: Not on file    Relationship status: Not on file  Other Topics Concern  . Not on file  Social History Narrative   Single   Right handed   Caffeine use - sodas on weekends with alcohol    No current facility-administered medications on file prior to encounter.    Current Outpatient Medications on File Prior to Encounter  Medication Sig Dispense Refill  . atorvastatin (LIPITOR) 80 MG  tablet Take 1 tablet (80 mg total) by mouth daily. 90 tablet 3  . carvedilol (COREG) 6.25 MG tablet Take 1 tablet (6.25 mg total) by mouth 2 (two) times daily. 180 tablet 3  . lisinopril (ZESTRIL) 2.5 MG tablet Take 1 tablet (2.5 mg total) by mouth daily. 90 tablet 3  . aspirin EC 81 MG tablet Take 1 tablet (81 mg total) by mouth daily. (Patient not taking: Reported on 05/01/2019) 90 tablet 3    Review of Systems: a complete, 10pt review of systems was completed with pertinent positives and negatives as documented in the HPI  Physical Exam: Vitals:   05/03/19 0422 05/03/19 1227  BP: 118/82 122/76  Pulse: 65 (!) 56  Resp: 18 16  Temp: 98.8 F (37.1 C) (!) 97.5 F (36.4  C)  SpO2: 98% 98%   Gen: A&Ox3, no distress Head: normocephalic, atraumatic Eyes: extraocular motions intact, anicteric.  Neck: supple without mass or thyromegaly Chest: unlabored respirations, symmetrical air entry Cardiovascular: No pedal edema, irregular Abdomen: thin, soft, nondistended, nontender. No mass or organomegaly.  Well-healed low midline and right oblique groin scars.  There is a large left inguinal scrotal hernia which is easily reducible with the patient supine. Extremities: warm, without edema, no deformities  Neuro: grossly intact Psych: appropriate mood and affect, normal insight  Skin: warm and dry   CBC Latest Ref Rng & Units 05/03/2019 05/02/2019 05/01/2019  WBC 4.0 - 10.5 K/uL 18.0(H) 26.4(H) 31.9(H)  Hemoglobin 13.0 - 17.0 g/dL 10.0(L) 12.1(L) 15.5  Hematocrit 39.0 - 52.0 % 30.4(L) 37.1(L) 45.3  Platelets 150 - 400 K/uL 145(L) 186 220    CMP Latest Ref Rng & Units 05/03/2019 05/02/2019 05/01/2019  Glucose 70 - 99 mg/dL 270(B) 99 867(J)  BUN 8 - 23 mg/dL 21 44(B) 20(F)  Creatinine 0.61 - 1.24 mg/dL 0.07 1.21(F) 7.58(I)  Sodium 135 - 145 mmol/L 135 136 135  Potassium 3.5 - 5.1 mmol/L 3.3(L) 3.9 4.2  Chloride 98 - 111 mmol/L 107 105 96(L)  CO2 22 - 32 mmol/L 20(L) 22 25  Calcium 8.9 - 10.3  mg/dL 7.9(L) 8.3(L) 9.8  Total Protein 6.5 - 8.1 g/dL - 6.3(L) 9.1(H)  Total Bilirubin 0.3 - 1.2 mg/dL - 1.8(H) 3.3(H)  Alkaline Phos 38 - 126 U/L - 44 70  AST 15 - 41 U/L - 18 26  ALT 0 - 44 U/L - 13 16    Lab Results  Component Value Date   INR 1.5 (H) 05/02/2019   INR 1.05 12/22/2018   INR 1.46 09/05/2018    Imaging: Ct 6/26 CLINICAL DATA:  Initial evaluation for hematuria, unknown cause.  EXAM: CT ABDOMEN AND PELVIS WITHOUT CONTRAST  TECHNIQUE: Multidetector CT imaging of the abdomen and pelvis was performed following the standard protocol without IV contrast.  COMPARISON:  Prior CT from 06/11/2017.  FINDINGS: Lower chest: 15 x 9 mm nodule present at the central aspect of the right lower lobe (series 4, image 8), indeterminate. Few additional 4 mm nodules within the subpleural right lower lung (series 4, images 15, 23). Fat containing Bochdalek's type hernias noted at the lung bases bilaterally. Mild scattered subsegmental atelectasis seen dependently.  Hepatobiliary: Limited noncontrast evaluation liver is unremarkable. Gallbladder within normal limits. No biliary dilatation.  Pancreas: Pancreas within normal limits.  Spleen: Spleen within normal limits.  Adrenals/Urinary Tract: Adrenal glands are normal. Right kidney unremarkable without nephrolithiasis or hydronephrosis. No radiopaque calculi seen along the course of the right renal collecting system. No right-sided hydroureter. On the left, there is mild left hydroureter without frank hydronephrosis. Mild perinephric and periureteral fat stranding. Punctate 3 mm nonobstructive stone within the interpolar left kidney. No distally obstructive stone identified. Findings could reflect sequelae of a recently passed stone or possibly infection. Bladder decompressed with an indwelling suprapubic catheter in place. Circumferential wall thickening likely related incomplete distension and/or underlying neurogenic bladder.  Scattered calcifications noted within the bladder wall. Gas lucency within the bladder lumen compatible with catheterization. Superimposed cystitis could also be considered. No definite layering stones within the decompressed bladder lumen.  Stomach/Bowel: Stomach decompressed without acute abnormality. No evidence for bowel obstruction. Left inguinal hernia containing fat and loops of small bowel present without associated obstruction or  inflammation. No acute inflammatory changes seen elsewhere about the bowels.  Vascular/Lymphatic: Moderate aorto  bi-iliac atherosclerotic disease. No aneurysm. No appreciable adenopathy.  Reproductive: Prostate enlarged measuring 5 cm in transverse diameter.  Other: No free air or fluid.  Musculoskeletal: No acute osseous abnormality. No discrete lytic or blastic osseous lesions. 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 Right total hip arthroplasty in place.  IMPRESSION: 1. Mild left hydroureteronephrosis with mild left perinephric and periureteral fat stranding. No obstructive stone identified. Findings could reflect sequelae of a recently passed stone or possibly infection. Correlation with urinalysis recommended.  2. Punctate 3 mm nonobstructive left renal nephrolithiasis. 3. Suprapubic catheter in place with decompression of the urinary bladder. Circumferential wall thickening at least in part related to incomplete distension and/or underlying neurogenic bladder. Superimposed cystitis could also be considered.  4. Small bowel containing left inguinal hernia without associated obstruction or inflammation. 5. Moderate aorto bi-iliac atherosclerotic disease. 6. Enlarged prostate. 7. 15 x 9 mm right lower lobe nodule, indeterminate. Consider one of the following in 3 months for both low-risk and high-risk individuals: (a) repeat chest CT, () follow-up PET-CT, or () tissue sampling. This recommendation follows the consensus  statement: Guidelines for Management of Incidental Pulmonary Nodules Detected on CT Images: From the Fleischner Society 2017; Radiology 2017;284:228-243.   Electronically Signed   By: Jeannine Boga M.D.   On: 05/01/2019 16:29   A/P: Lovely 73 year old gentleman with medical issues as listed above noted to have a large left inguinal scrotal hernia.  Currently this is reducible but as it does intermittently contain bowel, ultimately repair would be ideal.  There are couple of obstacles he needs to overcome before being a candidate for elective hernia surgery, first of all clearing his bacteremia/urosepsis, and more importantly he needs to quit smoking.  I discussed this with him.  I also counseled him as to the signs and symptoms of incarceration/strangulation which should prompt him to seek emergency treatment.  In the interim, he may find it helpful to wear a hernia belt to help support the area and keep the contents reduced when he is up and around.    At this time no plans for urgent surgical intervention.  Surgical service will sign off.  Please call if we can be of help.   Romana Juniper, MD [facility] Surgery, [place] Pager [phone]

## 2019-05-03 NOTE — Progress Notes (Signed)
[place] for heparin Indication: new onset atrial fibrillation  Allergies  Allergen Reactions  . Lactose Intolerance (Gi) Diarrhea    Patient Measurements: Height: 5\' 8"  (172.7 cm) Weight: 115 lb 1.3 oz (52.2 kg) IBW/kg (Calculated) : 68.4 Heparin Dosing Weight: 59 kg  Vital Signs: Temp: 98.3 F (36.8 C) (06/28 2039) Temp Source: Oral (06/28 2039) BP: 134/48 (06/28 2039) Pulse Rate: 71 (06/28 2039)  Labs: Recent Labs    05/01/19 1400 05/02/19 0136  05/03/19 0137 05/03/19 1100 05/03/19 2100  HGB 15.5 12.1*  --  10.0*  --   --   HCT 45.3 37.1*  --  30.4*  --   --   PLT 220 186  --  145*  --   --   LABPROT  --  18.0*  --   --   --   --   INR  --  1.5*  --   --   --   --   HEPARINUNFRC  --  0.24*   < > 0.50 0.27* 0.38  CREATININE 2.40* 1.60*  --  1.24  --   --    < > = values in this interval not displayed.    Estimated Creatinine Clearance: 40.3 mL/min (by C-G formula based on SCr of 1.24 mg/dL).   Assessment: Patient's a 73 y.o M with hx recurrent renal stones and hematuria and CABG presented to the ED on 6/26 with dislodged suprapubic cath and c/o abd pain. He was found to have new onset afib. To start heparin drip for afib.   Baseline INR slightly elevated  Prior anticoagulation: none  Significant events:  Today, 05/03/2019:  CBC: Hgb down after admission, suspect hemodilution  Most recent heparin level again SUBtherapeutic after rising to 0.5 previously; RN reports no issues with site, line, or pump that would result in falsely low levels  No hematuria or other bleeding per RN  2100 HL = 0.38 at goal, no issues reported by RN.   Goal of Therapy: Heparin level 0.3-0.7 units/ml Monitor platelets by anticoagulation protocol: Yes  Plan:  Continue heparin drip at 1300 units/hr  Check heparin level 8 hrs after rate change  Daily CBC, daily heparin level once stable  Monitor for signs of bleeding or  thrombosis   Dorrene German 05/03/2019, 9:47 PM

## 2019-05-04 ENCOUNTER — Inpatient Hospital Stay (HOSPITAL_COMMUNITY): Payer: Medicare Other

## 2019-05-04 DIAGNOSIS — E538 Deficiency of other specified B group vitamins: Secondary | ICD-10-CM

## 2019-05-04 DIAGNOSIS — R05 Cough: Secondary | ICD-10-CM

## 2019-05-04 DIAGNOSIS — D649 Anemia, unspecified: Secondary | ICD-10-CM

## 2019-05-04 DIAGNOSIS — N10 Acute tubulo-interstitial nephritis: Secondary | ICD-10-CM

## 2019-05-04 DIAGNOSIS — N3001 Acute cystitis with hematuria: Secondary | ICD-10-CM

## 2019-05-04 DIAGNOSIS — R059 Cough, unspecified: Secondary | ICD-10-CM

## 2019-05-04 DIAGNOSIS — K4090 Unilateral inguinal hernia, without obstruction or gangrene, not specified as recurrent: Secondary | ICD-10-CM

## 2019-05-04 LAB — CULTURE, BLOOD (ROUTINE X 2)
Special Requests: ADEQUATE
Special Requests: ADEQUATE

## 2019-05-04 LAB — CBC WITH DIFFERENTIAL/PLATELET
Abs Immature Granulocytes: 0.06 10*3/uL (ref 0.00–0.07)
Basophils Absolute: 0 10*3/uL (ref 0.0–0.1)
Basophils Relative: 0 %
Eosinophils Absolute: 0.2 10*3/uL (ref 0.0–0.5)
Eosinophils Relative: 2 %
HCT: 28.1 % — ABNORMAL LOW (ref 39.0–52.0)
Hemoglobin: 9 g/dL — ABNORMAL LOW (ref 13.0–17.0)
Immature Granulocytes: 1 %
Lymphocytes Relative: 10 %
Lymphs Abs: 1.1 10*3/uL (ref 0.7–4.0)
MCH: 35 pg — ABNORMAL HIGH (ref 26.0–34.0)
MCHC: 32 g/dL (ref 30.0–36.0)
MCV: 109.3 fL — ABNORMAL HIGH (ref 80.0–100.0)
Monocytes Absolute: 1.3 10*3/uL — ABNORMAL HIGH (ref 0.1–1.0)
Monocytes Relative: 12 %
Neutro Abs: 8.2 10*3/uL — ABNORMAL HIGH (ref 1.7–7.7)
Neutrophils Relative %: 75 %
Platelets: 154 10*3/uL (ref 150–400)
RBC: 2.57 MIL/uL — ABNORMAL LOW (ref 4.22–5.81)
RDW: 14.7 % (ref 11.5–15.5)
WBC: 10.9 10*3/uL — ABNORMAL HIGH (ref 4.0–10.5)
nRBC: 0 % (ref 0.0–0.2)

## 2019-05-04 LAB — OCCULT BLOOD X 1 CARD TO LAB, STOOL: Fecal Occult Bld: NEGATIVE

## 2019-05-04 LAB — BASIC METABOLIC PANEL
Anion gap: 8 (ref 5–15)
BUN: 15 mg/dL (ref 8–23)
CO2: 20 mmol/L — ABNORMAL LOW (ref 22–32)
Calcium: 8.2 mg/dL — ABNORMAL LOW (ref 8.9–10.3)
Chloride: 110 mmol/L (ref 98–111)
Creatinine, Ser: 1.18 mg/dL (ref 0.61–1.24)
GFR calc Af Amer: >60 mL/min (ref >60)
GFR calc non Af Amer: >60 mL/min (ref >60)
Glucose, Bld: 108 mg/dL — ABNORMAL HIGH (ref 70–99)
Potassium: 3.4 mmol/L — ABNORMAL LOW (ref 3.5–5.1)
Sodium: 138 mmol/L (ref 135–145)

## 2019-05-04 LAB — MAGNESIUM: Magnesium: 1.8 mg/dL (ref 1.7–2.4)

## 2019-05-04 LAB — VITAMIN B12: Vitamin B-12: 154 pg/mL — ABNORMAL LOW (ref 180–914)

## 2019-05-04 LAB — URINE CULTURE: Culture: <10000 — AB

## 2019-05-04 LAB — FERRITIN: Ferritin: 375 ng/mL — ABNORMAL HIGH (ref 24–336)

## 2019-05-04 LAB — IRON AND TIBC
Iron: 11 ug/dL — ABNORMAL LOW (ref 45–182)
Saturation Ratios: 6 % — ABNORMAL LOW (ref 17.9–39.5)
TIBC: 174 ug/dL — ABNORMAL LOW (ref 250–450)
UIBC: 163 ug/dL

## 2019-05-04 LAB — PROCALCITONIN: Procalcitonin: 5.43 ng/mL

## 2019-05-04 LAB — FOLATE: Folate: 6.1 ng/mL (ref >5.9)

## 2019-05-04 LAB — HEPARIN LEVEL (UNFRACTIONATED): Heparin Unfractionated: 2 IU/mL — ABNORMAL HIGH (ref 0.30–0.70)

## 2019-05-04 MED ORDER — UMECLIDINIUM BROMIDE 62.5 MCG/INH IN AEPB
1.0000 | INHALATION_SPRAY | Freq: Every day | RESPIRATORY_TRACT | Status: DC
  Administered 2019-05-06: 1 via RESPIRATORY_TRACT
  Filled 2019-05-04 (×2): qty 7

## 2019-05-04 MED ORDER — POTASSIUM CHLORIDE 20 MEQ PO PACK
40.0000 meq | PACK | ORAL | Status: AC
  Administered 2019-05-04 (×2): 40 meq via ORAL
  Filled 2019-05-04 (×2): qty 2

## 2019-05-04 MED ORDER — HEPARIN (PORCINE) 25000 UT/250ML-% IV SOLN
900.0000 [IU]/h | INTRAVENOUS | Status: DC
  Administered 2019-05-04: 900 [IU]/h via INTRAVENOUS
  Filled 2019-05-04: qty 250

## 2019-05-04 MED ORDER — LEVALBUTEROL HCL 0.63 MG/3ML IN NEBU: 0.6300 mg | INHALATION_SOLUTION | RESPIRATORY_TRACT | Status: DC | PRN

## 2019-05-04 MED ORDER — HYDROCODONE-HOMATROPINE 5-1.5 MG/5ML PO SYRP
5.0000 mL | ORAL_SOLUTION | Freq: Four times a day (QID) | ORAL | Status: DC | PRN
  Administered 2019-05-04 – 2019-05-05 (×2): 5 mL via ORAL
  Filled 2019-05-04 (×2): qty 5

## 2019-05-04 MED ORDER — POTASSIUM CHLORIDE CRYS ER 20 MEQ PO TBCR
40.0000 meq | EXTENDED_RELEASE_TABLET | Freq: Once | ORAL | Status: AC
  Administered 2019-05-04: 40 meq via ORAL
  Filled 2019-05-04: qty 2

## 2019-05-04 MED ORDER — MOMETASONE FURO-FORMOTEROL FUM 100-5 MCG/ACT IN AERO
2.0000 | INHALATION_SPRAY | Freq: Two times a day (BID) | RESPIRATORY_TRACT | Status: DC
  Administered 2019-05-04 – 2019-05-06 (×4): 2 via RESPIRATORY_TRACT
  Filled 2019-05-04: qty 8.8

## 2019-05-04 MED ORDER — LEVALBUTEROL HCL 0.63 MG/3ML IN NEBU
0.6300 mg | INHALATION_SOLUTION | Freq: Three times a day (TID) | RESPIRATORY_TRACT | Status: DC
  Filled 2019-05-04: qty 3

## 2019-05-04 MED ORDER — IPRATROPIUM-ALBUTEROL 0.5-2.5 (3) MG/3ML IN SOLN: 3.0000 mL | Freq: Three times a day (TID) | RESPIRATORY_TRACT | Status: DC

## 2019-05-04 MED ORDER — SODIUM CHLORIDE 0.9 % IV SOLN
510.0000 mg | Freq: Once | INTRAVENOUS | Status: AC
  Administered 2019-05-04: 510 mg via INTRAVENOUS
  Filled 2019-05-04: qty 17

## 2019-05-04 MED ORDER — APIXABAN 5 MG PO TABS
5.0000 mg | ORAL_TABLET | Freq: Two times a day (BID) | ORAL | Status: DC
  Administered 2019-05-04 – 2019-05-06 (×4): 5 mg via ORAL
  Filled 2019-05-04 (×4): qty 1

## 2019-05-04 MED ORDER — HEPARIN (PORCINE) 25000 UT/250ML-% IV SOLN: 900.0000 [IU]/h | INTRAVENOUS | Status: AC

## 2019-05-04 MED ORDER — MAGNESIUM SULFATE 2 GM/50ML IV SOLN
2.0000 g | Freq: Once | INTRAVENOUS | Status: AC
  Administered 2019-05-04: 2 g via INTRAVENOUS
  Filled 2019-05-04: qty 50

## 2019-05-04 MED ORDER — CYANOCOBALAMIN 1000 MCG/ML IJ SOLN
1000.0000 ug | Freq: Every day | INTRAMUSCULAR | Status: DC
  Administered 2019-05-04 – 2019-05-06 (×3): 1000 ug via SUBCUTANEOUS
  Filled 2019-05-04 (×3): qty 1

## 2019-05-04 MED ORDER — SODIUM CHLORIDE 0.9 % IV SOLN
2.0000 g | Freq: Four times a day (QID) | INTRAVENOUS | Status: DC
  Administered 2019-05-05 – 2019-05-06 (×6): 2 g via INTRAVENOUS
  Filled 2019-05-04: qty 2000
  Filled 2019-05-04 (×2): qty 2
  Filled 2019-05-04: qty 2000
  Filled 2019-05-04 (×3): qty 2
  Filled 2019-05-04: qty 2000
  Filled 2019-05-04: qty 2

## 2019-05-04 MED ORDER — TIOTROPIUM BROMIDE MONOHYDRATE 18 MCG IN CAPS: 18.0000 ug | ORAL_CAPSULE | Freq: Every day | RESPIRATORY_TRACT | Status: DC

## 2019-05-04 MED ORDER — POTASSIUM CHLORIDE CRYS ER 20 MEQ PO TBCR: 40.0000 meq | EXTENDED_RELEASE_TABLET | Freq: Once | ORAL | Status: DC

## 2019-05-04 NOTE — Care Management Important Message (Signed)
Important Message  Patient Details IM Letter given to Dessa Phi RN to present to the Patient Name: Joe Durham MRN: [id number] Date of Birth: Mar 18, 1946   Medicare Important Message Given:  Yes     Kerin Salen 05/04/2019, 3:18 PM

## 2019-05-04 NOTE — Progress Notes (Addendum)
[place] for heparin and transition to apixaban Indication: new onset atrial fibrillation  Allergies  Allergen Reactions  . Lactose Intolerance (Gi) Diarrhea    Patient Measurements: Height: 5\' 8"  (172.7 cm) Weight: 125 lb 10.6 oz (57 kg) IBW/kg (Calculated) : 68.4 Heparin Dosing Weight = TBW = 57 kg  Vital Signs: Temp: 97.6 F (36.4 C) (06/29 0458) Temp Source: Oral (06/29 0458) BP: 115/54 (06/29 0458) Pulse Rate: 55 (06/29 0458)  Labs: Recent Labs    05/02/19 0136  05/03/19 0137 05/03/19 1100 05/03/19 2100 05/04/19 0411  HGB 12.1*  --  10.0*  --   --  9.0*  HCT 37.1*  --  30.4*  --   --  28.1*  PLT 186  --  145*  --   --  154  LABPROT 18.0*  --   --   --   --   --   INR 1.5*  --   --   --   --   --   HEPARINUNFRC 0.24*   < > 0.50 0.27* 0.38 2.00*  CREATININE 1.60*  --  1.24  --   --  1.18   < > = values in this interval not displayed.    Estimated Creatinine Clearance: 46.3 mL/min (by C-G formula based on SCr of 1.18 mg/dL).   Assessment: Patient's a 73 y.o M with hx recurrent renal stones and hematuria and CABG presented to the ED on 6/26 with dislodged suprapubic cath and c/o abd pain. He was found to have new onset afib and heparin drip initiated for anticoagulation.   Baseline INR slightly elevated 1.5  Prior anticoagulation: none  Significant events: 6/29: Pharmacy consulted to transition anticoagulation to apixaban. Per discussion with cardiologist, will await results of fecal occult prior to transitioning to PO anticoagulation.  Today, 05/04/19  Hgb 9 - trending down. MD aware -  hemodilution could be contributing factor.  Plt 154 - WNL  Per discussion with RN and MD - no overt signs/symptoms of bleeding  Fecal occult ordered  Goal of Therapy: Heparin level 0.3-0.7 units/ml Monitor platelets by anticoagulation protocol: Yes  Plan:   Per discussion with cardiology, discontinue ASA 81 mg. Continue  heparin drip pending results of fecal occult. If fecal occult negative, can transition to apixaban.  Resume heparin drip at 900 units/hr   Check heparin level 8 hours after heparin resumed. Drip was off for ~45 mins - 1 hour prior to being restarted  Daily CBC, daily heparin level once stable  Monitor for signs of bleeding or thrombosis  Follow for transition to apixaban  Lenis Noon, PharmD 05/04/2019, 12:31 PM   Addendum (6/29 evening):  Fecal occult obtained 6/29 is negative. Will convert anticoagulation to apixaban for atrial fibrillation.  Plan:  Discontinue heparin drip  Start apixaban at time of heparin drip discontinuation  Initiate apixaban 5 mg PO BID   Age: 73, Wt: 57 kg, SCr: 1.18  Pt will need medication counseling and manufacturer coupon prior to discharge  CBC with AM labs tomorrow  Lenis Noon, PharmD 05/04/19 6:36 PM

## 2019-05-04 NOTE — Discharge Instructions (Signed)

## 2019-05-04 NOTE — Progress Notes (Addendum)
PROGRESS NOTE    Joe Durham  WUJ:[id number] DOB: [birth year] DOA: 05/01/2019 PCP: Patient, No Pcp Per    Brief Narrative:  HPI per Dr Elie Goody is a 73 y.o. male with medical history significant of recurrent renal stones and hematuria, CAD status post CABG, hypertension and hyperlipidemia, PAT, COPD presented to ER with a complaint of abdominal pressure and dislodging of suprapubic catheter.  According to patient, his suprapubic catheter fell off suddenly 2 days ago.  Since then he has been having suprapubic pressure/pain which has gotten significantly worse over the past 24 hours.  He has also been having some shortness of breath with exertion.  Denies any other complaints such as cough, fever, diarrhea, nausea, vomiting, dizziness, headache or any other complaint.  No sick contacts and no recent travel.  He has noticed some dribbling of urine from suprapubic catheter insertion site and from penis.  ED Course: In the ED, he had low-grade fever of 100.7 with some tachycardia up to 106.  CBC showed severe leukocytosis of more than 31 and CMP showed creatinine of 2.4 and bilirubin of 3.3.  Urine analysis was positive for bacteria, white cells and leukoesterase.  He was diagnosed with sepsis due to UTI.  He was given Zosyn.  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  Patient currently afebrile.  Leukocytosis trending down.  Lactic acid trending down.  Blood cultures with 2/4 positive for Proteus Mirabilis.  Urinalysis concerning for UTI.  Urine cultures negative to date.  Repeat urine cultures pending.  Patient with bilateral CVA tenderness ( R > L) to palpation.  CT renal stone protocol done was concerning for pyelonephritis and did show left hydroureteronephrosis with mild left perinephric and periureteral fat stranding.  Sensitivities have resulted which is pansensitive.  Repeat blood cultures x2.  Will narrow IV Rocephin to IV ampicillin and could likely transition to oral amoxicillin to complete a 2-week course of antibiotic treatment.  Follow.   2.  New onset A. Fib CHADSVASC score 4 Patient noted to be a new onset A. fib on admission.  Likely secondary to problem #1.  TSH at 0.714.  2D echo with a EF of 55 to 60%, no wall motion abnormalities.  Patient seen in consultation by cardiology.  Patient started on amiodarone drip in addition to Coreg twice daily for rate control.  Coreg dose decreased per cardiology in order to use amiodarone to restore sinus rhythm per cardiology.  Patient noted to have prior history of CVA and per cardiology will likely require long-term anticoagulation.  Continue heparin for now.  Patient will likely need oral anticoagulation long-term.  Cardiology  following and appreciate input and recommendations.  3.  Peripheral vascular disease Patient with complaints of right calf and right ankle pain ongoing for a while.  ABI of 0.6 in 2017.  Continue statin.  Tobacco cessation stressed to patient.  Patient on anticoagulation with heparin.  Outpatient referral to vascular surgery.  4.  Tobacco abuse Tobacco  cessation.  Continue nicotine patch. Follow.  5.  Hyperlipidemia Continue statin.  7.  Coronary artery disease status post CABG November 2019 Stable.  Continue statin, Coreg.  Cardiology following.  8.  Acute renal failure Likely secondary to post renal azotemia secondary to obstruction and sepsis.  Suprapubic catheter fell out 2 days prior to admission and patient with complaints of abdominal pain and distention.  Suprapubic catheter placed stent with good urine output.  CT stone protocol concerning for pyelonephritis and left hydroureteronephrosis with mild left perinephric and periurethral fat stranding.  Foley catheter has decompressed bladder.  Patient with a urine output of 1.425 L over the past 24 hours.  Renal function trending down creatinine currently at 1.18 from 1.24 from 1.6 from 2.40 on admission.  Saline lock IV fluids. Follow.   9.  Acute urinary retention/dislodgment of suprapubic catheter Suprapubic catheter reinserted in the ED.  Good urine output of 1.425 L over the past 24 hours.  Renal function improved.  Saline lock IV fluids.  Monitor renal function.  Follow.  10.  History of embolic CVA Patient on anticoagulation with heparin secondary to new onset A. fib.  Continue statin.  Discontinue aspirin.  11.  Left inguinal hernia Patient noted to have left inguinal hernia descended into his left scrotal sac.  Patient states ongoing for the past 2 weeks with worsening when standing up coughing and also when trying to ambulate and reduces when laying down.  Per RN patient having bowel movements with no nausea or vomiting and no signs of obstruction at this time.  Patient seen in consultation by general surgery who I recommended elective hernia repair after patient has cleared his bacteremia/urosepsis and once he has quit smoking.  Outpatient follow-up with general surgery.  Appreciate surgery's input and recommendations.   12.  Hypokalemia K. Dur 40 mEq p.o. every 4 hours x2  doses.  Check a magnesium level.  Keep magnesium greater than 2.  13.  Anemia of chronic disease/vitamin B12 deficiency Likely dilutional component.  Patient with no overt bleeding.  FOBT has been ordered.  Anemia panel consistent with anemia of chronic disease.  Iron levels at 11, TIBC of 174, ferritin of 375, folate of 6.1.  Vitamin B12 levels of 154.  We will give a dose of IV Feraheme x1.  Will place on vitamin B12 1000 MCG's subcutaneously daily x1 week, and then weekly x1 month, and then monthly.  Will need outpatient follow-up with PCP.  14.  Cough Saline lock IV fluids.  Check a chest x-ray.  Hycodan as needed.  15.  COPD Stable.  Place on [place], [place].  Xopenex 3 times daily.  Tobacco cessation stressed to patient.   DVT prophylaxis: Heparin Code Status: Full Family Communication: Updated patient.  No family at bedside. Disposition Plan: To be determined   Consultants:   Cardiology: Dr. Meda Coffee 05/02/2019  General surgery: Dr. Windle Guard 05/03/2019  Procedures:   CT renal stone protocol 05/01/2019  Chest x-ray 05/01/2019  2D echo 05/02/2019  Antimicrobials:   IV Rocephin 05/01/2019>>>> 05/04/2019  IV ampicillin 05/05/2019   Subjective: Patient with complaints of cough overnight and this morning.  Patient denies any shortness of breath.  No chest pain.  Still with some left-sided CVA tenderness.  Having bowel movements.  Passing flatus.  Abdominal pain improved.  Objective: Vitals:   05/03/19 0422 05/03/19 1227 05/03/19 2039 05/04/19 0458  BP: 118/82 122/76 (!) 134/48 (!) 115/54  Pulse: 65 (!) 56 71 (!) 55  Resp: 18 16 18 20   Temp: 98.8 F (37.1 C) (!) 97.5 F (36.4 C) 98.3 F (36.8 C) 97.6 F (36.4 C)  TempSrc: Oral Oral Oral Oral  SpO2: 98% 98% 98% 100%  Weight:    57 kg  Height:        Intake/Output Summary (Last 24 hours) at 05/04/2019 1133 Last data filed at 05/04/2019 0600 Gross per 24 hour  Intake 2803.49 ml  Output 1425 ml  Net 1378.49 ml   Filed  Weights   05/01/19 1308 05/02/19 0451 05/04/19 0458  Weight: 59 kg 52.2 kg 57 kg    Examination:  General exam: NAD Respiratory system: CTAB.  No wheezes, no crackles, no rhonchi.  Speaking in full sentences.  Normal respiratory effort.   Cardiovascular system: Irregularly irregular.  No JVD.  No lower extremity edema.  Gastrointestinal system: Abdomen is nondistended, soft and bilateral CVA tenderneness left greater than right. No organomegaly or masses felt. Normal bowel sounds heard.  Suprapubic catheter in place. GU: Left inguinal hernia noted in scrotum. Central nervous system: Alert and oriented. No focal neurological deficits. Extremities: Symmetric 5 x 5 power. Skin: No rashes, lesions or ulcers Psychiatry: Judgement and insight appear normal. Mood & affect appropriate.     Data Reviewed: I have personally reviewed following labs and imaging studies  CBC: Recent Labs  Lab 05/01/19 1400 05/02/19 0136 05/03/19 0137 05/04/19 0411  WBC 31.9* 26.4* 18.0* 10.9*  NEUTROABS 28.5*  --  15.2* 8.2*  HGB 15.5 12.1* 10.0* 9.0*  HCT 45.3 37.1* 30.4* 28.1*  MCV 106.3* 108.5* 109.4* 109.3*  PLT 220 186 145* 154   Basic Metabolic Panel: Recent Labs  Lab 05/01/19 1400 05/02/19 0136 05/03/19 0137 05/04/19 0405 05/04/19 0411  NA 135 136 135  --  138  K 4.2 3.9 3.3*  --  3.4*  CL 96* 105 107  --  110  CO2 25 22 20*  --  20*  GLUCOSE 109* 99 114*  --  108*  BUN 37* 31* 21  --  15  CREATININE 2.40* 1.60* 1.24  --  1.18  CALCIUM 9.8 8.3* 7.9*  --  8.2*  MG  --  2.0  --  1.8  --    GFR: Estimated Creatinine Clearance: 46.3 mL/min (by C-G formula based on SCr of 1.18 mg/dL). Liver Function Tests: Recent Labs  Lab 05/01/19 1400 05/02/19 0136  AST 26 18  ALT 16 13  ALKPHOS 70 44  BILITOT 3.3* 1.8*  PROT 9.1* 6.3*  ALBUMIN 4.2 2.8*   No results for input(s): LIPASE, AMYLASE in the last 168 hours. No results for input(s): AMMONIA in the last 168 hours. Coagulation  Profile: Recent Labs  Lab 05/02/19 0136  INR 1.5*   Cardiac Enzymes: No results for input(s): CKTOTAL, CKMB, CKMBINDEX, TROPONINI in the last 168 hours. BNP (last 3 results) No results for input(s): PROBNP in the last 8760 hours. HbA1C: No results for input(s): HGBA1C in the last 72 hours. CBG: No results for input(s): GLUCAP in the last 168 hours. Lipid Profile: No results for input(s): CHOL, HDL, LDLCALC, TRIG, CHOLHDL, LDLDIRECT in the last 72 hours. Thyroid Function  Tests: Recent Labs    05/02/19 1149  TSH 0.714   Anemia Panel: Recent Labs    05/04/19 0411  VITAMINB12 154*  FOLATE 6.1  FERRITIN 375*  TIBC 174*  IRON 11*   Sepsis Labs: Recent Labs  Lab 05/01/19 1418 05/01/19 1645 05/02/19 0136 05/03/19 0137 05/04/19 0411  PROCALCITON  --   --  9.33 9.31 5.43  LATICACIDVEN 3.4* 4.8* 2.4* 1.6  --     Recent Results (from the past 240 hour(s))  Urine culture     Status: None   Collection Time: 05/01/19  2:20 PM   Specimen: Urine, Random  Result Value Ref Range Status   Specimen Description   Final    URINE, RANDOM Performed at [facility], [address] [place], [place] [address]    Special Requests   Final    NONE Performed at [facility], [address] [place], [place] [address]    Culture   Final    Multiple bacterial morphotypes present, none predominant. Suggest appropriate recollection if clinically indicated.   Report Status 05/03/2019 FINAL  Final  Blood culture (routine x 2)     Status: Abnormal   Collection Time: 05/01/19  2:37 PM   Specimen: BLOOD RIGHT ARM  Result Value Ref Range Status   Specimen Description   Final    BLOOD RIGHT ARM Performed at [facility], [address] [place], [place] [address]    Special Requests   Final    BOTTLES DRAWN AEROBIC AND ANAEROBIC Blood Culture adequate volume Performed at [facility], [address] [place], [place]  [address]    Culture  Setup Time (A)  Final    GRAM VARIABLE ROD IN BOTH AEROBIC AND ANAEROBIC BOTTLES CRITICAL RESULT CALLED TO, READ BACK BY AND VERIFIED WITH: PHARMD [facility] 1615 [phone] FCP Performed at [facility], [address] [place], [place] [address]    Culture PROTEUS MIRABILIS (A)  Final   Report Status 05/04/2019 FINAL  Final   Organism ID, Bacteria PROTEUS MIRABILIS  Final      Susceptibility   Proteus mirabilis - MIC*    AMPICILLIN <=2 SENSITIVE Sensitive     CEFAZOLIN <=4 SENSITIVE Sensitive     CEFEPIME <=1 SENSITIVE Sensitive     CEFTAZIDIME <=1 SENSITIVE Sensitive     CEFTRIAXONE <=1 SENSITIVE Sensitive     CIPROFLOXACIN <=0.25 SENSITIVE Sensitive     GENTAMICIN <=1 SENSITIVE Sensitive     IMIPENEM 2 SENSITIVE Sensitive     TRIMETH/SULFA <=20 SENSITIVE Sensitive     AMPICILLIN/SULBACTAM <=2 SENSITIVE Sensitive     PIP/TAZO <=4 SENSITIVE Sensitive     * PROTEUS MIRABILIS  Blood Culture ID Panel (Reflexed)     Status: Abnormal   Collection Time: 05/01/19  2:37 PM  Result Value Ref Range Status   Enterococcus species NOT DETECTED NOT DETECTED Final   Listeria monocytogenes NOT DETECTED NOT DETECTED Final   Staphylococcus species NOT DETECTED NOT DETECTED Final   Staphylococcus aureus (BCID) NOT DETECTED NOT DETECTED Final   Streptococcus species NOT DETECTED NOT DETECTED Final   Streptococcus agalactiae NOT DETECTED NOT DETECTED Final   Streptococcus pneumoniae NOT DETECTED NOT DETECTED Final   Streptococcus pyogenes NOT DETECTED NOT DETECTED Final   Acinetobacter baumannii NOT DETECTED NOT DETECTED Final   Enterobacteriaceae species DETECTED (A) NOT DETECTED Final    Comment: Enterobacteriaceae represent a large family of gram-negative bacteria, not a single organism.  CRITICAL RESULT CALLED TO, READ BACK BY AND VERIFIED WITH: PHARMD [facility] 1615 [phone] FCP    Enterobacter cloacae complex NOT DETECTED NOT DETECTED Final   Escherichia coli NOT DETECTED  NOT DETECTED Final   Klebsiella oxytoca NOT DETECTED NOT DETECTED Final   Klebsiella pneumoniae NOT DETECTED NOT DETECTED Final   Proteus species DETECTED (A) NOT DETECTED Final    Comment: CRITICAL RESULT CALLED TO, READ BACK BY AND VERIFIED WITH: PHARMD [facility] 1615 [id number] FCP    Serratia marcescens NOT DETECTED NOT DETECTED Final   Carbapenem resistance NOT DETECTED NOT DETECTED Final   Haemophilus influenzae NOT DETECTED NOT DETECTED Final   Neisseria meningitidis NOT DETECTED NOT DETECTED Final   Pseudomonas aeruginosa NOT DETECTED NOT DETECTED Final   Candida albicans NOT DETECTED NOT DETECTED Final   Candida glabrata NOT DETECTED NOT DETECTED Final   Candida krusei NOT DETECTED NOT DETECTED Final   Candida parapsilosis NOT DETECTED NOT DETECTED Final   Candida tropicalis NOT DETECTED NOT DETECTED Final    Comment: Performed at [facility], [address] [place], [place] [address]  Blood culture (routine x 2)     Status: Abnormal   Collection Time: 05/01/19  3:15 PM   Specimen: BLOOD  Result Value Ref Range Status   Specimen Description   Final    BLOOD LEFT ANTECUBITAL Performed at [facility], [address] [place], [place] [address]    Special Requests   Final    BOTTLES DRAWN AEROBIC AND ANAEROBIC Blood Culture adequate volume Performed at [facility], [address] [place], [place] [address]    Culture  Setup Time   Final    GRAM NEGATIVE RODS IN BOTH AEROBIC AND ANAEROBIC BOTTLES CRITICAL VALUE NOTED.  VALUE IS CONSISTENT WITH PREVIOUSLY REPORTED AND CALLED VALUE.    Culture (A)  Final    PROTEUS MIRABILIS SUSCEPTIBILITIES PERFORMED ON PREVIOUS CULTURE WITHIN THE LAST 5 DAYS. Performed at [facility], [address] [place], [place] [address]    Report Status 05/04/2019 FINAL  Final  SARS Coronavirus 2 (CEPHEID- Performed in [facility] lab), Hosp Order     Status: None   Collection Time:  05/01/19  4:55 PM   Specimen: Nasopharyngeal Swab  Result Value Ref Range Status   SARS Coronavirus 2 NEGATIVE NEGATIVE Final    Comment: (NOTE) If result is NEGATIVE SARS-CoV-2 target nucleic acids are NOT DETECTED. The SARS-CoV-2 RNA is generally detectable in upper and lower  respiratory specimens during the acute phase of infection. The lowest  concentration of SARS-CoV-2 viral copies this assay can detect is 250  copies / mL. A negative result does not preclude SARS-CoV-2 infection  and should not be used as the sole basis for treatment or other  patient management decisions.  A negative result may occur with  improper specimen collection / handling, submission of specimen other  than nasopharyngeal swab, presence of viral mutation(s) within the  areas targeted by this assay, and inadequate number of viral copies  (<250 copies / mL). A negative result must be combined with clinical  observations, patient history, and epidemiological information. If result is POSITIVE SARS-CoV-2 target nucleic acids are DETECTED. The SARS-CoV-2 RNA is generally detectable in upper and lower  respiratory specimens dur ing the acute phase of infection.  Positive  results are indicative of active infection with SARS-CoV-2.  Clinical  correlation with patient history and other diagnostic information is  necessary to determine  patient infection status.  Positive results do  not rule out bacterial infection or co-infection with other viruses. If result is PRESUMPTIVE POSTIVE SARS-CoV-2 nucleic acids MAY BE PRESENT.   A presumptive positive result was obtained on the submitted specimen  and confirmed on repeat testing.  While 2019 novel coronavirus  (SARS-CoV-2) nucleic acids may be present in the submitted sample  additional confirmatory testing may be necessary for epidemiological  and / or clinical management purposes  to differentiate between  SARS-CoV-2 and other Sarbecovirus currently known to  infect humans.  If clinically indicated additional testing with an alternate test  methodology [phone] is advised. The SARS-CoV-2 RNA is generally  detectable in upper and lower respiratory sp ecimens during the acute  phase of infection. The expected result is Negative. Fact Sheet for Patients:  BoilerBrush.com.cyhttps://www.fda.gov/media/136312/download Fact Sheet for Healthcare Providers: https://pope.com/https://www.fda.gov/media/136313/download This test is not yet approved or cleared by the [place] FDA and has been authorized for detection and/or diagnosis of SARS-CoV-2 by FDA under an Emergency Use Authorization (EUA).  This EUA will remain in effect (meaning this test can be used) for the duration of the COVID-19 declaration under Section 564(b)(1) of the Act, 21 U.S.C. section 360bbb-3(b)(1), unless the authorization is terminated or revoked sooner. Performed at [facility], [address] [place], [place] [address]   Culture, Urine     Status: Abnormal   Collection Time: 05/03/19 12:44 PM   Specimen: Urine, Random  Result Value Ref Range Status   Specimen Description   Final    URINE, RANDOM Performed at [facility], [address] [place], [place] [address]    Special Requests   Final    NONE Performed at [facility], [address] [place], [place] [address]    Culture (A)  Final    <10,000 COLONIES/mL INSIGNIFICANT GROWTH Performed at [facility], [address] [place], [place] [address]    Report Status 05/04/2019 FINAL  Final         Radiology Studies: No results found.      Scheduled Meds: . aspirin EC  81 mg Oral Daily  . atorvastatin  80 mg Oral Daily  . carvedilol  3.125 mg Oral BID  . cyanocobalamin  1,000 mcg Subcutaneous Daily  . potassium chloride  40 mEq Oral Once  . sodium chloride flush  3 mL Intravenous Q12H   Continuous Infusions: . cefTRIAXone (ROCEPHIN)  IV 2 g (05/03/19 1411)      LOS: 3 days    Time spent: 40 minutes    Ramiro Harvestaniel , MD [facility]  If 7PM-7AM, please contact night-coverage [email or web address] 05/04/2019, 11:33 AM

## 2019-05-04 NOTE — Progress Notes (Signed)
[place] for heparin Indication: new onset atrial fibrillation  Allergies  Allergen Reactions  . Lactose Intolerance (Gi) Diarrhea    Patient Measurements: Height: 5\' 8"  (172.7 cm) Weight: 125 lb 10.6 oz (57 kg) IBW/kg (Calculated) : 68.4 Heparin Dosing Weight: 59 kg  Vital Signs: Temp: 97.6 F (36.4 C) (06/29 0458) Temp Source: Oral (06/29 0458) BP: 115/54 (06/29 0458) Pulse Rate: 55 (06/29 0458)  Labs: Recent Labs    05/02/19 0136  05/03/19 0137 05/03/19 1100 05/03/19 2100 05/04/19 0411  HGB 12.1*  --  10.0*  --   --  9.0*  HCT 37.1*  --  30.4*  --   --  28.1*  PLT 186  --  145*  --   --  154  LABPROT 18.0*  --   --   --   --   --   INR 1.5*  --   --   --   --   --   HEPARINUNFRC 0.24*   < > 0.50 0.27* 0.38 2.00*  CREATININE 1.60*  --  1.24  --   --  1.18   < > = values in this interval not displayed.    Estimated Creatinine Clearance: 46.3 mL/min (by C-G formula based on SCr of 1.18 mg/dL).   Assessment: Patient's a 73 y.o M with hx recurrent renal stones and hematuria and CABG presented to the ED on 6/26 with dislodged suprapubic cath and c/o abd pain. He was found to have new onset afib. To start heparin drip for afib.   Baseline INR slightly elevated  Prior anticoagulation: none  Significant events:  6/28  CBC: Hgb down after admission, suspect hemodilution  Most recent heparin level again SUBtherapeutic after rising to 0.5 previously; RN reports no issues with site, line, or pump that would result in falsely low levels  No hematuria or other bleeding per RN  2100 HL = 0.38 at goal, no issues reported by RN.  Today, 6/29  0515 HL = 2 ???above goal, RN reports no bleeding she did recently change tubing and pump because it was beeping a lot. HL was drawn from opposite side. Assume previous IV site was not good and when she changed over he started getting more heparin?  Goal of Therapy: Heparin level 0.3-0.7  units/ml Monitor platelets by anticoagulation protocol: Yes  Plan:  Hold heparin drip x 1 hour   Resume heparin drip at 900 units/hr   Check heparin level 8 hrs after rate change  Daily CBC, daily heparin level once stable  Monitor for signs of bleeding or thrombosis   Dorrene German 05/04/2019, 5:44 AM

## 2019-05-04 NOTE — Progress Notes (Addendum)
Progress Note  Patient Name: Joe Durham Date of Encounter: 05/04/2019  Primary Cardiologist: Thurmon FairMihai Croitoru, MD  Subjective   Pt reports having fever overnight but I do not see this documented, all temps were normal. No chills. Just waking up so "I can't really tell you how I feel." Denies any CP or SOB.  Inpatient Medications    Scheduled Meds: . aspirin EC  81 mg Oral Daily  . atorvastatin  80 mg Oral Daily  . carvedilol  3.125 mg Oral BID  . cyanocobalamin  1,000 mcg Subcutaneous Daily  . potassium chloride  40 mEq Oral Once  . sodium chloride flush  3 mL Intravenous Q12H   Continuous Infusions: . amiodarone 30 mg/hr (05/04/19 0527)  . cefTRIAXone (ROCEPHIN)  IV 2 g (05/03/19 1411)  . heparin 900 Units/hr (05/04/19 0739)   PRN Meds: acetaminophen **OR** acetaminophen, hydrALAZINE, HYDROcodone-acetaminophen, ondansetron **OR** ondansetron (ZOFRAN) IV, sodium chloride flush   Vital Signs    Vitals:   05/03/19 0422 05/03/19 1227 05/03/19 2039 05/04/19 0458  BP: 118/82 122/76 (!) 134/48 (!) 115/54  Pulse: 65 (!) 56 71 (!) 55  Resp: 18 16 18 20   Temp: 98.8 F (37.1 C) (!) 97.5 F (36.4 C) 98.3 F (36.8 C) 97.6 F (36.4 C)  TempSrc: Oral Oral Oral Oral  SpO2: 98% 98% 98% 100%  Weight:    57 kg  Height:        Intake/Output Summary (Last 24 hours) at 05/04/2019 0917 Last data filed at 05/04/2019 0600 Gross per 24 hour  Intake 2803.49 ml  Output 1425 ml  Net 1378.49 ml   Last 3 Weights 05/04/2019 05/02/2019 05/01/2019  Weight (lbs) 125 lb 10.6 oz 115 lb 1.3 oz 130 lb  Weight (kg) 57 kg 52.2 kg 58.968 kg     Telemetry    Sinus bradycardia with PACs versus 2nd degree AV block type 1 - Personally Reviewed  ECG    Will order - Personally Reviewed  Physical Exam   GEN: No acute distress.  HEENT: Normocephalic, atraumatic, sclera non-icteric. Neck: No JVD or bruits. Cardiac: RRR occ skipped beats, no murmurs, rubs, or gallops.  Radials/DP/PT 1+ and  equal bilaterally.  Respiratory: Mildly diminished throughout but otherwise clear to auscultation bilaterally. Breathing is unlabored. GI: Soft, nontender, non-distended, BS +x 4. MS: no deformity. Extremities: No clubbing or cyanosis. No edema. Distal pedal pulses are 2+ and equal bilaterally. Neuro:  AAOx3. Follows commands. Psych:  Responds to questions appropriately with a normal affect.  Labs    Chemistry Recent Labs  Lab 05/01/19 1400 05/02/19 0136 05/03/19 0137 05/04/19 0411  NA 135 136 135 138  K 4.2 3.9 3.3* 3.4*  CL 96* 105 107 110  CO2 25 22 20* 20*  GLUCOSE 109* 99 114* 108*  BUN 37* 31* 21 15  CREATININE 2.40* 1.60* 1.24 1.18  CALCIUM 9.8 8.3* 7.9* 8.2*  PROT 9.1* 6.3*  --   --   ALBUMIN 4.2 2.8*  --   --   AST 26 18  --   --   ALT 16 13  --   --   ALKPHOS 70 44  --   --   BILITOT 3.3* 1.8*  --   --   GFRNONAA 26* 43* 58* >60  GFRAA 30* 49* >60 >60  ANIONGAP 14 9 8 8      Hematology Recent Labs  Lab 05/02/19 0136 05/03/19 0137 05/04/19 0411  WBC 26.4* 18.0* 10.9*  RBC 3.42* 2.78* 2.57*  HGB 12.1* 10.0* 9.0*  HCT 37.1* 30.4* 28.1*  MCV 108.5* 109.4* 109.3*  MCH 35.4* 36.0* 35.0*  MCHC 32.6 32.9 32.0  RDW 14.8 14.7 14.7  PLT 186 145* 154    Cardiac EnzymesNo results for input(s): TROPONINI in the last 168 hours. No results for input(s): TROPIPOC in the last 168 hours.   BNPNo results for input(s): BNP, PROBNP in the last 168 hours.   DDimer No results for input(s): DDIMER in the last 168 hours.   Radiology    No results found.  Cardiac Studies   2D echo 05/02/19 IMPRESSIONS  1. The left ventricle has normal systolic function, with an ejection fraction of 55-60%. The cavity size was normal. Left ventricular diastolic Doppler parameters are consistent with pseudonormalization. No evidence of left ventricular regional wall  motion abnormalities.  2. The right ventricle has normal systolic function. The cavity was normal. There is no increase  in right ventricular wall thickness.  3. Left atrial size was mildly dilated.  4. Right atrial size was mildly dilated.  5. No evidence of mitral valve stenosis. Trivial mitral regurgitation.  6. The aortic valve is tricuspid. Mild calcification of the aortic valve. No stenosis of the aortic valve.  7. The aortic root is normal in size and structure.  8. Normal IVC size with PA systolic pressure 36 mmHg.  Patient Profile     73 y.o. 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  Assessment & Plan    1. Sepsis due to complicated UTI/pyelonephritis with proteus bacteremia - AKI felt due to obstruction and sepsis. Suprapubic catheter fell out 2 days prior to admission and patient with complaints of abdominal pain and distention. Procalcitonin remains high but downtrending. Pt reports fever last night but do not see documented. Further per IM.  2. Irregular rhythm - this was felt to represent atrial fib although on telemetry all I currently see is sinus rhythm with variable PR interval either representing NSR/SB with PACs or 2nd degree AV block type 1 with intermittent WAP as well. HR is controlled. First EKG was not sufficient enough of quality to diagnose atrial fib therefore will clarify diagnosis with cardiology MD. Pt remains on amio and heparin gtt as well as carvedilol and ASA. Note that Hgb has trended down significantly from 15 to 12 to 9 today.  3. Macrocytic anemia - Hgb 12.8 in 12/2018. Was 15.5 on admission but in setting of concentration likely due to sepsis. This has downtrended from 12->9 and needs futher input from IM while inpatient especially given need for anticoagulation going  forward. Iron studies appear to be a mixed picture and B12 is low.  4. CAD s/p CABG - no CP reported, but EKG showed significant ST-T changes in precordial leads with QT prolongation. Will review with MD whether troponin testing is prudent. F/u EKG today.  5. PAD - planned for updated vascular testing as OP and f/u with Dr .Allyson Sabal right before Covid shut-down. Will need f/u arranged post-hospital.   6. Tobacco abuse - cessation recommended.  7. Reducible inguinal hernia - seen by general surgery who recommended to recover from acute sepsis and quit smoking then revisit elective surgery as OP.  8. Hypokalemia - being repleted by primary team. Mg OK this admission.  9. RLL pulmonary nodule - noted on imaging with recommendations made for close 3 month f/u with further eval. Will need OP f/u pulm for this.  For questions or updates, please contact [place] Please consult [email or web address] for contact info under Cardiology/STEMI.  Signed, Charlie Pitter, PA-C 05/04/2019, 9:17 AM    The patient was seen, examined and discussed with Melina Copa, PA-C and I agree with the above.   The patient cardioverted from atrial fibrillation to second-degree AV block type I Wenckebach episodes, I will discontinue amiodarone and continue low-dose carvedilol 3.125 p.o. twice daily only.  We can also transition heparin to Eliquis.  Ena Dawley, MD 05/04/2019

## 2019-05-05 DIAGNOSIS — I441 Atrioventricular block, second degree: Secondary | ICD-10-CM

## 2019-05-05 DIAGNOSIS — I480 Paroxysmal atrial fibrillation: Secondary | ICD-10-CM

## 2019-05-05 DIAGNOSIS — R6520 Severe sepsis without septic shock: Secondary | ICD-10-CM

## 2019-05-05 LAB — BASIC METABOLIC PANEL
Anion gap: 7 (ref 5–15)
BUN: 10 mg/dL (ref 8–23)
CO2: 21 mmol/L — ABNORMAL LOW (ref 22–32)
Calcium: 8.7 mg/dL — ABNORMAL LOW (ref 8.9–10.3)
Chloride: 110 mmol/L (ref 98–111)
Creatinine, Ser: 1.16 mg/dL (ref 0.61–1.24)
GFR calc Af Amer: >60 mL/min (ref >60)
GFR calc non Af Amer: >60 mL/min (ref >60)
Glucose, Bld: 93 mg/dL (ref 70–99)
Potassium: 3.8 mmol/L (ref 3.5–5.1)
Sodium: 138 mmol/L (ref 135–145)

## 2019-05-05 LAB — CBC WITH DIFFERENTIAL/PLATELET
Abs Immature Granulocytes: 0.05 10*3/uL (ref 0.00–0.07)
Basophils Absolute: 0 10*3/uL (ref 0.0–0.1)
Basophils Relative: 0 %
Eosinophils Absolute: 0.2 10*3/uL (ref 0.0–0.5)
Eosinophils Relative: 2 %
HCT: 27.8 % — ABNORMAL LOW (ref 39.0–52.0)
Hemoglobin: 8.8 g/dL — ABNORMAL LOW (ref 13.0–17.0)
Immature Granulocytes: 1 %
Lymphocytes Relative: 8 %
Lymphs Abs: 0.8 10*3/uL (ref 0.7–4.0)
MCH: 34.5 pg — ABNORMAL HIGH (ref 26.0–34.0)
MCHC: 31.7 g/dL (ref 30.0–36.0)
MCV: 109 fL — ABNORMAL HIGH (ref 80.0–100.0)
Monocytes Absolute: 1.9 10*3/uL — ABNORMAL HIGH (ref 0.1–1.0)
Monocytes Relative: 19 %
Neutro Abs: 6.7 10*3/uL (ref 1.7–7.7)
Neutrophils Relative %: 70 %
Platelets: 186 10*3/uL (ref 150–400)
RBC: 2.55 MIL/uL — ABNORMAL LOW (ref 4.22–5.81)
RDW: 14.7 % (ref 11.5–15.5)
WBC: 9.7 10*3/uL (ref 4.0–10.5)
nRBC: 0 % (ref 0.0–0.2)

## 2019-05-05 LAB — MAGNESIUM: Magnesium: 2.1 mg/dL (ref 1.7–2.4)

## 2019-05-05 MED ORDER — LOSARTAN POTASSIUM 25 MG PO TABS
25.0000 mg | ORAL_TABLET | Freq: Every day | ORAL | Status: DC
  Administered 2019-05-05 – 2019-05-06 (×2): 25 mg via ORAL
  Filled 2019-05-05 (×2): qty 1

## 2019-05-05 MED ORDER — PANTOPRAZOLE SODIUM 40 MG PO TBEC
40.0000 mg | DELAYED_RELEASE_TABLET | Freq: Every day | ORAL | Status: DC
  Administered 2019-05-05 – 2019-05-06 (×2): 40 mg via ORAL
  Filled 2019-05-05 (×2): qty 1

## 2019-05-05 MED ORDER — POTASSIUM CHLORIDE 20 MEQ PO PACK
20.0000 meq | PACK | Freq: Once | ORAL | Status: AC
  Administered 2019-05-05: 20 meq via ORAL
  Filled 2019-05-05: qty 1

## 2019-05-05 MED ORDER — LIDOCAINE VISCOUS HCL 2 % MT SOLN: 15.0000 mL | Freq: Four times a day (QID) | OROMUCOSAL | Status: DC | PRN

## 2019-05-05 MED ORDER — ALUM & MAG HYDROXIDE-SIMETH 200-200-20 MG/5ML PO SUSP: 30.0000 mL | Freq: Four times a day (QID) | ORAL | Status: DC | PRN

## 2019-05-05 MED ORDER — POTASSIUM CHLORIDE CRYS ER 20 MEQ PO TBCR
20.0000 meq | EXTENDED_RELEASE_TABLET | Freq: Once | ORAL | Status: DC
  Administered 2019-05-05: 20 meq via ORAL

## 2019-05-05 MED ORDER — BENZONATATE 100 MG PO CAPS
200.0000 mg | ORAL_CAPSULE | Freq: Three times a day (TID) | ORAL | Status: DC
  Administered 2019-05-05 – 2019-05-06 (×4): 200 mg via ORAL
  Filled 2019-05-05 (×4): qty 2

## 2019-05-05 MED ORDER — METOPROLOL TARTRATE 25 MG PO TABS
12.5000 mg | ORAL_TABLET | Freq: Two times a day (BID) | ORAL | Status: DC
  Administered 2019-05-06: 12.5 mg via ORAL
  Filled 2019-05-05 (×2): qty 1

## 2019-05-05 NOTE — Evaluation (Signed)
Physical Therapy One Time Evaluation Patient Details Name: Joe Durham MRN: [id number] DOB: 03/27/1946 Today's Date: 05/05/2019   History of Present Illness  73 y.o. male with medical history significant of recurrent renal stones and hematuria, CAD status post CABG x3, hypertension and hyperlipidemia, PAT, COPD presented to ER with a complaint of abdominal pressure and dislodging of suprapubic catheter. Pt admitted for sepsis secondary to complicated urinary tract infection, pyelonephritis, Proteus bacteremia, POA    Clinical Impression  Patient evaluated by Physical Therapy with no further acute PT needs identified. All education has been completed and the patient has no further questions.  Pt able to ambulate 200 feet with RW and supervision on evaluation.  Pt reports using SPC or RW at baseline.  Pt reports mobility at home limited, mostly remains in home and reports history of chronic right ankle pain.  Ankle pain present with ambulation and RN notified.  No further follow up Physical Therapy or equipment needs. PT is signing off. Thank you for this referral.        Follow Up Recommendations No PT follow up    Equipment Recommendations  None recommended by PT    Recommendations for Other Services       Precautions / Restrictions Precautions Precautions: Fall      Mobility  Bed Mobility Overal bed mobility: Modified Independent                Transfers Overall transfer level: Needs assistance Equipment used: Rolling walker (2 wheeled) Transfers: Sit to/from Stand Sit to Stand: Supervision         General transfer comment: supervision for safety  Ambulation/Gait Ambulation/Gait assistance: Supervision;Min guard Gait Distance (Feet): 200 Feet Assistive device: Rolling walker (2 wheeled) Gait Pattern/deviations: Step-through pattern;Decreased stride length;Narrow base of support     General Gait Details: pt reports R ankle pain with weight bearing (states  history intermittent, chronic for 2 years) however tolerated good distance, steady with RW, HR 68 bpm during ambulation  Stairs            Wheelchair Mobility    Modified Rankin (Stroke Patients Only)       Balance Overall balance assessment: Needs assistance(denies any recent falls)         Standing balance support: Bilateral upper extremity supported Standing balance-Leahy Scale: Poor Standing balance comment: required UE support today                             Pertinent Vitals/Pain Pain Assessment: 0-10 Pain Score: 7  Pain Location: right medial ankle Pain Descriptors / Indicators: Sore;Pressure Pain Intervention(s): Monitored during session;Repositioned;Patient requesting pain meds-RN notified    Home Living Family/patient expects to be discharged to:: Private residence Living Arrangements: Alone Available Help at Discharge: Family;Available PRN/intermittently Type of Home: Apartment Home Access: Stairs to enter Entrance Stairs-Rails: Right;Left Entrance Stairs-Number of Steps: 17 Home Layout: One level Home Equipment: Walker - 2 wheels;Cane - single point      Prior Function Level of Independence: Independent with assistive device(s)         Comments: pt reports his niece assists him to grocery store and sometimes cooks however he is able to perform his own ADLs, typically ambulates with SPC or RW     Hand Dominance        Extremity/Trunk Assessment   Upper Extremity Assessment Upper Extremity Assessment: Overall WFL for tasks assessed    Lower Extremity Assessment Lower Extremity Assessment:  Overall [facility] for tasks assessed    Cervical / Trunk Assessment Cervical / Trunk Assessment: Normal  Communication   Communication: No difficulties  Cognition Arousal/Alertness: Awake/alert Behavior During Therapy: WFL for tasks assessed/performed Overall Cognitive Status: Within Functional Limits for tasks assessed                                         General Comments      Exercises     Assessment/Plan    PT Assessment [place] does not need any further PT services  PT Problem List         PT Treatment Interventions      PT Goals (Current goals can be found in the Care Plan section)  Acute Rehab PT Goals PT Goal Formulation: All assessment and education complete, DC therapy    Frequency     Barriers to discharge        Co-evaluation               AM-PAC PT "6 Clicks" Mobility  Outcome Measure Help needed turning from your back to your side while in a flat bed without using bedrails?: None Help needed moving from lying on your back to sitting on the side of a flat bed without using bedrails?: None Help needed moving to and from a bed to a chair (including a wheelchair)?: None Help needed standing up from a chair using your arms (e.g., wheelchair or bedside chair)?: A Little Help needed to walk in hospital room?: A Little Help needed climbing 3-5 steps with a railing? : A Little 6 Click Score: 21    End of Session   Activity Tolerance: Patient tolerated treatment well Patient left: in bed;with bed alarm set;with call bell/phone within reach Nurse Communication: Mobility status;Patient requests pain meds PT Visit Diagnosis: Difficulty in walking, not elsewhere classified (R26.2)    Time: 7124-5809 PT Time Calculation (min) (ACUTE ONLY): 16 min   Charges:   PT Evaluation $PT Eval Low Complexity: [place], PT, DPT Acute Rehabilitation Services Office: [phone] Pager: [phone]  Trena Platt 05/05/2019, 4:10 PM

## 2019-05-05 NOTE — Progress Notes (Addendum)
Progress Note  Patient Name: Joe Durham Date of Encounter: 05/05/2019  Primary Cardiologist: Sanda Klein, MD  Subjective   Still with persistent dry cough. No CP, SOB, dizziness, edema. Some wheezing noted.  Inpatient Medications    Scheduled Meds: . apixaban  5 mg Oral BID  . atorvastatin  80 mg Oral Daily  . carvedilol  3.125 mg Oral BID  . cyanocobalamin  1,000 mcg Subcutaneous Daily  . mometasone-formoterol  2 puff Inhalation BID  . sodium chloride flush  3 mL Intravenous Q12H  . umeclidinium bromide  1 puff Inhalation Daily   Continuous Infusions: . ampicillin (OMNIPEN) IV 2 g (05/05/19 0507)   PRN Meds: acetaminophen **OR** acetaminophen, hydrALAZINE, HYDROcodone-acetaminophen, HYDROcodone-homatropine, levalbuterol, ondansetron **OR** ondansetron (ZOFRAN) IV, sodium chloride flush   Vital Signs    Vitals:   05/04/19 1315 05/04/19 1944 05/04/19 2123 05/05/19 0424  BP: 121/65  (!) 143/58 (!) 149/82  Pulse: 68  72 78  Resp: 17   18  Temp: 97.9 F (36.6 C)  98.4 F (36.9 C) 97.9 F (36.6 C)  TempSrc: Oral  Oral Oral  SpO2: 100% 94% 94% 99%  Weight:    57.3 kg  Height:        Intake/Output Summary (Last 24 hours) at 05/05/2019 0905 Last data filed at 05/05/2019 0600 Gross per 24 hour  Intake 752.39 ml  Output 1050 ml  Net -297.61 ml   Last 3 Weights 05/05/2019 05/04/2019 05/02/2019  Weight (lbs) 126 lb 6.4 oz 125 lb 10.6 oz 115 lb 1.3 oz  Weight (kg) 57.335 kg 57 kg 52.2 kg     Telemetry    NSR with second degree AVB type 1, also brief run of atrial tach versus junctional tach - Personally Reviewed  ECG    Appears to show NSR 2nd degree AV block type 1 with deep anterior TWI and QT prolongation - Personally Reviewed  Physical Exam   GEN: No acute distress.  HEENT: Normocephalic, atraumatic, sclera non-icteric. Neck: No JVD or bruits. Cardiac: RRR no murmurs, rubs, or gallops.  Radials/DP/PT 1+ and equal bilaterally.  Respiratory: Diffuse  BS throughout, mild wheezing on inspiration. Breathing is unlabored. GI: Soft, nontender, non-distended, BS +x 4. MS: no deformity. Extremities: No clubbing or cyanosis. No edema. Distal pedal pulses are 2+ and equal bilaterally. Neuro:  AAOx3. Follows commands. Psych:  Responds to questions appropriately with a normal affect.  Labs    Chemistry Recent Labs  Lab 05/01/19 1400 05/02/19 0136 05/03/19 0137 05/04/19 0411 05/05/19 0500  NA 135 136 135 138 138  K 4.2 3.9 3.3* 3.4* 3.8  CL 96* 105 107 110 110  CO2 25 22 20* 20* 21*  GLUCOSE 109* 99 114* 108* 93  BUN 37* 31* 21 15 10   CREATININE 2.40* 1.60* 1.24 1.18 1.16  CALCIUM 9.8 8.3* 7.9* 8.2* 8.7*  PROT 9.1* 6.3*  --   --   --   ALBUMIN 4.2 2.8*  --   --   --   AST 26 18  --   --   --   ALT 16 13  --   --   --   ALKPHOS 70 44  --   --   --   BILITOT 3.3* 1.8*  --   --   --   GFRNONAA 26* 43* 58* >60 >60  GFRAA 30* 49* >60 >60 >60  ANIONGAP 14 9 8 8 7      Hematology Recent Labs  Lab 05/03/19 0137 05/04/19  0411 05/05/19 0500  WBC 18.0* 10.9* 9.7  RBC 2.78* 2.57* 2.55*  HGB 10.0* 9.0* 8.8*  HCT 30.4* 28.1* 27.8*  MCV 109.4* 109.3* 109.0*  MCH 36.0* 35.0* 34.5*  MCHC 32.9 32.0 31.7  RDW 14.7 14.7 14.7  PLT 145* 154 186    Cardiac EnzymesNo results for input(s): TROPONINI in the last 168 hours. No results for input(s): TROPIPOC in the last 168 hours.   BNPNo results for input(s): BNP, PROBNP in the last 168 hours.   DDimer No results for input(s): DDIMER in the last 168 hours.   Radiology    Dg Chest 2 View  Result Date: 05/04/2019 CLINICAL DATA:  Cough EXAM: CHEST - 2 VIEW COMPARISON:  05/01/2019 FINDINGS: Cardiac shadow is mildly prominent accentuated by the portable technique. Postsurgical changes are noted. Loop recorder is seen. Lungs are hyperinflated without focal infiltrate. No sizable effusion is seen. No bony abnormality is noted. IMPRESSION: COPD without acute abnormality. Electronically Signed    By: Alcide CleverMark  Lukens M.D.   On: 05/04/2019 14:15    Cardiac Studies   2D echo 05/02/19 IMPRESSIONS 1. The left ventricle has normal systolic function, with an ejection fraction of 55-60%. The cavity size was normal. Left ventricular diastolic Doppler parameters are consistent with pseudonormalization. No evidence of left ventricular regional wall  motion abnormalities. 2. The right ventricle has normal systolic function. The cavity was normal. There is no increase in right ventricular wall thickness. 3. Left atrial size was mildly dilated. 4. Right atrial size was mildly dilated. 5. No evidence of mitral valve stenosis. Trivial mitral regurgitation. 6. The aortic valve is tricuspid. Mild calcification of the aortic valve. No stenosis of the aortic valve. 7. The aortic root is normal in size and structure. 8. Normal IVC size with PA systolic pressure 36 mmHg.  Patient Profile     73 y.o. 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  Assessment & Plan    1. Sepsis due to complicated UTI/pyelonephritis with proteus bacteremia - AKI felt due to obstruction and sepsis. Suprapubic catheter fell out 2 days prior to admission and patient had abdominal pain and distention. Cr improved.  2. Irregular rhythm - per Dr. Delton SeeNelson, pt reported to have atrial fib earlier this admission. Most recent telemetry shows NSR/SB with type 1 second degree AV block and WAP as well as possible run of atrial tach vs junctional tach. Amiodarone discontinued yesterday. He is on lower dose of Coreg now than he was at home. Consider switching to metoprolol given scattered  wheezing. Need to be cautious in general with AVN blocking agents given conduction disease.  3. Macrocytic anemia - Hgb 12.8 in 12/2018, 15.5 on admission (likely due to concentration due to sepsis) -> 12 -> 9 -> 8.8. FOBT negative for blood. Iron studies with mixed picture and B12 low. Started on vitamin B12 supplementation and given dose of IV feraheme. Will need OP f/u.  4. CAD s/p CABG - no CP reported, but EKG showed significant ST-T changes in precordial leads with QT prolongation this admission -> persisted on EKG yesterday. Will review with MD whether troponin testing is prudent. F/u EKG today.  Amiodarone discontinued.  5. PAD - planned for updated vascular testing as OP and f/u with Dr .Allyson Sabal but patient no showed in 02/2019. I sent our [facility] office a message for them to arrange vascular testing/appt with Dr. Allyson Sabal and to call patient with appt info.  6. Tobacco abuse - cessation recommended.  7. Reducible inguinal hernia - seen by general surgery who recommended to recover from acute sepsis and quit smoking then revisit elective surgery as OP.  8. Hypokalemia - repleted by primary team, 3.8 today. Will give another to keep K 4.0 or greater given QT prolongation.  9. RLL pulmonary nodule - noted on imaging with recommendations made for close 3 month f/u with further eval. Will need OP f/u pulm for this.  10. Dry cough  - per IM. Would continue to hold ACE for now. Appears euvolemic. Consider changing BB to more selective agent.  For questions or updates, please contact [facility] Please consult [email or web address] for contact info under Cardiology/STEMI.  Signed, Laurann [place], PA-C 05/05/2019, 9:05 AM    The patient was seen, examined and discussed with Ronie Spies, PA-C and I agree with the above.   The patient is now in Wenckebach periods, considering his wheezing I will switch carvedilol to metoprolol 12.5 p.o. twice daily, he is slightly hypertensive I will add losartan 25  mg daily, hold off ACE inhibitors as he is having cough.  His QT is just borderline we will follow.  Tobias Alexander, MD 05/05/2019

## 2019-05-05 NOTE — Progress Notes (Signed)
PROGRESS NOTE    Joe Durham  OHF:[id number] DOB: 12-04-1945 DOA: 05/01/2019 PCP: Patient, No Pcp Per    Brief Narrative:  HPI per Dr Elie Goody is a 73 y.o. male with medical history significant of recurrent renal stones and hematuria, CAD status post CABG, hypertension and hyperlipidemia, PAT, COPD presented to ER with a complaint of abdominal pressure and dislodging of suprapubic catheter.  According to patient, his suprapubic catheter fell off suddenly 2 days ago.  Since then he has been having suprapubic pressure/pain which has gotten significantly worse over the past 24 hours.  He has also been having some shortness of breath with exertion.  Denies any other complaints such as cough, fever, diarrhea, nausea, vomiting, dizziness, headache or any other complaint.  No sick contacts and no recent travel.  He has noticed some dribbling of urine from suprapubic catheter insertion site and from penis.  ED Course: In the ED, he had low-grade fever of 100.7 with some tachycardia up to 106.  CBC showed severe leukocytosis of more than 31 and CMP showed creatinine of 2.4 and bilirubin of 3.3.  Urine analysis was positive for bacteria, white cells and leukoesterase.  He was diagnosed with sepsis due to UTI.  He was given Zosyn.  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  Patient currently afebrile.  Leukocytosis trending down.  Lactic acid trending down.  Blood cultures with 2/4 positive for Proteus Mirabilis.  Urinalysis concerning for UTI.  Urine cultures negative to date.  Repeat urine cultures pending.  Patient with bilateral CVA tenderness ( R > L) to palpation.  CT renal stone protocol done was concerning for pyelonephritis and did show left hydroureteronephrosis with mild left perinephric and periureteral fat stranding.  Sensitivities have resulted which is pansensitive.  Repeat blood cultures x2 pending.  IV Rocephin has been narrowed to IV ampicillin.  If blood cultures remain negative for the next 48 to 72 hours could transition to oral amoxicillin to complete a 10-day course of antibiotic treatment.  Discussed with ID, Dr. Orvan Falconer.  Follow.    2.  New onset A. Fib CHADSVASC score 4 Patient noted to be a new onset A. fib on admission.  Likely secondary to problem #1.  Patient noted per telemetry to have converted to normal sinus rhythm with type I second-degree AV block with possible run of a tachycardia versus junctional tachycardia.  TSH at 0.714.  2D echo with a EF of 55 to 60%, no wall motion abnormalities.  Patient seen in consultation by cardiology.  Patient started on amiodarone drip in addition to Coreg twice  daily for rate control.  Coreg dose decreased per cardiology in order to use amiodarone to restore sinus rhythm per cardiology.  Patient noted to have prior history of CVA and per cardiology will likely require long-term anticoagulation.  Coreg to be switched to Lopressor per cardiology.  Patient has been transitioned from heparin to Eliquis. Cardiology following and appreciate input and  recommendations.  3.  Peripheral vascular disease Patient with complaints of right calf and right ankle pain ongoing for a while.  ABI of 0.6 in 2017.  Continue statin.  Tobacco cessation stressed to patient.  Patient on anticoagulation with heparin has been transitioned to Eliquis.  Outpatient referral to vascular surgery/Dr. Allyson Sabal.  GI office will call patient for appointment information.  4.  Tobacco abuse Tobacco cessation.  Nicotine patch.    5.  Hyperlipidemia Continue statin.  7.  Coronary artery disease status post CABG November 2019 Stable.  Continue statin, Coreg.  Cardiology following.  8.  Acute renal failure Likely secondary to post renal azotemia secondary to obstruction and sepsis.  Suprapubic catheter fell out 2 days prior to admission and patient with complaints of abdominal pain and distention.  Suprapubic catheter placed stent with good urine output.  CT stone protocol concerning for pyelonephritis and left hydroureteronephrosis with mild left perinephric and periurethral fat stranding.  Foley catheter has decompressed bladder.  Patient with a urine output of 1.050 L over the past 24 hours.  Renal function trending down creatinine currently at 1.16 from 1.18 from 1.24 from 1.6 from 2.40 on admission.  Saline lock IV fluids. Follow.   9.  Acute urinary retention/dislodgment of suprapubic catheter Suprapubic catheter reinserted in the ED.  Good urine output of 1.050 L over the past 24 hours.  Renal function improved.  Saline lock IV fluids.  Monitor renal function.  Follow.  10.  History of embolic CVA Patient on anticoagulation with heparin secondary to new onset A. fib.  Continue statin.  Discontinued aspirin.  Patient now on Eliquis.  11.  Left inguinal hernia Patient noted to have left inguinal hernia descended into his left scrotal sac.  Patient states ongoing for the past 2 weeks with worsening when standing up coughing and also when trying to ambulate and reduces when  laying down.  Per RN patient having bowel movements with no nausea or vomiting and no signs of obstruction at this time.  Patient seen in consultation by general surgery who I recommended elective hernia repair after patient has cleared his bacteremia/urosepsis and once he has quit smoking.  Outpatient follow-up with general surgery.  Appreciate surgery's input and recommendations.   12.  Hypokalemia Potassium repleted and currently at 3.8.  Magnesium at 2.1.  Follow.   13.  Anemia of chronic disease/vitamin B12 deficiency Likely dilutional component.  Patient with no overt bleeding.  FOBT has been ordered.  Anemia panel consistent with anemia of chronic disease.  Iron levels at 11, TIBC of 174, ferritin of 375, folate of 6.1.  Vitamin B12 levels of 154.  Status post IV Feraheme x1.  Continue vitamin B12 1000 MCG's subcutaneously daily x1 week and then weekly x1 month and then monthly.  Will need outpatient follow-up with PCP.   14.  Cough Chest x-ray unremarkable.  IV fluids have been saline locked.  Hycodan as needed.  Placed on PPI.  GI cocktail as needed.  Tessalon Perles 3 times daily.  Follow.    15.  COPD Stable.  Continue Dulera, Spiriva.  Xopenex 3 times daily.  Tobacco cessation stressed to patient.  16.  Hypertension Coreg has been switched to metoprolol per cardiology due to COPD.   DVT prophylaxis: Eliquis Code Status: Full Family Communication: Updated patient.  No family at bedside. Disposition Plan: To be determined   Consultants:   Cardiology: Dr. Delton SeeNelson 05/02/2019  General surgery: Dr. Doylene Canardonner 05/03/2019  Procedures:   CT renal stone protocol 05/01/2019  Chest x-ray 05/01/2019, 05/04/2019  2D echo 05/02/2019  Antimicrobials:   IV Rocephin 05/01/2019>>>> 05/04/2019  IV ampicillin 05/05/2019   Subjective: Patient lying down.  Patient stated had some coughing spells after drinking potassium supplement.  Patient complaining of burning in his chest area.  No shortness  of breath.  Passing flatus.  Abdominal pain improving daily.   Objective: Vitals:   05/04/19 1315 05/04/19 1944 05/04/19 2123 05/05/19 0424  BP: 121/65  (!) 143/58 (!) 149/82  Pulse: 68  72 78  Resp: 17   18  Temp: 97.9 F (36.6 C)  98.4 F (36.9 C) 97.9 F (36.6 C)  TempSrc: Oral  Oral Oral  SpO2: 100% 94% 94% 99%  Weight:    57.3 kg  Height:        Intake/Output Summary (Last 24 hours) at 05/05/2019 1151 Last data filed at 05/05/2019 0900 Gross per 24 hour  Intake 1112.39 ml  Output 1050 ml  Net 62.39 ml   Filed Weights   05/02/19 0451 05/04/19 0458 05/05/19 0424  Weight: 52.2 kg 57 kg 57.3 kg    Examination:  General exam: NAD Respiratory system: Lungs clear to auscultation bilaterally.  No wheezes, no crackles, no rhonchi.  Speaking in full sentences.  Cardiovascular system: Irregularly irregular.  No JVD.  No lower extremity edema.  Gastrointestinal system: Abdomen is soft, nontender, nondistended, positive bowel sounds.  Some bilateral CVA tenderness right greater than left.  Suprapubic catheter in place.  No rebound.  No guarding. GU: Left inguinal hernia noted in scrotum. Central nervous system: Alert and oriented. No focal neurological deficits. Extremities: Symmetric 5 x 5 power. Skin: No rashes, lesions or ulcers Psychiatry: Judgement and insight appear normal. Mood & affect appropriate.     Data Reviewed: I have personally reviewed following labs and imaging studies  CBC: Recent Labs  Lab 05/01/19 1400 05/02/19 0136 05/03/19 0137 05/04/19 0411 05/05/19 0500  WBC 31.9* 26.4* 18.0* 10.9* 9.7  NEUTROABS 28.5*  --  15.2* 8.2* 6.7  HGB 15.5 12.1* 10.0* 9.0* 8.8*  HCT 45.3 37.1* 30.4* 28.1* 27.8*  MCV 106.3* 108.5* 109.4* 109.3* 109.0*  PLT 220 186 145* 154 186   Basic Metabolic Panel: Recent Labs  Lab 05/01/19 1400 05/02/19 0136 05/03/19 0137 05/04/19 0405 05/04/19 0411 05/05/19 0500  NA 135 136 135  --  138 138  K 4.2 3.9 3.3*  --  3.4* 3.8   CL 96* 105 107  --  110 110  CO2 25 22 20*  --  20* 21*  GLUCOSE 109* 99 114*  --  108* 93  BUN 37* 31* 21  --  15 10  CREATININE 2.40* 1.60* 1.24  --  1.18 1.16  CALCIUM 9.8 8.3* 7.9*  --  8.2* 8.7*  MG  --  2.0  --  1.8  --  2.1   GFR: Estimated Creatinine Clearance: 47.3 mL/min (by C-G formula based on SCr of 1.16 mg/dL). Liver Function Tests: Recent Labs  Lab 05/01/19 1400 05/02/19 0136  AST 26 18  ALT 16 13  ALKPHOS 70 44  BILITOT 3.3* 1.8*  PROT 9.1* 6.3*  ALBUMIN 4.2 2.8*   No results for input(s): LIPASE, AMYLASE in the last 168 hours. No results for input(s): AMMONIA in the last 168 hours. Coagulation Profile: Recent Labs  Lab 05/02/19 0136  INR 1.5*   Cardiac Enzymes: No results for input(s): CKTOTAL, CKMB, CKMBINDEX, TROPONINI in the last 168 hours. BNP (last 3 results) No results for input(s): PROBNP in the last 8760 hours. HbA1C: No results for input(s): HGBA1C in the last 72 hours. CBG: No results for input(s): GLUCAP in the last 168 hours. Lipid Profile: No results for input(s): CHOL, HDL, LDLCALC, TRIG, CHOLHDL, LDLDIRECT in the last 72 hours. Thyroid Function Tests: No results for input(s): TSH, T4TOTAL, FREET4, T3FREE, THYROIDAB in the last 72 hours. Anemia Panel: Recent Labs    05/04/19 0411  VITAMINB12 154*  FOLATE 6.1  FERRITIN 375*  TIBC 174*  IRON 11*   Sepsis Labs: Recent Labs  Lab 05/01/19 1418 05/01/19 1645 05/02/19 0136 05/03/19 0137 05/04/19 0411  PROCALCITON  --   --  9.33 9.31 5.43  LATICACIDVEN 3.4* 4.8* 2.4* 1.6  --     Recent Results (from the past 240 hour(s))  Urine culture     Status: None   Collection Time: 05/01/19  2:20 PM   Specimen: Urine, Random  Result Value Ref Range Status   Specimen Description   Final    URINE, RANDOM Performed at [facility], [address] [place], [place] [address]    Special Requests   Final    NONE Performed at [facility], [address], [place], [place] [address]    Culture   Final    Multiple bacterial morphotypes present, none predominant. Suggest appropriate recollection if clinically indicated.   Report Status 05/03/2019 FINAL  Final  Blood culture (routine x 2)     Status: Abnormal   Collection Time: 05/01/19  2:37 PM   Specimen: BLOOD RIGHT ARM  Result Value Ref Range Status   Specimen Description   Final    BLOOD RIGHT ARM Performed at [facility], [address], [place], [place] [address]    Special Requests   Final    BOTTLES DRAWN AEROBIC AND ANAEROBIC Blood Culture adequate volume Performed at [facility], [address], [place], [place] [address]    Culture  Setup Time (A)  Final    GRAM VARIABLE ROD IN BOTH AEROBIC AND ANAEROBIC BOTTLES CRITICAL RESULT CALLED TO, READ BACK BY AND VERIFIED WITH: PHARMD [facility] [id number] FCP Performed at [facility], [address] [place], [place] [address]    Culture PROTEUS MIRABILIS (A)  Final   Report Status 05/04/2019 FINAL  Final   Organism ID, Bacteria PROTEUS MIRABILIS  Final      Susceptibility   Proteus mirabilis - MIC*    AMPICILLIN <=2 SENSITIVE Sensitive     CEFAZOLIN <=4 SENSITIVE Sensitive     CEFEPIME <=1 SENSITIVE Sensitive     CEFTAZIDIME <=1 SENSITIVE Sensitive     CEFTRIAXONE <=1 SENSITIVE Sensitive     CIPROFLOXACIN <=0.25 SENSITIVE Sensitive     GENTAMICIN <=1 SENSITIVE Sensitive     IMIPENEM 2 SENSITIVE Sensitive     TRIMETH/SULFA <=20 SENSITIVE Sensitive     AMPICILLIN/SULBACTAM <=2 SENSITIVE Sensitive     PIP/TAZO <=4 SENSITIVE Sensitive     * PROTEUS MIRABILIS  Blood Culture ID Panel (Reflexed)     Status: Abnormal   Collection Time: 05/01/19  2:37 PM  Result Value Ref Range Status   Enterococcus species NOT DETECTED NOT DETECTED Final   Listeria monocytogenes NOT DETECTED NOT DETECTED Final   Staphylococcus species NOT DETECTED NOT DETECTED Final   Staphylococcus aureus (BCID) NOT  DETECTED NOT DETECTED Final   Streptococcus species NOT DETECTED NOT DETECTED Final   Streptococcus agalactiae NOT DETECTED NOT DETECTED Final   Streptococcus pneumoniae NOT DETECTED NOT DETECTED Final   Streptococcus pyogenes NOT DETECTED NOT DETECTED Final   Acinetobacter baumannii NOT DETECTED NOT DETECTED Final   Enterobacteriaceae species DETECTED (A) NOT DETECTED Final    Comment: Enterobacteriaceae represent a large family of gram-negative bacteria, not a single organism. CRITICAL RESULT CALLED TO, READ BACK BY AND VERIFIED WITH: PHARMD [facility] [id number] FCP    Enterobacter cloacae complex NOT DETECTED NOT DETECTED Final   Escherichia coli NOT DETECTED NOT DETECTED Final   Klebsiella oxytoca NOT DETECTED NOT DETECTED Final   Klebsiella pneumoniae NOT DETECTED NOT DETECTED Final   Proteus species DETECTED (A) NOT DETECTED Final    Comment: CRITICAL RESULT CALLED TO, READ BACK BY AND VERIFIED WITH: PHARMD [facility] [id number] FCP    Serratia marcescens NOT DETECTED NOT DETECTED Final   Carbapenem resistance NOT DETECTED NOT DETECTED Final   Haemophilus influenzae NOT DETECTED NOT DETECTED Final   Neisseria meningitidis NOT DETECTED NOT DETECTED Final   Pseudomonas aeruginosa NOT DETECTED NOT DETECTED Final   Candida albicans NOT DETECTED NOT DETECTED Final   Candida glabrata NOT DETECTED NOT DETECTED Final   Candida krusei NOT DETECTED NOT DETECTED Final   Candida parapsilosis NOT DETECTED NOT DETECTED Final   Candida tropicalis NOT DETECTED NOT DETECTED Final    Comment: Performed at [facility], [place] [address], [place], [place] [address]  Blood culture (routine x 2)     Status: Abnormal   Collection Time: 05/01/19  3:15 PM   Specimen: BLOOD  Result Value Ref Range Status   Specimen Description   Final    BLOOD LEFT ANTECUBITAL Performed at [facility] [address], [place], [place] [address]    Special Requests   Final    BOTTLES DRAWN  AEROBIC AND ANAEROBIC Blood Culture adequate volume Performed at [facility] [address], [place], [place] [address]    Culture  Setup Time   Final    GRAM NEGATIVE RODS IN BOTH AEROBIC AND ANAEROBIC BOTTLES CRITICAL VALUE NOTED.  VALUE IS CONSISTENT WITH PREVIOUSLY REPORTED AND CALLED VALUE.    Culture (A)  Final    PROTEUS MIRABILIS SUSCEPTIBILITIES PERFORMED ON PREVIOUS CULTURE WITHIN THE LAST 5 DAYS. Performed at [facility], [place] [address] [place],  [address]    Report Status 05/04/2019 FINAL  Final  SARS Coronavirus 2 (CEPHEID- Performed in [place] hospital lab), Hosp Order     Status: None   Collection Time: 05/01/19  4:55 PM   Specimen: Nasopharyngeal Swab  Result Value Ref Range Status   SARS Coronavirus 2 NEGATIVE NEGATIVE Final    Comment: (NOTE) If result is NEGATIVE SARS-CoV-2 target nucleic acids are NOT DETECTED. The SARS-CoV-2 RNA is generally detectable in upper and lower  respiratory specimens during the acute phase of infection. The lowest  concentration of SARS-CoV-2 viral copies this assay can detect is 250  copies / mL. A negative result does not preclude SARS-CoV-2 infection  and should not be used as the sole basis for treatment or other  patient management decisions.  A negative result may occur with  improper specimen collection / handling, submission of specimen other  than nasopharyngeal swab, presence of viral mutation(s) within the  areas targeted by this assay, and inadequate number of viral copies  (<250 copies / mL). A negative result must be combined with clinical  observations, patient history, and epidemiological information. If result is POSITIVE SARS-CoV-2 target nucleic acids are DETECTED. The SARS-CoV-2 RNA is generally detectable in upper and lower  respiratory specimens dur ing the acute phase of infection.  Positive  results are indicative of active infection with SARS-CoV-2.  Clinical   correlation with patient history and other diagnostic information is  necessary to determine patient infection status.  Positive results do  not rule out bacterial infection or co-infection with other viruses. If result is PRESUMPTIVE POSTIVE SARS-CoV-2 nucleic acids MAY BE PRESENT.   A presumptive positive result was obtained on the submitted specimen  and confirmed on repeat testing.  While 2019 novel coronavirus  (SARS-CoV-2) nucleic acids may be present in the submitted sample  additional confirmatory testing may be necessary for epidemiological  and / or clinical management purposes  to differentiate between  SARS-CoV-2 and other Sarbecovirus currently known to infect humans.  If clinically indicated additional testing with an alternate test  methodology [phone]([id number]) is advised. The SARS-CoV-2 RNA is generally  detectable in upper and lower respiratory sp ecimens during the acute  phase of infection. The expected result is Negative. Fact Sheet for Patients:  BoilerBrush.com.cyhttps://www.fda.gov/media/136312/download Fact Sheet for Healthcare Providers: https://pope.com/https://www.fda.gov/media/136313/download This test is not yet approved or cleared by the [place] FDA and has been authorized for detection and/or diagnosis of SARS-CoV-2 by FDA under an Emergency Use Authorization (EUA).  This EUA will remain in effect (meaning this test can be used) for the duration of the COVID-19 declaration under Section 564(b)(1) of the Act, 21 U.S.C. section 360bbb-3(b)(1), unless the authorization is terminated or revoked sooner. Performed at [facility], [address] [place], [place] [address]   Culture, Urine     Status: Abnormal   Collection Time: 05/03/19 12:44 PM   Specimen: Urine, Random  Result Value Ref Range Status   Specimen Description   Final    URINE, RANDOM Performed at [facility], [address] [place], [place] [address]    Special Requests   Final     NONE Performed at [facility], [address] [place], [place] [address]    Culture (A)  Final    <10,000 COLONIES/mL INSIGNIFICANT GROWTH Performed at [facility], [address] [place], [place] [address]    Report Status 05/04/2019 FINAL  Final  Culture, blood (Routine X 2) w Reflex to ID Panel     Status: None (Preliminary result)   Collection Time: 05/04/19 11:29 AM   Specimen: BLOOD RIGHT HAND  Result Value Ref Range Status   Specimen Description   Final    BLOOD RIGHT HAND Performed at [facility], [address] [place], [place] [address]    Special Requests   Final    BOTTLES DRAWN AEROBIC AND ANAEROBIC Blood Culture adequate volume Performed at [facility], [address] [place], [place] [address]    Culture   Final    NO GROWTH < 24 HOURS Performed at [facility], [address] [place], [place] [address]    Report Status PENDING  Incomplete  Culture, blood (Routine X 2) w Reflex  to ID Panel     Status: None (Preliminary result)   Collection Time: 05/04/19 11:34 AM   Specimen: BLOOD LEFT ARM  Result Value Ref Range Status   Specimen Description   Final    BLOOD LEFT ARM Performed at [facility], [address] [place], [place] [address]    Special Requests   Final    BOTTLES DRAWN AEROBIC AND ANAEROBIC Blood Culture results may not be optimal due to an inadequate volume of blood received in culture bottles Performed at [facility], [address] [place], [place] [address]    Culture   Final    NO GROWTH < 24 HOURS Performed at [facility], [address] [place], [place] [address]    Report Status PENDING  Incomplete         Radiology Studies: Dg Chest 2 View  Result Date: 05/04/2019 CLINICAL DATA:  Cough EXAM: CHEST - 2 VIEW COMPARISON:  05/01/2019 FINDINGS: Cardiac shadow is mildly prominent accentuated by the portable technique. Postsurgical changes  are noted. Loop recorder is seen. Lungs are hyperinflated without focal infiltrate. No sizable effusion is seen. No bony abnormality is noted. IMPRESSION: COPD without acute abnormality. Electronically Signed   By: Alcide CleverMark  Lukens M.D.   On: 05/04/2019 14:15        Scheduled Meds: . apixaban  5 mg Oral BID  . atorvastatin  80 mg Oral Daily  . carvedilol  3.125 mg Oral BID  . cyanocobalamin  1,000 mcg Subcutaneous Daily  . mometasone-formoterol  2 puff Inhalation BID  . sodium chloride flush  3 mL Intravenous Q12H  . umeclidinium bromide  1 puff Inhalation Daily   Continuous Infusions: . ampicillin (OMNIPEN) IV 2 g (05/05/19 1052)     LOS: 4 days    Time spent: 40 minutes    Ramiro Harvestaniel Thompson, MD [facility]  If 7PM-7AM, please contact night-coverage [email or web address] 05/05/2019, 11:51 AM

## 2019-05-05 NOTE — Progress Notes (Signed)
Pt with sustained heart rate in 50's has scheduled dose of Metoprolol 12.5 mg due.  Message sent to K. Schorr to see if dose should be held.

## 2019-05-05 NOTE — Progress Notes (Signed)
Patient continues to refuse to get OOB to chair or walk after several attempts.  Patient states "I just don't feel like it, I'll get up tomorrow"   Emphasized importance of mobility. Will continue to encourage.

## 2019-05-05 NOTE — Progress Notes (Signed)
[place] for apixaban Indication: new onset atrial fibrillation  Allergies  Allergen Reactions  . Lactose Intolerance (Gi) Diarrhea    Patient Measurements: Height: 5\' 8"  (172.7 cm) Weight: 126 lb 6.4 oz (57.3 kg) IBW/kg (Calculated) : 68.4  Vital Signs: Temp: 97.9 F (36.6 C) (06/30 0424) Temp Source: Oral (06/30 0424) BP: 149/82 (06/30 0424) Pulse Rate: 78 (06/30 0424)  Labs: Recent Labs    05/03/19 0137 05/03/19 1100 05/03/19 2100 05/04/19 0411 05/05/19 0500  HGB 10.0*  --   --  9.0* 8.8*  HCT 30.4*  --   --  28.1* 27.8*  PLT 145*  --   --  154 186  HEPARINUNFRC 0.50 0.27* 0.38 2.00*  --   CREATININE 1.24  --   --  1.18 1.16    Estimated Creatinine Clearance: 47.3 mL/min (by C-G formula based on SCr of 1.16 mg/dL).   Assessment: Patient's a 73 y.o M with hx recurrent renal stones and hematuria and CABG presented to the ED on 6/26 with dislodged suprapubic cath and c/o abd pain. He was found to have new onset afib and heparin drip initiated for anticoagulation.   Baseline INR slightly elevated 1.5  Prior anticoagulation: none  Significant events: 6/29: Transitioned from heparin drip to apixaban  Today, 05/05/19  Hgb 8.8 - trending down. Anemia and hemodilution could be contributing factor. No signs/symptoms of bleeding.  Plt 186 - WNL  Fecal occult negative on 6/29  Goal of Therapy: Monitor platelets by anticoagulation protocol: Yes  Plan:   Continue apixaban 5 mg PO BID  Age: 73, Wt: 57 kg, SCr: 1.16  Recommend continue to monitor for signs of bleeding or thrombosis  Patient provided with manufacturer coupon and medication counseling on 6/30  Lenis Noon, PharmD 05/05/2019, 12:16 PM

## 2019-05-05 NOTE — Progress Notes (Signed)
Spoke with Melina Copa concerning patients low HR.  Has been sustaining in 48-58 this afternoon.  Patient denies symptoms. BP WNL.  Metoprolol on schedule but will be moved to start at 2200 instead of now.  Will continue to monitor.

## 2019-05-06 LAB — BASIC METABOLIC PANEL
Anion gap: 5 (ref 5–15)
BUN: 7 mg/dL — ABNORMAL LOW (ref 8–23)
CO2: 23 mmol/L (ref 22–32)
Calcium: 8.9 mg/dL (ref 8.9–10.3)
Chloride: 110 mmol/L (ref 98–111)
Creatinine, Ser: 1.04 mg/dL (ref 0.61–1.24)
GFR calc Af Amer: >60 mL/min (ref >60)
GFR calc non Af Amer: >60 mL/min (ref >60)
Glucose, Bld: 91 mg/dL (ref 70–99)
Potassium: 3.7 mmol/L (ref 3.5–5.1)
Sodium: 138 mmol/L (ref 135–145)

## 2019-05-06 LAB — CBC WITH DIFFERENTIAL/PLATELET
Abs Immature Granulocytes: 0.08 10*3/uL — ABNORMAL HIGH (ref 0.00–0.07)
Basophils Absolute: 0 10*3/uL (ref 0.0–0.1)
Basophils Relative: 0 %
Eosinophils Absolute: 0.3 10*3/uL (ref 0.0–0.5)
Eosinophils Relative: 3 %
HCT: 28.4 % — ABNORMAL LOW (ref 39.0–52.0)
Hemoglobin: 8.9 g/dL — ABNORMAL LOW (ref 13.0–17.0)
Immature Granulocytes: 1 %
Lymphocytes Relative: 9 %
Lymphs Abs: 1 10*3/uL (ref 0.7–4.0)
MCH: 34.6 pg — ABNORMAL HIGH (ref 26.0–34.0)
MCHC: 31.3 g/dL (ref 30.0–36.0)
MCV: 110.5 fL — ABNORMAL HIGH (ref 80.0–100.0)
Monocytes Absolute: 2.1 10*3/uL — ABNORMAL HIGH (ref 0.1–1.0)
Monocytes Relative: 18 %
Neutro Abs: 7.9 10*3/uL — ABNORMAL HIGH (ref 1.7–7.7)
Neutrophils Relative %: 69 %
Platelets: 224 10*3/uL (ref 150–400)
RBC: 2.57 MIL/uL — ABNORMAL LOW (ref 4.22–5.81)
RDW: 14.7 % (ref 11.5–15.5)
WBC: 11.4 10*3/uL — ABNORMAL HIGH (ref 4.0–10.5)
nRBC: 0.4 % — ABNORMAL HIGH (ref 0.0–0.2)

## 2019-05-06 MED ORDER — PANTOPRAZOLE SODIUM 40 MG PO TBEC: 40.0000 mg | DELAYED_RELEASE_TABLET | Freq: Every day | ORAL | 0 refills | Status: DC

## 2019-05-06 MED ORDER — MOMETASONE FURO-FORMOTEROL FUM 100-5 MCG/ACT IN AERO: 2.0000 | INHALATION_SPRAY | Freq: Two times a day (BID) | RESPIRATORY_TRACT | 0 refills | Status: DC

## 2019-05-06 MED ORDER — APIXABAN 5 MG PO TABS: 5.0000 mg | ORAL_TABLET | Freq: Two times a day (BID) | ORAL | 2 refills | Status: DC

## 2019-05-06 MED ORDER — BENZONATATE 200 MG PO CAPS: 200.0000 mg | ORAL_CAPSULE | Freq: Three times a day (TID) | ORAL | 0 refills | Status: DC

## 2019-05-06 MED ORDER — VITAMIN B-12 100 MCG PO TABS: 100.0000 ug | ORAL_TABLET | Freq: Every day | ORAL | 3 refills | Status: DC

## 2019-05-06 MED ORDER — UMECLIDINIUM BROMIDE 62.5 MCG/INH IN AEPB: 1.0000 | INHALATION_SPRAY | Freq: Every day | RESPIRATORY_TRACT | 1 refills | Status: DC

## 2019-05-06 MED ORDER — AMOXICILLIN 500 MG PO CAPS: 500.0000 mg | ORAL_CAPSULE | Freq: Three times a day (TID) | ORAL | 0 refills | Status: DC

## 2019-05-06 MED ORDER — AMOXICILLIN 500 MG PO CAPS: 500.0000 mg | ORAL_CAPSULE | Freq: Three times a day (TID) | ORAL | 0 refills | Status: AC

## 2019-05-06 NOTE — Discharge Summary (Signed)
Physician Discharge Summary  Mohid Furuya ZOX:[id number] DOB: 1946-08-19 DOA: 05/01/2019  PCP: Patient, No Pcp Per  Admit date: 05/01/2019 Discharge date: 05/06/2019  Admitted From: Home  Disposition:  Home   Recommendations for Outpatient Follow-up:  1. Follow up with PCP in 1-2 weeks 2. Please obtain BMP/CBC in one week 3. Follow up with ID for further care UTI 4. Follow up with cardiology for further care of af ib  Home Health:none  Discharge Condition: stable.  CODE STATUS: full code Diet recommendation: Heart Healthy   Brief/Interim Summary: Keenon Surlesis a 73 y.o.malewith medical history significant ofrecurrent renal stones and hematuria, CAD status post CABG, hypertension and hyperlipidemia, PAT, COPD presented to ER with a complaint of abdominal pressure and dislodging of suprapubic catheter. According to patient, his suprapubic catheter fell off suddenly 2 days ago. Since then he has been having suprapubic pressure/pain which has gotten significantly worse over the past 24 hours. He has also been having some shortness of breath with exertion. Denies any other complaints such as cough, fever, diarrhea, nausea, vomiting, dizziness, headache or any other complaint. No sick contacts and no recent travel. He has noticed some dribbling of urine from suprapubic catheter insertion site and from penis.  ED Course:In the ED, he had low-grade fever of 100.7 with some tachycardia up to 106. CBC showed severe leukocytosis of more than 31 and CMP showed creatinine of 2.4 and bilirubin of 3.3. Urine analysis was positive for bacteria, white cells and leukoesterase. He was diagnosed with sepsis due to UTI. He was given Zosyn. Suprapubic catheter was replaced and hospital service was consulted for admission   1 sepsis secondary to complicated urinary tract infection, pyelonephritis, Proteus bacteremia, POA Patient presented to the ED with abdominal discomfort after suprapubic  catheter fell off 2 days prior to admission, fever with a temp of 100.7, leukocytosis with a white count of 31.9, patient with elevated lactic acid level on admission of 4.8 which is trending down, elevated procalcitonin level, noted to be in acute renal failure.  Patient currently afebrile.  Leukocytosis trending down.  Lactic acid trending down.  Blood cultures with 2/4 positive for Proteus Mirabilis.  Urinalysis concerning for UTI.  Urine cultures negative to date.    Patient with bilateral CVA tenderness ( R > L) to palpation.  CT renal stone protocol done was concerning for pyelonephritis and did show left hydroureteronephrosis with mild left perinephric and periureteral fat stranding.  Sensitivities have resulted which is pansensitive.  Repeat blood cultures x2 pending, no growth to date.  IV Rocephin has been narrowed to IV ampicillin. Discussed with ID, Dr. Orvan Falconer. Discharge on 5 more days of Antibiotics.  Needs total 10 days treatment.   2.  New onset A. Fib CHADSVASC score 4 Patient noted to be a new onset A. fib on admission.  Likely secondary to problem #1.  Patient noted per telemetry to have converted to normal sinus rhythm with type I second-degree AV block with possible run of a tachycardia versus junctional tachycardia.  TSH at 0.714.  2D echo with a EF of 55 to 60%, no wall motion abnormalities.  Patient seen in consultation by cardiology.  Patient started on amiodarone drip in addition to Coreg twice daily for rate control.  Coreg dose decreased per cardiology in order to use amiodarone to restore sinus rhythm per cardiology.  Patient noted to have prior history of CVA and per cardiology will likely require long-term anticoagulation.  Coreg to be switched to Lopressor per cardiology.  Patient has been transitioned from heparin to Eliquis. Cardiology following and appreciate input and recommendations. -subsequently develops bradycardia , Mobits type 1 av block. Cardiology recommended to  discontinue metoprolol and coreg.  Follow up with cardiology out patient.   3.  Peripheral vascular disease Patient with complaints of right calf and right ankle pain ongoing for a while.  ABI of 0.6 in 2017.  Continue statin.  Tobacco cessation stressed to patient.  Patient on anticoagulation with heparin has been transitioned to Eliquis.  Outpatient referral to vascular surgery/Dr. Gwenlyn Found.  GI office will call patient for appointment information.  4.  Tobacco abuse Tobacco cessation.  Nicotine patch.    5.  Hyperlipidemia Continue statin.  7.  Coronary artery disease status post CABG November 2019 Stable.  Continue statin, Coreg.  Cardiology following.  8.  Acute renal failure Likely secondary to post renal azotemia secondary to obstruction and sepsis.  Suprapubic catheter fell out 2 days prior to admission and patient with complaints of abdominal pain and distention.  Suprapubic catheter placed stent with good urine output.  CT stone protocol concerning for pyelonephritis and left hydroureteronephrosis with mild left perinephric and periurethral fat stranding.  Foley catheter has decompressed bladder.  Patient with a urine output of 1.050 L over the past 24 hours.  Renal function trending down creatinine currently at 1.16 from 1.18 from 1.24 from 1.6 from 2.40 on admission.  S  9.  Acute urinary retention/dislodgment of suprapubic catheter Suprapubic catheter reinserted in the ED.  Good urine output of 1.050 L over the past 24 hours.  Renal function improved.  Saline lock IV fluids.  Monitor renal function  10.  History of embolic CVA Patient on anticoagulation with heparin secondary to new onset A. fib.  Continue statin.  Discontinued aspirin.  Patient now on Eliquis.  11.  Left inguinal hernia Patient noted to have left inguinal hernia descended into his left scrotal sac.  Patient states ongoing for the past 2 weeks with worsening when standing up coughing and also when trying to  ambulate and reduces when laying down.  Per RN patient having bowel movements with no nausea or vomiting and no signs of obstruction at this time.  Patient seen in consultation by general surgery who I recommended elective hernia repair after patient has cleared his bacteremia/urosepsis and once he has quit smoking.  Outpatient follow-up with general surgery.  Appreciate surgery's input and recommendations.   12.  Hypokalemia Potassium repleted and currently at 3.8.  Magnesium at 2.1.   13.  Anemia of chronic disease/vitamin B12 deficiency Likely dilutional component.  Patient with no overt bleeding.  FOBT has been ordered.  Anemia panel consistent with anemia of chronic disease.  Iron levels at 11, TIBC of 174, ferritin of 375, folate of 6.1.  Vitamin B12 levels of 154.  Status post IV Feraheme x1.  Continue vitamin B12 1000 MCG's while in patient. Discharge on oral supplement.  Will need outpatient follow-up with PCP.   14.  Cough Chest x-ray unremarkable.  IV fluids have been saline locked.  Hycodan as needed.  Placed on PPI.  GI cocktail as needed.  Tessalon Perles 3 times daily.  Follow.    15.  COPD Stable.  Continue Dulera, Spiriva.  Xopenex 3 times daily.  Tobacco cessation stressed to patient.  16.  Hypertension  resume lisinopril.    Discharge Diagnoses:  Principal Problem:   Sepsis [facility]) Active Problems:   Bladder stones   Essential hypertension   Complicated  UTI (urinary tract infection)   Acute pyelonephritis   AKI (acute kidney injury) (HCC)   S/P CABG x 07 Sep 2018   Coronary artery disease   New onset a-fib (HCC)   Bacteremia due to Gram-negative bacteria   B12 deficiency   Left inguinal hernia   Cough   Anemia    Discharge Instructions  Discharge Instructions    Diet - low sodium heart healthy   Complete by: As directed    Increase activity slowly   Complete by: As directed      Allergies as of 05/06/2019      Reactions   Lactose Intolerance (gi)  Diarrhea      Medication List    STOP taking these medications   aspirin EC 81 MG tablet   carvedilol 6.25 MG tablet Commonly known as: Coreg     TAKE these medications   amoxicillin 500 MG capsule Commonly known as: AMOXIL Take 1 capsule (500 mg total) by mouth 3 (three) times daily for 5 days.   apixaban 5 MG Tabs tablet Commonly known as: ELIQUIS Take 1 tablet (5 mg total) by mouth 2 (two) times daily.   atorvastatin 80 MG tablet Commonly known as: Lipitor Take 1 tablet (80 mg total) by mouth daily.   benzonatate 200 MG capsule Commonly known as: TESSALON Take 1 capsule (200 mg total) by mouth 3 (three) times daily.   lisinopril 2.5 MG tablet Commonly known as: Zestril Take 1 tablet (2.5 mg total) by mouth daily.   mometasone-formoterol 100-5 MCG/ACT Aero Commonly known as: DULERA Inhale 2 puffs into the lungs 2 (two) times daily.   pantoprazole 40 MG tablet Commonly known as: PROTONIX Take 1 tablet (40 mg total) by mouth daily at 6 (six) AM. Start taking on: May 07, 2019   umeclidinium bromide 62.5 MCG/INH Aepb Commonly known as: INCRUSE ELLIPTA Inhale 1 puff into the lungs daily. Start taking on: May 07, 2019   vitamin B-12 100 MCG tablet Commonly known as: CYANOCOBALAMIN Take 1 tablet (100 mcg total) by mouth daily.      Follow-up Information    Abelino DerrickKilroy, Luke K, PA-C Follow up on 05/19/2019.   Specialties: Cardiology, Radiology Why: 1:15 pm for hospital follow up Contact information: [address] [place] [address] [phone]          Allergies  Allergen Reactions  . Lactose Intolerance (Gi) Diarrhea    Consultations:  ID phone   Procedures/Studies: Dg Chest 1 View  Result Date: 05/01/2019 CLINICAL DATA:  Shortness of breath and weakness EXAM: CHEST  1 VIEW COMPARISON:  October 23, 2018 FINDINGS: Lungs are hyperexpanded. There is mild scarring in the left base. There is no edema or consolidation. Heart size and  pulmonary vascularity are normal. Patient is status post coronary artery bypass grafting. There is a loop recorder on the left. There is aortic atherosclerosis. No evident adenopathy. No pneumothorax. No bone lesions. IMPRESSION: Lungs hyperexpanded with mild scarring in the left base, stable. No edema or consolidation. Stable cardiac silhouette. Postoperative changes noted. Aortic Atherosclerosis (ICD10-I70.0). Electronically Signed   By: Bretta BangWilliam  Woodruff III M.D.   On: 05/01/2019 15:51   Dg Chest 2 View  Result Date: 05/04/2019 CLINICAL DATA:  Cough EXAM: CHEST - 2 VIEW COMPARISON:  05/01/2019 FINDINGS: Cardiac shadow is mildly prominent accentuated by the portable technique. Postsurgical changes are noted. Loop recorder is seen. Lungs are hyperinflated without focal infiltrate. No sizable effusion is seen. No bony abnormality is noted. IMPRESSION:  COPD without acute abnormality. Electronically Signed   By: Alcide Clever M.D.   On: 05/04/2019 14:15   Ct Renal Stone Study  Result Date: 05/01/2019 CLINICAL DATA:  Initial evaluation for hematuria, unknown cause. EXAM: CT ABDOMEN AND PELVIS WITHOUT CONTRAST TECHNIQUE: Multidetector CT imaging of the abdomen and pelvis was performed following the standard protocol without IV contrast. COMPARISON:  Prior CT from 06/11/2017. FINDINGS: Lower chest: 15 x 9 mm nodule present at the central aspect of the right lower lobe (series 4, image 8), indeterminate. Few additional 4 mm nodules within the subpleural right lower lung (series 4, images 15, 23). Fat containing Bochdalek's type hernias noted at the lung bases bilaterally. Mild scattered subsegmental atelectasis seen dependently. Hepatobiliary: Limited noncontrast evaluation liver is unremarkable. Gallbladder within normal limits. No biliary dilatation. Pancreas: Pancreas within normal limits. Spleen: Spleen within normal limits. Adrenals/Urinary Tract: Adrenal glands are normal. Right kidney unremarkable without  nephrolithiasis or hydronephrosis. No radiopaque calculi seen along the course of the right renal collecting system. No right-sided hydroureter. On the left, there is mild left hydroureter without frank hydronephrosis. Mild perinephric and periureteral fat stranding. Punctate 3 mm nonobstructive stone within the interpolar left kidney. No distally obstructive stone identified. Findings could reflect sequelae of a recently passed stone or possibly infection. Bladder decompressed with an indwelling suprapubic catheter in place. Circumferential wall thickening likely related incomplete distension and/or underlying neurogenic bladder. Scattered calcifications noted within the bladder wall. Gas lucency within the bladder lumen compatible with catheterization. Superimposed cystitis could also be considered. No definite layering stones within the decompressed bladder lumen. Stomach/Bowel: Stomach decompressed without acute abnormality. No evidence for bowel obstruction. Left inguinal hernia containing fat and loops of small bowel present without associated obstruction or inflammation. No acute inflammatory changes seen elsewhere about the bowels. Vascular/Lymphatic: Moderate aorto bi-iliac atherosclerotic disease. No aneurysm. No appreciable adenopathy. Reproductive: Prostate enlarged measuring 5 cm in transverse diameter. Other: No free air or fluid. Musculoskeletal: No acute osseous abnormality. No discrete lytic or blastic osseous lesions. 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 Right total hip arthroplasty in place. IMPRESSION: 1. Mild left hydroureteronephrosis with mild left perinephric and periureteral fat stranding. No obstructive stone identified. Findings could reflect sequelae of a recently passed stone or possibly infection. Correlation with urinalysis recommended. 2. Punctate 3 mm nonobstructive left renal nephrolithiasis. 3. Suprapubic catheter in place with decompression of the  urinary bladder. Circumferential wall thickening at least in part related to incomplete distension and/or underlying neurogenic bladder. Superimposed cystitis could also be considered. 4. Small bowel containing left inguinal hernia without associated obstruction or inflammation. 5. Moderate aorto bi-iliac atherosclerotic disease. 6. Enlarged prostate. 7. 15 x 9 mm right lower lobe nodule, indeterminate. Consider one of the following in 3 months for both low-risk and high-risk individuals: (a) repeat chest CT, () follow-up PET-CT, or () tissue sampling. This recommendation follows the consensus statement: Guidelines for Management of Incidental Pulmonary Nodules Detected on CT Images: From the Fleischner Society 2017; Radiology 2017; 284:228-243. Electronically Signed   By: Rise Mu M.D.   On: 05/01/2019 16:29     Subjective: Alert, no new complaints.   Discharge Exam: Vitals:   05/06/19 0928 05/06/19 1305  BP:  135/61  Pulse: (!) 57 65  Resp:  16  Temp:  98.9 F (37.2 C)  SpO2:  98%     General: Pt is alert, awake, not in acute distress Cardiovascular: RRR, S1/S2 +, no rubs, no gallops Respiratory: CTA bilaterally,  no wheezing, no rhonchi Abdominal: Soft, NT, ND, bowel sounds + Extremities: no edema, no cyanosis    The results of significant diagnostics from this hospitalization (including imaging, microbiology, ancillary and laboratory) are listed below for reference.     Microbiology: Recent Results (from the past 240 hour(s))  Urine culture     Status: None   Collection Time: 05/01/19  2:20 PM   Specimen: Urine, Random  Result Value Ref Range Status   Specimen Description   Final    URINE, RANDOM Performed at [facility], [address] [place], [place] [address]    Special Requests   Final    NONE Performed at [facility], [address] [place], [place] [address]    Culture   Final    Multiple bacterial  morphotypes present, none predominant. Suggest appropriate recollection if clinically indicated.   Report Status 05/03/2019 FINAL  Final  Blood culture (routine x 2)     Status: Abnormal   Collection Time: 05/01/19  2:37 PM   Specimen: BLOOD RIGHT ARM  Result Value Ref Range Status   Specimen Description   Final    BLOOD RIGHT ARM Performed at [facility], [address] [place], [place] [address]    Special Requests   Final    BOTTLES DRAWN AEROBIC AND ANAEROBIC Blood Culture adequate volume Performed at [facility], [address] [place], [place] [address]    Culture  Setup Time (A)  Final    GRAM VARIABLE ROD IN BOTH AEROBIC AND ANAEROBIC BOTTLES CRITICAL RESULT CALLED TO, READ BACK BY AND VERIFIED WITH: PHARMD [facility] 1615 [phone] FCP Performed at [facility], [address] [place], [place] [address]    Culture PROTEUS MIRABILIS (A)  Final   Report Status 05/04/2019 FINAL  Final   Organism ID, Bacteria PROTEUS MIRABILIS  Final      Susceptibility   Proteus mirabilis - MIC*    AMPICILLIN <=2 SENSITIVE Sensitive     CEFAZOLIN <=4 SENSITIVE Sensitive     CEFEPIME <=1 SENSITIVE Sensitive     CEFTAZIDIME <=1 SENSITIVE Sensitive     CEFTRIAXONE <=1 SENSITIVE Sensitive     CIPROFLOXACIN <=0.25 SENSITIVE Sensitive     GENTAMICIN <=1 SENSITIVE Sensitive     IMIPENEM 2 SENSITIVE Sensitive     TRIMETH/SULFA <=20 SENSITIVE Sensitive     AMPICILLIN/SULBACTAM <=2 SENSITIVE Sensitive     PIP/TAZO <=4 SENSITIVE Sensitive     * PROTEUS MIRABILIS  Blood Culture ID Panel (Reflexed)     Status: Abnormal   Collection Time: 05/01/19  2:37 PM  Result Value Ref Range Status   Enterococcus species NOT DETECTED NOT DETECTED Final   Listeria monocytogenes NOT DETECTED NOT DETECTED Final   Staphylococcus species NOT DETECTED NOT DETECTED Final   Staphylococcus aureus (BCID) NOT DETECTED NOT DETECTED Final   Streptococcus species NOT DETECTED NOT DETECTED  Final   Streptococcus agalactiae NOT DETECTED NOT DETECTED Final   Streptococcus pneumoniae NOT DETECTED NOT DETECTED Final   Streptococcus pyogenes NOT DETECTED NOT DETECTED Final   Acinetobacter baumannii NOT DETECTED NOT DETECTED Final   Enterobacteriaceae species DETECTED (A) NOT DETECTED Final    Comment: Enterobacteriaceae represent a large family of gram-negative bacteria, not a single organism. CRITICAL RESULT CALLED TO, READ BACK BY AND VERIFIED WITH: PHARMD [facility] 1615 [id number] FCP    Enterobacter cloacae complex NOT DETECTED NOT DETECTED Final   Escherichia coli NOT DETECTED NOT DETECTED  Final   Klebsiella oxytoca NOT DETECTED NOT DETECTED Final   Klebsiella pneumoniae NOT DETECTED NOT DETECTED Final   Proteus species DETECTED (A) NOT DETECTED Final    Comment: CRITICAL RESULT CALLED TO, READ BACK BY AND VERIFIED WITH: PHARMD [facility] 1615 [id number] FCP    Serratia marcescens NOT DETECTED NOT DETECTED Final   Carbapenem resistance NOT DETECTED NOT DETECTED Final   Haemophilus influenzae NOT DETECTED NOT DETECTED Final   Neisseria meningitidis NOT DETECTED NOT DETECTED Final   Pseudomonas aeruginosa NOT DETECTED NOT DETECTED Final   Candida albicans NOT DETECTED NOT DETECTED Final   Candida glabrata NOT DETECTED NOT DETECTED Final   Candida krusei NOT DETECTED NOT DETECTED Final   Candida parapsilosis NOT DETECTED NOT DETECTED Final   Candida tropicalis NOT DETECTED NOT DETECTED Final    Comment: Performed at [facility], [address] [place], [place] [address]  Blood culture (routine x 2)     Status: Abnormal   Collection Time: 05/01/19  3:15 PM   Specimen: BLOOD  Result Value Ref Range Status   Specimen Description   Final    BLOOD LEFT ANTECUBITAL Performed at [facility], [address], [place], [place] [address]    Special Requests   Final    BOTTLES DRAWN AEROBIC AND ANAEROBIC Blood Culture adequate volume Performed at [facility], [address] [place], [place] [address]    Culture  Setup Time   Final    GRAM NEGATIVE RODS IN BOTH AEROBIC AND ANAEROBIC BOTTLES CRITICAL VALUE NOTED.  VALUE IS CONSISTENT WITH PREVIOUSLY REPORTED AND CALLED VALUE.    Culture (A)  Final    PROTEUS MIRABILIS SUSCEPTIBILITIES PERFORMED ON PREVIOUS CULTURE WITHIN THE LAST 5 DAYS. Performed at [facility], [address], [place], [place] [address]    Report Status 05/04/2019 FINAL  Final  SARS Coronavirus 2 (CEPHEID- Performed in [facility] hospital lab), Hosp Order     Status: None   Collection Time: 05/01/19  4:55 PM   Specimen: Nasopharyngeal Swab  Result Value Ref Range Status   SARS Coronavirus 2 NEGATIVE NEGATIVE Final    Comment: (NOTE) If result is NEGATIVE SARS-CoV-2 target nucleic acids are NOT DETECTED. The SARS-CoV-2 RNA is generally detectable in upper and lower  respiratory specimens during the acute phase of infection. The lowest  concentration of SARS-CoV-2 viral copies this assay can detect is 250  copies / mL. A negative result does not preclude SARS-CoV-2 infection  and should not be used as the sole basis for treatment or other  patient management decisions.  A negative result may occur with  improper specimen collection / handling, submission of specimen other  than nasopharyngeal swab, presence of viral mutation(s) within the  areas targeted by this assay, and inadequate number of viral copies  (<250 copies / mL). A negative result must be combined with clinical  observations, patient history, and epidemiological information. If result is POSITIVE SARS-CoV-2 target nucleic acids are DETECTED. The SARS-CoV-2 RNA is generally detectable in upper and lower  respiratory specimens dur ing the acute phase of infection.  Positive  results are indicative of active infection with SARS-CoV-2.  Clinical  correlation with patient history and other diagnostic information is  necessary to  determine patient infection status.  Positive results do  not rule out bacterial infection or co-infection with other viruses. If result is PRESUMPTIVE POSTIVE SARS-CoV-2 nucleic acids MAY BE PRESENT.   A presumptive positive  result was obtained on the submitted specimen  and confirmed on repeat testing.  While 2019 novel coronavirus  (SARS-CoV-2) nucleic acids may be present in the submitted sample  additional confirmatory testing may be necessary for epidemiological  and / or clinical management purposes  to differentiate between  SARS-CoV-2 and other Sarbecovirus currently known to infect humans.  If clinically indicated additional testing with an alternate test  methodology [phone]) is advised. The SARS-CoV-2 RNA is generally  detectable in upper and lower respiratory sp ecimens during the acute  phase of infection. The expected result is Negative. Fact Sheet for Patients:  BoilerBrush.com.cy Fact Sheet for Healthcare Providers: https://pope.com/ This test is not yet approved or cleared by the [place] FDA and has been authorized for detection and/or diagnosis of SARS-CoV-2 by FDA under an Emergency Use Authorization (EUA).  This EUA will remain in effect (meaning this test can be used) for the duration of the COVID-19 declaration under Section 564(b)(1) of the Act, 21 U.S.C. section 360bbb-3(b)(1), unless the authorization is terminated or revoked sooner. Performed at [facility], [address], [place], [place] [address]   Culture, Urine     Status: Abnormal   Collection Time: 05/03/19 12:44 PM   Specimen: Urine, Random  Result Value Ref Range Status   Specimen Description   Final    URINE, RANDOM Performed at [facility], [address] [place], [place] [address]    Special Requests   Final    NONE Performed at [facility], [address], [place],  [place] [address]    Culture (A)  Final    <10,000 COLONIES/mL INSIGNIFICANT GROWTH Performed at [facility], [address] [place], [place] [address]    Report Status 05/04/2019 FINAL  Final  Culture, blood (Routine X 2) w Reflex to ID Panel     Status: None (Preliminary result)   Collection Time: 05/04/19 11:29 AM   Specimen: BLOOD RIGHT HAND  Result Value Ref Range Status   Specimen Description   Final    BLOOD RIGHT HAND Performed at [facility], [address] [place], [place] [address]    Special Requests   Final    BOTTLES DRAWN AEROBIC AND ANAEROBIC Blood Culture adequate volume Performed at [facility], [address], [place], [place] [address]    Culture   Final    NO GROWTH 2 DAYS Performed at [facility], [address] [place], [place] [address]    Report Status PENDING  Incomplete  Culture, blood (Routine X 2) w Reflex to ID Panel     Status: None (Preliminary result)   Collection Time: 05/04/19 11:34 AM   Specimen: BLOOD LEFT ARM  Result Value Ref Range Status   Specimen Description   Final    BLOOD LEFT ARM Performed at [facility], [address], [place], [place] [address]    Special Requests   Final    BOTTLES DRAWN AEROBIC AND ANAEROBIC Blood Culture results may not be optimal due to an inadequate volume of blood received in culture bottles Performed at [facility], [address], [place], [place] [address]    Culture   Final    NO GROWTH 2 DAYS Performed at [facility], [address], [place], [place] [address]    Report Status PENDING  Incomplete     Labs: BNP (last 3 results) No results for input(s):  BNP in the last 8760 hours. Basic Metabolic Panel: Recent Labs  Lab 05/02/19 0136 05/03/19 0137 05/04/19 0405 05/04/19 0411 05/05/19 0500 05/06/19 0426  NA 136 135  --  138 138 138  K 3.9 3.3*  --  3.4* 3.8 3.7  CL 105 107  --  110 110 110  CO2 22 20*  --  20* 21* 23   GLUCOSE 99 114*  --  108* 93 91  BUN 31* 21  --  15 10 7*  CREATININE 1.60* 1.24  --  1.18 1.16 1.04  CALCIUM 8.3* 7.9*  --  8.2* 8.7* 8.9  MG 2.0  --  1.8  --  2.1  --    Liver Function Tests: Recent Labs  Lab 05/01/19 1400 05/02/19 0136  AST 26 18  ALT 16 13  ALKPHOS 70 44  BILITOT 3.3* 1.8*  PROT 9.1* 6.3*  ALBUMIN 4.2 2.8*   No results for input(s): LIPASE, AMYLASE in the last 168 hours. No results for input(s): AMMONIA in the last 168 hours. CBC: Recent Labs  Lab 05/01/19 1400 05/02/19 0136 05/03/19 0137 05/04/19 0411 05/05/19 0500 05/06/19 0426  WBC 31.9* 26.4* 18.0* 10.9* 9.7 11.4*  NEUTROABS 28.5*  --  15.2* 8.2* 6.7 7.9*  HGB 15.5 12.1* 10.0* 9.0* 8.8* 8.9*  HCT 45.3 37.1* 30.4* 28.1* 27.8* 28.4*  MCV 106.3* 108.5* 109.4* 109.3* 109.0* 110.5*  PLT 220 186 145* 154 186 224   Cardiac Enzymes: No results for input(s): CKTOTAL, CKMB, CKMBINDEX, TROPONINI in the last 168 hours. BNP: Invalid input(s): POCBNP CBG: No results for input(s): GLUCAP in the last 168 hours. D-Dimer No results for input(s): DDIMER in the last 72 hours. Hgb A1c No results for input(s): HGBA1C in the last 72 hours. Lipid Profile No results for input(s): CHOL, HDL, LDLCALC, TRIG, CHOLHDL, LDLDIRECT in the last 72 hours. Thyroid function studies No results for input(s): TSH, T4TOTAL, T3FREE, THYROIDAB in the last 72 hours.  Invalid input(s): FREET3 Anemia work up Recent Labs    05/04/19 0411  VITAMINB12 154*  FOLATE 6.1  FERRITIN 375*  TIBC 174*  IRON 11*   Urinalysis    Component Value Date/Time   COLORURINE YELLOW 05/01/2019 1420   APPEARANCEUR TURBID (A) 05/01/2019 1420   LABSPEC 1.014 05/01/2019 1420   PHURINE 7.0 05/01/2019 1420   GLUCOSEU NEGATIVE 05/01/2019 1420   HGBUR SMALL (A) 05/01/2019 1420   BILIRUBINUR NEGATIVE 05/01/2019 1420   KETONESUR 5 (A) 05/01/2019 1420   PROTEINUR >=300 (A) 05/01/2019 1420   UROBILINOGEN 1.0 03/20/2014 1241   NITRITE NEGATIVE  05/01/2019 1420   LEUKOCYTESUR MODERATE (A) 05/01/2019 1420   Sepsis Labs Invalid input(s): PROCALCITONIN,  WBC,  LACTICIDVEN Microbiology Recent Results (from the past 240 hour(s))  Urine culture     Status: None   Collection Time: 05/01/19  2:20 PM   Specimen: Urine, Random  Result Value Ref Range Status   Specimen Description   Final    URINE, RANDOM Performed at [facility], [address] [place], [place] [address]    Special Requests   Final    NONE Performed at [facility], [address] [place], [place] [address]    Culture   Final    Multiple bacterial morphotypes present, none predominant. Suggest appropriate recollection if clinically indicated.   Report Status 05/03/2019 FINAL  Final  Blood culture (routine x 2)     Status: Abnormal   Collection Time: 05/01/19  2:37 PM  Specimen: BLOOD RIGHT ARM  Result Value Ref Range Status   Specimen Description   Final    BLOOD RIGHT ARM Performed at [facility], [address], [place], [place] [address]    Special Requests   Final    BOTTLES DRAWN AEROBIC AND ANAEROBIC Blood Culture adequate volume Performed at [facility], [address] [place], [place] [address]    Culture  Setup Time (A)  Final    GRAM VARIABLE ROD IN BOTH AEROBIC AND ANAEROBIC BOTTLES CRITICAL RESULT CALLED TO, READ BACK BY AND VERIFIED WITH: PHARMD [facility] [id number] FCP Performed at [facility], [address], [place], [place] [address]    Culture PROTEUS MIRABILIS (A)  Final   Report Status 05/04/2019 FINAL  Final   Organism ID, Bacteria PROTEUS MIRABILIS  Final      Susceptibility   Proteus mirabilis - MIC*    AMPICILLIN <=2 SENSITIVE Sensitive     CEFAZOLIN <=4 SENSITIVE Sensitive     CEFEPIME <=1 SENSITIVE Sensitive     CEFTAZIDIME <=1 SENSITIVE Sensitive     CEFTRIAXONE <=1 SENSITIVE Sensitive     CIPROFLOXACIN <=0.25 SENSITIVE Sensitive     GENTAMICIN <=1  SENSITIVE Sensitive     IMIPENEM 2 SENSITIVE Sensitive     TRIMETH/SULFA <=20 SENSITIVE Sensitive     AMPICILLIN/SULBACTAM <=2 SENSITIVE Sensitive     PIP/TAZO <=4 SENSITIVE Sensitive     * PROTEUS MIRABILIS  Blood Culture ID Panel (Reflexed)     Status: Abnormal   Collection Time: 05/01/19  2:37 PM  Result Value Ref Range Status   Enterococcus species NOT DETECTED NOT DETECTED Final   Listeria monocytogenes NOT DETECTED NOT DETECTED Final   Staphylococcus species NOT DETECTED NOT DETECTED Final   Staphylococcus aureus (BCID) NOT DETECTED NOT DETECTED Final   Streptococcus species NOT DETECTED NOT DETECTED Final   Streptococcus agalactiae NOT DETECTED NOT DETECTED Final   Streptococcus pneumoniae NOT DETECTED NOT DETECTED Final   Streptococcus pyogenes NOT DETECTED NOT DETECTED Final   Acinetobacter baumannii NOT DETECTED NOT DETECTED Final   Enterobacteriaceae species DETECTED (A) NOT DETECTED Final    Comment: Enterobacteriaceae represent a large family of gram-negative bacteria, not a single organism. CRITICAL RESULT CALLED TO, READ BACK BY AND VERIFIED WITH: PHARMD [facility] [id number] FCP    Enterobacter cloacae complex NOT DETECTED NOT DETECTED Final   Escherichia coli NOT DETECTED NOT DETECTED Final   Klebsiella oxytoca NOT DETECTED NOT DETECTED Final   Klebsiella pneumoniae NOT DETECTED NOT DETECTED Final   Proteus species DETECTED (A) NOT DETECTED Final    Comment: CRITICAL RESULT CALLED TO, READ BACK BY AND VERIFIED WITH: PHARMD [facility] [id number] FCP    Serratia marcescens NOT DETECTED NOT DETECTED Final   Carbapenem resistance NOT DETECTED NOT DETECTED Final   Haemophilus influenzae NOT DETECTED NOT DETECTED Final   Neisseria meningitidis NOT DETECTED NOT DETECTED Final   Pseudomonas aeruginosa NOT DETECTED NOT DETECTED Final   Candida albicans NOT DETECTED NOT DETECTED Final   Candida glabrata NOT DETECTED NOT DETECTED Final   Candida krusei NOT DETECTED NOT  DETECTED Final   Candida parapsilosis NOT DETECTED NOT DETECTED Final   Candida tropicalis NOT DETECTED NOT DETECTED Final    Comment: Performed at [facility], [address], [place], [place] [address]  Blood culture (routine x 2)     Status: Abnormal   Collection Time: 05/01/19  3:15 PM   Specimen: BLOOD  Result Value Ref Range Status   Specimen Description   Final    BLOOD LEFT ANTECUBITAL Performed at [facility], [address], [place], [place] [address]    Special Requests   Final    BOTTLES DRAWN AEROBIC AND ANAEROBIC Blood Culture adequate volume Performed at [facility], [address], [place], [place] [address]    Culture  Setup Time   Final    GRAM NEGATIVE RODS IN BOTH AEROBIC AND ANAEROBIC BOTTLES CRITICAL VALUE NOTED.  VALUE IS CONSISTENT WITH PREVIOUSLY REPORTED AND CALLED VALUE.    Culture (A)  Final    PROTEUS MIRABILIS SUSCEPTIBILITIES PERFORMED ON PREVIOUS CULTURE WITHIN THE LAST 5 DAYS. Performed at [facility], [address], [place], [place] [address]    Report Status 05/04/2019 FINAL  Final  SARS Coronavirus 2 (CEPHEID- Performed in [facility] hospital lab), Hosp Order     Status: None   Collection Time: 05/01/19  4:55 PM   Specimen: Nasopharyngeal Swab  Result Value Ref Range Status   SARS Coronavirus 2 NEGATIVE NEGATIVE Final    Comment: (NOTE) If result is NEGATIVE SARS-CoV-2 target nucleic acids are NOT DETECTED. The SARS-CoV-2 RNA is generally detectable in upper and lower  respiratory specimens during the acute phase of infection. The lowest  concentration of SARS-CoV-2 viral copies this assay can detect is 250  copies / mL. A negative result does not preclude SARS-CoV-2 infection  and should not be used as the sole basis for treatment or other  patient management decisions.  A negative result may occur with  improper specimen collection / handling, submission of specimen other  than  nasopharyngeal swab, presence of viral mutation(s) within the  areas targeted by this assay, and inadequate number of viral copies  (<250 copies / mL). A negative result must be combined with clinical  observations, patient history, and epidemiological information. If result is POSITIVE SARS-CoV-2 target nucleic acids are DETECTED. The SARS-CoV-2 RNA is generally detectable in upper and lower  respiratory specimens dur ing the acute phase of infection.  Positive  results are indicative of active infection with SARS-CoV-2.  Clinical  correlation with patient history and other diagnostic information is  necessary to determine patient infection status.  Positive results do  not rule out bacterial infection or co-infection with other viruses. If result is PRESUMPTIVE POSTIVE SARS-CoV-2 nucleic acids MAY BE PRESENT.   A presumptive positive result was obtained on the submitted specimen  and confirmed on repeat testing.  While 2019 novel coronavirus  (SARS-CoV-2) nucleic acids may be present in the submitted sample  additional confirmatory testing may be necessary for epidemiological  and / or clinical management purposes  to differentiate between  SARS-CoV-2 and other Sarbecovirus currently known to infect humans.  If clinically indicated additional testing with an alternate test  methodology [id number]) is advised. The SARS-CoV-2 RNA is generally  detectable in upper and lower respiratory sp ecimens during the acute  phase of infection. The expected result is Negative. Fact Sheet for Patients:  BoilerBrush.com.cy Fact Sheet for Healthcare Providers: https://pope.com/ This test is not yet approved or cleared by the [place] FDA and has been authorized for detection and/or diagnosis of SARS-CoV-2 by FDA under an Emergency Use Authorization (EUA).  This EUA will remain in effect (meaning this test can be used) for the duration of  the COVID-19 declaration under Section 564(b)(1) of the Act, 21 U.S.C. section 360bbb-3(b)(1), unless the authorization is terminated or revoked  sooner. Performed at [facility], [address] [place], [place] [address]   Culture, Urine     Status: Abnormal   Collection Time: 05/03/19 12:44 PM   Specimen: Urine, Random  Result Value Ref Range Status   Specimen Description   Final    URINE, RANDOM Performed at [facility], [address] [place], [place] [address]    Special Requests   Final    NONE Performed at [facility], [address] [place], [place] [address]    Culture (A)  Final    <10,000 COLONIES/mL INSIGNIFICANT GROWTH Performed at [facility], [address] [place], [place] [address]    Report Status 05/04/2019 FINAL  Final  Culture, blood (Routine X 2) w Reflex to ID Panel     Status: None (Preliminary result)   Collection Time: 05/04/19 11:29 AM   Specimen: BLOOD RIGHT HAND  Result Value Ref Range Status   Specimen Description   Final    BLOOD RIGHT HAND Performed at [facility], [address] [place], [place] [address]    Special Requests   Final    BOTTLES DRAWN AEROBIC AND ANAEROBIC Blood Culture adequate volume Performed at [facility], [address] [place], [place] [address]    Culture   Final    NO GROWTH 2 DAYS Performed at [facility], [address] [place], [place] [address]    Report Status PENDING  Incomplete  Culture, blood (Routine X 2) w Reflex to ID Panel     Status: None (Preliminary result)   Collection Time: 05/04/19 11:34 AM   Specimen: BLOOD LEFT ARM  Result Value Ref Range Status   Specimen Description   Final    BLOOD LEFT ARM Performed at [facility], [address] [place], [place] [address]    Special Requests   Final    BOTTLES DRAWN AEROBIC AND ANAEROBIC Blood Culture results may not be optimal due to an  inadequate volume of blood received in culture bottles Performed at [facility], [address] [place], [place] [address]    Culture   Final    NO GROWTH 2 DAYS Performed at [facility], [address] [place], [place] [address]    Report Status PENDING  Incomplete     Time coordinating discharge: 40 minutes  SIGNED:   Alba CoryBelkys A Jamila Slatten, MD  [facility]

## 2019-05-06 NOTE — Evaluation (Signed)
Occupational Therapy Evaluation Patient Details Name: Joe Durham MRN: [id number] DOB: 1946/06/08 Today's Date: 05/06/2019    History of Present Illness 73 y.o. male with medical history significant of recurrent renal stones and hematuria, CAD status post CABG x3, hypertension and hyperlipidemia, PAT, COPD presented to ER with a complaint of abdominal pressure and dislodging of suprapubic catheter. Pt admitted for sepsis secondary to complicated urinary tract infection, pyelonephritis, Proteus bacteremia, POA   Clinical Impression   Pt was admitted for the above.  At baseline, he is mod I and he is close to that level with AE (supervision).  No further OT is needed at this time    Follow Up Recommendations  No OT follow up    Equipment Recommendations  None recommended by OT    Recommendations for Other Services       Precautions / Restrictions Precautions Precautions: Fall Restrictions Weight Bearing Restrictions: No      Mobility Bed Mobility Overal bed mobility: Modified Independent                Transfers       Sit to Stand: Supervision              Balance                                           ADL either performed or assessed with clinical judgement   ADL Overall ADL's : Needs assistance/impaired                                       General ADL Comments: pt reports R ankle swelled after walking with PT yesterday. He could not get sock off. He does have a sock aide at home, but couldn't remember what it was for or how to use it. Also gave him a long sponge and reacher for adls.  Talked about sitting on toilet to wash feet for increased safety. He doesn't feel a tub seat would work for him.  with AE, pt is at a supervision level to gather items     Vision         Perception     Praxis      Pertinent Vitals/Pain Pain Assessment: Faces Faces Pain Scale: Hurts a little bit Pain Location: R ankle Pain  Descriptors / Indicators: Sore Pain Intervention(s): Limited activity within patient's tolerance;Monitored during session     Hand Dominance Right   Extremity/Trunk Assessment             Communication Communication Communication: No difficulties   Cognition Arousal/Alertness: Awake/alert Behavior During Therapy: WFL for tasks assessed/performed Overall Cognitive Status: Within Functional Limits for tasks assessed                                     General Comments       Exercises     Shoulder Instructions      Home Living Family/patient expects to be discharged to:: Private residence Living Arrangements: Alone Available Help at Discharge: Family;Available PRN/intermittently               Bathroom Shower/Tub: Tub/shower unit   Bathroom Toilet: Standard     Home Equipment: Environmental consultant - 2 wheels;Cane -  single point   Additional Comments: holds to wall to get in.  Doesn't feel he can have a seat in the shower with space      Prior Functioning/Environment Level of Independence: Independent with assistive device(s)                 OT Problem List:        OT Treatment/Interventions:      OT Goals(Current goals can be found in the care plan section) Acute Rehab OT Goals Patient Stated Goal: return to mod I level and less pain with R ankle OT Goal Formulation: All assessment and education complete, DC therapy  OT Frequency:     Barriers to D/C:            Co-evaluation              AM-PAC OT "6 Clicks" Daily Activity     Outcome Measure Help from another person eating meals?: None Help from another person taking care of personal grooming?: A Little Help from another person toileting, which includes using toliet, bedpan, or urinal?: A Little Help from another person bathing (including washing, rinsing, drying)?: A Little Help from another person to put on and taking off regular upper body clothing?: A Little Help from another  person to put on and taking off regular lower body clothing?: A Little 6 Click Score: 19   End of Session    Activity Tolerance: Patient tolerated treatment well Patient left: in bed;with call bell/phone within reach  OT Visit Diagnosis: Pain Pain - Right/Left: Right Pain - part of body: Ankle and joints of foot                Time: 0802-2336 OT Time Calculation (min): 15 min Charges:  OT General Charges $OT Visit: 1 Visit OT Evaluation $OT Eval Low Complexity: 1 Low  Marica Otter, OTR/L Acute Rehabilitation Services [phone] [facility] pager [phone] office 05/06/2019  Hydia Copelin 05/06/2019, 10:23 AM

## 2019-05-06 NOTE — Progress Notes (Signed)
Progress Note  Patient Name: Joe KaufmanGeronimo Guaman Date of Encounter: 05/06/2019  Primary Cardiologist: Thurmon FairMihai Croitoru, MD  Subjective   His cough ha improved, denies dizziness.  Inpatient Medications    Scheduled Meds: . apixaban  5 mg Oral BID  . atorvastatin  80 mg Oral Daily  . benzonatate  200 mg Oral TID  . cyanocobalamin  1,000 mcg Subcutaneous Daily  . losartan  25 mg Oral Daily  . metoprolol tartrate  12.5 mg Oral BID  . mometasone-formoterol  2 puff Inhalation BID  . pantoprazole  40 mg Oral Q0600  . sodium chloride flush  3 mL Intravenous Q12H  . umeclidinium bromide  1 puff Inhalation Daily   Continuous Infusions: . ampicillin (OMNIPEN) IV 2 g (05/06/19 0650)   PRN Meds: acetaminophen **OR** acetaminophen, alum & mag hydroxide-simeth **AND** lidocaine, hydrALAZINE, HYDROcodone-acetaminophen, HYDROcodone-homatropine, levalbuterol, ondansetron **OR** ondansetron (ZOFRAN) IV, sodium chloride flush   Vital Signs    Vitals:   05/06/19 0050 05/06/19 0500 05/06/19 0549 05/06/19 0928  BP:   (!) 143/59   Pulse: 79  75 (!) 57  Resp:   20   Temp:   99.1 F (37.3 C)   TempSrc:   Oral   SpO2:   96%   Weight:  59.9 kg    Height:        Intake/Output Summary (Last 24 hours) at 05/06/2019 1229 Last data filed at 05/06/2019 0930 Gross per 24 hour  Intake 940 ml  Output 1275 ml  Net -335 ml   Last 3 Weights 05/06/2019 05/05/2019 05/04/2019  Weight (lbs) 132 lb 0.9 oz 126 lb 6.4 oz 125 lb 10.6 oz  Weight (kg) 59.9 kg 57.335 kg 57 kg     Telemetry    NSR with second degree AVB type 1, also brief run of atrial tach versus junctional tach - Personally Reviewed  ECG    Appears to show NSR 2nd degree AV block type 1 with deep anterior TWI and QT prolongation - Personally Reviewed  Physical Exam   GEN: No acute distress.  HEENT: Normocephalic, atraumatic, sclera non-icteric. Neck: No JVD or bruits. Cardiac: RRR no murmurs, rubs, or gallops.  Radials/DP/PT 1+ and equal  bilaterally.  Respiratory: Diffuse BS throughout, mild wheezing on inspiration. Breathing is unlabored. GI: Soft, nontender, non-distended, BS +x 4. MS: no deformity. Extremities: No clubbing or cyanosis. No edema. Distal pedal pulses are 2+ and equal bilaterally. Neuro:  AAOx3. Follows commands. Psych:  Responds to questions appropriately with a normal affect.  Labs    Chemistry Recent Labs  Lab 05/01/19 1400 05/02/19 0136  05/04/19 0411 05/05/19 0500 05/06/19 0426  NA 135 136   < > 138 138 138  K 4.2 3.9   < > 3.4* 3.8 3.7  CL 96* 105   < > 110 110 110  CO2 25 22   < > 20* 21* 23  GLUCOSE 109* 99   < > 108* 93 91  BUN 37* 31*   < > 15 10 7*  CREATININE 2.40* 1.60*   < > 1.18 1.16 1.04  CALCIUM 9.8 8.3*   < > 8.2* 8.7* 8.9  PROT 9.1* 6.3*  --   --   --   --   ALBUMIN 4.2 2.8*  --   --   --   --   AST 26 18  --   --   --   --   ALT 16 13  --   --   --   --  ALKPHOS 70 44  --   --   --   --   BILITOT 3.3* 1.8*  --   --   --   --   GFRNONAA 26* 43*   < > >60 >60 >60  GFRAA 30* 49*   < > >60 >60 >60  ANIONGAP 14 9   < > 8 7 5    < > = values in this interval not displayed.     Hematology Recent Labs  Lab 05/04/19 0411 05/05/19 0500 05/06/19 0426  WBC 10.9* 9.7 11.4*  RBC 2.57* 2.55* 2.57*  HGB 9.0* 8.8* 8.9*  HCT 28.1* 27.8* 28.4*  MCV 109.3* 109.0* 110.5*  MCH 35.0* 34.5* 34.6*  MCHC 32.0 31.7 31.3  RDW 14.7 14.7 14.7  PLT 154 186 224    Cardiac EnzymesNo results for input(s): TROPONINI in the last 168 hours. No results for input(s): TROPIPOC in the last 168 hours.   BNPNo results for input(s): BNP, PROBNP in the last 168 hours.   DDimer No results for input(s): DDIMER in the last 168 hours.   Radiology    Dg Chest 2 View  Result Date: 05/04/2019 CLINICAL DATA:  Cough EXAM: CHEST - 2 VIEW COMPARISON:  05/01/2019 FINDINGS: Cardiac shadow is mildly prominent accentuated by the portable technique. Postsurgical changes are noted. Loop recorder is seen. Lungs  are hyperinflated without focal infiltrate. No sizable effusion is seen. No bony abnormality is noted. IMPRESSION: COPD without acute abnormality. Electronically Signed   By: Inez Catalina M.D.   On: 05/04/2019 14:15    Cardiac Studies   2D echo 05/02/19 IMPRESSIONS 1. The left ventricle has normal systolic function, with an ejection fraction of 55-60%. The cavity size was normal. Left ventricular diastolic Doppler parameters are consistent with pseudonormalization. No evidence of left ventricular regional wall  motion abnormalities. 2. The right ventricle has normal systolic function. The cavity was normal. There is no increase in right ventricular wall thickness. 3. Left atrial size was mildly dilated. 4. Right atrial size was mildly dilated. 5. No evidence of mitral valve stenosis. Trivial mitral regurgitation. 6. The aortic valve is tricuspid. Mild calcification of the aortic valve. No stenosis of the aortic valve. 7. The aortic root is normal in size and structure. 8. Normal IVC size with PA systolic pressure 36 mmHg.  Patient Profile     73 y.o. male with CAD s/p CABG 08/2018, PAT, HTN, HLD, cryptogenic stroke 2016 (had ILR at end of service in 2019), Mobitz 1 AV block, PAD (prior abnormal ABIs), carotid artery disease (3-53% RICA, 61-44% LICA in 31/5400), COPD, ongoing tobacco abuse, recurrent renal stones and urinary retention with suprapublic catheter in place admitted with sepsis due to UTI, AKI - also found to be in rapid AF.  Assessment & Plan    1. Sepsis due to complicated UTI/pyelonephritis with proteus bacteremia - AKI felt due to obstruction and sepsis. Suprapubic catheter fell out 2 days prior to admission and patient had abdominal pain and distention. Cr improved.  2. PAF, now in sinus bradycardia with Wenckebach periods, ventricular rates in 50'. I would discontinue metoprolol, the patient is asymptomatic and can be discharged. We will arrange for outpatient  follow up.   3. Macrocytic anemia 4. CAD s/p CABG - no CP reported, but EKG showed significant ST-T changes in precordial leads with QT prolongation this admission -> persisted on EKG yesterday. Will review with MD whether troponin testing is prudent. F/u EKG today. Amiodarone discontinued. 5. PAD - planned  for updated vascular testing as OP and f/u with Dr .Allyson Sabal but patient no showed in 02/2019. I sent our [facility] office a message for them to arrange vascular testing/appt with Dr. Allyson Sabal and to call patient with appt info. 6. Tobacco abuse 7. Reducible inguinal hernia  8. Hypokalemia 9. RLL pulmonary nodule   Tobias Alexander, MD 05/06/2019

## 2019-05-09 LAB — CULTURE, BLOOD (ROUTINE X 2)
Culture: NO GROWTH
Culture: NO GROWTH
Special Requests: ADEQUATE

## 2019-05-18 ENCOUNTER — Telehealth: Payer: Self-pay | Admitting: Cardiology

## 2019-05-18 NOTE — Telephone Encounter (Signed)
Pt cx this appt.

## 2019-05-19 ENCOUNTER — Ambulatory Visit: Payer: Medicare Other | Admitting: Cardiology

## 2019-05-24 ENCOUNTER — Emergency Department (HOSPITAL_COMMUNITY)
Admission: EM | Admit: 2019-05-24 | Discharge: 2019-05-25 | Disposition: A | Discharge status: Home / Self Care | Payer: Medicare Other | Attending: Emergency Medicine | Admitting: Emergency Medicine

## 2019-05-24 ENCOUNTER — Other Ambulatory Visit: Payer: Self-pay

## 2019-05-24 ENCOUNTER — Encounter (HOSPITAL_COMMUNITY): Payer: Self-pay | Admitting: Emergency Medicine

## 2019-05-24 DIAGNOSIS — T83198A Other mechanical complication of other urinary devices and implants, initial encounter: Secondary | ICD-10-CM | POA: Insufficient documentation

## 2019-05-24 DIAGNOSIS — I252 Old myocardial infarction: Secondary | ICD-10-CM | POA: Diagnosis not present

## 2019-05-24 DIAGNOSIS — Y732 Prosthetic and other implants, materials and accessory gastroenterology and urology devices associated with adverse incidents: Secondary | ICD-10-CM | POA: Diagnosis not present

## 2019-05-24 DIAGNOSIS — I10 Essential (primary) hypertension: Secondary | ICD-10-CM | POA: Diagnosis not present

## 2019-05-24 DIAGNOSIS — Z7901 Long term (current) use of anticoagulants: Secondary | ICD-10-CM | POA: Insufficient documentation

## 2019-05-24 DIAGNOSIS — J449 Chronic obstructive pulmonary disease, unspecified: Secondary | ICD-10-CM | POA: Diagnosis not present

## 2019-05-24 DIAGNOSIS — K4090 Unilateral inguinal hernia, without obstruction or gangrene, not specified as recurrent: Secondary | ICD-10-CM | POA: Diagnosis not present

## 2019-05-24 DIAGNOSIS — I480 Paroxysmal atrial fibrillation: Secondary | ICD-10-CM | POA: Diagnosis not present

## 2019-05-24 DIAGNOSIS — Z79899 Other long term (current) drug therapy: Secondary | ICD-10-CM | POA: Insufficient documentation

## 2019-05-24 DIAGNOSIS — F17210 Nicotine dependence, cigarettes, uncomplicated: Secondary | ICD-10-CM | POA: Insufficient documentation

## 2019-05-24 DIAGNOSIS — N3000 Acute cystitis without hematuria: Secondary | ICD-10-CM | POA: Diagnosis not present

## 2019-05-24 DIAGNOSIS — T83010A Breakdown (mechanical) of cystostomy catheter, initial encounter: Secondary | ICD-10-CM

## 2019-05-24 DIAGNOSIS — Z951 Presence of aortocoronary bypass graft: Secondary | ICD-10-CM | POA: Insufficient documentation

## 2019-05-24 DIAGNOSIS — Z8673 Personal history of transient ischemic attack (TIA), and cerebral infarction without residual deficits: Secondary | ICD-10-CM | POA: Diagnosis not present

## 2019-05-24 LAB — CBC WITH DIFFERENTIAL/PLATELET
Abs Immature Granulocytes: 0.01 10*3/uL (ref 0.00–0.07)
Basophils Absolute: 0.1 10*3/uL (ref 0.0–0.1)
Basophils Relative: 1 %
Eosinophils Absolute: 0.3 10*3/uL (ref 0.0–0.5)
Eosinophils Relative: 4 %
HCT: 37.1 % — ABNORMAL LOW (ref 39.0–52.0)
Hemoglobin: 12 g/dL — ABNORMAL LOW (ref 13.0–17.0)
Immature Granulocytes: 0 %
Lymphocytes Relative: 18 %
Lymphs Abs: 1.3 10*3/uL (ref 0.7–4.0)
MCH: 36.3 pg — ABNORMAL HIGH (ref 26.0–34.0)
MCHC: 32.3 g/dL (ref 30.0–36.0)
MCV: 112.1 fL — ABNORMAL HIGH (ref 80.0–100.0)
Monocytes Absolute: 0.9 10*3/uL (ref 0.1–1.0)
Monocytes Relative: 12 %
Neutro Abs: 4.6 10*3/uL (ref 1.7–7.7)
Neutrophils Relative %: 65 %
Platelets: 274 10*3/uL (ref 150–400)
RBC: 3.31 MIL/uL — ABNORMAL LOW (ref 4.22–5.81)
RDW: 15.1 % (ref 11.5–15.5)
WBC: 7 10*3/uL (ref 4.0–10.5)
nRBC: 0 % (ref 0.0–0.2)

## 2019-05-24 LAB — URINALYSIS, ROUTINE W REFLEX MICROSCOPIC
Bilirubin Urine: NEGATIVE
Glucose, UA: NEGATIVE mg/dL
Ketones, ur: NEGATIVE mg/dL
Nitrite: POSITIVE — AB
Protein, ur: 100 mg/dL — AB
Specific Gravity, Urine: 1.011 (ref 1.005–1.030)
WBC, UA: >50 WBC/hpf — ABNORMAL HIGH (ref 0–5)
pH: 8 (ref 5.0–8.0)

## 2019-05-24 LAB — COMPREHENSIVE METABOLIC PANEL
ALT: 12 U/L (ref 0–44)
AST: 18 U/L (ref 15–41)
Albumin: 3.6 g/dL (ref 3.5–5.0)
Alkaline Phosphatase: 92 U/L (ref 38–126)
Anion gap: 10 (ref 5–15)
BUN: 14 mg/dL (ref 8–23)
CO2: 25 mmol/L (ref 22–32)
Calcium: 9.4 mg/dL (ref 8.9–10.3)
Chloride: 103 mmol/L (ref 98–111)
Creatinine, Ser: 1.19 mg/dL (ref 0.61–1.24)
GFR calc Af Amer: >60 mL/min (ref >60)
GFR calc non Af Amer: >60 mL/min (ref >60)
Glucose, Bld: 93 mg/dL (ref 70–99)
Potassium: 4 mmol/L (ref 3.5–5.1)
Sodium: 138 mmol/L (ref 135–145)
Total Bilirubin: 0.8 mg/dL (ref 0.3–1.2)
Total Protein: 7.9 g/dL (ref 6.5–8.1)

## 2019-05-24 MED ORDER — CEPHALEXIN 500 MG PO CAPS
500.0000 mg | ORAL_CAPSULE | Freq: Once | ORAL | Status: AC
  Administered 2019-05-25: 500 mg via ORAL
  Filled 2019-05-24: qty 1

## 2019-05-24 MED ORDER — CEPHALEXIN 500 MG PO CAPS: 500.0000 mg | ORAL_CAPSULE | Freq: Four times a day (QID) | ORAL | 0 refills | Status: DC

## 2019-05-24 NOTE — ED Provider Notes (Signed)
[facility]-EMERGENCY DEPT Provider Note   CSN: [id number] Arrival date & time: 05/24/19  2115    History   Chief Complaint No chief complaint on file.   HPI Joe Durham is a 73 y.o. male.     Pt presents to the ED with his suprapubic catheter leaking.  The pt used to have a 25F catheter, but had a 3F placed at his last visit.  Since then, he has been leaking urine around the catheter and sometimes down his urethra.  He was admitted for sepsis from a uti from proteus from 6/26-7/1.  The pt also had new onset afib and was put on Eliquis.  Pt denies f/c.  No sob or cough.  CHA2DS2/VAS Stroke Risk Points  Current as of 11 minutes ago     5 >= 2 Points: High Risk  1 - 1.99 Points: Medium Risk  0 Points: Low Risk    The patient's score has not changed in the past year.: No Change     Details    This score determines the patient's risk of having a stroke if the  patient has atrial fibrillation.       Points Metrics  0 Has Congestive Heart Failure:  No    Current as of 11 minutes ago  1 Has Vascular Disease:  Yes    Current as of 11 minutes ago  1 Has Hypertension:  Yes    Current as of 11 minutes ago  1 Age:  1969    Current as of 11 minutes ago  0 Has Diabetes:  No    Current as of 11 minutes ago  2 Had Stroke:  Yes  Had TIA:  No  Had thromboembolism:  No    Current as of 11 minutes ago  0 Male:  No    Current as of 11 minutes ago              Past Medical History:  Diagnosis Date  . Bladder calculi   . COPD (chronic obstructive pulmonary disease) (HCC)   . DOE (dyspnea on exertion)   . Elevated blood pressure reading    12-22-2018 reprots he hasnt had his BP meds since this past thursday   . Foley catheter in place   . History of bladder stone   . 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 History of urinary retention   .  Hypertension   . Mobitz type 1 second degree atrioventricular block   . Osteoarthritis   . Paroxysmal atrial tachycardia (HCC)   . Peripheral vascular disease (HCC)    last duplex 09-25-2016 bilateral ABI 0.6 and occulsion of  right popiteal posterior tibials and left peroneal  . Wears glasses     Patient Active Problem List   Diagnosis Date Noted  . B12 deficiency 05/04/2019  . Left inguinal hernia 05/04/2019  . Cough   . Anemia   . Bacteremia due to Gram-negative bacteria 05/02/2019  . Sepsis (HCC) 05/01/2019  . New onset a-fib (HCC) 05/01/2019  . Pre-operative clearance 12/15/2018  . 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 Coronary artery disease 09/05/2018  . Coronary artery disease involving native heart without angina pectoris   . NSTEMI (non-ST elevated myocardial infarction) (HCC) 09/03/2018  . Malnutrition of moderate degree 09/03/2018  . History of loop recorder 01/23/2018  . Mobitz type 1 second  degree AV block 07/30/2017  . PAD (peripheral artery disease) (HCC) 07/30/2017  . Bladder calculi 07/15/2017  . UTI (urinary tract infection) 06/13/2017  . Acute cystitis with hematuria   . Gross hematuria 06/12/2017  . Renal insufficiency 06/12/2017  . Complicated UTI (urinary tract infection) 06/12/2017  . Acute pyelonephritis 06/12/2017  . Uncomplicated alcohol dependence (HCC)   . AKI (acute kidney injury) (HCC)   . Claudication (HCC) 11/14/2016  . Encounter for loop recorder check 07/19/2015  . First degree AV block 07/19/2015  . PAT (paroxysmal atrial tachycardia) (HCC) 07/19/2015  . Essential hypertension 04/05/2015  . Tobacco use disorder 04/05/2015  . Cocaine abuse (HCC) 04/05/2015  . Hospital discharge follow-up 01/10/2015  . 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 Lung mass   . Acute embolic stroke (HCC) 01/04/2015  . Cerebral infarction due to unspecified mechanism   . Visual changes   . Substance abuse (HCC)   .  Dyslipidemia, goal LDL below 70   . CVA (cerebral infarction) 01/03/2015  . Tobacco abuse 04/12/2014  . Bladder stones 04/12/2014    Past Surgical History:  Procedure Laterality Date  . CORONARY ARTERY BYPASS GRAFT N/A 09/05/2018   Procedure: CORONARY ARTERY BYPASS GRAFTING (CABG) times 3 using left internal mammary artery and right greater saphenous vein harvested endoscopically.;  Surgeon: Kerin Perna, MD;  Location: [facility] OR;  Service: Open Heart Surgery;  Laterality: N/A;  . CYSTOSCOPY WITH LITHOLAPAXY N/A 07/15/2017   Procedure: OPEN CYSTOLITHOTOMY;  Surgeon: Crista Elliot, MD;  Location: [facility] ORS;  Service: Urology;  Laterality: N/A;  . INGUINAL HERNIA REPAIR Right 2011  . INSERTION OF SUPRAPUBIC CATHETER N/A 07/15/2017   Procedure: INSERTION OF SUPRAPUBIC CATHETER;  Surgeon: Crista Elliot, MD;  Location: [facility] ORS;  Service: Urology;  Laterality: N/A;  . LEFT HEART CATH AND CORONARY ANGIOGRAPHY N/A 09/03/2018   Procedure: LEFT HEART CATH AND CORONARY ANGIOGRAPHY;  Surgeon: Iran Ouch, MD;  Location: [facility] INVASIVE CV LAB;  Service: Cardiovascular;  Laterality: N/A;  . LOOP RECORDER IMPLANT N/A 01/05/2015   Procedure: LOOP RECORDER IMPLANT;  Surgeon: Thurmon Fair, MD;  Location: [facility] CATH LAB;  Service: Cardiovascular;  Laterality: N/A;  . ORIF WRIST FRACTURE Left 03/20/2014   Procedure: OPEN REDUCTION INTERNAL FIXATION (ORIF) WRIST FRACTURE;  Surgeon: Dominica Severin, MD;  Location: [facility] OR;  Service: Orthopedics;  Laterality: Left;  . TEE WITHOUT CARDIOVERSION N/A 01/05/2015   Procedure: TRANSESOPHAGEAL ECHOCARDIOGRAM (TEE)/LOOP;  Surgeon: Thurmon Fair, MD;  Location: [facility] ENDOSCOPY;  Service: Cardiovascular;  Laterality: N/A; ef 60-65%, no LA or RA thrombus, trivial PR, mild TR  . TEE WITHOUT CARDIOVERSION N/A 09/05/2018   Procedure: TRANSESOPHAGEAL ECHOCARDIOGRAM (TEE);  Surgeon: Donata Clay, Theron Arista, MD;  Location: [facility] OR;  Service: Open Heart Surgery;  Laterality: N/A;  . TONSILLECTOMY   child  . TOTAL HIP ARTHROPLASTY Right 2000        Home Medications    Prior to Admission medications   Medication Sig Start Date End Date Taking? Authorizing Provider  apixaban (ELIQUIS) 5 MG TABS tablet Take 1 tablet (5 mg total) by mouth 2 (two) times daily. 05/06/19   Regalado, Belkys A, MD  atorvastatin (LIPITOR) 80 MG tablet Take 1 tablet (80 mg total) by mouth daily. 12/15/18   Abelino Derrick, PA-C  benzonatate (TESSALON) 200 MG capsule Take 1 capsule (200 mg total) by mouth 3 (three) times daily. 05/06/19   Regalado, Prentiss Bells, MD  cephALEXin (  KEFLEX) 500 MG capsule Take 1 capsule (500 mg total) by mouth 4 (four) times daily. 05/24/19   Isla Pence, MD  lisinopril (ZESTRIL) 2.5 MG tablet Take 1 tablet (2.5 mg total) by mouth daily. 12/15/18   Kilroy, Doreene Burke, PA-C  mometasone-formoterol (DULERA) 100-5 MCG/ACT AERO Inhale 2 puffs into the lungs 2 (two) times daily. 05/06/19   Regalado, Belkys A, MD  pantoprazole (PROTONIX) 40 MG tablet Take 1 tablet (40 mg total) by mouth daily at 6 (six) AM. 05/07/19   Regalado, Belkys A, MD  umeclidinium bromide (INCRUSE ELLIPTA) 62.5 MCG/INH AEPB Inhale 1 puff into the lungs daily. 05/07/19   Regalado, Belkys A, MD  vitamin B-12 (CYANOCOBALAMIN) 100 MCG tablet Take 1 tablet (100 mcg total) by mouth daily. 05/06/19 05/05/20  Regalado, Cassie Freer, MD    Family History Family History  Problem Relation Age of Onset  . Stroke Mother   . Stroke Sister     Social History Social History   Tobacco Use  . Smoking status: Current Every Day Smoker    Years: 52.00    Types: Cigarettes  . Smokeless tobacco: Never Used  . Tobacco comment: 07-08-2018 pt is down to 1ppw from 1 ppd, 12-22-2018 about  half a pack per day   Substance Use Topics  . Alcohol use: Yes    Alcohol/week: 0.0 standard drinks    Comment: 08/2018 ! drink 1 pint a day 3 times a week on average  . Drug use: No    Comment: history of cocaine ; last use 7 months ago      Allergies   Lactose  intolerance (gi)   Review of Systems Review of Systems  Genitourinary: Positive for difficulty urinating.  All other systems reviewed and are negative.    Physical Exam Updated Vital Signs BP (!) 177/73   Pulse 60   Temp 98.1 F (36.7 C) (Oral)   Ht 5\' 7"  (1.702 m)   Wt 59 kg   SpO2 100%   BMI 20.36 kg/m   Physical Exam Vitals signs and nursing note reviewed.  Constitutional:      Appearance: Normal appearance. He is underweight.  HENT:     Head: Normocephalic and atraumatic.     Right Ear: External ear normal.     Left Ear: External ear normal.     Nose: Nose normal.     Mouth/Throat:     Mouth: Mucous membranes are moist.     Pharynx: Oropharynx is clear.  Eyes:     Extraocular Movements: Extraocular movements intact.     Conjunctiva/sclera: Conjunctivae normal.     Pupils: Pupils are equal, round, and reactive to light.  Neck:     Musculoskeletal: Normal range of motion and neck supple.  Cardiovascular:     Rate and Rhythm: Normal rate and regular rhythm.     Pulses: Normal pulses.     Heart sounds: Normal heart sounds.  Pulmonary:     Effort: Pulmonary effort is normal.     Breath sounds: Normal breath sounds.  Abdominal:     Hernia: A hernia is present. Hernia is present in the left inguinal area.     Comments: Suprapubic catheter in place  Musculoskeletal: Normal range of motion.  Skin:    General: Skin is warm.     Capillary Refill: Capillary refill takes less than 2 seconds.  Neurological:     General: No focal deficit present.     Mental Status: He is alert and oriented  to person, place, and time.  Psychiatric:        Mood and Affect: Mood normal.        Behavior: Behavior normal.      ED Treatments / Results  Labs (all labs ordered are listed, but only abnormal results are displayed) Labs Reviewed  URINALYSIS, ROUTINE W REFLEX MICROSCOPIC - Abnormal; Notable for the following components:      Result Value   APPearance CLOUDY (*)    Hgb  urine dipstick SMALL (*)    Protein, ur 100 (*)    Nitrite POSITIVE (*)    Leukocytes,Ua LARGE (*)    WBC, UA >50 (*)    Bacteria, UA MANY (*)    All other components within normal limits  CBC WITH DIFFERENTIAL/PLATELET - Abnormal; Notable for the following components:   RBC 3.31 (*)    Hemoglobin 12.0 (*)    HCT 37.1 (*)    MCV 112.1 (*)    MCH 36.3 (*)    All other components within normal limits  URINE CULTURE  COMPREHENSIVE METABOLIC PANEL    EKG None  Radiology No results found.  Procedures BLADDER CATHETERIZATION  Date/Time: 05/24/2019 10:33 PM Performed by: Jacalyn LefevreHaviland, Mairin Lindsley, MD Authorized by: Jacalyn LefevreHaviland, Cherrelle Plante, MD   Consent:    Consent obtained:  Verbal   Consent given by:  Patient   Risks discussed:  False passage, infection and pain   Alternatives discussed:  No treatment Pre-procedure details:    Preparation: Patient was prepped and draped in usual sterile fashion   Anesthesia (see MAR for exact dosages):    Anesthesia method:  None Procedure details:    Provider performed due to:  Complicated insertion   Catheter insertion:  Indwelling   Catheter type:  Foley   Catheter size:  16 Fr   Bladder irrigation: no     Number of attempts:  1   Urine characteristics:  Cloudy Post-procedure details:    Patient tolerance of procedure:  Tolerated well, no immediate complications Comments:     Suprapubic catheter placed without difficulty.  Urine draining well.   (including critical care time)  Medications Ordered in ED Medications  cephALEXin (KEFLEX) capsule 500 mg (has no administration in time range)     Initial Impression / Assessment and Plan / ED Course  I have reviewed the triage vital signs and the nursing notes.  Pertinent labs & imaging results that were available during my care of the patient were reviewed by me and considered in my medical decision making (see chart for details).      Suprapubic catheter replaced.  UA looks like UTI, so he  will be started on keflex.  Urine sent for cx.  His other labs are ok.  Pt has no fever.  He is stable for d/c.  Return if worse.  F/u with urology.  The pt also has a large inguinal hernia.  He was seen by surgery as an inpatient.  They will follow as an outpatient.   Final Clinical Impressions(s) / ED Diagnoses   Final diagnoses:  Suprapubic catheter dysfunction, initial encounter (HCC)  Acute cystitis without hematuria  Unilateral inguinal hernia without obstruction or gangrene, recurrence not specified    ED Discharge Orders         Ordered    cephALEXin (KEFLEX) 500 MG capsule  4 times daily     05/24/19 2353           Jacalyn LefevreHaviland, Ruble Buttler, MD 05/24/19 2358

## 2019-05-24 NOTE — ED Notes (Signed)
Pt went to restroom

## 2019-05-24 NOTE — ED Notes (Addendum)
Pt brought the catheter he wore prior to his last hospital visit which is a 16 fr. Currently, he has an 14 fr.

## 2019-05-24 NOTE — ED Triage Notes (Signed)
[facility] transported the pt from home and reports the following:  Catheter displaced 2 days ago. Last week it was leaking. Has been constipated for 2 days.

## 2019-05-25 DIAGNOSIS — T83198A Other mechanical complication of other urinary devices and implants, initial encounter: Secondary | ICD-10-CM | POA: Diagnosis not present

## 2019-05-27 LAB — URINE CULTURE: Culture: >=100000 — AB

## 2019-05-28 ENCOUNTER — Telehealth: Payer: Self-pay

## 2019-05-28 NOTE — Telephone Encounter (Signed)
Called for Symptom check on ED visit 05/25/2019 per Dr Eulis Foster. Pt states no further problems. Denies fevers, no difficulty with cath or passing urine.  No abx change

## 2019-05-29 ENCOUNTER — Telehealth: Payer: Self-pay | Admitting: Cardiology

## 2019-05-29 NOTE — Telephone Encounter (Signed)
I called pt to remind him of his appt on 05-31-26. I also asked pt to call back and be pre-screened for COVVID-19.

## 2019-06-01 ENCOUNTER — Ambulatory Visit (INDEPENDENT_AMBULATORY_CARE_PROVIDER_SITE_OTHER): Payer: Medicare Other | Admitting: Cardiology

## 2019-06-01 ENCOUNTER — Other Ambulatory Visit: Payer: Self-pay

## 2019-06-01 ENCOUNTER — Telehealth: Payer: Self-pay | Admitting: Cardiology

## 2019-06-01 VITALS — HR 63 | Temp 97.7°F | Ht 67.0 in | Wt 117.0 lb

## 2019-06-01 DIAGNOSIS — Z7901 Long term (current) use of anticoagulants: Secondary | ICD-10-CM

## 2019-06-01 DIAGNOSIS — Z951 Presence of aortocoronary bypass graft: Secondary | ICD-10-CM

## 2019-06-01 DIAGNOSIS — I441 Atrioventricular block, second degree: Secondary | ICD-10-CM

## 2019-06-01 DIAGNOSIS — I4891 Unspecified atrial fibrillation: Secondary | ICD-10-CM | POA: Diagnosis not present

## 2019-06-01 DIAGNOSIS — A419 Sepsis, unspecified organism: Secondary | ICD-10-CM

## 2019-06-01 DIAGNOSIS — N179 Acute kidney failure, unspecified: Secondary | ICD-10-CM

## 2019-06-01 DIAGNOSIS — R6520 Severe sepsis without septic shock: Secondary | ICD-10-CM

## 2019-06-01 DIAGNOSIS — Z7689 Persons encountering health services in other specified circumstances: Secondary | ICD-10-CM

## 2019-06-01 DIAGNOSIS — J449 Chronic obstructive pulmonary disease, unspecified: Secondary | ICD-10-CM | POA: Insufficient documentation

## 2019-06-01 DIAGNOSIS — Z8673 Personal history of transient ischemic attack (TIA), and cerebral infarction without residual deficits: Secondary | ICD-10-CM

## 2019-06-01 DIAGNOSIS — Z720 Tobacco use: Secondary | ICD-10-CM | POA: Diagnosis not present

## 2019-06-01 DIAGNOSIS — I739 Peripheral vascular disease, unspecified: Secondary | ICD-10-CM

## 2019-06-01 NOTE — Telephone Encounter (Signed)

## 2019-06-01 NOTE — Patient Instructions (Addendum)
Medication Instructions:  Your physician recommends that you continue on your current medications as directed. Please refer to the Current Medication list given to you today. If you need a refill on your cardiac medications before your next appointment, please call your pharmacy.   Lab work: NONE  If you have labs (blood work) drawn today and your tests are completely normal, you will receive your results only by: Marland Kitchen MyChart Message (if you have MyChart) OR . A paper copy in the mail If you have any lab test that is abnormal or we need to change your treatment, we will call you to review the results.  Testing/Procedures: NONE   Follow-Up: At [facility], you and your health needs are our priority.  As part of our continuing mission to provide you with exceptional heart care, we have created designated Provider Care Teams.  These Care Teams include your primary Cardiologist (physician) and Advanced Practice Providers (APPs -  Physician Assistants and Nurse Practitioners) who all work together to provide you with the care you need, when you need it. You will need a follow up appointment in 3 months.  Please call our office 2 months in advance to schedule this appointment.  You may see Sanda Klein, MD or one of the following Advanced Practice Providers on your designated Care Team: [place], [place] . Fabian Sharp, PA-C  Any Other Special Instructions Will Be Listed Below (If Applicable). You have been referred to [facility] for a Primary Care Physician. The phone number is [phone].

## 2019-06-01 NOTE — Assessment & Plan Note (Signed)
LIMA to LAD SVG to OM SVG to RCA

## 2019-06-01 NOTE — Assessment & Plan Note (Signed)
Needs PV evaluation (no showed in April)

## 2019-06-02 DIAGNOSIS — Z7901 Long term (current) use of anticoagulants: Secondary | ICD-10-CM | POA: Insufficient documentation

## 2019-06-02 NOTE — Assessment & Plan Note (Signed)
Pt hospitalized 05/01/2019-05/06/2019 with urosepsis (proteus)  secondary to stones

## 2019-06-02 NOTE — Assessment & Plan Note (Signed)
2016

## 2019-06-02 NOTE — Progress Notes (Signed)
Cardiology Office Note:    Date:  06/02/2019   ID:  Joe Durham, DOB [birth year], MRN [id number]  PCP:  Patient, No Pcp Per  Cardiologist:  Sanda Klein, MD  Electrophysiologist:  None   Referring MD: No ref. provider found   No chief complaint on file. [facility]  History of Present Illness:    Joe Durham is a 73 y.o. male with a hx of CAD who was seen in consult for AF with RVR in the setting of urosepsis 05/01/2019.  The patient underwent bypass surgery x3 Oct 2019 with an LIMA to LAD, SVG to OM, SVG to RCA. Ultimately he recovered well. He was seen in the office in November for follow-up was doing well at that time. He was last seen in the clinic for a preoperative clearance prior to TURP. He has h/o recurrent renal stones and suprapubic catheter in place.  Other medical issues include PAT, prior right posterior cerebral artery embolism stroke in 2016, known peripheral vascular disease, hypertension, dyslipidemia, smoking, and transient Mobitz 1 AV block.   He was admitted 05/01/2019 with fever, abdominal pressure and dislodging of suprapubic catheter. He has also been having some shortness of breath with exertion. He was found to be in atrial fibrillation with RVR and cardiology was consulted. He was started on Heparin and Amiodarone.  Echo showed normal LVF with mild LAE. His rhythm converted to AV block type 1. Amiodarone was stopped and Coreg decreased.  He was transitioned to Eliquis. By discharge he was noted to be bradycardic and beta blocker was stopped.   He was seen 06/01/2019 in follow up.  He was doing well, no complaints of tachycardia or fever. He is frail appearing.    Past Medical History:  Diagnosis Date  . Bladder calculi   . COPD (chronic obstructive pulmonary disease) ([place])   . DOE (dyspnea on exertion)   . Elevated blood pressure reading    12-22-2018 reprots he hasnt had his BP meds since this past thursday   . Foley catheter in place   . History  of bladder stone   . History of embolic stroke 71/2458   right PCA emobolism ischemic infarct (cyptogenic)  s/p LOOP recorder 01-05-2015;  07-08-2018 per pt no residual  . History of urinary retention   . Hypertension   . Mobitz type 1 second degree atrioventricular block   . Osteoarthritis   . Paroxysmal atrial tachycardia ([place])   . Peripheral vascular disease ([place])    last duplex 09-25-2016 bilateral ABI 0.6 and occulsion of  right popiteal posterior tibials and left peroneal  . Wears glasses     Past Surgical History:  Procedure Laterality Date  . CORONARY ARTERY BYPASS GRAFT N/A 09/05/2018   Procedure: CORONARY ARTERY BYPASS GRAFTING (CABG) times 3 using left internal mammary artery and right greater saphenous vein harvested endoscopically.;  Surgeon: Ivin Poot, MD;  Location: [facility];  Service: Open Heart Surgery;  Laterality: N/A;  . CYSTOSCOPY WITH LITHOLAPAXY N/A 07/15/2017   Procedure: OPEN CYSTOLITHOTOMY;  Surgeon: Lucas Mallow, MD;  Location: [facility];  Service: Urology;  Laterality: N/A;  . INGUINAL HERNIA REPAIR Right 2011  . INSERTION OF SUPRAPUBIC CATHETER N/A 07/15/2017   Procedure: INSERTION OF SUPRAPUBIC CATHETER;  Surgeon: Lucas Mallow, MD;  Location: [facility];  Service: Urology;  Laterality: N/A;  . LEFT HEART CATH AND CORONARY ANGIOGRAPHY N/A 09/03/2018   Procedure: LEFT HEART CATH AND CORONARY ANGIOGRAPHY;  Surgeon: Wellington Hampshire, MD;  [facility]: [facility];  Service: Cardiovascular;  Laterality: N/A;  . LOOP RECORDER IMPLANT N/A 01/05/2015   Procedure: LOOP RECORDER IMPLANT;  Surgeon: Thurmon FairMihai Croitoru, MD;  Location: [facility];  Service: Cardiovascular;  Laterality: N/A;  . ORIF WRIST FRACTURE Left 03/20/2014   Procedure: OPEN REDUCTION INTERNAL FIXATION (ORIF) WRIST FRACTURE;  Surgeon: Dominica SeverinWilliam Gramig, MD;  Location: [facility];  Service: Orthopedics;  Laterality: Left;  . TEE WITHOUT CARDIOVERSION N/A 01/05/2015   Procedure: TRANSESOPHAGEAL  ECHOCARDIOGRAM (TEE)/LOOP;  Surgeon: Thurmon FairMihai Croitoru, MD;  Location: [facility];  Service: Cardiovascular;  Laterality: N/A; ef 60-65%, no LA or RA thrombus, trivial PR, mild TR  . TEE WITHOUT CARDIOVERSION N/A 09/05/2018   Procedure: TRANSESOPHAGEAL ECHOCARDIOGRAM (TEE);  Surgeon: Donata ClayVan Trigt, Theron AristaPeter, MD;  [facility];  Service: Open Heart Surgery;  Laterality: N/A;  . TONSILLECTOMY  child  . TOTAL HIP ARTHROPLASTY Right 2000    Current Medications: Current Meds  Medication Sig  . apixaban (ELIQUIS) 5 MG TABS tablet Take 1 tablet (5 mg total) by mouth 2 (two) times daily.  Marland Kitchen. atorvastatin (LIPITOR) 80 MG tablet Take 1 tablet (80 mg total) by mouth daily.  . benzonatate (TESSALON) 200 MG capsule Take 1 capsule (200 mg total) by mouth 3 (three) times daily.  . cephALEXin (KEFLEX) 500 MG capsule Take 1 capsule (500 mg total) by mouth 4 (four) times daily.  Marland Kitchen. lisinopril (ZESTRIL) 2.5 MG tablet Take 1 tablet (2.5 mg total) by mouth daily.  . mometasone-formoterol (DULERA) 100-5 MCG/ACT AERO Inhale 2 puffs into the lungs 2 (two) times daily.  . pantoprazole (PROTONIX) 40 MG tablet Take 1 tablet (40 mg total) by mouth daily at 6 (six) AM.  . umeclidinium bromide (INCRUSE ELLIPTA) 62.5 MCG/INH AEPB Inhale 1 puff into the lungs daily.  . vitamin B-12 (CYANOCOBALAMIN) 100 MCG tablet Take 1 tablet (100 mcg total) by mouth daily.     Allergies:   Lactose intolerance (gi)   Social History   Socioeconomic History  . Marital status: Single    Spouse name: Not on file  . Number of children: 4  . Years of education: 9  . Highest education level: Not on file  Occupational History  . Occupation: retired    Comment: Training and development officerconstruction  Social Needs  . Financial resource strain: Not on file  . Food insecurity    Worry: Not on file    Inability: Not on file  . Transportation needs    Medical: Not on file    Non-medical: Not on file  Tobacco Use  . Smoking status: Current Every Day Smoker    Years:  52.00    Types: Cigarettes  . Smokeless tobacco: Never Used  . Tobacco comment: 07-08-2018 pt is down to 1ppw from 1 ppd, 12-22-2018 about  half a pack per day   Substance and Sexual Activity  . Alcohol use: Yes    Alcohol/week: 0.0 standard drinks    Comment: 08/2018 ! drink 1 pint a day 3 times a week on average  . Drug use: No    Comment: history of cocaine ; last use 7 months ago   . Sexual activity: Not on file  Lifestyle  . Physical activity    Days per week: Not on file    Minutes per session: Not on file  . Stress: Not on file  Relationships  . Social Musicianconnections    Talks on phone: Not on file    Gets together: Not on file  Attends religious service: Not on file    Active member of club or organization: Not on file    Attends meetings of clubs or organizations: Not on file    Relationship status: Not on file  Other Topics Concern  . Not on file  Social History Narrative   Single   Right handed   Caffeine use - sodas on weekends with alcohol     Family History: The patient's family history includes Stroke in his mother and sister.  ROS:   Please see the history of present illness.     All other systems reviewed and are negative.  EKGs/Labs/Other Studies Reviewed:    The following studies were reviewed today: Echo July 2020  EKG:  EKG is ordered today.  The ekg ordered today demonstrates Wenkeback, QTc 514-rate 72  Recent Labs: 05/02/2019: TSH 0.714 05/05/2019: Magnesium 2.1 05/24/2019: ALT 12; BUN 14; Creatinine, Ser 1.19; Hemoglobin 12.0; Platelets 274; Potassium 4.0; Sodium 138  Recent Lipid Panel    Component Value Date/Time   CHOL 173 03/27/2018 1306   TRIG 49 03/27/2018 1306   HDL 76 03/27/2018 1306   CHOLHDL 2.3 03/27/2018 1306   CHOLHDL 3.9 01/04/2015 0855   VLDL 16 01/04/2015 0855   LDLCALC 87 03/27/2018 1306    Physical Exam:    VS:  Pulse 63   Temp 97.7 F (36.5 C)   Ht 5\' 7"  (1.702 m)   Wt 117 lb (53.1 kg)   SpO2 97%   BMI 18.32  kg/m     Wt Readings from Last 3 Encounters:  06/01/19 117 lb (53.1 kg)  05/24/19 130 lb (59 kg)  05/06/19 132 lb 0.9 oz (59.9 kg)     GEN: Thin AA male well developed in no acute distress HEENT: Normal NECK: No JVD; No carotid bruits LYMPHATICS: No lymphadenopathy CARDIAC: RRR, no murmurs, rubs, gallops RESPIRATORY:  Clear to auscultation without rales, wheezing or rhonchi  ABDOMEN: Soft, non-tender, non-distended MUSCULOSKELETAL:  No edema; No deformity  SKIN: Warm and dry NEUROLOGIC:  Alert and oriented x 3 PSYCHIATRIC:  Normal affect   ASSESSMENT:    S/P CABG x 07 Sep 2018 LIMA to LAD SVG to OM SVG to RCA  Claudication [facility](HCC) Needs PV evaluation (no showed in April)  New onset a-fib [facility](HCC) During admission July 2020 for urosepsis- converted to NSR/ Wenkebach  Sepsis [facility](HCC) Pt hospitalized 05/01/2019-05/06/2019 with urosepsis (proteus)  secondary to stones  History of stroke 2016  Mobitz type 1 second degree AV block Asymptomatic-beta blocker stopped  COPD (chronic obstructive pulmonary disease) (HCC) Mild by PFTs Oct 2019  Chronic anticoagulation Eliquis added July 2020  PLAN:    Will see if PV eval has been arranged. F/U Dr Royann Shiversroitoru as scheduled.   Medication Adjustments/Labs and Tests Ordered: Current medicines are reviewed at length with the patient today.  Concerns regarding medicines are outlined above.  Orders Placed This Encounter  Procedures  . [name]  . EKG 12-Lead   No orders of the defined types were placed in this encounter.   Patient Instructions  Medication Instructions:  Your physician recommends that you continue on your current medications as directed. Please refer to the Current Medication list given to you today. If you need a refill on your cardiac medications before your next appointment, please call your pharmacy.   Lab work: NONE  If you have labs (blood work) drawn today and your tests are  completely normal, you will receive your results only  by: . MyChart Message (if you have MyChart) OR . A paper copy in the mail If you have any lab test that is abnormal or we need to change your treatment, we will call you to review the results.  Testing/Procedures: NONE   Follow-Up: At [facility], you and your health needs are our priority.  As part of our continuing mission to provide you with exceptional heart care, we have created designated Provider Care Teams.  These Care Teams include your primary Cardiologist (physician) and Advanced Practice Providers (APPs -  Physician Assistants and Nurse Practitioners) who all work together to provide you with the care you need, when you need it. You will need a follow up appointment in 3 months.  Please call our office 2 months in advance to schedule this appointment.  You may see Thurmon Fair, MD or one of the following Advanced Practice Providers on your designated Care Team: [place], [place] . Micah Flesher, PA-C  Any Other Special Instructions Will Be Listed Below (If Applicable). You have been referred to [facility] for a Primary Care Physician. The phone number is [phone].     Signed, Corine Shelter, PA-C  06/02/2019 9:21 AM    [facility]

## 2019-06-02 NOTE — Assessment & Plan Note (Addendum)
Asymptomatic-beta blocker stopped

## 2019-06-02 NOTE — Assessment & Plan Note (Signed)
Eliquis added July 2020

## 2019-06-02 NOTE — Assessment & Plan Note (Signed)
During admission July 2020 for urosepsis- converted to NSR/ [facility]

## 2019-06-02 NOTE — Assessment & Plan Note (Signed)
Mild by PFTs Oct 2019

## 2019-06-30 ENCOUNTER — Ambulatory Visit: Payer: Medicare Other | Admitting: Cardiovascular Disease

## 2019-07-10 ENCOUNTER — Telehealth: Payer: Self-pay

## 2019-07-10 NOTE — Telephone Encounter (Signed)
   [facility] Pre-operative Risk Assessment    Request for surgical clearance:  1. What type of surgery is being performed? LEFT INGUINAL HERNIA REPAIR    2. When is this surgery scheduled? TBD   3. What type of clearance is required (medical clearance vs. Pharmacy clearance to hold med vs. Both)? BOTH  4. Are there any medications that need to be held prior to surgery and how long? ELIQUIS   5. Practice name and name of physician performing surgery? [facility]   6. What is your office phone number [phone]    7.   What is your office fax number [phone]  8.   Anesthesia type (None, local, MAC, general) ? CHOICE   Waylan Rocher 07/10/2019, 5:05 PM  _________________________________________________________________   (provider comments below)

## 2019-07-14 NOTE — Telephone Encounter (Signed)
Spoke with surgery scheduler and informed her that I faxed over the Eliquis recommendations and if she does not receive them in the next 24-48 hours for her to contact the office and we will refax it. Gave her Dr Croitoru's recommendations and she voiced understanding.   Spoke with patient and gave him Dr [employer] recommendations. She told me to hold on so he could grab his neighbor. Explained to her Dr Croitoru's recommendations and she voiced understanding. And thanked me for calling.

## 2019-07-14 NOTE — Telephone Encounter (Signed)
Please comment on eliquis. 

## 2019-07-14 NOTE — Telephone Encounter (Signed)
Patient with diagnosis of afib on Eliquis for anticoagulation.    Procedure: LEFT INGUINAL HERNIA REPAIR  Date of procedure: TBD  CHADS2-VASc score of  5 (HTN, AGE, stroke/tia x 2, CAD)  CrCl 74ml/min  Patient is high risk off anticoagulation due to history of stroke. Will route to MD for input.

## 2019-07-14 NOTE — Telephone Encounter (Signed)
OK to hold eliquis for 48 hours before surgery. Restart as soon as safe postop

## 2019-07-15 NOTE — Telephone Encounter (Signed)
   Primary Cardiologist: Sanda Klein, MD  Chart reviewed as part of pre-operative protocol coverage. Patient was contacted 07/15/2019 in reference to pre-operative risk assessment for pending surgery as outlined below.  [place] was last seen on 06/01/2019 by Kerin Ransom PA-C.  Since that day, Oshen Bohle has done well without significant chest pain or shortness of breath.  Therefore, based on ACC/AHA guidelines, the patient would be at acceptable risk for the planned procedure without further cardiovascular testing.   I will route this recommendation to the requesting party via Epic fax function and remove from pre-op pool.  Please call with questions. He will need to hold eliquis for 48 hours prior to the surgery and restart as soon as possible after the procedure.   [place], [place] 07/15/2019, 5:38 PM

## 2019-07-20 ENCOUNTER — Other Ambulatory Visit: Payer: Self-pay

## 2019-07-20 ENCOUNTER — Emergency Department (HOSPITAL_COMMUNITY)
Admission: EM | Admit: 2019-07-20 | Discharge: 2019-07-20 | Disposition: A | Discharge status: Home / Self Care | Payer: Medicare Other | Attending: Emergency Medicine | Admitting: Emergency Medicine

## 2019-07-20 ENCOUNTER — Encounter (HOSPITAL_COMMUNITY): Payer: Self-pay

## 2019-07-20 DIAGNOSIS — I252 Old myocardial infarction: Secondary | ICD-10-CM | POA: Diagnosis not present

## 2019-07-20 DIAGNOSIS — Z951 Presence of aortocoronary bypass graft: Secondary | ICD-10-CM | POA: Insufficient documentation

## 2019-07-20 DIAGNOSIS — Z7901 Long term (current) use of anticoagulants: Secondary | ICD-10-CM | POA: Diagnosis not present

## 2019-07-20 DIAGNOSIS — Z79899 Other long term (current) drug therapy: Secondary | ICD-10-CM | POA: Insufficient documentation

## 2019-07-20 DIAGNOSIS — R339 Retention of urine, unspecified: Secondary | ICD-10-CM | POA: Diagnosis present

## 2019-07-20 DIAGNOSIS — Z8673 Personal history of transient ischemic attack (TIA), and cerebral infarction without residual deficits: Secondary | ICD-10-CM | POA: Insufficient documentation

## 2019-07-20 DIAGNOSIS — F17210 Nicotine dependence, cigarettes, uncomplicated: Secondary | ICD-10-CM | POA: Diagnosis not present

## 2019-07-20 DIAGNOSIS — J449 Chronic obstructive pulmonary disease, unspecified: Secondary | ICD-10-CM | POA: Diagnosis not present

## 2019-07-20 DIAGNOSIS — I10 Essential (primary) hypertension: Secondary | ICD-10-CM | POA: Diagnosis not present

## 2019-07-20 NOTE — ED Provider Notes (Signed)
[facility] DEPT Provider Note   CSN: [id number] Arrival date & time: 07/20/19  8469     History   Chief Complaint Chief Complaint  Patient presents with  . Urinary Retention    HPI Joe Durham is a 73 y.o. male.     The history is provided by the patient.  Male GU Problem Presenting symptoms: no dysuria   Presenting symptoms comment:  Suprapubic catheter is clogged. Urinary retention for the last 10 hours Context: spontaneously   Relieved by:  Nothing Worsened by:  Activity Associated symptoms: abdominal pain   Associated symptoms: no diarrhea, no fever, no flank pain, no hematuria, no nausea, no scrotal swelling, no urinary frequency, no urinary hesitation, no urinary incontinence, no urinary retention and no vomiting   Risk factors: kidney stones     Past Medical History:  Diagnosis Date  . Bladder calculi   . COPD (chronic obstructive pulmonary disease) ([place])   . DOE (dyspnea on exertion)   . Elevated blood pressure reading    12-22-2018 reprots he hasnt had his BP meds since this past thursday   . Foley catheter in place   . History of bladder stone   . History of embolic stroke 62/9528   right PCA emobolism ischemic infarct (cyptogenic)  s/p LOOP recorder 01-05-2015;  07-08-2018 per pt no residual  . History of urinary retention   . Hypertension   . Mobitz type 1 second degree atrioventricular block   . Osteoarthritis   . Paroxysmal atrial tachycardia ([place])   . Peripheral vascular disease ([place])    last duplex 09-25-2016 bilateral ABI 0.6 and occulsion of  right popiteal posterior tibials and left peroneal  . Wears glasses     Patient Active Problem List   Diagnosis Date Noted  . Chronic anticoagulation 06/02/2019  . COPD (chronic obstructive pulmonary disease) ([place]) 06/01/2019  . B12 deficiency 05/04/2019  . Left inguinal hernia 05/04/2019  . Cough   . Anemia   . Bacteremia due to Gram-negative bacteria 05/02/2019  .  Sepsis ([place]) 05/01/2019  . New onset a-fib ([place]) 05/01/2019  . Pre-operative clearance 12/15/2018  . 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 NSTEMI (non-ST elevated myocardial infarction) ([place]) 09/03/2018  . Malnutrition of moderate degree 09/03/2018  . History of loop recorder 01/23/2018  . Mobitz type 1 second degree AV block 07/30/2017  . PAD (peripheral artery disease) ([place]) 07/30/2017  . Bladder calculi 07/15/2017  . UTI (urinary tract infection) 06/13/2017  . Acute cystitis with hematuria   . Gross hematuria 06/12/2017  . Renal insufficiency 06/12/2017  . Complicated UTI (urinary tract infection) 06/12/2017  . Acute pyelonephritis 06/12/2017  . Uncomplicated alcohol dependence ([place])   . AKI (acute kidney injury) ([place])   . Claudication ([place]) 11/14/2016  . Encounter for loop recorder check 07/19/2015  . First degree AV block 07/19/2015  . PAT (paroxysmal atrial tachycardia) ([place]) 07/19/2015  . Essential hypertension 04/05/2015  . Cocaine abuse ([place]) 04/05/2015  . Hospital discharge follow-up 01/10/2015  . Cerebral infarction due to embolism of right posterior cerebral artery ([place])   . Lung mass   . Acute embolic stroke ([place]) 41/32/4401  . Cerebral infarction due to unspecified mechanism   . Visual changes   . Substance abuse ([place])   . Dyslipidemia, goal LDL below 70   . History of stroke 01/03/2015  . Tobacco abuse 04/12/2014  . Bladder stones 04/12/2014    Past Surgical History:  Procedure Laterality Date  .  CORONARY ARTERY BYPASS GRAFT N/A 09/05/2018   Procedure: CORONARY ARTERY BYPASS GRAFTING (CABG) times 3 using left internal mammary artery and right greater saphenous vein harvested endoscopically.;  Surgeon: Kerin PernaVan Trigt, Peter, MD;  [facility];  Service: Open Heart Surgery;  Laterality: N/A;  . CYSTOSCOPY WITH LITHOLAPAXY N/A 07/15/2017   Procedure: OPEN CYSTOLITHOTOMY;  Surgeon: Crista ElliotBell, Eugene D III, MD;  Location: [facility] ORS;  Service: Urology;  Laterality: N/A;  .  INGUINAL HERNIA REPAIR Right 2011  . INSERTION OF SUPRAPUBIC CATHETER N/A 07/15/2017   Procedure: INSERTION OF SUPRAPUBIC CATHETER;  Surgeon: Crista ElliotBell, Eugene D III, MD;  Location: [facility] ORS;  Service: Urology;  Laterality: N/A;  . LEFT HEART CATH AND CORONARY ANGIOGRAPHY N/A 09/03/2018   Procedure: LEFT HEART CATH AND CORONARY ANGIOGRAPHY;  Surgeon: Iran OuchArida, Muhammad A, MD;  Location: [facility] INVASIVE CV LAB;  Service: Cardiovascular;  Laterality: N/A;  . LOOP RECORDER IMPLANT N/A 01/05/2015   Procedure: LOOP RECORDER IMPLANT;  Surgeon: Thurmon FairMihai Croitoru, MD;  Location: [facility] CATH LAB;  Service: Cardiovascular;  Laterality: N/A;  . ORIF WRIST FRACTURE Left 03/20/2014   Procedure: OPEN REDUCTION INTERNAL FIXATION (ORIF) WRIST FRACTURE;  Surgeon: Dominica SeverinWilliam Gramig, MD;  Location: [facility] OR;  Service: Orthopedics;  Laterality: Left;  . TEE WITHOUT CARDIOVERSION N/A 01/05/2015   Procedure: TRANSESOPHAGEAL ECHOCARDIOGRAM (TEE)/LOOP;  Surgeon: Thurmon FairMihai Croitoru, MD;  Location: [facility] ENDOSCOPY;  Service: Cardiovascular;  Laterality: N/A; ef 60-65%, no LA or RA thrombus, trivial PR, mild TR  . TEE WITHOUT CARDIOVERSION N/A 09/05/2018   Procedure: TRANSESOPHAGEAL ECHOCARDIOGRAM (TEE);  Surgeon: Donata ClayVan Trigt, Theron AristaPeter, MD;  [facility];  Service: Open Heart Surgery;  Laterality: N/A;  . TONSILLECTOMY  child  . TOTAL HIP ARTHROPLASTY Right 2000        Home Medications    Prior to Admission medications   Medication Sig Start Date End Date Taking? Authorizing Provider  apixaban (ELIQUIS) 5 MG TABS tablet Take 1 tablet (5 mg total) by mouth 2 (two) times daily. 05/06/19   Regalado, Belkys A, MD  atorvastatin (LIPITOR) 80 MG tablet Take 1 tablet (80 mg total) by mouth daily. 12/15/18   Abelino DerrickKilroy, Luke K, PA-C  benzonatate (TESSALON) 200 MG capsule Take 1 capsule (200 mg total) by mouth 3 (three) times daily. 05/06/19   Regalado, Belkys A, MD  cephALEXin (KEFLEX) 500 MG capsule Take 1 capsule (500 mg total) by mouth 4 (four) times daily. 05/24/19    Jacalyn LefevreHaviland, Julie, MD  lisinopril (ZESTRIL) 2.5 MG tablet Take 1 tablet (2.5 mg total) by mouth daily. 12/15/18   Kilroy, Eda PaschalLuke K, PA-C  mometasone-formoterol (DULERA) 100-5 MCG/ACT AERO Inhale 2 puffs into the lungs 2 (two) times daily. 05/06/19   Regalado, Belkys A, MD  pantoprazole (PROTONIX) 40 MG tablet Take 1 tablet (40 mg total) by mouth daily at 6 (six) AM. 05/07/19   Regalado, Belkys A, MD  umeclidinium bromide (INCRUSE ELLIPTA) 62.5 MCG/INH AEPB Inhale 1 puff into the lungs daily. 05/07/19   Regalado, Belkys A, MD  vitamin B-12 (CYANOCOBALAMIN) 100 MCG tablet Take 1 tablet (100 mcg total) by mouth daily. 05/06/19 05/05/20  Regalado, Prentiss BellsBelkys A, MD    Family History Family History  Problem Relation Age of Onset  . Stroke Mother   . Stroke Sister     Social History Social History   Tobacco Use  . Smoking status: Current Every Day Smoker    Years: 52.00    Types: Cigarettes  . Smokeless tobacco: Never Used  . Tobacco comment: 07-08-2018 pt  is down to 1ppw from 1 ppd, 12-22-2018 about  half a pack per day   Substance Use Topics  . Alcohol use: Yes    Alcohol/week: 0.0 standard drinks    Comment: 08/2018 ! drink 1 pint a day 3 times a week on average  . Drug use: No    Comment: history of cocaine ; last use 7 months ago      Allergies   Lactose intolerance (gi)   Review of Systems Review of Systems  Constitutional: Negative for chills and fever.  HENT: Negative for ear pain and sore throat.   Eyes: Negative for pain and visual disturbance.  Respiratory: Negative for cough and shortness of breath.   Cardiovascular: Negative for chest pain and palpitations.  Gastrointestinal: Positive for abdominal pain. Negative for diarrhea, nausea and vomiting.  Genitourinary: Negative for bladder incontinence, dysuria, flank pain, frequency, hematuria, hesitancy and scrotal swelling.  Musculoskeletal: Negative for arthralgias and back pain.  Skin: Negative for color change and rash.   Neurological: Negative for seizures and syncope.  All other systems reviewed and are negative.    Physical Exam Updated Vital Signs  ED Triage Vitals  Enc Vitals Group     BP 07/20/19 0657 140/72     Pulse Rate 07/20/19 0655 68     Resp 07/20/19 0655 16     Temp 07/20/19 0655 97.9 F (36.6 C)     Temp Source 07/20/19 0655 Oral     SpO2 07/20/19 0655 99 %     Weight 07/20/19 0650 130 lb (59 kg)     Height 07/20/19 0650 5\' 7"  (1.702 m)     Head Circumference --      Peak Flow --      Pain Score 07/20/19 0638 8     Pain Loc --      Pain Edu? --      Excl. in GC? --     Physical Exam Vitals signs and nursing note reviewed.  Constitutional:      Appearance: He is well-developed.  HENT:     Head: Normocephalic and atraumatic.  Eyes:     Conjunctiva/sclera: Conjunctivae normal.  Neck:     Musculoskeletal: Neck supple.  Cardiovascular:     Rate and Rhythm: Normal rate and regular rhythm.     Heart sounds: No murmur.  Pulmonary:     Effort: Pulmonary effort is normal. No respiratory distress.     Breath sounds: Normal breath sounds.  Abdominal:     General: Abdomen is flat. There is no distension.     Palpations: Abdomen is soft.     Tenderness: There is no abdominal tenderness.     Comments: Suprapubic catheter in place with no surrounding infection  Skin:    General: Skin is warm and dry.  Neurological:     Mental Status: He is alert.      ED Treatments / Results  Labs (all labs ordered are listed, but only abnormal results are displayed) Labs Reviewed - No data to display  EKG None  Radiology No results found.  Procedures Procedures (including critical care time)  Medications Ordered in ED Medications - No data to display   Initial Impression / Assessment and Plan / ED Course  I have reviewed the triage vital signs and the nursing notes.  Pertinent labs & imaging results that were available during my care of the patient were reviewed by me and  considered in my medical decision making (see chart for  details).     Joe Durham is a 73 year old male with history of COPD, kidney stones status post suprapubic catheter who presents to the ED with clogged catheter.  Patient with normal vitals.  No fever.  Patient states about 10 hours ago he noticed decreased output from his urinary catheter.  Suprapubic catheter was switched out with a new catheter and patient had about 500 cc of urine output.  Appeared to be clogged with sediment.  Patient with improvement of abdominal pain.  Was observed in the ED without any issues from catheter.  Discharged from the ED in good condition.  Understands return precautions.  This chart was dictated using voice recognition software.  Despite best efforts to proofread,  errors can occur which can change the documentation meaning.    Final Clinical Impressions(s) / ED Diagnoses   Final diagnoses:  Urinary retention    ED Discharge Orders    None       Virgina NorfolkCuratolo, Cinque Begley, DO 07/20/19 [id number]

## 2019-07-20 NOTE — ED Triage Notes (Signed)
Pt BIB [facility] from home. Pt has suprapubic catheter, noticed this morning while changing leg bag that he had decreased output. Bladder scan revealed 523mL in his bladder.

## 2019-07-20 NOTE — ED Notes (Signed)
At [facility]

## 2019-07-20 NOTE — ED Notes (Signed)
Attempted to irrigate catheter. Unsuccessful irrigation. Terri, RN replaced catheter.

## 2019-07-28 ENCOUNTER — Ambulatory Visit (INDEPENDENT_AMBULATORY_CARE_PROVIDER_SITE_OTHER): Payer: Medicare Other | Admitting: Cardiovascular Disease

## 2019-07-28 ENCOUNTER — Other Ambulatory Visit: Payer: Self-pay

## 2019-07-28 ENCOUNTER — Encounter: Payer: Self-pay | Admitting: Cardiovascular Disease

## 2019-07-28 DIAGNOSIS — I739 Peripheral vascular disease, unspecified: Secondary | ICD-10-CM | POA: Diagnosis not present

## 2019-07-28 NOTE — Patient Instructions (Addendum)
Medication Instructions:  Your physician recommends that you continue on your current medications as directed. Please refer to the Current Medication list given to you today.  If you need a refill on your cardiac medications before your next appointment, please call your pharmacy.   Lab work: NONE If you have labs (blood work) drawn today and your tests are completely normal, you will receive your results only by: Marland Kitchen MyChart Message (if you have MyChart) OR . A paper copy in the mail If you have any lab test that is abnormal or we need to change your treatment, we will call you to review the results.  Testing/Procedures: Your physician has requested that you have a lower or upper extremity arterial duplex. This test is an ultrasound of the arteries in the legs or arms. It looks at arterial blood flow in the legs and arms. Allow one hour for Lower and Upper Arterial scans. There are no restrictions or special instructions   Your physician has requested that you have an ankle brachial index (ABI). During this test an ultrasound and blood pressure cuff are used to evaluate the arteries that supply the arms and legs with blood. Allow thirty minutes for this exam. There are no restrictions or special instructions.   Follow-Up: At [facility], you and your health needs are our priority.  As part of our continuing mission to provide you with exceptional heart care, we have created designated Provider Care Teams.  These Care Teams include your primary Cardiologist (physician) and Advanced Practice Providers (APPs -  Physician Assistants and Nurse Practitioners) who all work together to provide you with the care you need, when you need it. . You MAY SCHEDULE a follow up appointment with Dr. Quay Burow AS NEEDED. You may see Dr. Gwenlyn Found or one of the following Advanced Practice Providers on your designated Care Team:   . Kerin Ransom, PA-C . Daleen Snook Kroeger, PA-C . Sande Rives,  PA-C ____________________ . Almyra Deforest, PA-C . Fabian Sharp, PA-C . Jory Sims, DNP . Rosaria Ferries, PA-C   ADDITIONAL INFORMATION:  please schedule a follow up appointment with Dr. Sallyanne Kuster.

## 2019-07-28 NOTE — Addendum Note (Signed)
Addended by: Annita Brod on: 07/28/2019 03:47 PM   Modules accepted: Orders

## 2019-07-28 NOTE — Assessment & Plan Note (Signed)
History of PAD with Dopplers performed 09/25/2016 revealing ABIs in the 0.6 range bilaterally with an occluded right popliteal artery posterior tibial on the right and occluded posterior tibial and peroneal on the left.  He does complain of bilateral calf claudication especially when walking up a hill.  He continues to smoke a half a pack a day.  Will obtain lower extremity arterial Doppler studies.

## 2019-07-28 NOTE — Progress Notes (Signed)
07/28/2019 Joe Durham   [birth year]  [id number]  Primary Physician Patient, No Pcp Per Primary Cardiologist: Runell Gess MD Nicholes Calamity, [place]  HPI:  Joe Durham is a 73 y.o.  thin appearing single African-American male father of 4, grandfather and 6 grandchildren referred to me by Dr. Royann Shivers for peripheral vascular evaluation.  I last saw him in the office 11/14/2016.  He has a history of treated hypertension, hyperlipidemia and tobacco abuse. He has smoked 50 pack years. He drinks 1/2 pint of alcohol a day. 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 He's never had a heart attack but has had a stroke in the past. He is complaining of left calf claudication. His recent Dopplers performed 09/25/16 revealed ABIs in the 0.6 range bilaterally with an occluded right popliteal and tibial vessel occlusion on the left.  Since I saw him over a year and a half ago he has undergone CABG times 07 August 2018 for three-vessel disease which she has recuperated from.  He also recently was admitted with urosepsis and was found to be in A. fib with RVR.  He complains of bilateral calf claudication especially when walking up hills.   Current Meds  Medication Sig  . apixaban (ELIQUIS) 5 MG TABS tablet Take 1 tablet (5 mg total) by mouth 2 (two) times daily.  Marland Kitchen atorvastatin (LIPITOR) 80 MG tablet Take 1 tablet (80 mg total) by mouth daily.  . benzonatate (TESSALON) 200 MG capsule Take 1 capsule (200 mg total) by mouth 3 (three) times daily.  . cephALEXin (KEFLEX) 500 MG capsule Take 1 capsule (500 mg total) by mouth 4 (four) times daily.  Marland Kitchen lisinopril (ZESTRIL) 2.5 MG tablet Take 1 tablet (2.5 mg total) by mouth daily.  . mometasone-formoterol (DULERA) 100-5 MCG/ACT AERO Inhale 2 puffs into the lungs 2 (two) times daily.  . pantoprazole (PROTONIX) 40 MG tablet Take 1 tablet (40 mg total) by mouth daily at 6 (six) AM.  . umeclidinium bromide (INCRUSE ELLIPTA) 62.5  MCG/INH AEPB Inhale 1 puff into the lungs daily.  . vitamin B-12 (CYANOCOBALAMIN) 100 MCG tablet Take 1 tablet (100 mcg total) by mouth daily.     Allergies  Allergen Reactions  . Lactose Intolerance (Gi) Diarrhea    Social History   Socioeconomic History  . Marital status: Single    Spouse name: Not on file  . Number of children: 4  . Years of education: 9  . Highest education level: Not on file  Occupational History  . Occupation: retired    Comment: Training and development officer  . Financial resource strain: Not on file  . Food insecurity    Worry: Not on file    Inability: Not on file  . Transportation needs    Medical: Not on file    Non-medical: Not on file  Tobacco Use  . Smoking status: Current Every Day Smoker    Years: 52.00    Types: Cigarettes  . Smokeless tobacco: Never Used  . Tobacco comment: 07-08-2018 pt is down to 1ppw from 1 ppd, 12-22-2018 about  half a pack per day   Substance and Sexual Activity  . Alcohol use: Yes    Alcohol/week: 0.0 standard drinks    Comment: 08/2018 ! drink 1 pint a day 3 times a week on average  . Drug use: No    Comment: history of cocaine ; last use 7 months ago   . Sexual activity: Not on file  Lifestyle  . Physical activity    Days per week: Not on file    Minutes per session: Not on file  . Stress: Not on file  Relationships  . Social Herbalist on phone: Not on file    Gets together: Not on file    Attends religious service: Not on file    Active member of club or organization: Not on file    Attends meetings of clubs or organizations: Not on file    Relationship status: Not on file  . Intimate partner violence    Fear of current or ex partner: Not on file    Emotionally abused: Not on file    Physically abused: Not on file    Forced sexual activity: Not on file  Other Topics Concern  . Not on file  Social History Narrative   Single   Right handed   Caffeine use - sodas on weekends with alcohol      Review of Systems: General: negative for chills, fever, night sweats or weight changes.  Cardiovascular: negative for chest pain, dyspnea on exertion, edema, orthopnea, palpitations, paroxysmal nocturnal dyspnea or shortness of breath Dermatological: negative for rash Respiratory: negative for cough or wheezing Urologic: negative for hematuria Abdominal: negative for nausea, vomiting, diarrhea, bright red blood per rectum, melena, or hematemesis Neurologic: negative for visual changes, syncope, or dizziness All other systems reviewed and are otherwise negative except as noted above.    Blood pressure (!) 162/80, pulse 62, height 5' 7.5" (1.715 m), weight 118 lb 9.6 oz (53.8 kg), SpO2 97 %.  General appearance: alert and no distress Neck: no adenopathy, no carotid bruit, no JVD, supple, symmetrical, trachea midline and thyroid not enlarged, symmetric, no tenderness/mass/nodules Lungs: clear to auscultation bilaterally Heart: regular rate and rhythm, S1, S2 normal, no murmur, click, rub or gallop Extremities: extremities normal, atraumatic, no cyanosis or edema Pulses: 2+ and symmetric Skin: Skin color, texture, turgor normal. No rashes or lesions Neurologic: Alert and oriented X 3, normal strength and tone. Normal symmetric reflexes. Normal coordination and gait  EKG not performed today  ASSESSMENT AND PLAN:   PAD (peripheral artery disease) ([place]) History of PAD with Dopplers performed 09/25/2016 revealing ABIs in the 0.6 range bilaterally with an occluded right popliteal artery posterior tibial on the right and occluded posterior tibial and peroneal on the left.  He does complain of bilateral calf claudication especially when walking up a hill.  He continues to smoke a half a pack a day.  Will obtain lower extremity arterial Doppler studies.      Lorretta Harp MD FACP,FACC,FAHA, [facility] 07/28/2019 3:36 PM

## 2019-08-01 ENCOUNTER — Other Ambulatory Visit: Payer: Self-pay

## 2019-08-01 ENCOUNTER — Encounter (HOSPITAL_COMMUNITY): Payer: Self-pay

## 2019-08-01 ENCOUNTER — Emergency Department (HOSPITAL_COMMUNITY)
Admission: EM | Admit: 2019-08-01 | Discharge: 2019-08-02 | Disposition: A | Discharge status: Home / Self Care | Payer: Medicare Other | Attending: Emergency Medicine | Admitting: Emergency Medicine

## 2019-08-01 DIAGNOSIS — F17210 Nicotine dependence, cigarettes, uncomplicated: Secondary | ICD-10-CM | POA: Insufficient documentation

## 2019-08-01 DIAGNOSIS — Z7901 Long term (current) use of anticoagulants: Secondary | ICD-10-CM | POA: Insufficient documentation

## 2019-08-01 DIAGNOSIS — J449 Chronic obstructive pulmonary disease, unspecified: Secondary | ICD-10-CM | POA: Diagnosis not present

## 2019-08-01 DIAGNOSIS — I10 Essential (primary) hypertension: Secondary | ICD-10-CM | POA: Diagnosis not present

## 2019-08-01 DIAGNOSIS — Y69 Unspecified misadventure during surgical and medical care: Secondary | ICD-10-CM | POA: Insufficient documentation

## 2019-08-01 DIAGNOSIS — Z79899 Other long term (current) drug therapy: Secondary | ICD-10-CM | POA: Insufficient documentation

## 2019-08-01 DIAGNOSIS — Z951 Presence of aortocoronary bypass graft: Secondary | ICD-10-CM | POA: Insufficient documentation

## 2019-08-01 DIAGNOSIS — T83090A Other mechanical complication of cystostomy catheter, initial encounter: Secondary | ICD-10-CM | POA: Diagnosis not present

## 2019-08-01 DIAGNOSIS — R339 Retention of urine, unspecified: Secondary | ICD-10-CM | POA: Diagnosis present

## 2019-08-01 NOTE — ED Triage Notes (Signed)
Pt BIB [facility] from home. Pt has a suprapubic catheter and has had no output today. Bladder scan is reading 270. Pt stated the was only able to get out a small amount of urine this morning. Pt was seen here on 9/14 for the same issue, where the catheter was changed out.

## 2019-08-01 NOTE — ED Provider Notes (Signed)
[facility] DEPT Provider Note   CSN: [id number] Arrival date & time: 08/01/19  2131     History   Chief Complaint Chief Complaint  Patient presents with  . Urinary Retention    HPI Joe Durham is a 72 y.o. male.     73 year old male with a history of COPD, CVA, hypertension, paroxysmal tachycardia presents to the emergency department due to decreased output from his suprapubic catheter.  States that he has had increased sediment in his bag as well as worsening lower abdominal burning pain and pressure.  Reports some leakage of urine from his penis.  Presented 2 weeks ago for similar complaints.  Had his suprapubic catheter changed at this time.  Bladder scan in triage read to 70.  The history is provided by the patient. No language interpreter was used.    Past Medical History:  Diagnosis Date  . Bladder calculi   . COPD (chronic obstructive pulmonary disease) ([place])   . DOE (dyspnea on exertion)   . Elevated blood pressure reading    12-22-2018 reprots he hasnt had his BP meds since this past thursday   . Foley catheter in place   . History of bladder stone   . History of embolic stroke 04/2375   right PCA emobolism ischemic infarct (cyptogenic)  s/p LOOP recorder 01-05-2015;  07-08-2018 per pt no residual  . History of urinary retention   . Hypertension   . Mobitz type 1 second degree atrioventricular block   . Osteoarthritis   . Paroxysmal atrial tachycardia ([place])   . Peripheral vascular disease ([place])    last duplex 09-25-2016 bilateral ABI 0.6 and occulsion of  right popiteal posterior tibials and left peroneal  . Wears glasses     Patient Active Problem List   Diagnosis Date Noted  . Chronic anticoagulation 06/02/2019  . COPD (chronic obstructive pulmonary disease) ([place]) 06/01/2019  . B12 deficiency 05/04/2019  . Left inguinal hernia 05/04/2019  . Cough   . Anemia   . Bacteremia due to Gram-negative bacteria 05/02/2019  .  Sepsis ([place]) 05/01/2019  . New onset a-fib ([place]) 05/01/2019  . Pre-operative clearance 12/15/2018  . 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 NSTEMI (non-ST elevated myocardial infarction) ([place]) 09/03/2018  . Malnutrition of moderate degree 09/03/2018  . History of loop recorder 01/23/2018  . Mobitz type 1 second degree AV block 07/30/2017  . PAD (peripheral artery disease) ([place]) 07/30/2017  . Bladder calculi 07/15/2017  . UTI (urinary tract infection) 06/13/2017  . Acute cystitis with hematuria   . Gross hematuria 06/12/2017  . Renal insufficiency 06/12/2017  . Complicated UTI (urinary tract infection) 06/12/2017  . Acute pyelonephritis 06/12/2017  . Uncomplicated alcohol dependence ([place])   . AKI (acute kidney injury) ([place])   . Claudication ([place]) 11/14/2016  . Encounter for loop recorder check 07/19/2015  . First degree AV block 07/19/2015  . PAT (paroxysmal atrial tachycardia) ([place]) 07/19/2015  . Essential hypertension 04/05/2015  . Cocaine abuse ([place]) 04/05/2015  . Hospital discharge follow-up 01/10/2015  . Cerebral infarction due to embolism of right posterior cerebral artery ([place])   . Lung mass   . Acute embolic stroke ([place]) 28/31/5176  . Cerebral infarction due to unspecified mechanism   . Visual changes   . Substance abuse ([place])   . Dyslipidemia, goal LDL below 70   . History of stroke 01/03/2015  . Tobacco abuse 04/12/2014  . Bladder stones 04/12/2014    Past Surgical History:  Procedure  Laterality Date  . CORONARY ARTERY BYPASS GRAFT N/A 09/05/2018   Procedure: CORONARY ARTERY BYPASS GRAFTING (CABG) times 3 using left internal mammary artery and right greater saphenous vein harvested endoscopically.;  Surgeon: Kerin Perna, MD;  Location: [facility] OR;  Service: Open Heart Surgery;  Laterality: N/A;  . CYSTOSCOPY WITH LITHOLAPAXY N/A 07/15/2017   Procedure: OPEN CYSTOLITHOTOMY;  Surgeon: Crista Elliot, MD;  Location: [facility] ORS;  Service: Urology;  Laterality: N/A;  .  INGUINAL HERNIA REPAIR Right 2011  . INSERTION OF SUPRAPUBIC CATHETER N/A 07/15/2017   Procedure: INSERTION OF SUPRAPUBIC CATHETER;  Surgeon: Crista Elliot, MD;  Location: [facility] ORS;  Service: Urology;  Laterality: N/A;  . LEFT HEART CATH AND CORONARY ANGIOGRAPHY N/A 09/03/2018   Procedure: LEFT HEART CATH AND CORONARY ANGIOGRAPHY;  Surgeon: Iran Ouch, MD;  Location: [facility] INVASIVE CV LAB;  Service: Cardiovascular;  Laterality: N/A;  . LOOP RECORDER IMPLANT N/A 01/05/2015   Procedure: LOOP RECORDER IMPLANT;  Surgeon: Thurmon Fair, MD;  Location: [facility] CATH LAB;  Service: Cardiovascular;  Laterality: N/A;  . ORIF WRIST FRACTURE Left 03/20/2014   Procedure: OPEN REDUCTION INTERNAL FIXATION (ORIF) WRIST FRACTURE;  Surgeon: Dominica Severin, MD;  Location: [facility] OR;  Service: Orthopedics;  Laterality: Left;  . TEE WITHOUT CARDIOVERSION N/A 01/05/2015   Procedure: TRANSESOPHAGEAL ECHOCARDIOGRAM (TEE)/LOOP;  Surgeon: Thurmon Fair, MD;  Location: [facility] ENDOSCOPY;  Service: Cardiovascular;  Laterality: N/A; ef 60-65%, no LA or RA thrombus, trivial PR, mild TR  . TEE WITHOUT CARDIOVERSION N/A 09/05/2018   Procedure: TRANSESOPHAGEAL ECHOCARDIOGRAM (TEE);  Surgeon: Donata Clay, Theron Arista, MD;  Location: [facility] OR;  Service: Open Heart Surgery;  Laterality: N/A;  . TONSILLECTOMY  child  . TOTAL HIP ARTHROPLASTY Right 2000        Home Medications    Prior to Admission medications   Medication Sig Start Date End Date Taking? Authorizing Provider  apixaban (ELIQUIS) 5 MG TABS tablet Take 1 tablet (5 mg total) by mouth 2 (two) times daily. 05/06/19   Regalado, Belkys A, MD  atorvastatin (LIPITOR) 80 MG tablet Take 1 tablet (80 mg total) by mouth daily. 12/15/18   Abelino Derrick, PA-C  benzonatate (TESSALON) 200 MG capsule Take 1 capsule (200 mg total) by mouth 3 (three) times daily. 05/06/19   Regalado, Belkys A, MD  cephALEXin (KEFLEX) 500 MG capsule Take 1 capsule (500 mg total) by mouth 4 (four) times daily. 05/24/19    Jacalyn Lefevre, MD  lisinopril (ZESTRIL) 2.5 MG tablet Take 1 tablet (2.5 mg total) by mouth daily. 12/15/18   Kilroy, Eda Paschal, PA-C  mometasone-formoterol (DULERA) 100-5 MCG/ACT AERO Inhale 2 puffs into the lungs 2 (two) times daily. 05/06/19   Regalado, Belkys A, MD  pantoprazole (PROTONIX) 40 MG tablet Take 1 tablet (40 mg total) by mouth daily at 6 (six) AM. 05/07/19   Regalado, Belkys A, MD  umeclidinium bromide (INCRUSE ELLIPTA) 62.5 MCG/INH AEPB Inhale 1 puff into the lungs daily. 05/07/19   Regalado, Belkys A, MD  vitamin B-12 (CYANOCOBALAMIN) 100 MCG tablet Take 1 tablet (100 mcg total) by mouth daily. 05/06/19 05/05/20  Regalado, Prentiss Bells, MD    Family History Family History  Problem Relation Age of Onset  . Stroke Mother   . Stroke Sister     Social History Social History   Tobacco Use  . Smoking status: Current Every Day Smoker    Years: 52.00    Types: Cigarettes  . Smokeless tobacco: Never Used  .  Tobacco comment: 07-08-2018 pt is down to 1ppw from 1 ppd, 12-22-2018 about  half a pack per day   Substance Use Topics  . Alcohol use: Yes    Alcohol/week: 0.0 standard drinks    Comment: 08/2018 ! drink 1 pint a day 3 times a week on average  . Drug use: No    Comment: history of cocaine ; last use 7 months ago      Allergies   Lactose intolerance (gi)   Review of Systems Review of Systems Ten systems reviewed and are negative for acute change, except as noted in the HPI.    Physical Exam Updated Vital Signs BP (!) 157/91   Pulse (!) 50   Temp 97.7 F (36.5 C)   Resp 18   Ht 5\' 7"  (1.702 m)   Wt 53 kg   SpO2 100%   BMI 18.30 kg/m   Physical Exam Vitals signs and nursing note reviewed.  Constitutional:      General: He is not in acute distress.    Appearance: He is well-developed. He is not diaphoretic.     Comments: Nontoxic appearing and in NAD  HENT:     Head: Normocephalic and atraumatic.  Eyes:     General: No scleral icterus.    Conjunctiva/sclera:  Conjunctivae normal.  Neck:     Musculoskeletal: Normal range of motion.  Pulmonary:     Effort: Pulmonary effort is normal. No respiratory distress.     Comments: Respirations even and unlabored Abdominal:    Genitourinary:    Comments: Uncircumcised penis with swelling of left hemiscrotum; chronic, per patient. Musculoskeletal: Normal range of motion.  Skin:    General: Skin is warm and dry.     Coloration: Skin is not pale.     Findings: No erythema or rash.  Neurological:     Mental Status: He is alert and oriented to person, place, and time.  Psychiatric:        Behavior: Behavior normal.      ED Treatments / Results  Labs (all labs ordered are listed, but only abnormal results are displayed) Labs Reviewed - No data to display  EKG None  Radiology No results found.  Procedures SUPRAPUBIC TUBE PLACEMENT  Date/Time: 08/01/2019 11:30 PM Performed by: Antony Madura, PA-C Authorized by: Antony Madura, PA-C   Consent:    Consent obtained:  Verbal   Consent given by:  Patient   Risks discussed:  Pain and bleeding   Alternatives discussed:  No treatment Anesthesia (see MAR for exact dosages):    Anesthesia method:  None Procedure details:    Complexity:  Simple   Catheter type:  Foley   Catheter size:  16 Fr   Ultrasound guidance: no     Number of attempts:  1   Urine characteristics:  Cloudy and yellow Post-procedure details:    Patient tolerance of procedure:  Tolerated well, no immediate complications Comments:     Suprapubic foley catheter replaced without complications. Patient tolerated procedure well.   (including critical care time)  Medications Ordered in ED Medications - No data to display   Initial Impression / Assessment and Plan / ED Course  I have reviewed the triage vital signs and the nursing notes.  Pertinent labs & imaging results that were available during my care of the patient were reviewed by me and considered in my medical  decision making (see chart for details).        73 year old male with a history  of suprapubic catheter presents for decreased output into his urine bag today.  Unable to irrigate Foley catheter.  Suprapubic catheter was subsequently replaced with a new 16 French Foley.  Patient had significant urine output.  Urine noted to be cloudy.  Was sent for culture.  Patient feeling much better at this time.  Stable for discharge and outpatient urology follow-up.   Final Clinical Impressions(s) / ED Diagnoses   Final diagnoses:  Blocked suprapubic catheter, initial encounter [facility](HCC)    ED Discharge Orders    None       Antony MaduraHumes, Chizara Mena, PA-C 08/02/19 0012    Charlynne PanderYao, David Hsienta, MD 08/02/19 1455

## 2019-08-01 NOTE — ED Notes (Signed)
Attempted to irrigate catheter, unsuccessful. EDP at bedside.

## 2019-08-03 ENCOUNTER — Ambulatory Visit: Payer: Self-pay | Admitting: Surgery

## 2019-08-03 ENCOUNTER — Other Ambulatory Visit: Payer: Self-pay | Admitting: Cardiovascular Disease

## 2019-08-03 DIAGNOSIS — I739 Peripheral vascular disease, unspecified: Secondary | ICD-10-CM

## 2019-08-04 LAB — URINE CULTURE: Culture: >=100000 — AB

## 2019-08-05 ENCOUNTER — Telehealth: Payer: Self-pay

## 2019-08-05 ENCOUNTER — Other Ambulatory Visit: Payer: Self-pay

## 2019-08-05 ENCOUNTER — Encounter (HOSPITAL_COMMUNITY): Payer: Medicare Other

## 2019-08-05 ENCOUNTER — Ambulatory Visit (HOSPITAL_COMMUNITY)
Admission: RE | Admit: 2019-08-05 | Discharge: 2019-08-05 | Disposition: A | Discharge status: Home / Self Care | Payer: Medicare Other | Source: Ambulatory Visit | Attending: Cardiovascular Disease | Admitting: Cardiovascular Disease

## 2019-08-05 DIAGNOSIS — I739 Peripheral vascular disease, unspecified: Secondary | ICD-10-CM | POA: Diagnosis present

## 2019-08-05 NOTE — Telephone Encounter (Signed)
No treatment for UC ED 08/02/2019 per Dr Faythe Dingwall Joselyn Arrow D

## 2019-08-05 NOTE — Progress Notes (Signed)
ED Antimicrobial Stewardship Positive Culture Follow Up   Joe Durham is an 73 y.o. male who presented to [facility] on 08/01/2019 with a chief complaint of urinary retention.  Chief Complaint  Patient presents with  . Urinary Retention    Recent Results (from the past 720 hour(s))  Urine culture     Status: Abnormal   Collection Time: 08/01/19 11:32 PM   Specimen: Urine, Random  Result Value Ref Range Status   Specimen Description   Final    URINE, RANDOM Performed at [facility] [address] [place], [place] [address]    Special Requests   Final    NONE Performed at [facility], [place] [address] [place], [place] [address]    Culture (A)  Final    >=100,000 COLONIES/mL KLEBSIELLA PNEUMONIAE >=100,000 COLONIES/mL MORGANELLA MORGANII    Report Status 08/04/2019 FINAL  Final   Organism ID, Bacteria KLEBSIELLA PNEUMONIAE (A)  Final   Organism ID, Bacteria MORGANELLA MORGANII (A)  Final      Susceptibility   Klebsiella pneumoniae - MIC*    AMPICILLIN >=32 RESISTANT Resistant     CEFAZOLIN <=4 SENSITIVE Sensitive     CEFTRIAXONE <=1 SENSITIVE Sensitive     CIPROFLOXACIN <=0.25 SENSITIVE Sensitive     GENTAMICIN <=1 SENSITIVE Sensitive     IMIPENEM <=0.25 SENSITIVE Sensitive     NITROFURANTOIN 32 SENSITIVE Sensitive     TRIMETH/SULFA <=20 SENSITIVE Sensitive     AMPICILLIN/SULBACTAM 8 SENSITIVE Sensitive     PIP/TAZO <=4 SENSITIVE Sensitive     Extended ESBL NEGATIVE Sensitive     * >=100,000 COLONIES/mL KLEBSIELLA PNEUMONIAE   Morganella morganii - MIC*    AMPICILLIN >=32 RESISTANT Resistant     CEFAZOLIN >=64 RESISTANT Resistant     CEFTRIAXONE <=1 SENSITIVE Sensitive     CIPROFLOXACIN <=0.25 SENSITIVE Sensitive     GENTAMICIN <=1 SENSITIVE Sensitive     IMIPENEM 4 SENSITIVE Sensitive     NITROFURANTOIN RESISTANT Resistant     TRIMETH/SULFA <=20 SENSITIVE Sensitive     AMPICILLIN/SULBACTAM >=32 RESISTANT Resistant    PIP/TAZO <=4 SENSITIVE Sensitive     * >=100,000 COLONIES/mL MORGANELLA MORGANII    Plan:  - suspects asymptomatic bacteruria. No treatment indicated at this time.  ED Provider: Dr. Everrett Coombe, Merleen Nicely 08/05/2019, 8:33 AM Clinical Pharmacist [phone]

## 2019-08-12 ENCOUNTER — Encounter (HOSPITAL_COMMUNITY): Payer: Self-pay

## 2019-08-12 NOTE — Patient Instructions (Signed)
DUE TO COVID-19 ONLY ONE VISITOR IS ALLOWED TO COME WITH YOU AND STAY IN THE WAITING ROOM ONLY DURING PRE OP AND PROCEDURE. THE ONE VISITOR MAY VISIT WITH YOU IN YOUR PRIVATE ROOM DURING VISITING HOURS ONLY!!   COVID SWAB TESTING MUST BE COMPLETED ON: Today immediately after pre op appointment.    [address], [place] [facility] enter pre surgical testing line (Must self quarantine after testing. Follow instructions on handout.)             Your procedure is scheduled on: Monday, Oct. 12, 2020   Report to [facility]  Entrance   Report to Short Stay at 5:30 AM   Call this number if you have problems the morning of surgery [phone]   Do not eat food or drink liquids :After Midnight.   Brush your teeth the morning of surgery.   Do NOT smoke after Midnight   Take these medicines the morning of surgery with A SIP OF WATER: Atorvastatin, Pantoprazole   Bring Asthma Inhaler day of surgery                               You may not have any metal on your body including jewelry, and body piercings             Do not wear lotions, powders, perfumes/cologne, or deodorant                          Men may shave face and neck.   Do not bring valuables to the hospital. [place].   Contacts, dentures or bridgework may not be worn into surgery.    Patients discharged the day of surgery will not be allowed to drive home.   Special Instructions: Bring a copy of your healthcare power of attorney and living will documents         the day of surgery if you haven't scanned them in before.              Please read over the following fact sheets you were given:  [facility] - Preparing for Surgery Before surgery, you can play an important role.  Because skin is not sterile, your skin needs to be as free of germs as possible.  You can reduce the number of germs on your skin by washing with CHG  (chlorahexidine gluconate) soap before surgery.  CHG is an antiseptic cleaner which kills germs and bonds with the skin to continue killing germs even after washing. Please DO NOT use if you have an allergy to CHG or antibacterial soaps.  If your skin becomes reddened/irritated stop using the CHG and inform your nurse when you arrive at Short Stay. Do not shave (including legs and underarms) for at least 48 hours prior to the first CHG shower.  You may shave your face/neck.  Please follow these instructions carefully:  1.  Shower with CHG Soap the night before surgery and the  morning of surgery.  2.  If you choose to wash your hair, wash your hair first as usual with your normal  shampoo.  3.  After you shampoo, rinse your hair and body thoroughly to remove the shampoo.  4.  Use CHG as you would any other liquid soap.  You can apply chg directly to the skin and wash.  Gently with a scrungie or clean washcloth.  5.  Apply the CHG Soap to your body ONLY FROM THE NECK DOWN.   Do   not use on face/ open                           Wound or open sores. Avoid contact with eyes, ears mouth and   genitals (private parts).                       Wash face,  Genitals (private parts) with your normal soap.             6.  Wash thoroughly, paying special attention to the area where your    surgery  will be performed.  7.  Thoroughly rinse your body with warm water from the neck down.  8.  DO NOT shower/wash with your normal soap after using and rinsing off the CHG Soap.                9.  Pat yourself dry with a clean towel.            10.  Wear clean pajamas.            11.  Place clean sheets on your bed the night of your first shower and do not  sleep with pets. Day of Surgery : Do not apply any lotions/deodorants the morning of surgery.  Please wear clean clothes to the hospital/surgery center.  FAILURE TO FOLLOW THESE INSTRUCTIONS MAY RESULT IN THE CANCELLATION OF YOUR  SURGERY  PATIENT SIGNATURE_________________________________  NURSE SIGNATURE__________________________________  ________________________________________________________________________

## 2019-08-13 ENCOUNTER — Encounter (HOSPITAL_COMMUNITY): Payer: Self-pay | Admitting: Anesthesiology

## 2019-08-13 ENCOUNTER — Encounter (HOSPITAL_COMMUNITY)
Admission: RE | Admit: 2019-08-13 | Discharge: 2019-08-13 | Disposition: A | Discharge status: Home / Self Care | Payer: Medicare Other | Source: Ambulatory Visit

## 2019-08-13 ENCOUNTER — Encounter (HOSPITAL_COMMUNITY): Payer: Self-pay | Admitting: Physician Assistant

## 2019-08-13 ENCOUNTER — Inpatient Hospital Stay (HOSPITAL_COMMUNITY): Admission: RE | Admit: 2019-08-13 | Payer: Medicare Other | Source: Ambulatory Visit

## 2019-08-13 HISTORY — DX: Unspecified atrial fibrillation: I48.91

## 2019-08-13 HISTORY — DX: Peripheral vascular disease, unspecified: I73.9

## 2019-08-13 HISTORY — DX: Other cystostomy status: Z93.59

## 2019-08-13 NOTE — Progress Notes (Signed)
Joe Durham did not arrive for pre op appointment 08/13/2019.  Called Joe Durham to do medical history by telephone, when I verified surgery time Joe Durham replied he would not have transportation at 5:30AM, I transferred the call to the nurse triage line at [facility].

## 2019-08-13 NOTE — Progress Notes (Signed)
Pt contacted regarding missed covid testing appointment. Pt states that he's been feeling under the weather for the last 2 days. Pt would not pinpoint symptoms but denied SOB or fever. Pt stated that he would like to reschedule for tomorrow. Pt advised to contact surgeons office regarding his new symptoms to make them aware prior to surgery on Monday. Pt verbalizes agreement.   Jacqlyn Larsen, RN

## 2019-08-14 ENCOUNTER — Inpatient Hospital Stay (HOSPITAL_COMMUNITY): Admission: RE | Admit: 2019-08-14 | Payer: Medicare Other | Source: Ambulatory Visit

## 2019-08-14 ENCOUNTER — Encounter (HOSPITAL_COMMUNITY): Payer: Self-pay | Admitting: Emergency Medicine

## 2019-08-14 ENCOUNTER — Other Ambulatory Visit: Payer: Self-pay

## 2019-08-14 ENCOUNTER — Emergency Department (HOSPITAL_COMMUNITY)
Admission: EM | Admit: 2019-08-14 | Discharge: 2019-08-14 | Disposition: A | Discharge status: Home / Self Care | Payer: Medicare Other | Attending: Emergency Medicine | Admitting: Emergency Medicine

## 2019-08-14 DIAGNOSIS — I4891 Unspecified atrial fibrillation: Secondary | ICD-10-CM | POA: Diagnosis not present

## 2019-08-14 DIAGNOSIS — Z7901 Long term (current) use of anticoagulants: Secondary | ICD-10-CM | POA: Diagnosis not present

## 2019-08-14 DIAGNOSIS — T83091A Other mechanical complication of indwelling urethral catheter, initial encounter: Secondary | ICD-10-CM | POA: Diagnosis present

## 2019-08-14 DIAGNOSIS — F17210 Nicotine dependence, cigarettes, uncomplicated: Secondary | ICD-10-CM | POA: Insufficient documentation

## 2019-08-14 DIAGNOSIS — Z79899 Other long term (current) drug therapy: Secondary | ICD-10-CM | POA: Diagnosis not present

## 2019-08-14 DIAGNOSIS — J449 Chronic obstructive pulmonary disease, unspecified: Secondary | ICD-10-CM | POA: Insufficient documentation

## 2019-08-14 DIAGNOSIS — I10 Essential (primary) hypertension: Secondary | ICD-10-CM | POA: Diagnosis not present

## 2019-08-14 DIAGNOSIS — Z951 Presence of aortocoronary bypass graft: Secondary | ICD-10-CM | POA: Insufficient documentation

## 2019-08-14 DIAGNOSIS — Y69 Unspecified misadventure during surgical and medical care: Secondary | ICD-10-CM | POA: Diagnosis not present

## 2019-08-14 DIAGNOSIS — N390 Urinary tract infection, site not specified: Secondary | ICD-10-CM

## 2019-08-14 LAB — URINALYSIS, ROUTINE W REFLEX MICROSCOPIC
Bilirubin Urine: NEGATIVE
Glucose, UA: NEGATIVE mg/dL
Ketones, ur: 5 mg/dL — AB
Nitrite: NEGATIVE
Protein, ur: >=300 mg/dL — AB
Specific Gravity, Urine: 1.014 (ref 1.005–1.030)
WBC, UA: >50 WBC/hpf — ABNORMAL HIGH (ref 0–5)
pH: 8 (ref 5.0–8.0)

## 2019-08-14 MED ORDER — LIDOCAINE HCL 1 % IJ SOLN
INTRAMUSCULAR | Status: AC
  Administered 2019-08-14: 14:00:00
  Filled 2019-08-14: qty 20

## 2019-08-14 MED ORDER — CEFTRIAXONE SODIUM 1 G IJ SOLR
1.0000 g | Freq: Once | INTRAMUSCULAR | Status: AC
  Administered 2019-08-14: 1 g via INTRAMUSCULAR
  Filled 2019-08-14: qty 10

## 2019-08-14 MED ORDER — CEPHALEXIN 500 MG PO CAPS: 500.0000 mg | ORAL_CAPSULE | Freq: Three times a day (TID) | ORAL | 0 refills | Status: DC

## 2019-08-14 NOTE — Anesthesia Preprocedure Evaluation (Deleted)
Anesthesia Evaluation    Reviewed: Allergy & Precautions, Patient's Chart, lab work & pertinent test results  Airway        Dental   Pulmonary COPD, Current Smoker and Patient abstained from smoking.,           Cardiovascular hypertension, + CAD, + Past MI, + CABG (09/2018) and + Peripheral Vascular Disease  + dysrhythmias Atrial Fibrillation   EKG: 06/01/2019 Rate 72 bpm Sinus rhythm with 2nd degree AV block (Mobitz I) Anteroseptal infarct, age undetermined Prolonged QT  Echo 05/02/2019 1. The left ventricle has normal systolic function, with an ejection fraction of 55-60%. The cavity size was normal. Left ventricular diastolic Doppler parameters are consistent with pseudonormalization. No evidence of left ventricular regional wall  motion abnormalities. 2. The right ventricle has normal systolic function. The cavity was normal. There is no increase in right ventricular wall thickness. 3. Left atrial size was mildly dilated. 4. Right atrial size was mildly dilated. 5. No evidence of mitral valve stenosis. Trivial mitral regurgitation. 6. The aortic valve is tricuspid. Mild calcification of the aortic valve. No stenosis of the aortic valve. 7. The aortic root is normal in size and structure. 8. Normal IVC size with PA systolic pressure 36 mmHg.   Neuro/Psych CVA (2016 s/p LOOP recorder), No Residual Symptoms negative psych ROS   GI/Hepatic negative GI ROS, Neg liver ROS,   Endo/Other  negative endocrine ROS  Renal/GU negative Renal ROS   Suprapubic catheter negative genitourinary   Musculoskeletal  (+) Arthritis ,   Abdominal   Peds  Hematology negative hematology ROS (+)   Anesthesia Other Findings In ED 10/9 for UTI  Reproductive/Obstetrics                           Anesthesia Physical Anesthesia Plan  ASA: III  Anesthesia Plan: General   Post-op Pain Management:     Induction: Intravenous  PONV Risk Score and Plan: 1 and Dexamethasone and Ondansetron  Airway Management Planned: Oral ETT  Additional Equipment:   Intra-op Plan:   Post-operative Plan: Extubation in OR  Informed Consent: I have reviewed the patients History and Physical, chart, labs and discussed the procedure including the risks, benefits and alternatives for the proposed anesthesia with the patient or authorized representative who has indicated his/her understanding and acceptance.     Dental advisory given  Plan Discussed with: CRNA  Anesthesia Plan Comments: (See PAT note 08/13/2019, Konrad Felix, PA-C)       Anesthesia Quick Evaluation

## 2019-08-14 NOTE — ED Provider Notes (Signed)
[facility]-EMERGENCY DEPT Provider Note   CSN: [id number] Arrival date & time: 08/14/19  1210     History   Chief Complaint Chief Complaint  Patient presents with  . Catheter Issue    HPI Joe Durham is a 73 y.o. male.     Patient is a 73 year old male with past medical history of paroxysmal A. fib, coronary artery disease with CABG.  Patient has an indwelling suprapubic catheter.  Patient presents today due to leakage around the catheter.  He states he is having no urine output into the bag and that the urine is coming out of the catheter site.  He denies any fevers or chills.  He describes suprapubic discomfort.  The history is provided by the patient.    Past Medical History:  Diagnosis Date  . Atrial fibrillation (HCC)   . Bladder calculi   . COPD (chronic obstructive pulmonary disease) (HCC)   . DOE (dyspnea on exertion)   . Elevated blood pressure reading    12-22-2018 reprots he hasnt had his BP meds since this past thursday   . Foley catheter in place   . History of bladder stone   . 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 History of urinary retention   . Hypertension   . Mobitz type 1 second degree atrioventricular block   . Osteoarthritis   . PAD (peripheral artery disease) (HCC)   . Paroxysmal atrial tachycardia (HCC)   . Peripheral vascular disease (HCC)    last duplex 09-25-2016 bilateral ABI 0.6 and occulsion of  right popiteal posterior tibials and left peroneal  . Presence of suprapubic catheter (HCC)   . Wears glasses     Patient Active Problem List   Diagnosis Date Noted  . Chronic anticoagulation 06/02/2019  . COPD (chronic obstructive pulmonary disease) (HCC) 06/01/2019  . B12 deficiency 05/04/2019  . Left inguinal hernia 05/04/2019  . Cough   . Anemia   . Bacteremia due to Gram-negative bacteria 05/02/2019   . Sepsis (HCC) 05/01/2019  . New onset a-fib (HCC) 05/01/2019  . Pre-operative clearance 12/15/2018  . 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 NSTEMI (non-ST elevated myocardial infarction) (HCC) 09/03/2018  . Malnutrition of moderate degree 09/03/2018  . History of loop recorder 01/23/2018  . Mobitz type 1 second degree AV block 07/30/2017  . PAD (peripheral artery disease) (HCC) 07/30/2017  . Bladder calculi 07/15/2017  . UTI (urinary tract infection) 06/13/2017  . Acute cystitis with hematuria   . Gross hematuria 06/12/2017  . Renal insufficiency 06/12/2017  . Complicated UTI (urinary tract infection) 06/12/2017  . Acute pyelonephritis 06/12/2017  . Uncomplicated alcohol dependence (HCC)   . AKI (acute kidney injury) (HCC)   . Claudication (HCC) 11/14/2016  . Encounter for loop recorder check 07/19/2015  . First degree AV block 07/19/2015  . PAT (paroxysmal atrial tachycardia) (HCC) 07/19/2015  . Essential hypertension 04/05/2015  . Cocaine abuse (HCC) 04/05/2015  . Hospital discharge follow-up 01/10/2015  . 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 Lung mass   . Acute embolic stroke (HCC) 01/04/2015  . Cerebral infarction due to unspecified mechanism   . Visual changes   . Substance abuse (HCC)   . Dyslipidemia, goal LDL below 70   . History of stroke 01/03/2015  . Tobacco abuse 04/12/2014  . Bladder stones 04/12/2014  Past Surgical History:  Procedure Laterality Date  . CORONARY ARTERY BYPASS GRAFT N/A 09/05/2018   Procedure: CORONARY ARTERY BYPASS GRAFTING (CABG) times 3 using left internal mammary artery and right greater saphenous vein harvested endoscopically.;  Surgeon: Ivin Poot, MD;  Location: [place];  Service: Open Heart Surgery;  Laterality: N/A;  . CYSTOSCOPY WITH LITHOLAPAXY N/A 07/15/2017   Procedure: OPEN CYSTOLITHOTOMY;  Surgeon: Lucas Mallow, MD;  [facility];  Service: Urology;  Laterality: N/A;  .  INGUINAL HERNIA REPAIR Right 2011  . INSERTION OF SUPRAPUBIC CATHETER N/A 07/15/2017   Procedure: INSERTION OF SUPRAPUBIC CATHETER;  Surgeon: Lucas Mallow, MD;  [facility];  Service: Urology;  Laterality: N/A;  . LEFT HEART CATH AND CORONARY ANGIOGRAPHY N/A 09/03/2018   Procedure: LEFT HEART CATH AND CORONARY ANGIOGRAPHY;  Surgeon: Wellington Hampshire, MD;  Location: [place] [facility];  Service: Cardiovascular;  Laterality: N/A;  . LOOP RECORDER IMPLANT N/A 01/05/2015   Procedure: LOOP RECORDER IMPLANT;  Surgeon: Sanda Klein, MD;  Location: [place] C[facility];  Service: Cardiovascular;  Laterality: N/A;  . ORIF WRIST FRACTURE Left 03/20/2014   Procedure: OPEN REDUCTION INTERNAL FIXATION (ORIF) WRIST FRACTURE;  Surgeon: Joe Kaufman, MD;  Location: [place];  Service: Orthopedics;  Laterality: Left;  . TEE WITHOUT CARDIOVERSION N/A 01/05/2015   Procedure: TRANSESOPHAGEAL ECHOCARDIOGRAM (TEE)/LOOP;  Surgeon: Sanda Klein, MD;  Location: [place] [facility];  Service: Cardiovascular;  Laterality: N/A; ef 60-65%, no LA or RA thrombus, trivial PR, mild TR  . TEE WITHOUT CARDIOVERSION N/A 09/05/2018   Procedure: TRANSESOPHAGEAL ECHOCARDIOGRAM (TEE);  Surgeon: Prescott Gum, Collier Salina, MD;  Location: [place];  Service: Open Heart Surgery;  Laterality: N/A;  . TONSILLECTOMY  child  . TOTAL HIP ARTHROPLASTY Right 2000        Home Medications    Prior to Admission medications   Medication Sig Start Date End Date Taking? Authorizing Provider  apixaban (ELIQUIS) 5 MG TABS tablet Take 1 tablet (5 mg total) by mouth 2 (two) times daily. 05/06/19   Regalado, Belkys A, MD  atorvastatin (LIPITOR) 80 MG tablet Take 1 tablet (80 mg total) by mouth daily. 12/15/18   Erlene Quan, PA-C  benzonatate (TESSALON) 200 MG capsule Take 1 capsule (200 mg total) by mouth 3 (three) times daily. Patient not taking: Reported on 08/01/2019 05/06/19   Regalado, Jerald Kief A, MD  cephALEXin (KEFLEX) 500 MG capsule Take 1 capsule (500 mg total) by  mouth 4 (four) times daily. Patient not taking: Reported on 08/01/2019 05/24/19   Isla Pence, MD  lisinopril (ZESTRIL) 2.5 MG tablet Take 1 tablet (2.5 mg total) by mouth daily. 12/15/18   Kilroy, Doreene Burke, PA-C  mometasone-formoterol (DULERA) 100-5 MCG/ACT AERO Inhale 2 puffs into the lungs 2 (two) times daily. Patient taking differently: Inhale 2 puffs into the lungs 2 (two) times daily as needed for wheezing.  05/06/19   Regalado, Belkys A, MD  pantoprazole (PROTONIX) 40 MG tablet Take 1 tablet (40 mg total) by mouth daily at 6 (six) AM. 05/07/19   Regalado, Belkys A, MD  umeclidinium bromide (INCRUSE ELLIPTA) 62.5 MCG/INH AEPB Inhale 1 puff into the lungs daily. Patient not taking: Reported on 08/01/2019 05/07/19   Regalado, Jerald Kief A, MD  vitamin B-12 (CYANOCOBALAMIN) 100 MCG tablet Take 1 tablet (100 mcg total) by mouth daily. 05/06/19 05/05/20  Regalado, Cassie Freer, MD    Family History Family History  Problem Relation Age of Onset  . Stroke Mother   .  Stroke Sister     Social History Social History   Tobacco Use  . Smoking status: Current Every Day Smoker    Years: 52.00    Types: Cigarettes  . Smokeless tobacco: Never Used  . Tobacco comment: 07-08-2018 pt is down to 1ppw from 1 ppd, 12-22-2018 about  half a pack per day   Substance Use Topics  . Alcohol use: Yes    Alcohol/week: 0.0 standard drinks    Comment: 08/2018 ! drink 1 pint a day 3 times a week on average  . Drug use: No    Comment: history of cocaine ; last use 7 months ago      Allergies   Lactose intolerance (gi)   Review of Systems Review of Systems  All other systems reviewed and are negative.    Physical Exam Updated Vital Signs BP (!) 157/121 (BP Location: Left Arm)   Pulse 83   Temp (!) 97.5 F (36.4 C) (Oral)   Resp 18   Ht 5\' 8"  (1.727 m)   Wt 53 kg   SpO2 100%   BMI 17.77 kg/m   Physical Exam Vitals signs and nursing note reviewed.  Constitutional:      General: He is not in acute  distress.    Appearance: He is well-developed. He is not diaphoretic.  HENT:     Head: Normocephalic and atraumatic.  Neck:     Musculoskeletal: Normal range of motion and neck supple.  Cardiovascular:     Rate and Rhythm: Normal rate and regular rhythm.     Heart sounds: No murmur. No friction rub.  Pulmonary:     Effort: Pulmonary effort is normal. No respiratory distress.     Breath sounds: Normal breath sounds. No wheezing or rales.  Abdominal:     General: Bowel sounds are normal. There is no distension.     Palpations: Abdomen is soft.     Tenderness: There is abdominal tenderness.     Comments: There is an indwelling suprapubic catheter present.  There is no urine in the leg bag.  There is no surrounding erythema or purulent drainage.  There is tenderness in the suprapubic region.  Musculoskeletal: Normal range of motion.  Skin:    General: Skin is warm and dry.  Neurological:     Mental Status: He is alert and oriented to person, place, and time.     Coordination: Coordination normal.      ED Treatments / Results  Labs (all labs ordered are listed, but only abnormal results are displayed) Labs Reviewed - No data to display  EKG None  Radiology No results found.  Procedures BLADDER CATHETERIZATION  Date/Time: 08/14/2019 1:10 PM Performed by: Geoffery Lyons, MD Authorized by: Geoffery Lyons, MD   Consent:    Consent obtained:  Verbal   Consent given by:  Patient   Risks discussed:  False passage, infection and pain   Alternatives discussed:  No treatment Pre-procedure details:    Procedure purpose:  Therapeutic Anesthesia (see MAR for exact dosages):    Anesthesia method:  None Procedure details:    Catheter insertion:  Indwelling   Catheter type:  Foley   Catheter size:  16 Fr   Bladder irrigation: no     Number of attempts:  1   Urine characteristics:  Foul smelling, yellow and cloudy Post-procedure details:    Patient tolerance of procedure:   Tolerated well, no immediate complications Comments:     The existing suprapubic catheter appears to  be clogged.  This was removed and urine began flowing through the catheter site.  A 16 French Foley was successfully placed.   (including critical care time)  Medications Ordered in ED Medications - No data to display   Initial Impression / Assessment and Plan / ED Course  I have reviewed the triage vital signs and the nursing notes.  Pertinent labs & imaging results that were available during my care of the patient were reviewed by me and considered in my medical decision making (see chart for details).  Patient with suprapubic catheter presenting with complaints of leakage around the catheter and through his penis.  Patient has had occluded catheters in the past and I suspect this to be the case today.  The catheter was removed and a significant quantity of urine leaked from the catheter site.  The catheter was replaced by myself.  Urinalysis consistent with urinary tract infection.  Patient's prior urine culture susceptible to Rocephin.  Patient will be given Rocephin, then discharged with Keflex.  He is to return as needed for any problems.  Final Clinical Impressions(s) / ED Diagnoses   Final diagnoses:  None    ED Discharge Orders    None       Geoffery Lyons, MD 08/14/19 1401

## 2019-08-14 NOTE — Discharge Instructions (Addendum)
Begin taking Keflex as prescribed.  Follow-up with your urologist in the next week if you experience additional problems.  Return to the emergency department if symptoms significantly worsen or change in the meantime.

## 2019-08-14 NOTE — Progress Notes (Signed)
Anesthesia Chart Review   Case: [id number] Date/Time: 08/17/19 0715   Procedure: OPEN LEFT INGUINAL HERNIA REPAIR WITH MESH (Left )   Anesthesia type: General   Pre-op diagnosis: LEFT INGUINAL HERNIA, INCARCERATED   [facility] 08 / [facility]   Surgeon: Armandina Gemma, MD      DISCUSSION:73 y.o. current every day smoker with h/o COPD, CAD (CABG 2019), PAF (on Eliquis), HTN, CVA 2016, PVD, Mobitz type 1, left incarcerated inguinal hernia scheduled for above procedure 08/17/2019 with Dr. Armandina Gemma.    Cleared by cardiology 07/15/2019.  Per Almyra Deforest, PA, "Patient was contacted 07/15/2019 in reference to pre-operative risk assessment for pending surgery as outlined below.  Joe Durham was last seen on 06/01/2019 by Kerin Ransom PA-C.  Since that day, Joe Durham has done well without significant chest pain or shortness of breath.  Therefore, based on ACC/AHA guidelines, the patient would be at acceptable risk for the planned procedure without further cardiovascular testing."  Advised to hold Eliquis 48 hours prior to procedure.   Pt no showed for PAT visit.  Anesthesia will evaluate DOS.  Discussed with Dr. Therisa Doyne.  VS: There were no vitals taken for this visit.  PROVIDERS: Patient, No Pcp Per  Croitoru, Mihai, MD is Cardiologist   Quay Burow, MD Cardiologist (sees for PVD) LABS: SDW (all labs ordered are listed, but only abnormal results are displayed)  Labs Reviewed - No data to display   IMAGES:   EKG: 06/01/2019 Rate 72 bpm Sinus rhythm with 2nd degree AV block (Mobitz I) Anteroseptal infarct, age undetermined Prolonged QT  CV: Echo 05/02/2019 IMPRESSIONS    1. The left ventricle has normal systolic function, with an ejection fraction of 55-60%. The cavity size was normal. Left ventricular diastolic Doppler parameters are consistent with pseudonormalization. No evidence of left ventricular regional wall  motion abnormalities.  2. The right ventricle  has normal systolic function. The cavity was normal. There is no increase in right ventricular wall thickness.  3. Left atrial size was mildly dilated.  4. Right atrial size was mildly dilated.  5. No evidence of mitral valve stenosis. Trivial mitral regurgitation.  6. The aortic valve is tricuspid. Mild calcification of the aortic valve. No stenosis of the aortic valve.  7. The aortic root is normal in size and structure.  8. Normal IVC size with PA systolic pressure 36 mmHg. Past Medical History:  Diagnosis Date  . Atrial fibrillation ([place])   . Bladder calculi   . COPD (chronic obstructive pulmonary disease) ([place])   . DOE (dyspnea on exertion)   . Elevated blood pressure reading    12-22-2018 reprots he hasnt had his BP meds since this past thursday   . Foley catheter in place   . History of bladder stone   . History of embolic stroke 56/4332   right PCA emobolism ischemic infarct (cyptogenic)  s/p LOOP recorder 01-05-2015;  07-08-2018 per pt no residual  . History of urinary retention   . Hypertension   . Mobitz type 1 second degree atrioventricular block   . Osteoarthritis   . PAD (peripheral artery disease) ([place])   . Paroxysmal atrial tachycardia ([place])   . Peripheral vascular disease ([place])    last duplex 09-25-2016 bilateral ABI 0.6 and occulsion of  right popiteal posterior tibials and left peroneal  . Presence of suprapubic catheter ([place])   . Wears glasses     Past Surgical History:  Procedure Laterality Date  . CORONARY ARTERY BYPASS  GRAFT N/A 09/05/2018   Procedure: CORONARY ARTERY BYPASS GRAFTING (CABG) times 3 using left internal mammary artery and right greater saphenous vein harvested endoscopically.;  Surgeon: Kerin Perna, MD;  Location: [facility];  Service: Open Heart Surgery;  Laterality: N/A;  . CYSTOSCOPY WITH LITHOLAPAXY N/A 07/15/2017   Procedure: OPEN CYSTOLITHOTOMY;  Surgeon: Crista Elliot, MD;  [facility];  Service: Urology;  Laterality: N/A;  .  INGUINAL HERNIA REPAIR Right 2011  . INSERTION OF SUPRAPUBIC CATHETER N/A 07/15/2017   Procedure: INSERTION OF SUPRAPUBIC CATHETER;  Surgeon: Crista Elliot, MD;  [facility];  Service: Urology;  Laterality: N/A;  . LEFT HEART CATH AND CORONARY ANGIOGRAPHY N/A 09/03/2018   Procedure: LEFT HEART CATH AND CORONARY ANGIOGRAPHY;  Surgeon: Iran Ouch, MD;  [facility];  Service: Cardiovascular;  Laterality: N/A;  . LOOP RECORDER IMPLANT N/A 01/05/2015   Procedure: LOOP RECORDER IMPLANT;  Surgeon: Thurmon Fair, MD;  Location[facility];  Service: Cardiovascular;  Laterality: N/A;  . ORIF WRIST FRACTURE Left 03/20/2014   Procedure: OPEN REDUCTION INTERNAL FIXATION (ORIF) WRIST FRACTURE;  Surgeon: Dominica Severin, MD;  [facility];  Service: Orthopedics;  Laterality: Left;  . TEE WITHOUT CARDIOVERSION N/A 01/05/2015   Procedure: TRANSESOPHAGEAL ECHOCARDIOGRAM (TEE)/LOOP;  Surgeon: Thurmon Fair, MD;  [facility];  Service: Cardiovascular;  Laterality: N/A; ef 60-65%, no LA or RA thrombus, trivial PR, mild TR  . TEE WITHOUT CARDIOVERSION N/A 09/05/2018   Procedure: TRANSESOPHAGEAL ECHOCARDIOGRAM (TEE);  Surgeon: Donata Clay, Theron Arista, MD;  Location: [facility];  Service: Open Heart Surgery;  Laterality: N/A;  . TONSILLECTOMY  child  . TOTAL HIP ARTHROPLASTY Right 2000    MEDICATIONS: . apixaban (ELIQUIS) 5 MG TABS tablet  . atorvastatin (LIPITOR) 80 MG tablet  . benzonatate (TESSALON) 200 MG capsule  . cephALEXin (KEFLEX) 500 MG capsule  . lisinopril (ZESTRIL) 2.5 MG tablet  . mometasone-formoterol (DULERA) 100-5 MCG/ACT AERO  . pantoprazole (PROTONIX) 40 MG tablet  . umeclidinium bromide (INCRUSE ELLIPTA) 62.5 MCG/INH AEPB  . vitamin B-12 (CYANOCOBALAMIN) 100 MCG tablet   No current facility-administered medications for this encounter.      Janey Genta [facility] Pre[facility]Surgical Testing [phone] 08/14/19  1:34 PM

## 2019-08-14 NOTE — Progress Notes (Signed)
Pt currently in [facility] ED. Cancelling pt's covid appointment for today.   Jacqlyn Larsen, RN

## 2019-08-14 NOTE — ED Triage Notes (Addendum)
Patient arrived by EMS from home.EMS staged colostomy bag came off. EMS reported colostomy issue.   This Probation officer has assessed the patient and it appears to be a suprapubic catheter issue, NOT a colostomy.   Patient c/o penile pain. Patient rates pain 10/10. Patient states it's constant pain. Patient states urine is draining on his clothes.

## 2019-08-16 ENCOUNTER — Encounter (HOSPITAL_COMMUNITY): Payer: Self-pay | Admitting: Surgery

## 2019-08-16 DIAGNOSIS — K4030 Unilateral inguinal hernia, with obstruction, without gangrene, not specified as recurrent: Secondary | ICD-10-CM | POA: Diagnosis present

## 2019-08-16 NOTE — Consult Note (Signed)
General Surgery [facility]  Reino Bath DOB: May 17, 1946 Single / Language: English / Race: Black or African American Male   History of Present Illness  The patient is a 73 year old male who presents with an inguinal hernia.  CHIEF COMPLAINT: left inguinal hernia, incarcerated  Patient is self-referred. His primary care physician is Dr. Domenic Schwab. His urologist is Dr. Gloriann Loan. His cardiologist is Dr. Loletha Grayer. Patient has had a long-standing left inguinal hernia which is now incarcerated. He was told by Dr. Gloriann Loan that he needed to come have it repaired. Patient has had a previous right inguinal hernia repair many years ago. He has had no signs of recurrence. Patient does have a suprapubic tube in place. He denies any signs or symptoms of intestinal obstruction. Patient has a cardiac arrhythmia and he is followed by his cardiologist. He states he is not on anticoagulation. He has had previous open heart surgery. He will require cardiac clearance prior to posting him for hernia repair.  Allergies No Known Allergies Allergies Reconciled   Medication History Atorvastatin Calcium (80MG  Tablet, Oral) Active. Benzonatate (200MG  Capsule, Oral) Active. Carvedilol (6.25MG  Tablet, Oral) Active. Lisinopril (2.5MG  Tablet, Oral) Active. Pantoprazole Sodium (40MG  Tablet DR, Oral) Active. B-12 (100MCG Tablet, Oral) Active. Dulera (100-5MCG/ACT Aerosol, Inhalation) Active. Eliquis (5MG  Tablet, Oral) Active. Incruse Ellipta (62.5MCG/INH Aero Pow Br Act, Inhalation) Active. Medications Reconciled  Social History Alcohol use  Moderate alcohol use. Caffeine use  Carbonated beverages. Illicit drug use  Remotely quit drug use. Tobacco use  Current every day smoker.  Other Problems Atrial Fibrillation  Gastric Ulcer  High blood pressure  Inguinal Hernia   Review of Systems General Present- Appetite Loss. Not Present- Chills, Fatigue, Fever, Night  Sweats, Weight Gain and Weight Loss. Respiratory Present- Difficulty Breathing. Not Present- Bloody sputum, Chronic Cough, Snoring and Wheezing. Breast Not Present- Breast Mass, Breast Pain, Nipple Discharge and Skin Changes. Cardiovascular Present- Leg Cramps, Rapid Heart Rate, Shortness of Breath and Swelling of Extremities. Not Present- Chest Pain, Difficulty Breathing Lying Down and Palpitations. Gastrointestinal Present- Gets full quickly at meals and Indigestion. Not Present- Abdominal Pain, Bloating, Bloody Stool, Change in Bowel Habits, Chronic diarrhea, Constipation, Difficulty Swallowing, Excessive gas, Hemorrhoids, Nausea, Rectal Pain and Vomiting. Musculoskeletal Present- Joint Stiffness and Swelling of Extremities. Not Present- Back Pain, Joint Pain, Muscle Pain and Muscle Weakness. Neurological Present- Numbness, Trouble walking and Weakness. Not Present- Decreased Memory, Fainting, Headaches, Seizures, Tingling and Tremor. Psychiatric Present- Fearful. Not Present- Anxiety, Bipolar, Change in Sleep Pattern, Depression and Frequent crying.  Vitals Weight: 111.25 lb Height: 66in Body Surface Area: 1.56 m Body Mass Index: 17.96 kg/m  Temp.: 98.22F  Pulse: 44 (Regular)  P.OX: 93% (Room air) BP: 164/80(Sitting, Left Arm, Standard)  Physical Exam   See vital signs recorded above  GENERAL APPEARANCE Development: normal Nutritional status: normal Gross deformities: none  SKIN Rash, lesions, ulcers: none Induration, erythema: none Nodules: none palpable  EYES Conjunctiva and lids: normal Pupils: equal and reactive Iris: normal bilaterally  EARS, NOSE, MOUTH, THROAT External ears: no lesion or deformity External nose: no lesion or deformity Hearing: grossly normal Patient is wearing a mask  NECK Symmetric: yes Trachea: midline Thyroid: no palpable nodules in the thyroid bed  CHEST Respiratory effort: normal Retraction or accessory muscle use:  no Breath sounds: normal bilaterally Rales, rhonchi, wheeze: none  CARDIOVASCULAR Auscultation: irregular rhythm, normal rate Murmurs: none Lower extremity edema: none Lower extremity varicosities: none  ABDOMEN Distension: none Masses: none palpable  Tenderness: none Hepatosplenomegaly: not present Hernia: not present  GENITOURINARY Penis: no lesions Scrotum: On the left, there is a large scrotal hernia. Testicle is normal to palpation. Hernia is incarcerated and is not fully reducible. There is a suprapubic tube in place just to the right of midline  MUSCULOSKELETAL Station and gait: Uses a cane Digits and nails: no clubbing or cyanosis Muscle strength: grossly normal all extremities Range of motion: grossly normal all extremities Deformity: none  LYMPHATIC Cervical: none palpable Supraclavicular: none palpable  PSYCHIATRIC Oriented to person, place, and time: yes Mood and affect: normal for situation Judgment and insight: appropriate for situation    Assessment & Plan   INCARCERATED LEFT INGUINAL HERNIA (K40.30) CARDIAC ARRHYTHMIA (I49.9)  The patient presents today for evaluation of left inguinal hernia. This is been present for a number of years. It has gradually become larger. It is now incarcerated. Patient was told by his urologist that he needed to have it repaired.  I have recommended open left inguinal hernia repair with mesh patch. We discussed the procedure and the location of the surgical incision. We discussed restrictions on his activities after surgery. We discussed the possibility of recurrence.  Patient will require cardiac clearance prior to scheduling surgery.  We will plan to perform this as an outpatient surgical procedure. We discussed his recovery. Patient understands and wishes to proceed with surgery in the near future.  The risks and benefits of the procedure have been discussed at length with the patient. The patient  understands the proposed procedure, potential alternative treatments, and the course of recovery to be expected. All of the patient's questions have been answered at this time. The patient wishes to proceed with surgery.  Darnell Level, MD [facility] Surgery Office: [phone]

## 2019-08-17 ENCOUNTER — Encounter (HOSPITAL_COMMUNITY): Admission: RE | Payer: Self-pay | Source: Home / Self Care

## 2019-08-17 ENCOUNTER — Ambulatory Visit (HOSPITAL_COMMUNITY): Admission: RE | Admit: 2019-08-17 | Payer: Medicare Other | Source: Home / Self Care | Admitting: Surgery

## 2019-08-17 ENCOUNTER — Telehealth (HOSPITAL_COMMUNITY): Payer: Self-pay | Admitting: *Deleted

## 2019-08-17 SURGERY — REPAIR, HERNIA, INGUINAL, ADULT
Anesthesia: General | Laterality: Left

## 2019-08-17 MED ORDER — BUPIVACAINE HCL (PF) 0.5 % IJ SOLN
INTRAMUSCULAR | Status: AC
  Filled 2019-08-17: qty 30

## 2019-08-21 ENCOUNTER — Other Ambulatory Visit (HOSPITAL_COMMUNITY)
Admission: RE | Admit: 2019-08-21 | Discharge: 2019-08-21 | Disposition: A | Discharge status: Home / Self Care | Payer: Medicare Other | Source: Ambulatory Visit | Attending: Surgery | Admitting: Surgery

## 2019-08-21 DIAGNOSIS — Z20828 Contact with and (suspected) exposure to other viral communicable diseases: Secondary | ICD-10-CM | POA: Diagnosis not present

## 2019-08-21 DIAGNOSIS — Z01812 Encounter for preprocedural laboratory examination: Secondary | ICD-10-CM | POA: Diagnosis present

## 2019-08-22 LAB — NOVEL CORONAVIRUS, NAA (HOSP ORDER, SEND-OUT TO REF LAB; TAT 18-24 HRS): SARS-CoV-2, NAA: NOT DETECTED

## 2019-08-24 ENCOUNTER — Other Ambulatory Visit: Payer: Self-pay

## 2019-08-24 ENCOUNTER — Encounter (HOSPITAL_COMMUNITY): Payer: Self-pay | Admitting: Certified Registered Nurse Anesthetist

## 2019-08-24 ENCOUNTER — Encounter (HOSPITAL_COMMUNITY): Payer: Self-pay

## 2019-08-24 ENCOUNTER — Encounter (HOSPITAL_COMMUNITY)
Admission: RE | Admit: 2019-08-24 | Discharge: 2019-08-24 | Disposition: A | Discharge status: Home / Self Care | Payer: Medicare Other | Source: Ambulatory Visit | Attending: Surgery | Admitting: Surgery

## 2019-08-24 ENCOUNTER — Encounter (HOSPITAL_COMMUNITY): Payer: Self-pay | Admitting: Physician Assistant

## 2019-08-24 DIAGNOSIS — K4090 Unilateral inguinal hernia, without obstruction or gangrene, not specified as recurrent: Secondary | ICD-10-CM | POA: Insufficient documentation

## 2019-08-24 DIAGNOSIS — Z01812 Encounter for preprocedural laboratory examination: Secondary | ICD-10-CM | POA: Diagnosis not present

## 2019-08-24 LAB — CBC
HCT: 38.2 % — ABNORMAL LOW (ref 39.0–52.0)
Hemoglobin: 12.5 g/dL — ABNORMAL LOW (ref 13.0–17.0)
MCH: 35.3 pg — ABNORMAL HIGH (ref 26.0–34.0)
MCHC: 32.7 g/dL (ref 30.0–36.0)
MCV: 107.9 fL — ABNORMAL HIGH (ref 80.0–100.0)
Platelets: 298 10*3/uL (ref 150–400)
RBC: 3.54 MIL/uL — ABNORMAL LOW (ref 4.22–5.81)
RDW: 14.2 % (ref 11.5–15.5)
WBC: 7.9 10*3/uL (ref 4.0–10.5)
nRBC: 0 % (ref 0.0–0.2)

## 2019-08-24 LAB — BASIC METABOLIC PANEL
Anion gap: 10 (ref 5–15)
BUN: 8 mg/dL (ref 8–23)
CO2: 26 mmol/L (ref 22–32)
Calcium: 9 mg/dL (ref 8.9–10.3)
Chloride: 103 mmol/L (ref 98–111)
Creatinine, Ser: 1.05 mg/dL (ref 0.61–1.24)
GFR calc Af Amer: >60 mL/min (ref >60)
GFR calc non Af Amer: >60 mL/min (ref >60)
Glucose, Bld: 85 mg/dL (ref 70–99)
Potassium: 3.9 mmol/L (ref 3.5–5.1)
Sodium: 139 mmol/L (ref 135–145)

## 2019-08-24 NOTE — Progress Notes (Signed)
PCP - Dr. Radene Knee Cardiologist - Dr. Jerilynn Mages. Croitor[place]  Chest x-ray - 01/03/19 EKG - 06/01/19 Stress Test - no ECHO - 05/02/19 Cardiac Cath - 09/03/18  Sleep Study - no CPAP -   Fasting Blood Sugar - NA Checks Blood Sugar _____ times a day  Blood Thinner Instructions:Eliquis Aspirin Instructions:stop 48 hours prior to surgery. Instructed by Dr. Gala Lewandowsky office Last Dose:08/23/19  Anesthesia review:   Patient denies shortness of breath, fever, cough and chest pain at PAT appointment yes  Patient verbalized understanding of instructions that were given to them at the PAT appointment. Patient was also instructed that they will need to review over the PAT instructions again at home before surgery. yes

## 2019-08-24 NOTE — Patient Instructions (Signed)
DUE TO COVID-19 ONLY ONE VISITOR IS ALLOWED TO COME WITH YOU AND STAY IN THE WAITING ROOM ONLY DURING PRE OP AND PROCEDURE DAY OF SURGERY. THE 1 VISITOR MAY VISIT WITH YOU AFTER SURGERY IN YOUR PRIVATE ROOM DURING VISITING HOURS ONLY!   PLEASE BEGIN THE QUARANTINE INSTRUCTIONS AS OUTLINED IN YOUR HANDOUT.                Kier Thurow   Your procedure is scheduled on: 08/25/19   Report to [address] Main  Entrance   Report to admitting at   11:30 AM     Call this number if you have problems the morning of surgery [phone]     Remember: Do not eat food or drink liquids :After Midnight.   BRUSH YOUR TEETH MORNING OF SURGERY AND RINSE YOUR MOUTH OUT, NO CHEWING GUM CANDY OR MINTS.     Take these medicines the morning of surgery with A SIP OF WATER:   DO NOT TAKE ANY DIABETIC MEDICATIONS DAY OF YOUR SURGERY                               You may not have any metal on your body piercings             Do not wear jewelry,  lotions, powders or  deodorant                         Men may shave face and neck.   Do not bring valuables to the hospital. [place].  Contacts, dentures or bridgework may not be worn into surgery.       Patients discharged the day of surgery will not be allowed to drive home.   IF YOU ARE HAVING SURGERY AND GOING HOME THE SAME DAY, YOU MUST HAVE AN ADULT TO DRIVE YOU HOME AND BE WITH YOU FOR 24 HOURS.  YOU MAY GO HOME BY TAXI OR UBER OR ORTHERWISE, BUT AN ADULT MUST ACCOMPANY YOU HOME AND STAY WITH YOU FOR 24 HOURS.  Name and phone number of your driver:  Special Instructions: N/A              Please read over the following fact sheets you were given: _____________________________________________________________________             [facility] - Preparing for Surgery Before surgery, you can play an important role .  Because skin is not sterile, your skin needs to be as free of germs as  possible.   You can reduce the number of germs on your skin by washing with CHG (chlorahexidine gluconate) soap before surgery.   CHG is an antiseptic cleaner which kills germs and bonds with the skin to continue killing germs even after washing. Please DO NOT use if you have an allergy to CHG or antibacterial soaps .  If your skin becomes reddened/irritated stop using the CHG and inform your nurse when you arrive at Short Stay.  You may shave your face/neck.  Please follow these instructions carefully:  1.  Shower with CHG Soap the night before surgery and the  morning of Surgery.  2.  If you choose to wash your hair, wash your hair first as usual with your  normal  shampoo.  3.  After you shampoo,  rinse your hair and body thoroughly to remove the  shampoo.                                        4.  Use CHG as you would any other liquid soap.  You can apply chg directly  to the skin and wash                       Gently with a scrungie or clean washcloth.  5.  Apply the CHG Soap to your body ONLY FROM THE NECK DOWN.   Do not use on face/ open                           Wound or open sores. Avoid contact with eyes, ears mouth and genitals (private parts).                       Wash face,  Genitals (private parts) with your normal soap.             6.  Wash thoroughly, paying special attention to the area where your surgery  will be performed.  7.  Thoroughly rinse your body with warm water from the neck down.  8.  DO NOT shower/wash with your normal soap after using and rinsing off  the CHG Soap.             9.  Pat yourself dry with a clean towel.            10.  Wear clean pajamas.            11.  Place clean sheets on your bed the night of your first shower and do not  sleep with pets. Day of Surgery : Do not apply any lotions/deodorants the morning of surgery.  Please wear clean clothes to the hospital/surgery center.  FAILURE TO FOLLOW THESE INSTRUCTIONS MAY RESULT IN THE CANCELLATION OF  YOUR SURGERY PATIENT SIGNATURE_________________________________  NURSE SIGNATURE__________________________________  ________________________________________________________________________

## 2019-08-25 ENCOUNTER — Encounter (HOSPITAL_COMMUNITY): Admission: RE | Payer: Self-pay | Source: Home / Self Care

## 2019-08-25 ENCOUNTER — Telehealth (HOSPITAL_COMMUNITY): Payer: Self-pay | Admitting: *Deleted

## 2019-08-25 ENCOUNTER — Ambulatory Visit (HOSPITAL_COMMUNITY): Admission: RE | Admit: 2019-08-25 | Payer: Medicare Other | Source: Home / Self Care | Admitting: Surgery

## 2019-08-25 SURGERY — REPAIR, HERNIA, INGUINAL, ADULT
Anesthesia: General | Laterality: Left

## 2019-08-25 MED ORDER — PROPOFOL 10 MG/ML IV BOLUS
INTRAVENOUS | Status: AC
  Filled 2019-08-25: qty 20

## 2019-08-25 MED ORDER — ONDANSETRON HCL 4 MG/2ML IJ SOLN
INTRAMUSCULAR | Status: AC
  Filled 2019-08-25: qty 2

## 2019-08-25 MED ORDER — LIDOCAINE 2% (20 MG/ML) 5 ML SYRINGE
INTRAMUSCULAR | Status: AC
  Filled 2019-08-25: qty 5

## 2019-08-25 MED ORDER — FENTANYL CITRATE (PF) 250 MCG/5ML IJ SOLN
INTRAMUSCULAR | Status: AC
  Filled 2019-08-25: qty 5

## 2019-08-25 MED ORDER — ESMOLOL HCL 100 MG/10ML IV SOLN
INTRAVENOUS | Status: AC
  Filled 2019-08-25: qty 10

## 2019-08-25 MED ORDER — PROPOFOL 500 MG/50ML IV EMUL
INTRAVENOUS | Status: AC
  Filled 2019-08-25: qty 100

## 2019-08-27 ENCOUNTER — Other Ambulatory Visit: Payer: Self-pay

## 2019-08-27 ENCOUNTER — Emergency Department (HOSPITAL_COMMUNITY)
Admission: EM | Admit: 2019-08-27 | Discharge: 2019-08-27 | Disposition: A | Discharge status: Home / Self Care | Payer: Medicare Other | Attending: Emergency Medicine | Admitting: Emergency Medicine

## 2019-08-27 ENCOUNTER — Encounter (HOSPITAL_COMMUNITY): Payer: Self-pay

## 2019-08-27 DIAGNOSIS — J449 Chronic obstructive pulmonary disease, unspecified: Secondary | ICD-10-CM | POA: Insufficient documentation

## 2019-08-27 DIAGNOSIS — Z951 Presence of aortocoronary bypass graft: Secondary | ICD-10-CM | POA: Diagnosis not present

## 2019-08-27 DIAGNOSIS — T83510A Infection and inflammatory reaction due to cystostomy catheter, initial encounter: Secondary | ICD-10-CM | POA: Diagnosis not present

## 2019-08-27 DIAGNOSIS — Z7901 Long term (current) use of anticoagulants: Secondary | ICD-10-CM | POA: Diagnosis not present

## 2019-08-27 DIAGNOSIS — F17210 Nicotine dependence, cigarettes, uncomplicated: Secondary | ICD-10-CM | POA: Insufficient documentation

## 2019-08-27 DIAGNOSIS — Y732 Prosthetic and other implants, materials and accessory gastroenterology and urology devices associated with adverse incidents: Secondary | ICD-10-CM | POA: Diagnosis not present

## 2019-08-27 DIAGNOSIS — Z95818 Presence of other cardiac implants and grafts: Secondary | ICD-10-CM | POA: Diagnosis not present

## 2019-08-27 DIAGNOSIS — I10 Essential (primary) hypertension: Secondary | ICD-10-CM | POA: Diagnosis not present

## 2019-08-27 DIAGNOSIS — Z96641 Presence of right artificial hip joint: Secondary | ICD-10-CM | POA: Insufficient documentation

## 2019-08-27 DIAGNOSIS — Z79899 Other long term (current) drug therapy: Secondary | ICD-10-CM | POA: Insufficient documentation

## 2019-08-27 DIAGNOSIS — T83198A Other mechanical complication of other urinary devices and implants, initial encounter: Secondary | ICD-10-CM | POA: Insufficient documentation

## 2019-08-27 DIAGNOSIS — Z435 Encounter for attention to cystostomy: Secondary | ICD-10-CM | POA: Diagnosis present

## 2019-08-27 DIAGNOSIS — N390 Urinary tract infection, site not specified: Secondary | ICD-10-CM | POA: Insufficient documentation

## 2019-08-27 DIAGNOSIS — T83090A Other mechanical complication of cystostomy catheter, initial encounter: Secondary | ICD-10-CM

## 2019-08-27 DIAGNOSIS — T83511A Infection and inflammatory reaction due to indwelling urethral catheter, initial encounter: Secondary | ICD-10-CM

## 2019-08-27 LAB — URINALYSIS, ROUTINE W REFLEX MICROSCOPIC
Bilirubin Urine: NEGATIVE
Glucose, UA: NEGATIVE mg/dL
Ketones, ur: 5 mg/dL — AB
Nitrite: POSITIVE — AB
Protein, ur: 30 mg/dL — AB
Specific Gravity, Urine: 1.009 (ref 1.005–1.030)
pH: 7 (ref 5.0–8.0)

## 2019-08-27 MED ORDER — CEPHALEXIN 500 MG PO CAPS
500.0000 mg | ORAL_CAPSULE | Freq: Once | ORAL | Status: AC
  Administered 2019-08-27: 18:00:00 500 mg via ORAL
  Filled 2019-08-27: qty 1

## 2019-08-27 MED ORDER — CEPHALEXIN 500 MG PO CAPS: 500.0000 mg | ORAL_CAPSULE | Freq: Four times a day (QID) | ORAL | 0 refills | Status: DC

## 2019-08-27 NOTE — ED Notes (Signed)
Catheter flushed with 2ml NS with syringe. Return aspirated 8ml, and catheter appears to be draining properly into leg bag currently.

## 2019-08-27 NOTE — ED Triage Notes (Addendum)
EMS reports from home, Pt states leaking suprapubic cather at insertion point.  BP 176/98 HR 58 RR 18 Sp02 100 RA Temp 98.9

## 2019-08-27 NOTE — ED Provider Notes (Signed)
[facility]-EMERGENCY DEPT Provider Note   CSN: [id number] Arrival date & time: 08/27/19  1446     History   Chief Complaint Chief Complaint  Patient presents with  . Leaking Suprapubic catheter    HPI Joe Durham is a 73 y.o. male.     Patient presents today with leakage around his suprapubic catheter. History of occluded catheters in the past. He states he was having urine output into the bag up until this morning. No fever or chills. Mild suprapubic discomfort.  The history is provided by the patient and medical records.  Dysuria Presenting symptoms: dysuria   Relieved by:  None tried Ineffective treatments:  None tried Associated symptoms: abdominal pain and urinary retention   Risk factors: urinary catheter     Past Medical History:  Diagnosis Date  . Atrial fibrillation (HCC)   . Bladder calculi   . COPD (chronic obstructive pulmonary disease) (HCC)   . DOE (dyspnea on exertion)   . Elevated blood pressure reading    12-22-2018 reprots he hasnt had his BP meds since this past thursday   . Foley catheter in place   . History of bladder stone   . 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 History of urinary retention   . Hypertension   . Mobitz type 1 second degree atrioventricular block   . Myocardial infarction [facility]) 2019   non stemi  . Osteoarthritis   . PAD (peripheral artery disease) (HCC)   . Paroxysmal atrial tachycardia (HCC)   . Peripheral vascular disease (HCC)    last duplex 09-25-2016 bilateral ABI 0.6 and occulsion of  right popiteal posterior tibials and left peroneal  . Presence of suprapubic catheter (HCC)   . Stroke (HCC)   . Wears glasses     Patient Active Problem List   Diagnosis Date Noted  . Incarcerated left inguinal hernia 08/16/2019  . Long term current use of anticoagulant therapy 06/02/2019  .  COPD (chronic obstructive pulmonary disease) (HCC) 06/01/2019  . B12 deficiency 05/04/2019  . Left inguinal hernia 05/04/2019  . Cough   . Anemia   . Bacteremia due to Gram-negative bacteria 05/02/2019  . Sepsis (HCC) 05/01/2019  . New onset a-fib (HCC) 05/01/2019  . Pre-operative clearance 12/15/2018  . 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 NSTEMI (non-ST elevated myocardial infarction) (HCC) 09/03/2018  . Malnutrition of moderate degree 09/03/2018  . History of loop recorder 01/23/2018  . Mobitz type 1 second degree AV block 07/30/2017  . PAD (peripheral artery disease) (HCC) 07/30/2017  . Bladder calculi 07/15/2017  . UTI (urinary tract infection) 06/13/2017  . Acute cystitis with hematuria   . Gross hematuria 06/12/2017  . Renal insufficiency 06/12/2017  . Complicated UTI (urinary tract infection) 06/12/2017  . Acute pyelonephritis 06/12/2017  . Uncomplicated alcohol dependence (HCC)   . AKI (acute kidney injury) (HCC)   . Claudication (HCC) 11/14/2016  . Encounter for loop recorder check 07/19/2015  . First degree AV block 07/19/2015  . PAT (paroxysmal atrial tachycardia) (HCC) 07/19/2015  . Essential hypertension 04/05/2015  . Cocaine abuse (HCC) 04/05/2015  . Hospital discharge follow-up 01/10/2015  . 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 Lung mass   . Acute embolic stroke (HCC) 01/04/2015  . Cerebral infarction due to unspecified mechanism   . Visual changes   .  Substance abuse (HCC)   . Dyslipidemia, goal LDL below 70   . History of stroke 01/03/2015  . Tobacco abuse 04/12/2014  . Bladder stones 04/12/2014    Past Surgical History:  Procedure Laterality Date  . CORONARY ARTERY BYPASS GRAFT N/A 09/05/2018   Procedure: CORONARY ARTERY BYPASS GRAFTING (CABG) times 3 using left internal mammary artery and right greater saphenous vein harvested endoscopically.;  Surgeon: Kerin Perna, MD;  Location[facility] OR;  Service: Open  Heart Surgery;  Laterality: N/A;  . CYSTOSCOPY WITH LITHOLAPAXY N/A 07/15/2017   Procedure: OPEN CYSTOLITHOTOMY;  Surgeon: Crista Elliot, MD;  Location: [facility] ORS;  Service: Urology;  Laterality: N/A;  . INGUINAL HERNIA REPAIR Right 2011  . INSERTION OF SUPRAPUBIC CATHETER N/A 07/15/2017   Procedure: INSERTION OF SUPRAPUBIC CATHETER;  Surgeon: Crista Elliot, MD;  Location: [facility] ORS;  Service: Urology;  Laterality: N/A;  . LEFT HEART CATH AND CORONARY ANGIOGRAPHY N/A 09/03/2018   Procedure: LEFT HEART CATH AND CORONARY ANGIOGRAPHY;  Surgeon: Iran Ouch, MD;  Location: [facility] INVASIVE CV LAB;  Service: Cardiovascular;  Laterality: N/A;  . LOOP RECORDER IMPLANT N/A 01/05/2015   Procedure: LOOP RECORDER IMPLANT;  Surgeon: Thurmon Fair, MD;  Location: [facility] CATH LAB;  Service: Cardiovascular;  Laterality: N/A;  . ORIF WRIST FRACTURE Left 03/20/2014   Procedure: OPEN REDUCTION INTERNAL FIXATION (ORIF) WRIST FRACTURE;  Surgeon: Dominica Severin, MD;  Location: [facility] OR;  Service: Orthopedics;  Laterality: Left;  . TEE WITHOUT CARDIOVERSION N/A 01/05/2015   Procedure: TRANSESOPHAGEAL ECHOCARDIOGRAM (TEE)/LOOP;  Surgeon: Thurmon Fair, MD;  Location: [facility] ENDOSCOPY;  Service: Cardiovascular;  Laterality: N/A; ef 60-65%, no LA or RA thrombus, trivial PR, mild TR  . TEE WITHOUT CARDIOVERSION N/A 09/05/2018   Procedure: TRANSESOPHAGEAL ECHOCARDIOGRAM (TEE);  Surgeon: Donata Clay, Theron Arista, MD;  Location: [facility] OR;  Service: Open Heart Surgery;  Laterality: N/A;  . TONSILLECTOMY  child  . TOTAL HIP ARTHROPLASTY Right 2000        Home Medications    Prior to Admission medications   Medication Sig Start Date End Date Taking? Authorizing Provider  apixaban (ELIQUIS) 5 MG TABS tablet Take 1 tablet (5 mg total) by mouth 2 (two) times daily. 05/06/19   Regalado, Belkys A, MD  atorvastatin (LIPITOR) 80 MG tablet Take 1 tablet (80 mg total) by mouth daily. 12/15/18   Abelino Derrick, PA-C  benzonatate (TESSALON) 200 MG capsule  Take 1 capsule (200 mg total) by mouth 3 (three) times daily. Patient not taking: Reported on 08/01/2019 05/06/19   Regalado, Jon Billings A, MD  cephALEXin (KEFLEX) 500 MG capsule Take 1 capsule (500 mg total) by mouth 3 (three) times daily. 08/14/19   Geoffery Lyons, MD  lisinopril (ZESTRIL) 2.5 MG tablet Take 1 tablet (2.5 mg total) by mouth daily. 12/15/18   Kilroy, Eda Paschal, PA-C  mometasone-formoterol (DULERA) 100-5 MCG/ACT AERO Inhale 2 puffs into the lungs 2 (two) times daily. Patient taking differently: Inhale 2 puffs into the lungs 2 (two) times daily as needed for wheezing.  05/06/19   Regalado, Belkys A, MD  pantoprazole (PROTONIX) 40 MG tablet Take 1 tablet (40 mg total) by mouth daily at 6 (six) AM. 05/07/19   Regalado, Belkys A, MD  umeclidinium bromide (INCRUSE ELLIPTA) 62.5 MCG/INH AEPB Inhale 1 puff into the lungs daily. Patient not taking: Reported on 08/01/2019 05/07/19   Regalado, Jon Billings A, MD  vitamin B-12 (CYANOCOBALAMIN) 100 MCG tablet Take 1 tablet (100 mcg total) by mouth daily. 05/06/19  05/05/20  Regalado, Prentiss BellsBelkys A, MD    Family History Family History  Problem Relation Age of Onset  . Stroke Mother   . Stroke Sister     Social History Social History   Tobacco Use  . Smoking status: Current Every Day Smoker    Packs/day: 0.25    Years: 52.00    Pack years: 13.00    Types: Cigarettes  . Smokeless tobacco: Never Used  . Tobacco comment: 07-08-2018 pt is down to 1ppw from 1 ppd, 12-22-2018 about  half a pack per day   Substance Use Topics  . Alcohol use: Yes    Alcohol/week: 0.0 standard drinks    Comment: 08/2018 ! drink 1 pint a day 3 times a week on average  . Drug use: No    Comment: history of cocaine ; last use 7 months ago      Allergies   Lactose intolerance (gi)   Review of Systems Review of Systems  Gastrointestinal: Positive for abdominal pain.  Genitourinary: Positive for decreased urine volume and dysuria.  All other systems reviewed and are  negative.    Physical Exam Updated Vital Signs BP (!) 182/105 (BP Location: Right Arm)   Pulse 83   Temp 97.9 F (36.6 C) (Oral)   Resp 16   Ht 5\' 8"  (1.727 m)   Wt 59 kg   SpO2 98%   BMI 19.77 kg/m   Physical Exam Vitals signs and nursing note reviewed.  Constitutional:      Appearance: Normal appearance. He is not ill-appearing.  HENT:     Head: Normocephalic.     Mouth/Throat:     Mouth: Mucous membranes are moist.  Eyes:     Conjunctiva/sclera: Conjunctivae normal.  Cardiovascular:     Rate and Rhythm: Normal rate and regular rhythm.  Pulmonary:     Effort: Pulmonary effort is normal.     Breath sounds: Normal breath sounds.  Abdominal:     General: Abdomen is flat.     Palpations: Abdomen is soft.     Tenderness: There is abdominal tenderness. There is no guarding.     Comments: Mild suprapubic discomfort around catheter insertion site. No erythema.  Musculoskeletal: Normal range of motion.  Skin:    General: Skin is warm and dry.  Neurological:     Mental Status: He is alert and oriented to person, place, and time.  Psychiatric:        Mood and Affect: Mood normal.      ED Treatments / Results  Labs (all labs ordered are listed, but only abnormal results are displayed) Labs Reviewed - No data to display  EKG None  Radiology No results found.  Procedures Procedures (including critical care time)  Medications Ordered in ED Medications - No data to display   Initial Impression / Assessment and Plan / ED Course  I have reviewed the triage vital signs and the nursing notes.  Pertinent labs & imaging results that were available during my care of the patient were reviewed by me and considered in my medical decision making (see chart for details).        Patient discussed with Dr. Silverio LayYao.  Labs results interpreted by me, consistent with UTI. Catheter easily flushed with normal saline, with return of brisk drainage into collection bag. Culture  requested, with cover with keflex as prior cultures have been sensitive to rocephin.  Pt diagnosed with a UTI. Pt is afebrile, without tachycardia, hypotension, or other signs of  serious infection.  Pt to be dc home with antibiotics and instructions to follow up with PCP if symptoms persist. Discussed return precautions. Pt appears safe for discharge.  Final Clinical Impressions(s) / ED Diagnoses   Final diagnoses:  Obstruction of suprapubic catheter, initial encounter ([place])  Urinary tract infection associated with catheterization of urinary tract, unspecified indwelling urinary catheter type, initial encounter [facility])    ED Discharge Orders         Ordered    cephALEXin (KEFLEX) 500 MG capsule  4 times daily     08/27/19 1709           Etta Quill, NP 08/27/19 1715    Drenda Freeze, MD 09/01/19 [phone]

## 2019-08-28 LAB — URINE CULTURE

## 2019-08-28 NOTE — TOC Initial Note (Addendum)
Transition of Care [facility]) - Initial/Assessment Note    Patient Details  Name: Joe Durham MRN: [id number] Date of Birth: 11/15/1945  [facility] of [facility] CM/SW Contact:    Elliot Cousin, RN Phone Number: [phone] 08/28/2019, 1:03 PM  Clinical Narrative:                 Spoke to pt at bedside. States he does not have a PCP or an Urologist to manage suprapubic cath. He was seeing Dr. Alvester Morin, Urology. States he has family and friends that take him to his appts. Provided pt with Medicaid Transportation number to get ride to his appts. Will arrange appt with [facility] to establish with PCP.   Expected Discharge Plan: Home/Self Care Barriers to Discharge: No Barriers Identified   Patient Goals and CMS Choice        Expected Discharge Plan and Services Expected Discharge Plan: Home/Self Care In-house Referral: PCP / [facility] Discharge Planning Services: CM Consult   Living arrangements for the past 2 months: Apartment                                      Prior Living Arrangements/Services Living arrangements for the past 2 months: Apartment Lives with:: Self Patient language and need for interpreter reviewed:: Yes Do you feel safe going back to the place where you live?: Yes      Need for Family Participation in Patient Care: No (Comment) Care giver support system in place?: No (comment)   Criminal Activity/Legal Involvement Pertinent to Current Situation/Hospitalization: No - Comment as needed  Activities of Daily Living      Permission Sought/Granted Permission sought to share information with : Case Manager Permission granted to share information with : Yes, Verbal Permission Granted              Emotional Assessment Appearance:: Appears stated age Attitude/Demeanor/Rapport: Engaged Affect (typically observed): Accepting Orientation: : Oriented to Self, Oriented to Place, Oriented to  Time, Oriented to Situation Alcohol / Substance Use:  Tobacco Use Psych Involvement: No (comment)  Admission diagnosis:  foley issue; abd pain Patient Active Problem List   Diagnosis Date Noted  . Incarcerated left inguinal hernia 08/16/2019  . Long term current use of anticoagulant therapy 06/02/2019  . COPD (chronic obstructive pulmonary disease) (HCC) 06/01/2019  . B12 deficiency 05/04/2019  . Left inguinal hernia 05/04/2019  . Cough   . Anemia   . Bacteremia due to Gram-negative bacteria 05/02/2019  . Sepsis (HCC) 05/01/2019  . New onset a-fib (HCC) 05/01/2019  . Pre-operative clearance 12/15/2018  . 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 NSTEMI (non-ST elevated myocardial infarction) (HCC) 09/03/2018  . Malnutrition of moderate degree 09/03/2018  . History of loop recorder 01/23/2018  . Mobitz type 1 second degree AV block 07/30/2017  . PAD (peripheral artery disease) (HCC) 07/30/2017  . Bladder calculi 07/15/2017  . UTI (urinary tract infection) 06/13/2017  . Acute cystitis with hematuria   . Gross hematuria 06/12/2017  . Renal insufficiency 06/12/2017  . Complicated UTI (urinary tract infection) 06/12/2017  . Acute pyelonephritis 06/12/2017  . Uncomplicated alcohol dependence (HCC)   . AKI (acute kidney injury) (HCC)   . Claudication (HCC) 11/14/2016  . Encounter for loop recorder check 07/19/2015  . First degree AV block 07/19/2015  . PAT (paroxysmal atrial tachycardia) (HCC) 07/19/2015  . Essential hypertension 04/05/2015  . Cocaine  abuse ([place]) 04/05/2015  . Hospital discharge follow-up 01/10/2015  . Cerebral infarction due to embolism of right posterior cerebral artery ([place])   . Lung mass   . Acute embolic stroke ([place]) 15/03/6978  . Cerebral infarction due to unspecified mechanism   . Visual changes   . Substance abuse ([place])   . Dyslipidemia, goal LDL below 70   . History of stroke 01/03/2015  . Tobacco abuse 04/12/2014  . Bladder stones 04/12/2014   PCP:  Patient, No Pcp Per Pharmacy:   [facility]  [place], [place] - [address] [place] [address] Phone: [phone] Fax: [phone]     Social Determinants of Health (SDOH) Interventions    Readmission Risk Interventions No flowsheet data found.

## 2019-09-01 ENCOUNTER — Encounter: Payer: Self-pay | Admitting: Cardiovascular Disease

## 2019-09-01 ENCOUNTER — Other Ambulatory Visit: Payer: Self-pay

## 2019-09-01 ENCOUNTER — Ambulatory Visit (INDEPENDENT_AMBULATORY_CARE_PROVIDER_SITE_OTHER): Payer: Medicare Other | Admitting: Cardiovascular Disease

## 2019-09-01 DIAGNOSIS — I739 Peripheral vascular disease, unspecified: Secondary | ICD-10-CM

## 2019-09-01 MED ORDER — ASPIRIN EC 81 MG PO TBEC: 81.0000 mg | DELAYED_RELEASE_TABLET | Freq: Once | ORAL | 0 refills | Status: AC

## 2019-09-01 MED ORDER — ASPIRIN EC 81 MG PO TBEC: 81.0000 mg | DELAYED_RELEASE_TABLET | Freq: Every day | ORAL | 3 refills | Status: DC

## 2019-09-01 NOTE — Progress Notes (Signed)
09/01/2019 Joe Durham   [birth year]  [id number]  Primary Physician Patient, No Pcp Per Primary Cardiologist: Runell Gess MD Nicholes Calamity, [place]  HPI:  Joe Durham is a 73 y.o.  thin appearing single African-American male father of 4, grandfather and 6 grandchildren referred to me by Dr. Royann Shivers for peripheral vascular evaluation.  I last saw him in the office 07/28/2019.  He has a history of treated hypertension, hyperlipidemia and tobacco abuse. He has smoked 50 pack years. He drinks 1/2 pint of alcohol a day. 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 He's never had a heart attack but has had a stroke in the past. He is complaining of left calf claudication. His recent Dopplers performed 09/25/16 revealed ABIs in the 0.6 range bilaterally with an occluded right popliteal and tibial vessel occlusion on the left.  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  He also recently was admitted with urosepsis and was found to be in A. fib with RVR.  He complains of bilateral calf claudication especially when walking up hills.  He had lower extremity arterial Doppler studies performed 08/05/2019 revealing a right ABI of 0.46 and a left ABI 0.62 with a high-frequency signal in the distal right SFA, occluded popliteal and tibial vessels.  He has right greater than left lower extremity lifestyle limiting claudication.  He wishes to proceed with angiography and potential endovascular therapy.   Current Meds  Medication Sig  . apixaban (ELIQUIS) 5 MG TABS tablet Take 1 tablet (5 mg total) by mouth 2 (two) times daily.  Marland Kitchen atorvastatin (LIPITOR) 80 MG tablet Take 1 tablet (80 mg total) by mouth daily.  . benzonatate (TESSALON) 200 MG capsule Take 1 capsule (200 mg total) by mouth 3 (three) times daily.  . cephALEXin (KEFLEX) 500 MG capsule Take 1 capsule (500 mg total) by mouth 4 (four) times daily.  Marland Kitchen  lisinopril (ZESTRIL) 2.5 MG tablet Take 1 tablet (2.5 mg total) by mouth daily.  . mometasone-formoterol (DULERA) 100-5 MCG/ACT AERO Inhale 2 puffs into the lungs 2 (two) times daily. (Patient taking differently: Inhale 2 puffs into the lungs 2 (two) times daily as needed for wheezing. )  . pantoprazole (PROTONIX) 40 MG tablet Take 1 tablet (40 mg total) by mouth daily at 6 (six) AM.  . umeclidinium bromide (INCRUSE ELLIPTA) 62.5 MCG/INH AEPB Inhale 1 puff into the lungs daily.  . vitamin B-12 (CYANOCOBALAMIN) 100 MCG tablet Take 1 tablet (100 mcg total) by mouth daily.     Allergies  Allergen Reactions  . Lactose Intolerance (Gi) Diarrhea    Social History   Socioeconomic History  . Marital status: Single    Spouse name: Not on file  . Number of children: 4  . Years of education: 9  . Highest education level: Not on file  Occupational History  . Occupation: retired    Comment: Training and development officer  . Financial resource strain: Not on file  . Food insecurity    Worry: Not on file    Inability: Not on file  . Transportation needs    Medical: Not on file    Non-medical: Not on file  Tobacco Use  . Smoking status: Current Every Day Smoker    Packs/day: 0.25    Years: 52.00    Pack years: 13.00    Types: Cigarettes  . Smokeless tobacco: Never Used  . Tobacco comment: 07-08-2018 pt  is down to 1ppw from 1 ppd, 12-22-2018 about  half a pack per day   Substance and Sexual Activity  . Alcohol use: Yes    Alcohol/week: 0.0 standard drinks    Comment: 08/2018 ! drink 1 pint a day 3 times a week on average  . Drug use: No    Comment: history of cocaine ; last use 7 months ago   . Sexual activity: Not on file  Lifestyle  . Physical activity    Days per week: Not on file    Minutes per session: Not on file  . Stress: Not on file  Relationships  . Social Herbalist on phone: Not on file    Gets together: Not on file    Attends religious service: Not on file     Active member of club or organization: Not on file    Attends meetings of clubs or organizations: Not on file    Relationship status: Not on file  . Intimate partner violence    Fear of current or ex partner: Not on file    Emotionally abused: Not on file    Physically abused: Not on file    Forced sexual activity: Not on file  Other Topics Concern  . Not on file  Social History Narrative   Single   Right handed   Caffeine use - sodas on weekends with alcohol     Review of Systems: General: negative for chills, fever, night sweats or weight changes.  Cardiovascular: negative for chest pain, dyspnea on exertion, edema, orthopnea, palpitations, paroxysmal nocturnal dyspnea or shortness of breath Dermatological: negative for rash Respiratory: negative for cough or wheezing Urologic: negative for hematuria Abdominal: negative for nausea, vomiting, diarrhea, bright red blood per rectum, melena, or hematemesis Neurologic: negative for visual changes, syncope, or dizziness All other systems reviewed and are otherwise negative except as noted above.    Blood pressure (!) 158/79, pulse 65, height 5\' 8"  (1.727 m), weight 122 lb 12.8 oz (55.7 kg), SpO2 100 %.  General appearance: alert and no distress Neck: no adenopathy, no carotid bruit, no JVD, supple, symmetrical, trachea midline and thyroid not enlarged, symmetric, no tenderness/mass/nodules Lungs: clear to auscultation bilaterally Heart: regular rate and rhythm, S1, S2 normal, no murmur, click, rub or gallop Extremities: extremities normal, atraumatic, no cyanosis or edema Pulses: 2+ and symmetric Skin: Skin color, texture, turgor normal. No rashes or lesions Neurologic: Alert and oriented X 3, normal strength and tone. Normal symmetric reflexes. Normal coordination and gait  EKG not performed today  ASSESSMENT AND PLAN:   Claudication [facility]) History of PAD with symptomatic right greater than left lower extremity claudication and  recent Doppler studies performed 08/05/2019 revealing a right ABI of 0.46 with a high-frequency signal distal right SFA with what appears to be an occluded popliteal and tibial vessels.  He wishes to proceed with angiography potential endovascular therapy for lifestyle limiting claudication.  We will schedule this sometime in the next several weeks.      Lorretta Harp MD FACP,FACC,FAHA, [facility] 09/01/2019 4:08 PM

## 2019-09-01 NOTE — H&P (View-Only) (Signed)
   09/01/2019 Joe Durham   12/24/1945  [id number]  Primary Physician Patient, No Pcp Per Primary Cardiologist: Leven Hoel J Chazlyn Cude MD FACP, FACC, FAHA, FSCAI  HPI:  Joe Durham is a 73 y.o.  thin appearing single African-American male father of 4, grandfather and 6 grandchildren referred to me by Dr. Croitoru for peripheral vascular evaluation.  I last saw him in the office 07/28/2019.  He has a history of treated hypertension, hyperlipidemia and tobacco abuse. He has smoked 50 pack years. He drinks 1/2 pint of alcohol a day. 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 He's never had a heart attack but has had a stroke in the past. He is complaining of left calf claudication. His recent Dopplers performed 09/25/16 revealed ABIs in the 0.6 range bilaterally with an occluded right popliteal and tibial vessel occlusion on the left.  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  He also recently was admitted with urosepsis and was found to be in A. fib with RVR.  He complains of bilateral calf claudication especially when walking up hills.  He had lower extremity arterial Doppler studies performed 08/05/2019 revealing a right ABI of 0.46 and a left ABI 0.62 with a high-frequency signal in the distal right SFA, occluded popliteal and tibial vessels.  He has right greater than left lower extremity lifestyle limiting claudication.  He wishes to proceed with angiography and potential endovascular therapy.   Current Meds  Medication Sig  . apixaban (ELIQUIS) 5 MG TABS tablet Take 1 tablet (5 mg total) by mouth 2 (two) times daily.  . atorvastatin (LIPITOR) 80 MG tablet Take 1 tablet (80 mg total) by mouth daily.  . benzonatate (TESSALON) 200 MG capsule Take 1 capsule (200 mg total) by mouth 3 (three) times daily.  . cephALEXin (KEFLEX) 500 MG capsule Take 1 capsule (500 mg total) by mouth 4 (four) times daily.  .  lisinopril (ZESTRIL) 2.5 MG tablet Take 1 tablet (2.5 mg total) by mouth daily.  . mometasone-formoterol (DULERA) 100-5 MCG/ACT AERO Inhale 2 puffs into the lungs 2 (two) times daily. (Patient taking differently: Inhale 2 puffs into the lungs 2 (two) times daily as needed for wheezing. )  . pantoprazole (PROTONIX) 40 MG tablet Take 1 tablet (40 mg total) by mouth daily at 6 (six) AM.  . umeclidinium bromide (INCRUSE ELLIPTA) 62.5 MCG/INH AEPB Inhale 1 puff into the lungs daily.  . vitamin B-12 (CYANOCOBALAMIN) 100 MCG tablet Take 1 tablet (100 mcg total) by mouth daily.     Allergies  Allergen Reactions  . Lactose Intolerance (Gi) Diarrhea    Social History   Socioeconomic History  . Marital status: Single    Spouse name: Not on file  . Number of children: 4  . Years of education: 9  . Highest education level: Not on file  Occupational History  . Occupation: retired    Comment: construction  Social Needs  . Financial resource strain: Not on file  . Food insecurity    Worry: Not on file    Inability: Not on file  . Transportation needs    Medical: Not on file    Non-medical: Not on file  Tobacco Use  . Smoking status: Current Every Day Smoker    Packs/day: 0.25    Years: 52.00    Pack years: 13.00    Types: Cigarettes  . Smokeless tobacco: Never Used  . Tobacco comment: 07-08-2018 pt   is down to 1ppw from 1 ppd, 12-22-2018 about  half a pack per day   Substance and Sexual Activity  . Alcohol use: Yes    Alcohol/week: 0.0 standard drinks    Comment: 08/2018 ! drink 1 pint a day 3 times a week on average  . Drug use: No    Comment: history of cocaine ; last use 7 months ago   . Sexual activity: Not on file  Lifestyle  . Physical activity    Days per week: Not on file    Minutes per session: Not on file  . Stress: Not on file  Relationships  . Social Herbalist on phone: Not on file    Gets together: Not on file    Attends religious service: Not on file     Active member of club or organization: Not on file    Attends meetings of clubs or organizations: Not on file    Relationship status: Not on file  . Intimate partner violence    Fear of current or ex partner: Not on file    Emotionally abused: Not on file    Physically abused: Not on file    Forced sexual activity: Not on file  Other Topics Concern  . Not on file  Social History Narrative   Single   Right handed   Caffeine use - sodas on weekends with alcohol     Review of Systems: General: negative for chills, fever, night sweats or weight changes.  Cardiovascular: negative for chest pain, dyspnea on exertion, edema, orthopnea, palpitations, paroxysmal nocturnal dyspnea or shortness of breath Dermatological: negative for rash Respiratory: negative for cough or wheezing Urologic: negative for hematuria Abdominal: negative for nausea, vomiting, diarrhea, bright red blood per rectum, melena, or hematemesis Neurologic: negative for visual changes, syncope, or dizziness All other systems reviewed and are otherwise negative except as noted above.    Blood pressure (!) 158/79, pulse 65, height 5\' 8"  (1.727 m), weight 122 lb 12.8 oz (55.7 kg), SpO2 100 %.  General appearance: alert and no distress Neck: no adenopathy, no carotid bruit, no JVD, supple, symmetrical, trachea midline and thyroid not enlarged, symmetric, no tenderness/mass/nodules Lungs: clear to auscultation bilaterally Heart: regular rate and rhythm, S1, S2 normal, no murmur, click, rub or gallop Extremities: extremities normal, atraumatic, no cyanosis or edema Pulses: 2+ and symmetric Skin: Skin color, texture, turgor normal. No rashes or lesions Neurologic: Alert and oriented X 3, normal strength and tone. Normal symmetric reflexes. Normal coordination and gait  EKG not performed today  ASSESSMENT AND PLAN:   Claudication [facility]) History of PAD with symptomatic right greater than left lower extremity claudication and  recent Doppler studies performed 08/05/2019 revealing a right ABI of 0.46 with a high-frequency signal distal right SFA with what appears to be an occluded popliteal and tibial vessels.  He wishes to proceed with angiography potential endovascular therapy for lifestyle limiting claudication.  We will schedule this sometime in the next several weeks.      Lorretta Harp MD FACP,FACC,FAHA, [facility] 09/01/2019 4:08 PM

## 2019-09-01 NOTE — Progress Notes (Signed)
Thanks, Jonathan 

## 2019-09-01 NOTE — Assessment & Plan Note (Signed)
History of PAD with symptomatic right greater than left lower extremity claudication and recent Doppler studies performed 08/05/2019 revealing a right ABI of 0.46 with a high-frequency signal distal right SFA with what appears to be an occluded popliteal and tibial vessels.  He wishes to proceed with angiography potential endovascular therapy for lifestyle limiting claudication.  We will schedule this sometime in the next several weeks.

## 2019-09-01 NOTE — Patient Instructions (Addendum)
[place] CARDIOVASCULAR DIVISION [facility] [address] [place] [address] [place] [address] Dept: [phone] Loc: [phone]  Ester Mabe  09/01/2019  You are scheduled for a Peripheral Angiogram on Monday, November 9 with Dr. Nanetta Batty.  1. Please arrive at the [facility] (Main Entrance A) at [facility]: [address] [place], [place] [address] at 11:30 AM (This time is two hours before your procedure to ensure your preparation). Free valet parking service is available.   Special note: Every effort is made to have your procedure done on time. Please understand that emergencies sometimes delay scheduled procedures.  2. Diet: Do not eat solid foods after midnight.  The patient may have clear liquids until 5am upon the day of the procedure.  3. Labs:  You will need to have blood drawn on  Thursday 09/10/2019.  Go to:  [facility] at [facility] [id number], [place], [place] [address] FOR YOUR BASIC METABOLIC PANEL, COMPLETE BLOOD COUNT, AND THYROID STIMULATING HORMONE LAB WORK. NO APPOINTMENT IS NEEDED. YOU MUST HAVE THIS LAB WORK DONE BEFORE GOING TO GET YOUR COVID-19 TEST DONE BECAUSE YOU WILL NEED TO SELF-ISOLATE AFTER THE COVID-19 TEST UNTIL THE DAY OF YOUR PROCEDURE/TEST.  You will need a COVID-19 test on Thursday 09/10/2019 at 11:30 am. Go to: [facility] Entrance [address] [place], [place] [address] FOR YOUR COVID-19 TEST. YOU MUST HAVE YOUR COVID-19 TEST COMPLETED 4 DAYS PRIOR TO YOUR UPCOMING PROCEDURE/TEST. YOU WILL ALSO NEED TO SELF-ISOLATE AFTER THE COVID-19 TEST UNTIL THE DAY OF YOUR PROCEDURE/TEST. PLEASE BRING YOUR I.D. AND YOUR INSURANCE CARD(S) WITH YOU.   4. Medication instructions in preparation for your procedure:  HOLD APIXABAN (ELIQUIS) 2 DAYS PRIOR TO YOUR PROCEDURE  On the morning of your procedure, take your Aspirin 81 mg and any morning medicines NOT  listed above.  You may use sips of water.  5. Plan for one night stay--bring personal belongings. 6. Bring a current list of your medications and current insurance cards. 7. You MUST have a responsible person to drive you home. 8. Someone MUST be with you the first 24 hours after you arrive home or your discharge will be delayed. 9. Please wear clothes that are easy to get on and off and wear slip-on shoes.  ______________________________________________________________  Testing/Procedures: Your physician has requested that you have an aorta/iliac duplex. During this test, an ultrasound is used to evaluate blood flow to the aorta and iliac arteries. Allow one hour for this exam. Do not eat after midnight the day before and avoid carbonated beverages. DUE AFTER YOUR PROCEDURE. TO BE SCHEDULED  Your physician has requested that you have a lower or upper extremity arterial duplex. This test is an ultrasound of the arteries in the legs or arms. It looks at arterial blood flow in the legs and arms. Allow one hour for Lower and Upper Arterial scans. There are no restrictions or special instructions DUE AFTER YOUR PROCEDURE. TO BE SCHEDULED  Follow-Up: At [facility], you and your health needs are our priority.  As part of our continuing mission to provide you with exceptional heart care, we have created designated Provider Care Teams.  These Care Teams include your primary Cardiologist (physician) and Advanced Practice Providers (APPs -  Physician Assistants and Nurse Practitioners) who all work together to provide you with the care you need, when you need it. You will need a follow up appointment  with  Dr. Quay Burow AFTER YOUR PROCEDURE. TO BE SCHEDULED

## 2019-09-02 ENCOUNTER — Encounter (HOSPITAL_COMMUNITY): Payer: Self-pay

## 2019-09-02 ENCOUNTER — Other Ambulatory Visit: Payer: Self-pay

## 2019-09-02 ENCOUNTER — Emergency Department (HOSPITAL_COMMUNITY)
Admission: EM | Admit: 2019-09-02 | Discharge: 2019-09-02 | Disposition: A | Discharge status: Home / Self Care | Payer: Medicare Other | Attending: Emergency Medicine | Admitting: Emergency Medicine

## 2019-09-02 DIAGNOSIS — Z8673 Personal history of transient ischemic attack (TIA), and cerebral infarction without residual deficits: Secondary | ICD-10-CM | POA: Diagnosis not present

## 2019-09-02 DIAGNOSIS — I10 Essential (primary) hypertension: Secondary | ICD-10-CM | POA: Insufficient documentation

## 2019-09-02 DIAGNOSIS — T83091A Other mechanical complication of indwelling urethral catheter, initial encounter: Secondary | ICD-10-CM | POA: Diagnosis not present

## 2019-09-02 DIAGNOSIS — R339 Retention of urine, unspecified: Secondary | ICD-10-CM | POA: Diagnosis not present

## 2019-09-02 DIAGNOSIS — Z951 Presence of aortocoronary bypass graft: Secondary | ICD-10-CM | POA: Insufficient documentation

## 2019-09-02 DIAGNOSIS — J449 Chronic obstructive pulmonary disease, unspecified: Secondary | ICD-10-CM | POA: Diagnosis not present

## 2019-09-02 DIAGNOSIS — F17210 Nicotine dependence, cigarettes, uncomplicated: Secondary | ICD-10-CM | POA: Diagnosis not present

## 2019-09-02 DIAGNOSIS — Y731 Therapeutic (nonsurgical) and rehabilitative gastroenterology and urology devices associated with adverse incidents: Secondary | ICD-10-CM | POA: Insufficient documentation

## 2019-09-02 DIAGNOSIS — N390 Urinary tract infection, site not specified: Secondary | ICD-10-CM | POA: Diagnosis not present

## 2019-09-02 DIAGNOSIS — I252 Old myocardial infarction: Secondary | ICD-10-CM | POA: Insufficient documentation

## 2019-09-02 MED ORDER — SULFAMETHOXAZOLE-TRIMETHOPRIM 800-160 MG PO TABS: 1.0000 | ORAL_TABLET | Freq: Two times a day (BID) | ORAL | 0 refills | Status: AC

## 2019-09-02 NOTE — ED Provider Notes (Signed)
[facility] Emergency Department Provider Note MRN:  [id number]  Arrival date & time: 09/02/19     Chief Complaint   Urinary Retention   History of Present Illness   Joe Durham is a 73 y.o. year-old male with a history of PAD, COPD, A. fib, stroke presenting to the ED with chief complaint of urinary retention.  Lower abdominal pressure and leaking around his suprapubic catheter since this morning.  Pain is mild.  Explains that this happened last week.  Denies fever, no cough, no chest pain, shortness of breath, no upper abdominal pain, no other complaints.  Has been taking Keflex for the past 6 days.  Review of Systems  A complete 10 system review of systems was obtained and all systems are negative except as noted in the HPI and PMH.   Patient's Health History    Past Medical History:  Diagnosis Date  . Atrial fibrillation (HCC)   . Bladder calculi   . COPD (chronic obstructive pulmonary disease) (HCC)   . DOE (dyspnea on exertion)   . Elevated blood pressure reading    12-22-2018 reprots he hasnt had his BP meds since this past thursday   . Foley catheter in place   . History of bladder stone   . 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 History of urinary retention   . Hypertension   . Mobitz type 1 second degree atrioventricular block   . Myocardial infarction [facility]) 2019   non stemi  . Osteoarthritis   . PAD (peripheral artery disease) (HCC)   . Paroxysmal atrial tachycardia (HCC)   . Peripheral vascular disease (HCC)    last duplex 09-25-2016 bilateral ABI 0.6 and occulsion of  right popiteal posterior tibials and left peroneal  . Presence of suprapubic catheter (HCC)   . Stroke (HCC)   . Wears glasses     Past Surgical History:  Procedure Laterality Date  . CORONARY ARTERY BYPASS GRAFT N/A 09/05/2018   Procedure: CORONARY  ARTERY BYPASS GRAFTING (CABG) times 3 using left internal mammary artery and right greater saphenous vein harvested endoscopically.;  Surgeon: Kerin Perna, MD;  Location: [facility] OR;  Service: Open Heart Surgery;  Laterality: N/A;  . CYSTOSCOPY WITH LITHOLAPAXY N/A 07/15/2017   Procedure: OPEN CYSTOLITHOTOMY;  Surgeon: Crista Elliot, MD;  Location: [facility] ORS;  Service: Urology;  Laterality: N/A;  . INGUINAL HERNIA REPAIR Right 2011  . INSERTION OF SUPRAPUBIC CATHETER N/A 07/15/2017   Procedure: INSERTION OF SUPRAPUBIC CATHETER;  Surgeon: Crista Elliot, MD;  Location: [facility] ORS;  Service: Urology;  Laterality: N/A;  . LEFT HEART CATH AND CORONARY ANGIOGRAPHY N/A 09/03/2018   Procedure: LEFT HEART CATH AND CORONARY ANGIOGRAPHY;  Surgeon: Iran Ouch, MD;  Location: [facility] INVASIVE CV LAB;  Service: Cardiovascular;  Laterality: N/A;  . LOOP RECORDER IMPLANT N/A 01/05/2015   Procedure: LOOP RECORDER IMPLANT;  Surgeon: Thurmon Fair, MD;  Location: [facility] CATH LAB;  Service: Cardiovascular;  Laterality: N/A;  . ORIF WRIST FRACTURE Left 03/20/2014   Procedure: OPEN REDUCTION INTERNAL FIXATION (ORIF) WRIST FRACTURE;  Surgeon: Dominica Severin, MD;  Location: [facility] OR;  Service: Orthopedics;  Laterality: Left;  . TEE WITHOUT CARDIOVERSION N/A 01/05/2015   Procedure: TRANSESOPHAGEAL ECHOCARDIOGRAM (TEE)/LOOP;  Surgeon: Thurmon Fair, MD;  Location: [facility] ENDOSCOPY;  Service: Cardiovascular;  Laterality: N/A; ef 60-65%, no LA or RA thrombus, trivial PR,  mild TR  . TEE WITHOUT CARDIOVERSION N/A 09/05/2018   Procedure: TRANSESOPHAGEAL ECHOCARDIOGRAM (TEE);  Surgeon: Prescott Gum, Collier Salina, MD;  Location: [place];  Service: Open Heart Surgery;  Laterality: N/A;  . TONSILLECTOMY  child  . TOTAL HIP ARTHROPLASTY Right 2000    Family History  Problem Relation Age of Onset  . Stroke Mother   . Stroke Sister     Social History   Socioeconomic History  . Marital status: Single    Spouse name: Not on file  . Number of  children: 4  . Years of education: 9  . Highest education level: Not on file  Occupational History  . Occupation: retired    Comment: Barista  . Financial resource strain: Not on file  . Food insecurity    Worry: Not on file    Inability: Not on file  . Transportation needs    Medical: Not on file    Non-medical: Not on file  Tobacco Use  . Smoking status: Current Every Day Smoker    Packs/day: 0.25    Years: 52.00    Pack years: 13.00    Types: Cigarettes  . Smokeless tobacco: Never Used  . Tobacco comment: 07-08-2018 pt is down to 1ppw from 1 ppd, 12-22-2018 about  half a pack per day   Substance and Sexual Activity  . Alcohol use: Yes    Alcohol/week: 0.0 standard drinks    Comment: 08/2018 ! drink 1 pint a day 3 times a week on average  . Drug use: No    Comment: history of cocaine ; last use 7 months ago   . Sexual activity: Not on file  Lifestyle  . Physical activity    Days per week: Not on file    Minutes per session: Not on file  . Stress: Not on file  Relationships  . Social Herbalist on phone: Not on file    Gets together: Not on file    Attends religious service: Not on file    Active member of club or organization: Not on file    Attends meetings of clubs or organizations: Not on file    Relationship status: Not on file  . Intimate partner violence    Fear of current or ex partner: Not on file    Emotionally abused: Not on file    Physically abused: Not on file    Forced sexual activity: Not on file  Other Topics Concern  . Not on file  Social History Narrative   Single   Right handed   Caffeine use - sodas on weekends with alcohol     Physical Exam  Vital Signs and Nursing Notes reviewed Vitals:   09/02/19 1145  BP: (!) 200/93  Pulse: (!) 49  Resp: 16  Temp: 97.6 F (36.4 C)  SpO2: 100%    CONSTITUTIONAL: Chronically ill-appearing, NAD NEURO:  Alert and oriented x 3, no focal deficits EYES:  eyes equal and  reactive ENT/NECK:  no LAD, no JVD CARDIO: Bradycardic rate, well-perfused, normal S1 and S2 PULM:  CTAB no wheezing or rhonchi GI/GU:  normal bowel sounds, non-distended, non-tender, suprapubic catheter present, no active leaking MSK/SPINE:  No gross deformities, no edema SKIN:  no rash, atraumatic PSYCH:  Appropriate speech and behavior  Diagnostic and Interventional Summary    EKG Interpretation  Date/Time:    Ventricular Rate:    PR Interval:    QRS Duration:   QT Interval:  QTC Calculation:   R Axis:     Text Interpretation:        Labs Reviewed  URINE CULTURE    No orders to display    Medications - No data to display   Procedures  /  Critical Care Procedures  ED Course and Medical Decision Making  I have reviewed the triage vital signs and the nursing notes.  Pertinent labs & imaging results that were available during my care of the patient were reviewed by me and considered in my medical decision making (see below for details).  Catheter flushed by nursing with resolution of retention, flow of 300-400 cc of urine.  Benign abdomen.  Vital signs nonconcerning.  Patient will be provided with education on flushing of catheter.  Per chart review, patient had a UTI growing Proteus species back in July that was resistant to cephalosporins.  It is possible that he has continued infection causing persistent sedimentation in the urine.  We will switch him to Bactrim and send current urine sample for culture.  Appropriate for discharge with urology follow-up.   Elmer Sow. Pilar Plate, MD [facility] Emergency Medicine [facility] [email or web address]  Final Clinical Impressions(s) / ED Diagnoses     ICD-10-CM   1. Urinary retention  R33.9   2. Obstruction of Foley catheter, initial encounter (HCC)  T83.091A   3. Lower urinary tract infectious disease  N39.0     ED Discharge Orders         Ordered    sulfamethoxazole-trimethoprim (BACTRIM DS) 800-160 MG  tablet  2 times daily     09/02/19 1213          Discharge Instructions Discussed with and Provided to Patient:   Discharge Instructions     You were evaluated in the Emergency Department and after careful evaluation, we did not find any emergent condition requiring admission or further testing in the hospital.  Your exam/testing today is overall reassuring.  Please stop taking the Keflex medication and start taking the new Bactrim antibiotic.  Please return to the Emergency Department if you experience any worsening of your condition.  We encourage you to follow up with a primary care provider.  Thank you for allowing [place] to be a part of your care.       Sabas Sous, MD 09/02/19 1214

## 2019-09-02 NOTE — Discharge Instructions (Addendum)
You were evaluated in the Emergency Department and after careful evaluation, we did not find any emergent condition requiring admission or further testing in the hospital.  Your exam/testing today is overall reassuring.  Please stop taking the Keflex medication and start taking the new Bactrim antibiotic.  Please return to the Emergency Department if you experience any worsening of your condition.  We encourage you to follow up with a primary care provider.  Thank you for allowing [place] to be a part of your care.

## 2019-09-02 NOTE — ED Triage Notes (Signed)
He tells me his chronic suprapubic catheter "started leaking". He is in no distress. He has been here recently for the same, and his catheter also was recently replaced.

## 2019-09-04 LAB — URINE CULTURE: Culture: >=100000 — AB

## 2019-09-05 ENCOUNTER — Telehealth: Payer: Self-pay

## 2019-09-05 NOTE — Telephone Encounter (Signed)
Post ED Visit - Positive Culture Follow-up  Culture report reviewed by antimicrobial stewardship pharmacist: [place] Team []  Nathan Batchelder, Pharm.D. []  [address], Pharm.D., BCPS AQ-ID []  Heide Guile, Pharm.D., BCPS []  Parks Neptune, Pharm.D., BCPS []  [place], Pharm.D., BCPS, AAHIVP []  [place], Pharm.D., BCPS, AAHIVP []  Legrand Como, PharmD, BCPS []  Salome Arnt, PharmD, BCPS []  Johnnette Gourd, PharmD, BCPS []  Hughes Better, PharmD []  Leeroy Cha, PharmD, BCPS []  Laqueta Linden, PharmD  [place] Team []  [address], PharmD []  Leodis Sias, PharmD []  Lindell Spar, PharmD []  Royetta Asal, Rph []  Graylin Shiver) Rema Fendt, PharmD []  Glennon Mac, PharmD []  Arlyn Dunning, PharmD []  Netta Cedars, PharmD []  Dia Sitter, PharmD []  Leone Haven, PharmD []  Gretta Arab, PharmD []  Theodis Shove, PharmD [x]  Peggyann Juba, PharmD   Positive urine culture Treated with Bactrim DS, organism sensitive to the same and no further patient follow-up is required at this time.  Reuel Boom 09/05/2019, 1:11 PM

## 2019-09-06 NOTE — Progress Notes (Signed)
Patient ID: Joe Durham, male   DOB: 01/31/1946, 73 y.o.   MRN: [id number] Virtual Visit via Telephone Note  I connected with Joe Durham on 09/09/19 at  9:30 AM EST by telephone and verified that I am speaking with the correct person using two identifiers.   I discussed the limitations, risks, security and privacy concerns of performing an evaluation and management service by telephone and the availability of in person appointments. I also discussed with the patient that there may be a patient responsible charge related to this service. The patient expressed understanding and agreed to proceed.  Patient location:  Home  My Location:  [place] office Persons on the call: me and the patient   History of Present Illness: After ED visit 09/02/2019 for UTI.  Patient finished his antibiotics.  He is awaiting hernia repair.  No fever.  No abdominal pain.    From d/c summary: Catheter flushed by nursing with resolution of retention, flow of 300-400 cc of urine.  Benign abdomen.  Vital signs nonconcerning.  Patient will be provided with education on flushing of catheter.  Per chart review, patient had a UTI growing Proteus species back in July that was resistant to cephalosporins.  It is possible that he has continued infection causing persistent sedimentation in the urine.  We will switch him to Bactrim and send current urine sample for culture.  Appropriate for discharge with urology follow-up.    Observations/Objective:  NAD.  Speech is slow.     Assessment and Plan: 1. Incarcerated left inguinal hernia Surgery scheduled  2. Urinary tract infection with hematuria, site unspecified Has proper/taking/just completed antibiotics  3. Hospital discharge follow-up No new problems today  4. Indwelling catheter present on admission F/up urology as planned-tentatively to be removed after surgery.     Follow Up Instructions: Assign PCP in ~ 6 weeks  I discussed the assessment and treatment  plan with the patient. The patient was provided an opportunity to ask questions and all were answered. The patient agreed with the plan and demonstrated an understanding of the instructions.   The patient was advised to call back or seek an in-person evaluation if the symptoms worsen or if the condition fails to improve as anticipated.  I provided 12  minutes of non-face-to-face time during this encounter.   Freeman Caldron, PA-C

## 2019-09-07 ENCOUNTER — Inpatient Hospital Stay (HOSPITAL_COMMUNITY): Admission: RE | Admit: 2019-09-07 | Payer: Medicare Other | Source: Ambulatory Visit

## 2019-09-09 ENCOUNTER — Ambulatory Visit: Payer: Medicare Other | Attending: Family Medicine | Admitting: Physician Assistant

## 2019-09-09 ENCOUNTER — Other Ambulatory Visit: Payer: Self-pay

## 2019-09-09 DIAGNOSIS — N390 Urinary tract infection, site not specified: Secondary | ICD-10-CM

## 2019-09-09 DIAGNOSIS — Z960 Presence of urogenital implants: Secondary | ICD-10-CM

## 2019-09-09 DIAGNOSIS — K4030 Unilateral inguinal hernia, with obstruction, without gangrene, not specified as recurrent: Secondary | ICD-10-CM | POA: Diagnosis not present

## 2019-09-09 DIAGNOSIS — Z09 Encounter for follow-up examination after completed treatment for conditions other than malignant neoplasm: Secondary | ICD-10-CM

## 2019-09-09 DIAGNOSIS — R319 Hematuria, unspecified: Secondary | ICD-10-CM

## 2019-09-10 ENCOUNTER — Telehealth: Payer: Self-pay | Admitting: *Deleted

## 2019-09-10 NOTE — Telephone Encounter (Addendum)
Pt contacted pre-abdominal aortogram  scheduled at [facility] for: Monday September 14, 2019 1:30 PM Verified arrival time and place: [facility]) at: 11:30 AM   No solid food after midnight prior to cath, clear liquids until 5 AM day of procedure. Contrast allergy: no  Hold: Eliquis-none 09/12/19 until post procedure.  Except hold medications AM meds can be  taken pre-cath with sip of water including: ASA 81 mg   Confirmed patient has responsible adult to drive home post procedure and observe 24 hours after arriving home: yes  Currently, due to Covid-19 pandemic, only one support person will be allowed with patient. Must be the same support person for that patient's entire stay, will be screened and required to wear a mask. They will be asked to wait in the waiting room for the duration of the patient's stay.  Patients are required to wear a mask when they enter the hospital.      COVID-19 Pre-Screening Questions:  . In the past 7 to 10 days have you had a cough,  shortness of breath, headache, congestion, fever (100 or greater) body aches, chills, sore throat, or sudden loss of taste or sense of smell? no . Have you been around anyone with known Covid 19? no . Have you been around anyone who is awaiting Covid 19 test results in the past 7 to 10 days? no . Have you been around anyone who has been exposed to Covid 19, or has mentioned symptoms of Covid 19 within the past 7 to 10 days? no  I spoke with patient, he asked me to talk with friend, Rhona Raider. I reviewed procedure/mask/visitor instructions with friend (DPR), Rhona Raider, she verbalized understanding, thanked me for call.

## 2019-09-11 ENCOUNTER — Other Ambulatory Visit (HOSPITAL_COMMUNITY)
Admission: RE | Admit: 2019-09-11 | Discharge: 2019-09-11 | Disposition: A | Discharge status: Home / Self Care | Payer: Medicare Other | Source: Ambulatory Visit | Attending: Cardiovascular Disease | Admitting: Cardiovascular Disease

## 2019-09-11 DIAGNOSIS — Z01812 Encounter for preprocedural laboratory examination: Secondary | ICD-10-CM | POA: Insufficient documentation

## 2019-09-11 DIAGNOSIS — Z20828 Contact with and (suspected) exposure to other viral communicable diseases: Secondary | ICD-10-CM | POA: Diagnosis not present

## 2019-09-11 LAB — CBC
Hematocrit: 42.6 % (ref 37.5–51.0)
Hemoglobin: 13.9 g/dL (ref 13.0–17.7)
MCH: 34.8 pg — ABNORMAL HIGH (ref 26.6–33.0)
MCHC: 32.6 g/dL (ref 31.5–35.7)
MCV: 107 fL — ABNORMAL HIGH (ref 79–97)
Platelets: 245 10*3/uL (ref 150–450)
RBC: 3.99 x10E6/uL — ABNORMAL LOW (ref 4.14–5.80)
RDW: 12.1 % (ref 11.6–15.4)
WBC: 5.7 10*3/uL (ref 3.4–10.8)

## 2019-09-11 LAB — BASIC METABOLIC PANEL
BUN/Creatinine Ratio: 8 — ABNORMAL LOW (ref 10–24)
BUN: 9 mg/dL (ref 8–27)
CO2: 25 mmol/L (ref 20–29)
Calcium: 9.9 mg/dL (ref 8.6–10.2)
Chloride: 104 mmol/L (ref 96–106)
Creatinine, Ser: 1.06 mg/dL (ref 0.76–1.27)
GFR calc Af Amer: 81 mL/min/{1.73_m2} (ref >59)
GFR calc non Af Amer: 70 mL/min/{1.73_m2} (ref >59)
Glucose: 90 mg/dL (ref 65–99)
Potassium: 4.8 mmol/L (ref 3.5–5.2)
Sodium: 144 mmol/L (ref 134–144)

## 2019-09-11 LAB — SARS CORONAVIRUS 2 (TAT 6-24 HRS): SARS Coronavirus 2: NEGATIVE

## 2019-09-14 ENCOUNTER — Other Ambulatory Visit: Payer: Self-pay

## 2019-09-14 ENCOUNTER — Encounter (HOSPITAL_COMMUNITY)
Admission: RE | Disposition: A | Discharge status: Home / Self Care | Payer: Medicare Other | Source: Home / Self Care | Attending: Cardiovascular Disease

## 2019-09-14 ENCOUNTER — Ambulatory Visit (HOSPITAL_COMMUNITY)
Admission: RE | Admit: 2019-09-14 | Discharge: 2019-09-15 | Disposition: A | Discharge status: Home / Self Care | Payer: Medicare Other | Attending: Cardiovascular Disease | Admitting: Cardiovascular Disease

## 2019-09-14 DIAGNOSIS — Z720 Tobacco use: Secondary | ICD-10-CM | POA: Diagnosis present

## 2019-09-14 DIAGNOSIS — F17210 Nicotine dependence, cigarettes, uncomplicated: Secondary | ICD-10-CM | POA: Diagnosis not present

## 2019-09-14 DIAGNOSIS — I480 Paroxysmal atrial fibrillation: Secondary | ICD-10-CM | POA: Insufficient documentation

## 2019-09-14 DIAGNOSIS — Z79899 Other long term (current) drug therapy: Secondary | ICD-10-CM | POA: Insufficient documentation

## 2019-09-14 DIAGNOSIS — I739 Peripheral vascular disease, unspecified: Secondary | ICD-10-CM | POA: Diagnosis not present

## 2019-09-14 DIAGNOSIS — I2510 Atherosclerotic heart disease of native coronary artery without angina pectoris: Secondary | ICD-10-CM | POA: Diagnosis not present

## 2019-09-14 DIAGNOSIS — E785 Hyperlipidemia, unspecified: Secondary | ICD-10-CM | POA: Diagnosis not present

## 2019-09-14 DIAGNOSIS — Z7901 Long term (current) use of anticoagulants: Secondary | ICD-10-CM | POA: Diagnosis not present

## 2019-09-14 DIAGNOSIS — Z951 Presence of aortocoronary bypass graft: Secondary | ICD-10-CM

## 2019-09-14 DIAGNOSIS — Z8673 Personal history of transient ischemic attack (TIA), and cerebral infarction without residual deficits: Secondary | ICD-10-CM | POA: Diagnosis not present

## 2019-09-14 DIAGNOSIS — I70213 Atherosclerosis of native arteries of extremities with intermittent claudication, bilateral legs: Secondary | ICD-10-CM | POA: Diagnosis not present

## 2019-09-14 DIAGNOSIS — I10 Essential (primary) hypertension: Secondary | ICD-10-CM | POA: Diagnosis not present

## 2019-09-14 HISTORY — PX: ABDOMINAL AORTOGRAM W/LOWER EXTREMITY: CATH118223

## 2019-09-14 SURGERY — ABDOMINAL AORTOGRAM W/LOWER EXTREMITY
Anesthesia: LOCAL | Laterality: Bilateral

## 2019-09-14 MED ORDER — FENTANYL CITRATE (PF) 100 MCG/2ML IJ SOLN
INTRAMUSCULAR | Status: AC
  Filled 2019-09-14: qty 2

## 2019-09-14 MED ORDER — HEPARIN (PORCINE) IN NACL 1000-0.9 UT/500ML-% IV SOLN
INTRAVENOUS | Status: AC
  Filled 2019-09-14: qty 1000

## 2019-09-14 MED ORDER — MIDAZOLAM HCL 2 MG/2ML IJ SOLN
INTRAMUSCULAR | Status: AC
  Filled 2019-09-14: qty 2

## 2019-09-14 MED ORDER — ASPIRIN 81 MG PO CHEW
81.0000 mg | CHEWABLE_TABLET | ORAL | Status: AC
  Administered 2019-09-14: 12:00:00 81 mg via ORAL

## 2019-09-14 MED ORDER — ATORVASTATIN CALCIUM 80 MG PO TABS
80.0000 mg | ORAL_TABLET | Freq: Every day | ORAL | Status: DC
  Administered 2019-09-14 – 2019-09-15 (×2): 80 mg via ORAL
  Filled 2019-09-14 (×2): qty 1

## 2019-09-14 MED ORDER — MIDAZOLAM HCL 2 MG/2ML IJ SOLN
INTRAMUSCULAR | Status: DC | PRN
  Administered 2019-09-14: 1 mg via INTRAVENOUS

## 2019-09-14 MED ORDER — SODIUM CHLORIDE 0.9 % IV SOLN: 250.0000 mL | INTRAVENOUS | Status: DC | PRN

## 2019-09-14 MED ORDER — SODIUM CHLORIDE 0.9% FLUSH
3.0000 mL | Freq: Two times a day (BID) | INTRAVENOUS | Status: DC
  Administered 2019-09-15: 08:00:00 3 mL via INTRAVENOUS

## 2019-09-14 MED ORDER — ASPIRIN 81 MG PO CHEW
CHEWABLE_TABLET | ORAL | Status: AC
  Administered 2019-09-14: 12:00:00 81 mg via ORAL
  Filled 2019-09-14: qty 1

## 2019-09-14 MED ORDER — HYDRALAZINE HCL 20 MG/ML IJ SOLN
5.0000 mg | INTRAMUSCULAR | Status: DC | PRN
  Administered 2019-09-15: 05:00:00 5 mg via INTRAVENOUS
  Filled 2019-09-14: qty 1

## 2019-09-14 MED ORDER — LABETALOL HCL 5 MG/ML IV SOLN: 10.0000 mg | INTRAVENOUS | Status: DC | PRN

## 2019-09-14 MED ORDER — ONDANSETRON HCL 4 MG/2ML IJ SOLN: 4.0000 mg | Freq: Four times a day (QID) | INTRAMUSCULAR | Status: DC | PRN

## 2019-09-14 MED ORDER — SODIUM CHLORIDE 0.9 % WEIGHT BASED INFUSION: 1.0000 mL/kg/h | INTRAVENOUS | Status: DC

## 2019-09-14 MED ORDER — LIDOCAINE HCL (PF) 1 % IJ SOLN
INTRAMUSCULAR | Status: DC | PRN
  Administered 2019-09-14: 29 mL

## 2019-09-14 MED ORDER — HEPARIN (PORCINE) IN NACL 1000-0.9 UT/500ML-% IV SOLN
INTRAVENOUS | Status: DC | PRN
  Administered 2019-09-14 (×2): 500 mL

## 2019-09-14 MED ORDER — MORPHINE SULFATE (PF) 2 MG/ML IV SOLN: 2.0000 mg | INTRAVENOUS | Status: DC | PRN

## 2019-09-14 MED ORDER — SODIUM CHLORIDE 0.9 % WEIGHT BASED INFUSION
3.0000 mL/kg/h | INTRAVENOUS | Status: DC
  Administered 2019-09-14: 12:00:00 3 mL/kg/h via INTRAVENOUS

## 2019-09-14 MED ORDER — LIDOCAINE HCL (PF) 1 % IJ SOLN
INTRAMUSCULAR | Status: AC
  Filled 2019-09-14: qty 30

## 2019-09-14 MED ORDER — IODIXANOL 320 MG/ML IV SOLN
INTRAVENOUS | Status: DC | PRN
  Administered 2019-09-14: 112 mL via INTRA_ARTERIAL

## 2019-09-14 MED ORDER — ACETAMINOPHEN 325 MG PO TABS
650.0000 mg | ORAL_TABLET | ORAL | Status: DC | PRN
  Administered 2019-09-15: 07:00:00 650 mg via ORAL
  Filled 2019-09-14: qty 2

## 2019-09-14 MED ORDER — ASPIRIN EC 81 MG PO TBEC
81.0000 mg | DELAYED_RELEASE_TABLET | Freq: Every day | ORAL | Status: DC
  Administered 2019-09-15: 08:00:00 81 mg via ORAL
  Filled 2019-09-14: qty 1

## 2019-09-14 MED ORDER — HYDRALAZINE HCL 20 MG/ML IJ SOLN
INTRAMUSCULAR | Status: DC | PRN
  Administered 2019-09-14 (×2): 10 mg via INTRAVENOUS

## 2019-09-14 MED ORDER — FENTANYL CITRATE (PF) 100 MCG/2ML IJ SOLN
INTRAMUSCULAR | Status: DC | PRN
  Administered 2019-09-14: 25 ug via INTRAVENOUS

## 2019-09-14 MED ORDER — SODIUM CHLORIDE 0.9 % IV SOLN
INTRAVENOUS | Status: AC
  Administered 2019-09-15: 01:00:00 via INTRAVENOUS

## 2019-09-14 MED ORDER — PANTOPRAZOLE SODIUM 40 MG PO TBEC
40.0000 mg | DELAYED_RELEASE_TABLET | Freq: Every day | ORAL | Status: DC
  Administered 2019-09-15: 05:00:00 40 mg via ORAL
  Filled 2019-09-14: qty 1

## 2019-09-14 MED ORDER — SODIUM CHLORIDE 0.9% FLUSH: 3.0000 mL | INTRAVENOUS | Status: DC | PRN

## 2019-09-14 MED ORDER — LISINOPRIL 2.5 MG PO TABS
2.5000 mg | ORAL_TABLET | Freq: Every day | ORAL | Status: DC
  Administered 2019-09-15: 08:00:00 2.5 mg via ORAL
  Filled 2019-09-14: qty 1

## 2019-09-14 MED ORDER — HYDRALAZINE HCL 20 MG/ML IJ SOLN
INTRAMUSCULAR | Status: AC
  Filled 2019-09-14: qty 1

## 2019-09-14 MED ORDER — SODIUM CHLORIDE 0.9% FLUSH
3.0000 mL | Freq: Two times a day (BID) | INTRAVENOUS | Status: DC
  Administered 2019-09-14 – 2019-09-15 (×2): 3 mL via INTRAVENOUS

## 2019-09-14 SURGICAL SUPPLY — 11 items
CATH ANGIO 5F PIGTAIL 65CM (CATHETERS) ×1 IMPLANT
CLOSURE MYNX CONTROL 5F (Vascular Products) ×1 IMPLANT
KIT PV (KITS) ×2 IMPLANT
SHEATH PINNACLE 5F 10CM (SHEATH) ×1 IMPLANT
SHEATH PROBE COVER 6X72 (BAG) ×1 IMPLANT
STOPCOCK MORSE 400PSI 3WAY (MISCELLANEOUS) ×2 IMPLANT
SYR MEDRAD MARK 7 150ML (SYRINGE) ×2 IMPLANT
TRANSDUCER W/STOPCOCK (MISCELLANEOUS) ×2 IMPLANT
TRAY PV CATH (CUSTOM PROCEDURE TRAY) ×2 IMPLANT
TUBING CIL FLEX 10 FLL-RA (TUBING) ×1 IMPLANT
WIRE HITORQ VERSACORE ST 145CM (WIRE) ×2 IMPLANT

## 2019-09-14 NOTE — Interval H&P Note (Signed)
History and Physical Interval Note:  09/14/2019 5:17 PM  Joe Durham  has presented today for surgery, with the diagnosis of pad.  The various methods of treatment have been discussed with the patient and family. After consideration of risks, benefits and other options for treatment, the patient has consented to  Procedure(s): ABDOMINAL AORTOGRAM W/LOWER EXTREMITY (N/A) as a surgical intervention.  The patient's history has been reviewed, patient examined, no change in status, stable for surgery.  I have reviewed the patient's chart and labs.  Questions were answered to the patient's satisfaction.     Quay Burow

## 2019-09-15 ENCOUNTER — Encounter (HOSPITAL_COMMUNITY): Payer: Self-pay | Admitting: Cardiovascular Disease

## 2019-09-15 ENCOUNTER — Ambulatory Visit: Payer: Medicare Other | Admitting: Cardiovascular Disease

## 2019-09-15 DIAGNOSIS — I739 Peripheral vascular disease, unspecified: Secondary | ICD-10-CM

## 2019-09-15 DIAGNOSIS — Z951 Presence of aortocoronary bypass graft: Secondary | ICD-10-CM | POA: Diagnosis not present

## 2019-09-15 DIAGNOSIS — I10 Essential (primary) hypertension: Secondary | ICD-10-CM | POA: Diagnosis not present

## 2019-09-15 DIAGNOSIS — I2510 Atherosclerotic heart disease of native coronary artery without angina pectoris: Secondary | ICD-10-CM | POA: Diagnosis not present

## 2019-09-15 DIAGNOSIS — Z720 Tobacco use: Secondary | ICD-10-CM | POA: Diagnosis not present

## 2019-09-15 LAB — CBC
HCT: 37.7 % — ABNORMAL LOW (ref 39.0–52.0)
Hemoglobin: 12.8 g/dL — ABNORMAL LOW (ref 13.0–17.0)
MCH: 35.1 pg — ABNORMAL HIGH (ref 26.0–34.0)
MCHC: 34 g/dL (ref 30.0–36.0)
MCV: 103.3 fL — ABNORMAL HIGH (ref 80.0–100.0)
Platelets: 184 10*3/uL (ref 150–400)
RBC: 3.65 MIL/uL — ABNORMAL LOW (ref 4.22–5.81)
RDW: 13.5 % (ref 11.5–15.5)
WBC: 4.8 10*3/uL (ref 4.0–10.5)
nRBC: 0 % (ref 0.0–0.2)

## 2019-09-15 LAB — BASIC METABOLIC PANEL
Anion gap: 9 (ref 5–15)
BUN: 13 mg/dL (ref 8–23)
CO2: 22 mmol/L (ref 22–32)
Calcium: 9 mg/dL (ref 8.9–10.3)
Chloride: 105 mmol/L (ref 98–111)
Creatinine, Ser: 1.07 mg/dL (ref 0.61–1.24)
GFR calc Af Amer: >60 mL/min (ref >60)
GFR calc non Af Amer: >60 mL/min (ref >60)
Glucose, Bld: 107 mg/dL — ABNORMAL HIGH (ref 70–99)
Potassium: 3.6 mmol/L (ref 3.5–5.1)
Sodium: 136 mmol/L (ref 135–145)

## 2019-09-15 MED ORDER — ACETAMINOPHEN 325 MG PO TABS: 650.0000 mg | ORAL_TABLET | ORAL | Status: DC | PRN

## 2019-09-15 MED ORDER — CILOSTAZOL 50 MG PO TABS: 50.0000 mg | ORAL_TABLET | Freq: Two times a day (BID) | ORAL | 6 refills | Status: AC

## 2019-09-15 MED ORDER — APIXABAN 5 MG PO TABS
5.0000 mg | ORAL_TABLET | Freq: Two times a day (BID) | ORAL | Status: DC
  Administered 2019-09-15: 10:00:00 5 mg via ORAL
  Filled 2019-09-15: qty 1

## 2019-09-15 NOTE — Discharge Summary (Signed)
Discharge Summary    Patient ID: Joe Durham MRN: [id number]; DOB: Oct 31, 1946  Admit date: 09/14/2019 Discharge date: 09/15/2019  Primary Care Provider: Patient, No Pcp Per  Primary Cardiologist: Sanda Klein, MD  Primary Electrophysiologist:  None  PV Dr. 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  Ultrasound-guided left common femoral access               2.  Abdominal aortogram               3.  Bilateral iliac angiogram               4.  Bifemoral runoff               5.  Left common femoral angiogram followed by Mynx closure  PROCEDURE DESCRIPTION:   The patient was brought to the second floor [place] [facility] cath lab in the the postabsorptive state. He was premedicated with IV Versed and fentanyl. His left groin was prepped and shaved in usual sterile fashion. Xylocaine 1% was used for local anesthesia. A 5 French sheath was inserted into the left common femoral artery using standard Seldinger technique.  The vessel was visualized using ultrasound and access was obtained.  A digital copy of the ultrasound image was captured and placed in the chart.  A 5 French pigtail catheters placed in the mid abdominal aorta.  Abdominal aortography, bilateral iliac angiography with bifemoral runoff was performed using bolus chase, digital subtraction and step table technique.  Omnipaque dye was used for the entirety of the case.  Retrograde aorta, ventricular and pullback pressures were recorded.    Angiographic Data:   1: Abdominal aorta-the renal arteries widely patent.  The infrarenal abdominal aorta was free of atherosclerotic changes 2: Left lower extremity-there was a 90% eccentric mid left SFA stenosis.  Left popliteal was  occluded with 0 vessel runoff 3: Right lower extremity-the distal right SFA was occluded as was the popliteal and the proximal tibial vessels with 0 vessel runoff  IMPRESSION: Joe Durham has occluded popliteal and tibial vessels with 0 vessel runoff bilaterally.  There are no endovascular options.  He is claudicant and does not have critical limb ischemia.  Medical therapy will be recommended.  A left common femoral angiogram was performed and a MYNX closure device was successfully deployed achieving hemostasis.  The patient left lab in stable condition.  He will be gently hydrated overnight and discharged home the morning.  He will be seen back in the office in 2 to 3 weeks for follow-up.   Joe Durham. MD, [facility] _____________   History of Present Illness     Joe Durham is a 73 y.o. male with  hx of CAD, CABG, PAF, RVR,  HTN, HLD, tobacco use, Mobitz I,  ETOH use, CVA, with Lt calf claudication and ABIs at 0.6 bil. with occluded right popliteal and tibial vessel occlusion on the left presented for elective PV angiogram to further eval PV disease.    Pt did well with procedure and plan for medical therapy.  He was kept overnight for hydration.    Hospital Course     Consultants: nonw  Today he is stable and without complaints,  He has mobitz I on tele but rate is good except in 40s at night and asymptomatic.   We cancelled his follow up dopplers since no procedure was done.  He will follow up with Dr. Erlene Quan and Dr. Judie Petit. Croitoru  He has been seen and evaluated by Dr. Cristal Deer and found stable for discharge.  His eliquis was resumed and for medical therapy of PAD Pletal was added.   Cath site without hematoma.  Did the patient have an acute coronary syndrome (MI, NSTEMI, STEMI, etc) this admission?:  No                               Did the patient have a percutaneous coronary intervention (stent / angioplasty)?:  No.   _____________  Discharge Vitals Blood pressure (!)  134/94, pulse 64, temperature 98 F (36.7 C), temperature source Oral, resp. rate (!) 25, height 5\' 8"  (1.727 m), weight 53 kg, SpO2 97 %.  Filed Weights   09/14/19 1117 09/14/19 1958 09/15/19 0433  Weight: 54.4 kg 53.9 kg 53 kg    Labs & Radiologic Studies    CBC Recent Labs    09/15/19 0331  WBC 4.8  HGB 12.8*  HCT 37.7*  MCV 103.3*  PLT 184   Basic Metabolic Panel Recent Labs    07/86/75 0331  NA 136  K 3.6  CL 105  CO2 22  GLUCOSE 107*  BUN 13  CREATININE 1.07  CALCIUM 9.0   Liver Function Tests No results for input(s): AST, ALT, ALKPHOS, BILITOT, PROT, ALBUMIN in the last 72 hours. No results for input(s): LIPASE, AMYLASE in the last 72 hours. High Sensitivity Troponin:   No results for input(s): TROPONINIHS in the last 720 hours.  BNP Invalid input(s): POCBNP D-Dimer No results for input(s): DDIMER in the last 72 hours. Hemoglobin A1C No results for input(s): HGBA1C in the last 72 hours. Fasting Lipid Panel No results for input(s): CHOL, HDL, LDLCALC, TRIG, CHOLHDL, LDLDIRECT in the last 72 hours. Thyroid Function Tests No results for input(s): TSH, T4TOTAL, T3FREE, THYROIDAB in the last 72 hours.  Invalid input(s): FREET3 _____________  No results found. Disposition   Pt is being discharged home today in good condition.  Follow-up Plans & Appointments   Call [facility] at [phone] if any bleeding, swelling or drainage at cath site.  May shower, no tub baths for 48 hours for groin sticks. No lifting over 5 pounds for 3 days.  No Driving for 3 days.  Call if pain or swelling at the site.   Heart healthy diet   Stop smoking very important.and if you drink alcohol decrease to no more than 1 drink per day. If that   Follow-up Information    Runell Gess, MD Follow up on 09/29/2019.   Specialties: Cardiology, Radiology Why: at 1130 AM Contact information: [address] [place]  [address] [phone]        Thurmon Fair, MD Follow up on 10/13/2019.   Specialty: Cardiology Why: at 3:00PM Contact information: [address] [place] [address] [phone]            Discharge Medications   Allergies as of 09/15/2019      Reactions   Lactose Intolerance (gi) Diarrhea      Medication List    TAKE these medications   acetaminophen 325  MG tablet Commonly known as: TYLENOL Take 2 tablets (650 mg total) by mouth every 4 (four) hours as needed for headache or mild pain.   apixaban 5 MG Tabs tablet Commonly known as: ELIQUIS Take 1 tablet (5 mg total) by mouth 2 (two) times daily.   atorvastatin 80 MG tablet Commonly known as: Lipitor Take 1 tablet (80 mg total) by mouth daily.   benzonatate 200 MG capsule Commonly known as: TESSALON Take 1 capsule (200 mg total) by mouth 3 (three) times daily.   cephALEXin 500 MG capsule Commonly known as: KEFLEX Take 1 capsule (500 mg total) by mouth 4 (four) times daily.   cilostazol 50 MG tablet Commonly known as: PLETAL Take 1 tablet (50 mg total) by mouth 2 (two) times daily.   lisinopril 2.5 MG tablet Commonly known as: Zestril Take 1 tablet (2.5 mg total) by mouth daily.   mometasone-formoterol 100-5 MCG/ACT Aero Commonly known as: DULERA Inhale 2 puffs into the lungs 2 (two) times daily. What changed:   when to take this  reasons to take this   pantoprazole 40 MG tablet Commonly known as: PROTONIX Take 1 tablet (40 mg total) by mouth daily at 6 (six) AM.   umeclidinium bromide 62.5 MCG/INH Aepb Commonly known as: INCRUSE ELLIPTA Inhale 1 puff into the lungs daily.   vitamin B-12 100 MCG tablet Commonly known as: CYANOCOBALAMIN Take 1 tablet (100 mcg total) by mouth daily.          Outstanding Labs/Studies     Duration of Discharge Encounter   Greater than 30 minutes including physician time.  Signed, Nada Boozer, NP 09/15/2019, 10:48 AM

## 2019-09-15 NOTE — Progress Notes (Signed)
Progress Note  Patient Name: Joe Durham Date of Encounter: 09/15/2019  Primary Cardiologist: Thurmon Fair, MD  PV Dr. Allyson Sabal  Subjective   No complaints, he has had some of his usual angina.   Inpatient Medications    Scheduled Meds: . aspirin EC  81 mg Oral Daily  . atorvastatin  80 mg Oral Daily  . lisinopril  2.5 mg Oral Daily  . pantoprazole  40 mg Oral Q0600  . sodium chloride flush  3 mL Intravenous Q12H  . sodium chloride flush  3 mL Intravenous Q12H   Continuous Infusions: . sodium chloride     PRN Meds: sodium chloride, acetaminophen, hydrALAZINE, labetalol, morphine injection, ondansetron (ZOFRAN) IV, sodium chloride flush   Vital Signs    Vitals:   09/14/19 2100 09/15/19 0058 09/15/19 0430 09/15/19 0433  BP: 129/69 (!) 116/101 (!) 178/86   Pulse:   64   Resp:   14   Temp:   98 F (36.7 C)   TempSrc:   Oral   SpO2:   97%   Weight:    53 kg  Height:        Intake/Output Summary (Last 24 hours) at 09/15/2019 0717 Last data filed at 09/15/2019 0649 Gross per 24 hour  Intake 540 ml  Output 300 ml  Net 240 ml   Last 3 Weights 09/15/2019 09/14/2019 09/14/2019  Weight (lbs) 116 lb 12.8 oz 118 lb 13.3 oz 120 lb  Weight (kg) 52.98 kg 53.9 kg 54.432 kg      Telemetry    Mobitz 1- Personally Reviewed  ECG    Mobitz I no ST changes - Personally Reviewed  Physical Exam   GEN: No acute distress.   Neck: No JVD Cardiac: RRR, though some irregularity with ausculation no murmurs, rubs, or gallops. Lt groin without hematoma Respiratory: Clear to auscultation bilaterally. GI: Soft, nontender, non-distended  MS: No edema; No deformity. Neuro:  Nonfocal  Psych: Normal affect   Labs    High Sensitivity Troponin:  No results for input(s): TROPONINIHS in the last 720 hours.    Chemistry Recent Labs  Lab 09/11/19 1146 09/15/19 0331  NA 144 136  K 4.8 3.6  CL 104 105  CO2 25 22  GLUCOSE 90 107*  BUN 9 13  CREATININE 1.06 1.07  CALCIUM  9.9 9.0  GFRNONAA 70 >60  GFRAA 81 >60  ANIONGAP  --  9     Hematology Recent Labs  Lab 09/11/19 1146 09/15/19 0331  WBC 5.7 4.8  RBC 3.99* 3.65*  HGB 13.9 12.8*  HCT 42.6 37.7*  MCV 107* 103.3*  MCH 34.8* 35.1*  MCHC 32.6 34.0  RDW 12.1 13.5  PLT 245 184    BNPNo results for input(s): BNP, PROBNP in the last 168 hours.   DDimer No results for input(s): DDIMER in the last 168 hours.   Radiology    No results found.  Cardiac Studies   PV angio Angiographic Data:   1: Abdominal aorta-the renal arteries widely patent.  The infrarenal abdominal aorta was free of atherosclerotic changes 2: Left lower extremity-there was a 90% eccentric mid left SFA stenosis.  Left popliteal was occluded with 0 vessel runoff 3: Right lower extremity-the distal right SFA was occluded as was the popliteal and the proximal tibial vessels with 0 vessel runoff  Patient Profile     73 y.o. male with hx of CAD, CABG, PAF, RVR,  HTN, HLD, tobacco use, Mobitz I,  ETOH use, CVA, with  Lt calf claudication and ABIs at 0.6 bil. with occluded right popliteal and tibial vessel occlusion on the left.   Assessment & Plan    Claudication with PAD s/p PV angio with occluded popliteal and tibial vessels with 0 vessel runoff bilaterally.  There are no endovascular options.  He is claudicant and does not have critical limb ischemia.  Medical therapy will be recommended - kept overnight for hydration  HTN during the night and prn hydralazine given. This AM 140/68   CAD with hx CABG and no angina  PAF on eliquis will resume today may need to stop aSA with hx of ETOH use   Mobitz I has on tele. Asymptomatic and HR in 40s at night.   Tobacco use discussed stopping.  He will try per pt.     For questions or updates, please contact [place] Please consult [email or web address] for contact info under        Signed, Cecilie Kicks, NP  09/15/2019, 7:17 AM

## 2019-09-15 NOTE — Discharge Instructions (Signed)
Call [place] at [phone] if any bleeding, swelling or drainage at cath site.  May shower, no tub baths for 48 hours for groin sticks. No lifting over 5 pounds for 3 days.  No Driving for 3 days.  Call if pain or swelling at the site.   Heart healthy diet   Stop smoking very important.and if you drink alcohol decrease to no more than 1 drink per day. If that

## 2019-09-15 NOTE — Progress Notes (Signed)
Blood pressure 178/86, give PRN Hydralazine. Will reassess patient.

## 2019-09-24 ENCOUNTER — Encounter (HOSPITAL_COMMUNITY): Payer: Medicare Other

## 2019-09-24 ENCOUNTER — Inpatient Hospital Stay (HOSPITAL_COMMUNITY): Admission: RE | Admit: 2019-09-24 | Payer: Medicare Other | Source: Ambulatory Visit

## 2019-09-29 ENCOUNTER — Ambulatory Visit (INDEPENDENT_AMBULATORY_CARE_PROVIDER_SITE_OTHER): Payer: Medicare Other | Admitting: Cardiovascular Disease

## 2019-09-29 ENCOUNTER — Other Ambulatory Visit: Payer: Self-pay

## 2019-09-29 ENCOUNTER — Encounter: Payer: Self-pay | Admitting: Cardiovascular Disease

## 2019-09-29 DIAGNOSIS — I739 Peripheral vascular disease, unspecified: Secondary | ICD-10-CM | POA: Diagnosis not present

## 2019-09-29 NOTE — Patient Instructions (Signed)
Medication Instructions:  Your physician recommends that you continue on your current medications as directed. Please refer to the Current Medication list given to you today.  If you need a refill on your cardiac medications before your next appointment, please call your pharmacy.   Lab work: NONE  Testing/Procedures: NONE  Follow-Up: At [facility], you and your health needs are our priority.  As part of our continuing mission to provide you with exceptional heart care, we have created designated Provider Care Teams.  These Care Teams include your primary Cardiologist (physician) and Advanced Practice Providers (APPs -  Physician Assistants and Nurse Practitioners) who all work together to provide you with the care you need, when you need it. You may see Dr Berry or one of the following Advanced Practice Providers on your designated Care Team:    Luke Kilroy, PA-C  Callie Goodrich, PA-C  Jesse Cleaver, FNP  Your physician wants you to follow-up in: 6 months. You will receive a reminder letter in the mail two months in advance. If you don't receive a letter, please call our office to schedule the follow-up appointment.      

## 2019-09-29 NOTE — Assessment & Plan Note (Signed)
History of right greater than left lower extremity claudication with recent angiogram performed 05/14/2019 revealing an occluded distal right SFA, popliteal artery and tibial vessels with 0 vessel runoff.  His left lower extremity had similar anatomy.  There are no endovascular surgical options.  I did prescribe Pletal which he is taking apparently there is some benefit.  I will see him back in 6 months.

## 2019-09-29 NOTE — Progress Notes (Signed)
09/29/2019 Joe Durham   [birth year]  [id number]  Primary Physician Patient, No Pcp Per Primary Cardiologist: Runell Gess MD Nicholes Calamity, [place]  HPI:  Joe Durham is a 73 y.o.  thin appearing single African-American male father of 4, grandfather and 6 grandchildren referred to me by Dr. Royann Shivers for peripheral vascular evaluation.I last saw him in the office  09/01/2019. He has a history of treated hypertension, hyperlipidemia and tobacco abuse. He has smoked 50 pack years. He drinks 1/2 pint of alcohol a day. 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 He's never had a heart attack but has had a stroke in the past. He is complaining of left calf claudication. His recent Dopplers performed 09/25/16 revealed ABIs in the 0.6 range bilaterally with an occluded right popliteal and tibial vessel occlusion on the left.  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 He also recently was admitted with urosepsis and was found to be in A. fib with RVR. He complains of bilateral calf claudication especially when walking up hills.  He had lower extremity arterial Doppler studies performed 08/05/2019 revealing a right ABI of 0.46 and a left ABI 0.62 with a high-frequency signal in the distal right SFA, occluded popliteal and tibial vessels.  He has right greater than left lower extremity lifestyle limiting claudication.    I performed peripheral angiography on him 09/14/2019 revealing occlusion of the distal right SFA, popliteal and tibial vessels with similar anatomy on the left and 0 vessel runoff.  There were no revascularization options.  I did begin him on Pletal which has afforded him some mild relief.  Current Meds  Medication Sig  . acetaminophen (TYLENOL) 325 MG tablet Take 2 tablets (650 mg total) by mouth every 4 (four) hours as needed for headache or mild pain.  Marland Kitchen apixaban (ELIQUIS) 5 MG TABS  tablet Take 1 tablet (5 mg total) by mouth 2 (two) times daily.  Marland Kitchen atorvastatin (LIPITOR) 80 MG tablet Take 1 tablet (80 mg total) by mouth daily.  . benzonatate (TESSALON) 200 MG capsule Take 1 capsule (200 mg total) by mouth 3 (three) times daily.  . cephALEXin (KEFLEX) 500 MG capsule Take 1 capsule (500 mg total) by mouth 4 (four) times daily.  . cilostazol (PLETAL) 50 MG tablet Take 1 tablet (50 mg total) by mouth 2 (two) times daily.  Marland Kitchen lisinopril (ZESTRIL) 2.5 MG tablet Take 1 tablet (2.5 mg total) by mouth daily.  . mometasone-formoterol (DULERA) 100-5 MCG/ACT AERO Inhale 2 puffs into the lungs 2 (two) times daily. (Patient taking differently: Inhale 2 puffs into the lungs 2 (two) times daily as needed for wheezing. )  . pantoprazole (PROTONIX) 40 MG tablet Take 1 tablet (40 mg total) by mouth daily at 6 (six) AM.  . umeclidinium bromide (INCRUSE ELLIPTA) 62.5 MCG/INH AEPB Inhale 1 puff into the lungs daily.  . vitamin B-12 (CYANOCOBALAMIN) 100 MCG tablet Take 1 tablet (100 mcg total) by mouth daily.     Allergies  Allergen Reactions  . Lactose Intolerance (Gi) Diarrhea    Social History   Socioeconomic History  . Marital status: Single    Spouse name: Not on file  . Number of children: 4  . Years of education: 9  . Highest education level: Not on file  Occupational History  . Occupation: retired    Comment: Training and development officer  . Financial resource strain: Not on file  .  Food insecurity    Worry: Not on file    Inability: Not on file  . Transportation needs    Medical: Not on file    Non-medical: Not on file  Tobacco Use  . Smoking status: Current Every Day Smoker    Packs/day: 0.25    Years: 52.00    Pack years: 13.00    Types: Cigarettes  . Smokeless tobacco: Never Used  . Tobacco comment: 07-08-2018 pt is down to 1ppw from 1 ppd, 12-22-2018 about  half a pack per day   Substance and Sexual Activity  . Alcohol use: Yes    Alcohol/week: 0.0 standard drinks     Comment: 08/2018 ! drink 1 pint a day 3 times a week on average  . Drug use: No    Comment: history of cocaine ; last use 7 months ago   . Sexual activity: Not on file  Lifestyle  . Physical activity    Days per week: Not on file    Minutes per session: Not on file  . Stress: Not on file  Relationships  . Social Herbalist on phone: Not on file    Gets together: Not on file    Attends religious service: Not on file    Active member of club or organization: Not on file    Attends meetings of clubs or organizations: Not on file    Relationship status: Not on file  . Intimate partner violence    Fear of current or ex partner: Not on file    Emotionally abused: Not on file    Physically abused: Not on file    Forced sexual activity: Not on file  Other Topics Concern  . Not on file  Social History Narrative   Single   Right handed   Caffeine use - sodas on weekends with alcohol     Review of Systems: General: negative for chills, fever, night sweats or weight changes.  Cardiovascular: negative for chest pain, dyspnea on exertion, edema, orthopnea, palpitations, paroxysmal nocturnal dyspnea or shortness of breath Dermatological: negative for rash Respiratory: negative for cough or wheezing Urologic: negative for hematuria Abdominal: negative for nausea, vomiting, diarrhea, bright red blood per rectum, melena, or hematemesis Neurologic: negative for visual changes, syncope, or dizziness All other systems reviewed and are otherwise negative except as noted above.    Blood pressure (!) 180/82, pulse (!) 58, temperature 97.9 F (36.6 C), height 5\' 7"  (1.702 m), weight 120 lb (54.4 kg), SpO2 98 %.  General appearance: alert and no distress Neck: no adenopathy, no carotid bruit, no JVD, supple, symmetrical, trachea midline and thyroid not enlarged, symmetric, no tenderness/mass/nodules Lungs: clear to auscultation bilaterally Heart: regular rate and rhythm, S1, S2  normal, no murmur, click, rub or gallop Extremities: extremities normal, atraumatic, no cyanosis or edema Pulses: Absent pedal pulses Skin: Skin color, texture, turgor normal. No rashes or lesions Neurologic: Alert and oriented X 3, normal strength and tone. Normal symmetric reflexes. Normal coordination and gait  EKG not performed today  ASSESSMENT AND PLAN:   Claudication in peripheral vascular disease ([place]) History of right greater than left lower extremity claudication with recent angiogram performed 05/14/2019 revealing an occluded distal right SFA, popliteal artery and tibial vessels with 0 vessel runoff.  His left lower extremity had similar anatomy.  There are no endovascular surgical options.  I did prescribe Pletal which he is taking apparently there is some benefit.  I will see him back in  6 months.      Runell Gess MD FACP,FACC,FAHA, [facility] 09/29/2019 12:24 PM

## 2019-10-09 ENCOUNTER — Encounter (HOSPITAL_COMMUNITY): Payer: Medicare Other

## 2019-10-13 ENCOUNTER — Ambulatory Visit: Payer: Medicare Other | Admitting: Cardiovascular Disease

## 2019-10-21 ENCOUNTER — Other Ambulatory Visit: Payer: Self-pay

## 2019-10-21 ENCOUNTER — Emergency Department (HOSPITAL_COMMUNITY)
Admission: EM | Admit: 2019-10-21 | Discharge: 2019-10-21 | Disposition: A | Discharge status: Home / Self Care | Payer: Medicare Other | Attending: Emergency Medicine | Admitting: Emergency Medicine

## 2019-10-21 DIAGNOSIS — T83010A Breakdown (mechanical) of cystostomy catheter, initial encounter: Secondary | ICD-10-CM

## 2019-10-21 DIAGNOSIS — Y69 Unspecified misadventure during surgical and medical care: Secondary | ICD-10-CM | POA: Insufficient documentation

## 2019-10-21 DIAGNOSIS — R300 Dysuria: Secondary | ICD-10-CM | POA: Diagnosis not present

## 2019-10-21 DIAGNOSIS — R1030 Lower abdominal pain, unspecified: Secondary | ICD-10-CM | POA: Diagnosis not present

## 2019-10-21 DIAGNOSIS — Z79899 Other long term (current) drug therapy: Secondary | ICD-10-CM | POA: Diagnosis not present

## 2019-10-21 DIAGNOSIS — J449 Chronic obstructive pulmonary disease, unspecified: Secondary | ICD-10-CM | POA: Diagnosis not present

## 2019-10-21 DIAGNOSIS — Z7901 Long term (current) use of anticoagulants: Secondary | ICD-10-CM | POA: Diagnosis not present

## 2019-10-21 DIAGNOSIS — N3000 Acute cystitis without hematuria: Secondary | ICD-10-CM | POA: Diagnosis not present

## 2019-10-21 DIAGNOSIS — R339 Retention of urine, unspecified: Secondary | ICD-10-CM

## 2019-10-21 DIAGNOSIS — I252 Old myocardial infarction: Secondary | ICD-10-CM | POA: Insufficient documentation

## 2019-10-21 DIAGNOSIS — Z8673 Personal history of transient ischemic attack (TIA), and cerebral infarction without residual deficits: Secondary | ICD-10-CM | POA: Diagnosis not present

## 2019-10-21 DIAGNOSIS — F17210 Nicotine dependence, cigarettes, uncomplicated: Secondary | ICD-10-CM | POA: Insufficient documentation

## 2019-10-21 DIAGNOSIS — Z96641 Presence of right artificial hip joint: Secondary | ICD-10-CM | POA: Insufficient documentation

## 2019-10-21 DIAGNOSIS — Z951 Presence of aortocoronary bypass graft: Secondary | ICD-10-CM | POA: Diagnosis not present

## 2019-10-21 DIAGNOSIS — T83198A Other mechanical complication of other urinary devices and implants, initial encounter: Secondary | ICD-10-CM | POA: Diagnosis present

## 2019-10-21 LAB — URINALYSIS, ROUTINE W REFLEX MICROSCOPIC
Bilirubin Urine: NEGATIVE
Glucose, UA: NEGATIVE mg/dL
Ketones, ur: NEGATIVE mg/dL
Nitrite: POSITIVE — AB
Protein, ur: 100 mg/dL — AB
Specific Gravity, Urine: 1.012 (ref 1.005–1.030)
WBC, UA: >50 WBC/hpf — ABNORMAL HIGH (ref 0–5)
pH: 8 (ref 5.0–8.0)

## 2019-10-21 LAB — CBC WITH DIFFERENTIAL/PLATELET
Abs Immature Granulocytes: 0.03 10*3/uL (ref 0.00–0.07)
Basophils Absolute: 0.1 10*3/uL (ref 0.0–0.1)
Basophils Relative: 1 %
Eosinophils Absolute: 0.1 10*3/uL (ref 0.0–0.5)
Eosinophils Relative: 1 %
HCT: 39.4 % (ref 39.0–52.0)
Hemoglobin: 12.7 g/dL — ABNORMAL LOW (ref 13.0–17.0)
Immature Granulocytes: 0 %
Lymphocytes Relative: 14 %
Lymphs Abs: 1.1 10*3/uL (ref 0.7–4.0)
MCH: 35 pg — ABNORMAL HIGH (ref 26.0–34.0)
MCHC: 32.2 g/dL (ref 30.0–36.0)
MCV: 108.5 fL — ABNORMAL HIGH (ref 80.0–100.0)
Monocytes Absolute: 0.9 10*3/uL (ref 0.1–1.0)
Monocytes Relative: 13 %
Neutro Abs: 5.2 10*3/uL (ref 1.7–7.7)
Neutrophils Relative %: 71 %
Platelets: 216 10*3/uL (ref 150–400)
RBC: 3.63 MIL/uL — ABNORMAL LOW (ref 4.22–5.81)
RDW: 13.7 % (ref 11.5–15.5)
WBC: 7.4 10*3/uL (ref 4.0–10.5)
nRBC: 0 % (ref 0.0–0.2)

## 2019-10-21 LAB — COMPREHENSIVE METABOLIC PANEL
ALT: 12 U/L (ref 0–44)
AST: 24 U/L (ref 15–41)
Albumin: 3.9 g/dL (ref 3.5–5.0)
Alkaline Phosphatase: 64 U/L (ref 38–126)
Anion gap: 6 (ref 5–15)
BUN: 11 mg/dL (ref 8–23)
CO2: 28 mmol/L (ref 22–32)
Calcium: 9.4 mg/dL (ref 8.9–10.3)
Chloride: 101 mmol/L (ref 98–111)
Creatinine, Ser: 0.91 mg/dL (ref 0.61–1.24)
GFR calc Af Amer: >60 mL/min (ref >60)
GFR calc non Af Amer: >60 mL/min (ref >60)
Glucose, Bld: 105 mg/dL — ABNORMAL HIGH (ref 70–99)
Potassium: 3.6 mmol/L (ref 3.5–5.1)
Sodium: 135 mmol/L (ref 135–145)
Total Bilirubin: 1.2 mg/dL (ref 0.3–1.2)
Total Protein: 8 g/dL (ref 6.5–8.1)

## 2019-10-21 MED ORDER — SULFAMETHOXAZOLE-TRIMETHOPRIM 800-160 MG PO TABS: 1.0000 | ORAL_TABLET | Freq: Two times a day (BID) | ORAL | 0 refills | Status: AC

## 2019-10-21 MED ORDER — SULFAMETHOXAZOLE-TRIMETHOPRIM 800-160 MG PO TABS
1.0000 | ORAL_TABLET | Freq: Once | ORAL | Status: AC
  Administered 2019-10-21: 14:00:00 1 via ORAL
  Filled 2019-10-21: qty 1

## 2019-10-21 NOTE — ED Notes (Signed)
Pts cathter was changed by Tegeler MD. Pt bedding, brief, and leg bag were also changed at this time

## 2019-10-21 NOTE — ED Notes (Signed)
Pt ambulatory to bathroom with cane as assistance.

## 2019-10-21 NOTE — ED Notes (Signed)
Unable to flush met to much resistance. 16 Fr placed at bedside Md made aware

## 2019-10-21 NOTE — ED Provider Notes (Signed)
[facility]-EMERGENCY DEPT Provider Note   CSN: [id number] Arrival date & time: 10/21/19  1013     History Chief Complaint  Patient presents with  . urostomy tube out    Joe Durham is a 73 y.o. male.  The history is provided by the patient and medical records. No language interpreter was used.  Abdominal Pain Pain location:  Suprapubic Pain quality: aching   Pain radiates to:  Does not radiate Pain severity:  Moderate Onset quality:  Gradual Duration:  3 days Timing:  Constant Progression:  Unchanged Chronicity:  Recurrent Context comment:  Suprapubic catheter not draining Relieved by:  Nothing Worsened by:  Nothing Ineffective treatments:  None tried Associated symptoms: dysuria   Associated symptoms: no chest pain, no chills, no constipation, no cough, no diarrhea, no fatigue, no fever, no nausea, no shortness of breath and no vomiting        Past Medical History:  Diagnosis Date  . Atrial fibrillation (HCC)   . Bladder calculi   . COPD (chronic obstructive pulmonary disease) (HCC)   . DOE (dyspnea on exertion)   . Elevated blood pressure reading    12-22-2018 reprots he hasnt had his BP meds since this past thursday   . Foley catheter in place   . History of bladder stone   . 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 History of urinary retention   . Hypertension   . Mobitz type 1 second degree atrioventricular block   . Myocardial infarction [facility]) 2019   non stemi  . Osteoarthritis   . PAD (peripheral artery disease) (HCC)   . Paroxysmal atrial tachycardia (HCC)   . Peripheral vascular disease (HCC)    last duplex 09-25-2016 bilateral ABI 0.6 and occulsion of  right popiteal posterior tibials and left peroneal  . Presence of suprapubic catheter (HCC)   . Stroke (HCC)   . Wears glasses     Patient Active Problem List    Diagnosis Date Noted  . Claudication in peripheral vascular disease (HCC) 09/14/2019  . Incarcerated left inguinal hernia 08/16/2019  . Long term current use of anticoagulant therapy 06/02/2019  . COPD (chronic obstructive pulmonary disease) (HCC) 06/01/2019  . B12 deficiency 05/04/2019  . Left inguinal hernia 05/04/2019  . Cough   . Anemia   . Bacteremia due to Gram-negative bacteria 05/02/2019  . Sepsis (HCC) 05/01/2019  . New onset a-fib (HCC) 05/01/2019  . Pre-operative clearance 12/15/2018  . 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 NSTEMI (non-ST elevated myocardial infarction) (HCC) 09/03/2018  . Malnutrition of moderate degree 09/03/2018  . History of loop recorder 01/23/2018  . Mobitz type 1 second degree AV block 07/30/2017  . PAD (peripheral artery disease) (HCC) 07/30/2017  . Bladder calculi 07/15/2017  . UTI (urinary tract infection) 06/13/2017  . Acute cystitis with hematuria   . Gross hematuria 06/12/2017  . Renal insufficiency 06/12/2017  . Complicated UTI (urinary tract infection) 06/12/2017  . Acute pyelonephritis 06/12/2017  . Uncomplicated alcohol dependence (HCC)   . AKI (acute kidney injury) (HCC)   . Claudication (HCC) 11/14/2016  . Encounter for loop recorder check 07/19/2015  . First degree AV block 07/19/2015  . PAT (paroxysmal atrial tachycardia) (HCC) 07/19/2015  . Essential hypertension 04/05/2015  . Cocaine abuse (HCC) 04/05/2015  . Hospital discharge follow-up 01/10/2015  . 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 Lung mass   . Acute embolic stroke (HCC) 01/04/2015  . Cerebral infarction due to unspecified mechanism   . Visual changes   . Substance abuse (HCC)   . Dyslipidemia, goal LDL below 70   . History of stroke 01/03/2015  . Tobacco abuse 04/12/2014  . Bladder stones 04/12/2014    Past Surgical History:  Procedure Laterality Date  . ABDOMINAL AORTOGRAM W/LOWER EXTREMITY Bilateral 09/14/2019    Procedure: ABDOMINAL AORTOGRAM W/LOWER EXTREMITY;  Surgeon: Runell Gess, MD;  Location: [facility] INVASIVE CV LAB;  Service: Cardiovascular;  Laterality: Bilateral;  . CORONARY ARTERY BYPASS GRAFT N/A 09/05/2018   Procedure: CORONARY ARTERY BYPASS GRAFTING (CABG) times 3 using left internal mammary artery and right greater saphenous vein harvested endoscopically.;  Surgeon: Kerin Perna, MD;  Location: [facility] OR;  Service: Open Heart Surgery;  Laterality: N/A;  . CYSTOSCOPY WITH LITHOLAPAXY N/A 07/15/2017   Procedure: OPEN CYSTOLITHOTOMY;  Surgeon: Crista Elliot, MD;  Location: [facility] ORS;  Service: Urology;  Laterality: N/A;  . INGUINAL HERNIA REPAIR Right 2011  . INSERTION OF SUPRAPUBIC CATHETER N/A 07/15/2017   Procedure: INSERTION OF SUPRAPUBIC CATHETER;  Surgeon: Crista Elliot, MD;  Location: [facility] ORS;  Service: Urology;  Laterality: N/A;  . LEFT HEART CATH AND CORONARY ANGIOGRAPHY N/A 09/03/2018   Procedure: LEFT HEART CATH AND CORONARY ANGIOGRAPHY;  Surgeon: Iran Ouch, MD;  Location: [facility] INVASIVE CV LAB;  Service: Cardiovascular;  Laterality: N/A;  . LOOP RECORDER IMPLANT N/A 01/05/2015   Procedure: LOOP RECORDER IMPLANT;  Surgeon: Thurmon Fair, MD;  Location: [facility] CATH LAB;  Service: Cardiovascular;  Laterality: N/A;  . ORIF WRIST FRACTURE Left 03/20/2014   Procedure: OPEN REDUCTION INTERNAL FIXATION (ORIF) WRIST FRACTURE;  Surgeon: Dominica Severin, MD;  Location: [facility] OR;  Service: Orthopedics;  Laterality: Left;  . TEE WITHOUT CARDIOVERSION N/A 01/05/2015   Procedure: TRANSESOPHAGEAL ECHOCARDIOGRAM (TEE)/LOOP;  Surgeon: Thurmon Fair, MD;  Location: [facility] ENDOSCOPY;  Service: Cardiovascular;  Laterality: N/A; ef 60-65%, no LA or RA thrombus, trivial PR, mild TR  . TEE WITHOUT CARDIOVERSION N/A 09/05/2018   Procedure: TRANSESOPHAGEAL ECHOCARDIOGRAM (TEE);  Surgeon: Donata Clay, Theron Arista, MD;  Location: [facility] OR;  Service: Open Heart Surgery;  Laterality: N/A;  . TONSILLECTOMY  child  . TOTAL HIP  ARTHROPLASTY Right 2000       Family History  Problem Relation Age of Onset  . Stroke Mother   . Stroke Sister     Social History   Tobacco Use  . Smoking status: Current Every Day Smoker    Packs/day: 0.25    Years: 52.00    Pack years: 13.00    Types: Cigarettes  . Smokeless tobacco: Never Used  . Tobacco comment: 07-08-2018 pt is down to 1ppw from 1 ppd, 12-22-2018 about  half a pack per day   Substance Use Topics  . Alcohol use: Yes    Alcohol/week: 0.0 standard drinks    Comment: 08/2018 ! drink 1 pint a day 3 times a week on average  . Drug use: No    Comment: history of cocaine ; last use 7 months ago     Home Medications Prior to Admission medications   Medication Sig Start Date End Date Taking? Authorizing Provider  acetaminophen (TYLENOL) 325 MG tablet Take 2 tablets (650 mg total) by mouth every 4 (four) hours as needed for headache or mild pain. 09/15/19   Leone Brand, NP  apixaban Everlene Balls) 5  MG TABS tablet Take 1 tablet (5 mg total) by mouth 2 (two) times daily. 05/06/19   Regalado, Belkys A, MD  atorvastatin (LIPITOR) 80 MG tablet Take 1 tablet (80 mg total) by mouth daily. 12/15/18   Abelino DerrickKilroy, Luke K, PA-C  benzonatate (TESSALON) 200 MG capsule Take 1 capsule (200 mg total) by mouth 3 (three) times daily. 05/06/19   Regalado, Belkys A, MD  cephALEXin (KEFLEX) 500 MG capsule Take 1 capsule (500 mg total) by mouth 4 (four) times daily. 08/27/19   Felicie MornSmith, David, NP  cilostazol (PLETAL) 50 MG tablet Take 1 tablet (50 mg total) by mouth 2 (two) times daily. 09/15/19   Leone BrandIngold, Laura R, NP  lisinopril (ZESTRIL) 2.5 MG tablet Take 1 tablet (2.5 mg total) by mouth daily. 12/15/18   Kilroy, Eda PaschalLuke K, PA-C  mometasone-formoterol (DULERA) 100-5 MCG/ACT AERO Inhale 2 puffs into the lungs 2 (two) times daily. Patient taking differently: Inhale 2 puffs into the lungs 2 (two) times daily as needed for wheezing.  05/06/19   Regalado, Belkys A, MD  pantoprazole (PROTONIX) 40 MG tablet  Take 1 tablet (40 mg total) by mouth daily at 6 (six) AM. 05/07/19   Regalado, Belkys A, MD  umeclidinium bromide (INCRUSE ELLIPTA) 62.5 MCG/INH AEPB Inhale 1 puff into the lungs daily. 05/07/19   Regalado, Belkys A, MD  vitamin B-12 (CYANOCOBALAMIN) 100 MCG tablet Take 1 tablet (100 mcg total) by mouth daily. 05/06/19 05/05/20  Regalado, Jon BillingsBelkys A, MD    Allergies    Lactose intolerance (gi)  Review of Systems   Review of Systems  Constitutional: Negative for chills, diaphoresis, fatigue and fever.  HENT: Negative for congestion.   Respiratory: Negative for cough, chest tightness, shortness of breath and wheezing.   Cardiovascular: Negative for chest pain, palpitations and leg swelling.  Gastrointestinal: Positive for abdominal pain. Negative for constipation, diarrhea, nausea and vomiting.  Genitourinary: Positive for decreased urine volume, difficulty urinating and dysuria.       Decreased urine output from foley, drainage around catheter  Musculoskeletal: Negative for back pain, neck pain and neck stiffness.  Skin: Negative for rash and wound.  Neurological: Negative for light-headedness and headaches.  Psychiatric/Behavioral: Negative for agitation.  All other systems reviewed and are negative.   Physical Exam Updated Vital Signs BP (!) 226/106 Comment: pt has not taken bp meds today  Pulse 76   Temp 97.6 F (36.4 C) (Oral)   Resp 16   SpO2 100%   Physical Exam Vitals and nursing note reviewed.  Constitutional:      General: He is not in acute distress.    Appearance: He is well-developed. He is not ill-appearing, toxic-appearing or diaphoretic.  HENT:     Head: Normocephalic and atraumatic.     Mouth/Throat:     Mouth: Mucous membranes are moist.     Pharynx: No oropharyngeal exudate or posterior oropharyngeal erythema.  Eyes:     Conjunctiva/sclera: Conjunctivae normal.     Pupils: Pupils are equal, round, and reactive to light.  Cardiovascular:     Rate and Rhythm:  Normal rate and regular rhythm.     Pulses: Normal pulses.     Heart sounds: No murmur.  Pulmonary:     Effort: Pulmonary effort is normal. No respiratory distress.     Breath sounds: Normal breath sounds. No wheezing, rhonchi or rales.  Chest:     Chest wall: No tenderness.  Abdominal:     General: Abdomen is flat. Bowel sounds are normal.  There is no distension.     Palpations: Abdomen is soft.     Tenderness: There is abdominal tenderness. There is no right CVA tenderness or left CVA tenderness.    Musculoskeletal:        General: No tenderness.     Cervical back: Neck supple. No tenderness.     Right lower leg: No edema.     Left lower leg: No edema.  Skin:    General: Skin is warm and dry.     Capillary Refill: Capillary refill takes less than 2 seconds.  Neurological:     General: No focal deficit present.     Mental Status: He is alert.  Psychiatric:        Mood and Affect: Mood normal.     ED Results / Procedures / Treatments   Labs (all labs ordered are listed, but only abnormal results are displayed) Labs Reviewed  CBC WITH DIFFERENTIAL/PLATELET - Abnormal; Notable for the following components:      Result Value   RBC 3.63 (*)    Hemoglobin 12.7 (*)    MCV 108.5 (*)    MCH 35.0 (*)    All other components within normal limits  COMPREHENSIVE METABOLIC PANEL - Abnormal; Notable for the following components:   Glucose, Bld 105 (*)    All other components within normal limits  URINALYSIS, ROUTINE W REFLEX MICROSCOPIC - Abnormal; Notable for the following components:   APPearance CLOUDY (*)    Hgb urine dipstick SMALL (*)    Protein, ur 100 (*)    Nitrite POSITIVE (*)    Leukocytes,Ua MODERATE (*)    WBC, UA >50 (*)    Bacteria, UA MANY (*)    All other components within normal limits  URINE CULTURE    EKG None  Radiology No results found.  Procedures SUPRAPUBIC TUBE PLACEMENT  Date/Time: 10/21/2019 12:17 PM Performed by: Heide Scalesegeler, Marylon Verno J,  MD Authorized by: Heide Scalesegeler, Delayza Lungren J, MD   Consent:    Consent obtained:  Verbal   Consent given by:  Patient   Risks discussed:  Infection and pain   Alternatives discussed:  No treatment Procedure details:    Complexity:  Simple   Catheter type:  Foley   Catheter size:  16 Fr   Ultrasound guidance: no     Number of attempts:  1   Urine characteristics:  Foul-smelling and cloudy Post-procedure details:    Patient tolerance of procedure:  Tolerated well, no immediate complications Comments:     Suprapubic catheter replacement. 16 french foley replaced.    (including critical care time)  Medications Ordered in ED Medications  sulfamethoxazole-trimethoprim (BACTRIM DS) 800-160 MG per tablet 1 tablet (1 tablet Oral Given 10/21/19 1355)    ED Course  I have reviewed the triage vital signs and the nursing notes.  Pertinent labs & imaging results that were available during my care of the patient were reviewed by me and considered in my medical decision making (see chart for details).    MDM Rules/Calculators/A&P                      Joe KaufmanGeronimo Corriveau is a 73 y.o. male with a past medical history significant for dyslipidemia, prior stroke, hypertension, substance abuse, and suprapubic catheter dependence who presents with abdominal pain and catheter problem.  Patient reports that his catheter was clogged several weeks ago and after flushing and getting it working again, he was told he had a UTI.  He reports he took 1 dose of antibiotics in the emergency department but did not take antibiotics after leaving.  He reports that he has had some continued dysuria and leaking around the catheter site.  He says that over the last few days his catheter stopped draining and his abdominal pain has persisted.  He describes it as moderate to severe in his lower suprapubic area.  He denies any flank pain or flank tenderness.  He denies fevers, chills, nausea, vomiting, constipation, or diarrhea.  No  recent trauma.  He reports the urine that does come out is foul-smelling and cloudy.  On exam, patient has tenderness around the suprapubic catheter.  There is some drainage of urine when I press on his abdomen.  Bowel sounds were appreciated and were normal.  No CVA tenderness or flank tenderness.  Lungs clear and chest is nontender.  Patient is hypertensive in the emergency department.  Patient had a bladder scan performed showing over 300 cc of urine.  As his catheter is not draining through the catheter, I suspect it is clogged again.  Foley catheter flushing was attempted by charge nurse and was unsuccessful.  We talked with patient and will replace the suprapubic catheter in the emergency department.  Catheter was replaced without difficulty with a 16 French Foley catheter.  Urine was removed and his pain significantly improved.  Due to several days of decreased output, will check labs including kidney function.  Will get urinalysis and culture sent.  Due to the foul smell, I do suspect he still has a UTI.  We will likely send patient home with antibiotics.  Anticipate discharge after lab work is completed for outpatient treatment of likely UTI after the Foley cath was placed and worked without difficulty.       1:11 PM After Foley catheter replacement, patient did have urinalysis completed which shows nitrites leukocytes and bacteria.  Previous cultures were reviewed and it shows that his Morganella and Proteus both appear sensitive to Bactrim.  As he reports he did not take an antibiotic last time, he will be given a prescription for Bactrim.  No evidence of AKI.  Patient is feeling better after Foley replacement.  Patient will be discharged home for follow-up with his urologist and PCP.  Patient understood return precautions and was discharged in good condition.    Final Clinical Impression(s) / ED Diagnoses Final diagnoses:  Suprapubic catheter dysfunction, initial encounter [facility])   Urinary retention  Lower abdominal pain  Acute cystitis without hematuria    Rx / DC Orders ED Discharge Orders         Ordered    sulfamethoxazole-trimethoprim (BACTRIM DS) 800-160 MG tablet  2 times daily     10/21/19 1343          Clinical Impression: 1. Suprapubic catheter dysfunction, initial encounter ([place])   2. Urinary retention   3. Lower abdominal pain   4. Acute cystitis without hematuria     Disposition: Discharge  Condition: Good  I have discussed the results, Dx and Tx plan with the pt(& family if present). He/she/they expressed understanding and agree(s) with the plan. Discharge instructions discussed at great length. Strict return precautions discussed and pt &/or family have verbalized understanding of the instructions. No further questions at time of discharge.    New Prescriptions   SULFAMETHOXAZOLE-TRIMETHOPRIM (BACTRIM DS) 800-160 MG TABLET    Take 1 tablet by mouth 2 (two) times daily for 7 days.    Follow Up: Your PCP  and Urologist     [facility] [address] [id number] mc [place] [address] [phone]       Madisyn Mawhinney, Canary Brim, MD 10/21/19 1739

## 2019-10-21 NOTE — Discharge Instructions (Signed)
Your history and exam today are consistent with a clogged suprapubic Foley catheter.  As you had a large amount of urine in your bladder ultrasound, we exchanged the catheter with relief of your pain.  Your blood pressure improved after this.  Your urine is consistent with infection and a culture was sent.  Review of your previous cultures show that it is sensitive to Bactrim which is what we gave you a dose of.  Please fill the prescription and take it for the next week.  Please follow-up with your urologist and your primary doctor.

## 2019-10-21 NOTE — ED Notes (Signed)
Tegeler MD at bedside  

## 2019-10-21 NOTE — ED Notes (Signed)
Pt states he lives at home and will call a friend to come get him or take the bus

## 2019-10-21 NOTE — ED Notes (Addendum)
Bladder scan 285 ml. Pt made aware urine sample needed but denies urge to void at this time.

## 2019-10-21 NOTE — ED Triage Notes (Signed)
Per EMS- patient states his right urostomy tube came out and he reports that he tried to replace it and was not successful.Marland Kitchen

## 2019-10-21 NOTE — ED Notes (Signed)
Pt verbalizes understanding of DC instructions. Pt belongings returned and is ambulatory out of ED.  

## 2019-10-23 ENCOUNTER — Encounter (HOSPITAL_COMMUNITY): Payer: Medicare Other

## 2019-10-23 ENCOUNTER — Other Ambulatory Visit: Payer: Self-pay

## 2019-10-23 ENCOUNTER — Encounter: Payer: Self-pay | Admitting: Nurse Practitioner

## 2019-10-23 ENCOUNTER — Ambulatory Visit: Payer: Medicare Other | Attending: Nurse Practitioner | Admitting: Nurse Practitioner

## 2019-10-23 ENCOUNTER — Inpatient Hospital Stay (HOSPITAL_COMMUNITY): Admission: RE | Admit: 2019-10-23 | Payer: Medicare Other | Source: Ambulatory Visit

## 2019-10-23 DIAGNOSIS — Z7689 Persons encountering health services in other specified circumstances: Secondary | ICD-10-CM

## 2019-10-23 DIAGNOSIS — F17200 Nicotine dependence, unspecified, uncomplicated: Secondary | ICD-10-CM

## 2019-10-23 DIAGNOSIS — I739 Peripheral vascular disease, unspecified: Secondary | ICD-10-CM | POA: Diagnosis not present

## 2019-10-23 DIAGNOSIS — J449 Chronic obstructive pulmonary disease, unspecified: Secondary | ICD-10-CM

## 2019-10-23 DIAGNOSIS — F17210 Nicotine dependence, cigarettes, uncomplicated: Secondary | ICD-10-CM | POA: Diagnosis not present

## 2019-10-23 DIAGNOSIS — Z9359 Other cystostomy status: Secondary | ICD-10-CM

## 2019-10-23 DIAGNOSIS — K4030 Unilateral inguinal hernia, with obstruction, without gangrene, not specified as recurrent: Secondary | ICD-10-CM | POA: Diagnosis not present

## 2019-10-23 LAB — URINE CULTURE: Culture: >=100000 — AB

## 2019-10-23 NOTE — Progress Notes (Signed)
Virtual Visit via Telephone Note Due to national recommendations of social distancing due to [place] 19, telehealth visit is felt to be most appropriate for this patient at this time.  I discussed the limitations, risks, security and privacy concerns of performing an evaluation and management service by telephone and the availability of in person appointments. I also discussed with the patient that there may be a patient responsible charge related to this service. The patient expressed understanding and agreed to proceed.    I connected with Jarone Muldrow on 10/23/19  at   3:30 PM EST  EDT by telephone and verified that I am speaking with the correct person using two identifiers.   Consent I discussed the limitations, risks, security and privacy concerns of performing an evaluation and management service by telephone and the availability of in person appointments. I also discussed with the patient that there may be a patient responsible charge related to this service. The patient expressed understanding and agreed to proceed.   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   Has barriers getting to his appointments due to transportation issues. Will refer to case manager to contact regarding SCAT.   Currently seeing Cardiology (Dr. Carolann Littler angiography was performed on him 09/14/2019 revealing occlusion of the distal right SFA, popliteal and tibial vessels with similar anatomy on the left and 0 vessel runoff.  There were no revascularization options.  He was started on Pletal by Dr. Gwenlyn Found at that time.  Loop recorder placed 01-2015 2/2 paroxysmal atrial tachycardia.    Urinary Retention Seeing [facility]. He has not called to schedule for follow up appt. since being seen in the ED and requiring replacement of catheter due to blockage and UTI.  He states he can not have the suprapubic catheter permanently removed until he has hernia surgery.   Essential Hypertension Poorly controlled. He continues to smoke and does drink alcohol. Current medications include: carvedilol 6.25 mg BID, lisinopril 2.5 mg daily. Denies chest pain, shortness of breath, palpitations, lightheadedness, dizziness, headaches or BLE edema. Endorses mild claudication symptoms of BLE edema.  BP Readings from Last 3 Encounters:  10/21/19 (!) 181/79  09/29/19 (!) 180/82  09/15/19 (!) 134/94    Past Medical History:  Diagnosis Date  . Atrial fibrillation ([place])   . Bladder calculi   . COPD (chronic obstructive pulmonary disease) ([place])   . DOE (dyspnea on exertion)   . Elevated blood pressure reading    12-22-2018 reprots he hasnt had his BP meds since this past thursday   . Foley catheter in place   . History of bladder stone   . History of embolic stroke 07/3234   right PCA emobolism ischemic infarct (cyptogenic)  s/p LOOP recorder 01-05-2015;  07-08-2018 per pt no residual  . History of urinary retention   . Hypertension   .  Mobitz type 1 second degree atrioventricular block   . Myocardial infarction [facility]) 2019   non stemi  . Osteoarthritis   . PAD (peripheral  artery disease) (HCC)   . Paroxysmal atrial tachycardia (HCC)   . Peripheral vascular disease (HCC)    last duplex 09-25-2016 bilateral ABI 0.6 and occulsion of  right popiteal posterior tibials and left peroneal  . Presence of suprapubic catheter (HCC)   . Stroke (HCC)   . Wears glasses     Past Surgical History:  Procedure Laterality Date  . ABDOMINAL AORTOGRAM W/LOWER EXTREMITY Bilateral 09/14/2019   Procedure: ABDOMINAL AORTOGRAM W/LOWER EXTREMITY;  Surgeon: Runell Gess, MD;  Location: [facility] INVASIVE CV LAB;  Service: Cardiovascular;  Laterality: Bilateral;  . CORONARY ARTERY BYPASS GRAFT N/A 09/05/2018   Procedure: CORONARY ARTERY BYPASS GRAFTING (CABG) times 3 using left internal mammary artery and right greater saphenous vein harvested endoscopically.;  Surgeon: Kerin Perna, MD;  Location: [facility] OR;  Service: Open Heart Surgery;  Laterality: N/A;  . CYSTOSCOPY WITH LITHOLAPAXY N/A 07/15/2017   Procedure: OPEN CYSTOLITHOTOMY;  Surgeon: Crista Elliot, MD;  Location: [facility] ORS;  Service: Urology;  Laterality: N/A;  . INGUINAL HERNIA REPAIR Right 2011  . INSERTION OF SUPRAPUBIC CATHETER N/A 07/15/2017   Procedure: INSERTION OF SUPRAPUBIC CATHETER;  Surgeon: Crista Elliot, MD;  Location: [facility] ORS;  Service: Urology;  Laterality: N/A;  . LEFT HEART CATH AND CORONARY ANGIOGRAPHY N/A 09/03/2018   Procedure: LEFT HEART CATH AND CORONARY ANGIOGRAPHY;  Surgeon: Iran Ouch, MD;  Location: [facility] INVASIVE CV LAB;  Service: Cardiovascular;  Laterality: N/A;  . LOOP RECORDER IMPLANT N/A 01/05/2015   Procedure: LOOP RECORDER IMPLANT;  Surgeon: Thurmon Fair, MD;  Location: [facility] CATH LAB;  Service: Cardiovascular;  Laterality: N/A;  . ORIF WRIST FRACTURE Left 03/20/2014   Procedure: OPEN REDUCTION INTERNAL FIXATION (ORIF) WRIST FRACTURE;  Surgeon: Dominica Severin, MD;  Location: [facility] OR;  Service: Orthopedics;  Laterality: Left;  . TEE WITHOUT CARDIOVERSION N/A 01/05/2015   Procedure: TRANSESOPHAGEAL  ECHOCARDIOGRAM (TEE)/LOOP;  Surgeon: Thurmon Fair, MD;  Location: [facility] ENDOSCOPY;  Service: Cardiovascular;  Laterality: N/A; ef 60-65%, no LA or RA thrombus, trivial PR, mild TR  . TEE WITHOUT CARDIOVERSION N/A 09/05/2018   Procedure: TRANSESOPHAGEAL ECHOCARDIOGRAM (TEE);  Surgeon: Donata Clay, Theron Arista, MD;  Location: [facility] OR;  Service: Open Heart Surgery;  Laterality: N/A;  . TONSILLECTOMY  child  . TOTAL HIP ARTHROPLASTY Right 2000    Family History  Problem Relation Age of Onset  . Heart attack Sister     Social History   Socioeconomic History  . Marital status: Single    Spouse name: Not on file  . Number of children: 4  . Years of education: 9  . Highest education level: Not on file  Occupational History  . Occupation: retired    Comment: Holiday representative  Tobacco Use  . Smoking status: Current Every Day Smoker    Packs/day: 0.25    Years: 52.00    Pack years: 13.00    Types: Cigarettes  . Smokeless tobacco: Never Used  . Tobacco comment: 07-08-2018 pt is down to 1ppw from 1 ppd, 12-22-2018 about  half a pack per day   Substance and Sexual Activity  . Alcohol use: Yes    Alcohol/week: 0.0 standard drinks    Comment: 08/2018 ! drink 1 pint a day 3 times a week on average  . Drug use: No    Comment: history of cocaine ;  last use 7 months ago   . Sexual activity: Not on file  Other Topics Concern  . Not on file  Social History Narrative   Single   Right handed   Caffeine use - sodas on weekends with alcohol   Social Determinants of Health   Financial Resource Strain:   . Difficulty of Paying Living Expenses: Not on file  Food Insecurity:   . Worried About Programme researcher, broadcasting/film/video in the Last Year: Not on file  . Ran Out of Food in the Last Year: Not on file  Transportation Needs:   . Lack of Transportation (Medical): Not on file  . Lack of Transportation (Non-Medical): Not on file  Physical Activity:   . Days of Exercise per Week: Not on file  . Minutes of Exercise per  Session: Not on file  Stress:   . Feeling of Stress : Not on file  Social Connections:   . Frequency of Communication with Friends and Family: Not on file  . Frequency of Social Gatherings with Friends and Family: Not on file  . Attends Religious Services: Not on file  . Active Member of Clubs or Organizations: Not on file  . Attends Banker Meetings: Not on file  . Marital Status: Not on file     Observations/Objective: Awake, alert and oriented x 3   Review of Systems  Eyes: Positive for blurred vision (does not drive).    Assessment and Plan: Regnald was seen today for establish care.  Diagnoses and all orders for this visit:  Encounter to establish care Follow up for BP check/labs  Suprapubic catheter (HCC) Follow up with Urology as instructed  Tobacco dependence Deven was counseled on the dangers of tobacco use, and was advised to quit. Reviewed strategies to maximize success, including removing cigarettes and smoking materials from environment, stress management and support of family/friends as well as pharmacological alternatives including: Wellbutrin, Chantix, Nicotine patch, Nicotine gum or lozenges. Smoking cessation support: smoking cessation hotline: 1-800-QUIT-NOW.  Smoking cessation classes are also available through [facility] and [facility]. Call [phone] or visit our website at [email or web address].   A total of 3 minutes was spent on counseling for smoking cessation and Mccade is not ready to quit.   Incarcerated left inguinal hernia Will need f/u with general surgery   Claudication in peripheral vascular disease (HCC) Continue pletal F/U with cardiology in 6 months  Chronic obstructive pulmonary disease, unspecified COPD type (HCC) Continue inhalers: dulera, incruse Encouraged to stop smoking    Follow Up Instructions Return in about 6 weeks (around 12/04/2019) for HTN.     I discussed the assessment and treatment  plan with the patient. The patient was provided an opportunity to ask questions and all were answered. The patient agreed with the plan and demonstrated an understanding of the instructions.   The patient was advised to call back or seek an in-person evaluation if the symptoms worsen or if the condition fails to improve as anticipated.  I provided 26 minutes of non-face-to-face time during this encounter including median intraservice time, reviewing previous notes, labs, imaging, medications and explaining diagnosis and management.  Claiborne Rigg, FNP-BC

## 2019-10-24 ENCOUNTER — Telehealth: Payer: Self-pay | Admitting: Emergency Medicine

## 2019-10-24 NOTE — Telephone Encounter (Signed)
Post ED Visit - Positive Culture Follow-up  Culture report reviewed by antimicrobial stewardship pharmacist: [place] Team []  Elenor Quinones, Pharm.D. []  Heide Guile, Pharm.D., BCPS AQ-ID []  Parks Neptune, Pharm.D., BCPS []  Alycia Rossetti, Pharm.D., BCPS []  [place], [place].D., BCPS, AAHIVP []  Legrand Como, Pharm.D., BCPS, AAHIVP []  Salome Arnt, PharmD, BCPS []  Johnnette Gourd, PharmD, BCPS []  Hughes Better, PharmD, BCPS []  Leeroy Cha, PharmD []  Laqueta Linden, PharmD, BCPS []  Albertina Parr, PharmD  [place] Team []  Leodis Sias, PharmD []  Lindell Spar, PharmD []  Royetta Asal, PharmD []  Graylin Shiver, Rph []  Rema Fendt) Glennon Mac, PharmD []  Arlyn Dunning, PharmD []  Netta Cedars, PharmD []  Dia Sitter, PharmD []  Leone Haven, PharmD []  Gretta Arab, PharmD []  Theodis Shove, PharmD []  Peggyann Juba, PharmD []  Reuel Boom, PharmD   Positive urine culture Treated with sulfamethoxazole-trimethoprim, organism sensitive to the same and no further patient follow-up is required at this time.  Hazle Nordmann 10/24/2019, 1:46 PM

## 2019-11-08 ENCOUNTER — Encounter: Payer: Self-pay | Admitting: Nurse Practitioner

## 2019-11-17 ENCOUNTER — Other Ambulatory Visit: Payer: Self-pay

## 2019-11-17 ENCOUNTER — Emergency Department (HOSPITAL_COMMUNITY)
Admission: EM | Admit: 2019-11-17 | Discharge: 2019-11-17 | Disposition: A | Discharge status: Home / Self Care | Payer: Medicare Other | Attending: Emergency Medicine | Admitting: Emergency Medicine

## 2019-11-17 ENCOUNTER — Encounter (HOSPITAL_COMMUNITY): Payer: Self-pay

## 2019-11-17 DIAGNOSIS — I10 Essential (primary) hypertension: Secondary | ICD-10-CM | POA: Diagnosis not present

## 2019-11-17 DIAGNOSIS — T83010A Breakdown (mechanical) of cystostomy catheter, initial encounter: Secondary | ICD-10-CM

## 2019-11-17 DIAGNOSIS — T83011A Breakdown (mechanical) of indwelling urethral catheter, initial encounter: Secondary | ICD-10-CM | POA: Diagnosis present

## 2019-11-17 DIAGNOSIS — Z7901 Long term (current) use of anticoagulants: Secondary | ICD-10-CM | POA: Diagnosis not present

## 2019-11-17 DIAGNOSIS — F17210 Nicotine dependence, cigarettes, uncomplicated: Secondary | ICD-10-CM | POA: Insufficient documentation

## 2019-11-17 DIAGNOSIS — Y732 Prosthetic and other implants, materials and accessory gastroenterology and urology devices associated with adverse incidents: Secondary | ICD-10-CM | POA: Diagnosis not present

## 2019-11-17 DIAGNOSIS — I4891 Unspecified atrial fibrillation: Secondary | ICD-10-CM | POA: Diagnosis not present

## 2019-11-17 DIAGNOSIS — I252 Old myocardial infarction: Secondary | ICD-10-CM | POA: Diagnosis not present

## 2019-11-17 DIAGNOSIS — Z96641 Presence of right artificial hip joint: Secondary | ICD-10-CM | POA: Diagnosis not present

## 2019-11-17 DIAGNOSIS — J449 Chronic obstructive pulmonary disease, unspecified: Secondary | ICD-10-CM | POA: Insufficient documentation

## 2019-11-17 DIAGNOSIS — Z79899 Other long term (current) drug therapy: Secondary | ICD-10-CM | POA: Insufficient documentation

## 2019-11-17 LAB — URINALYSIS, ROUTINE W REFLEX MICROSCOPIC
Bilirubin Urine: NEGATIVE
Glucose, UA: NEGATIVE mg/dL
Ketones, ur: NEGATIVE mg/dL
Nitrite: POSITIVE — AB
Protein, ur: 100 mg/dL — AB
RBC / HPF: >50 RBC/hpf — ABNORMAL HIGH (ref 0–5)
Specific Gravity, Urine: 1.013 (ref 1.005–1.030)
WBC, UA: >50 WBC/hpf — ABNORMAL HIGH (ref 0–5)
pH: 8 (ref 5.0–8.0)

## 2019-11-17 NOTE — Discharge Instructions (Signed)
Call your urologist tomorrow and let them know that you had a another issue with your catheter.  Have them look at your urinalysis and culture and see if they want to start you on any antibiotics.  Please return for fever or flank pain or if you have recurrent difficulties with your catheter.

## 2019-11-17 NOTE — ED Provider Notes (Signed)
[facility] DEPT Provider Note   CSN: [id number] Arrival date & time: 11/17/19  1443     History Chief Complaint  Patient presents with  . Suprapubic Catheter Problem    Joe Durham is a 74 y.o. male.  74 yo M with decreased output from his suprapubic catheter.  This was noticed this morning at about 10 AM.  Felt like some was leaking around the bag.  Denies fevers or flank pain.  Denies change in consistency to his urine.  Had his catheter flushed prior to our discussion he feels completely better.  States that is now draining easily into the bag.  Denies any suprapubic tenderness.  He feels the only thing that was giving him from having issues was the Bactrim he was on.  He would like this refilled.  The history is provided by the patient.  Illness Severity:  Moderate Onset quality:  Gradual Duration:  5 hours Timing:  Constant Progression:  Resolved Chronicity:  Recurrent Associated symptoms: no abdominal pain, no chest pain, no congestion, no diarrhea, no fever, no headaches, no myalgias, no rash, no shortness of breath and no vomiting        Past Medical History:  Diagnosis Date  . Atrial fibrillation ([place])   . Bladder calculi   . COPD (chronic obstructive pulmonary disease) ([place])   . DOE (dyspnea on exertion)   . Elevated blood pressure reading    12-22-2018 reprots he hasnt had his BP meds since this past thursday   . Foley catheter in place   . History of bladder stone   . History of embolic stroke 21/1941   right PCA emobolism ischemic infarct (cyptogenic)  s/p LOOP recorder 01-05-2015;  07-08-2018 per pt no residual  . History of urinary retention   . Hypertension   . Mobitz type 1 second degree atrioventricular block   . Myocardial infarction [facility]) 2019   non stemi  . Osteoarthritis   . PAD (peripheral artery disease) ([place])   . Paroxysmal atrial tachycardia ([place])   . Peripheral vascular disease ([place])    last duplex  09-25-2016 bilateral ABI 0.6 and occulsion of  right popiteal posterior tibials and left peroneal  . Presence of suprapubic catheter ([place])   . Stroke ([place])   . Wears glasses     Patient Active Problem List   Diagnosis Date Noted  . Claudication in peripheral vascular disease ([place]) 09/14/2019  . Incarcerated left inguinal hernia 08/16/2019  . Long term current use of anticoagulant therapy 06/02/2019  . COPD (chronic obstructive pulmonary disease) ([place]) 06/01/2019  . B12 deficiency 05/04/2019  . Left inguinal hernia 05/04/2019  . Cough   . Anemia   . Bacteremia due to Gram-negative bacteria 05/02/2019  . Sepsis ([place]) 05/01/2019  . New onset a-fib ([place]) 05/01/2019  . Pre-operative clearance 12/15/2018  . 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 NSTEMI (non-ST elevated myocardial infarction) ([place]) 09/03/2018  . Malnutrition of moderate degree 09/03/2018  . History of loop recorder 01/23/2018  . Mobitz type 1 second degree AV block 07/30/2017  . PAD (peripheral artery disease) ([place]) 07/30/2017  . Bladder calculi 07/15/2017  . UTI (urinary tract infection) 06/13/2017  . Acute cystitis with hematuria   . Gross hematuria 06/12/2017  . Renal insufficiency 06/12/2017  . Complicated UTI (urinary tract infection) 06/12/2017  . Acute pyelonephritis 06/12/2017  . Uncomplicated alcohol dependence ([place])   . AKI (acute kidney injury) ([place])   . Claudication ([place]) 11/14/2016  .  Encounter for loop recorder check 07/19/2015  . First degree AV block 07/19/2015  . PAT (paroxysmal atrial tachycardia) (HCC) 07/19/2015  . Essential hypertension 04/05/2015  . Cocaine abuse ([facility]) 04/05/2015  . Hospital discharge follow-up 01/10/2015  . 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 Lung mass   . Acute embolic stroke ([facility]) 01/04/2015  . Cerebral infarction due to unspecified mechanism   . Visual changes   . Substance abuse (H[facility])   . Dyslipidemia, goal LDL below 70   .  History of stroke 01/03/2015  . Tobacco abuse 04/12/2014  . Bladder stones 04/12/2014    Past Surgical History:  Procedure Laterality Date  . ABDOMINAL AORTOGRAM W/LOWER EXTREMITY Bilateral 09/14/2019   Procedure: ABDOMINAL AORTOGRAM W/LOWER EXTREMITY;  Surgeon: Runell Gess, MD;  Location: [facility] INVASIVE CV LAB;  Service: Cardiovascular;  Laterality: Bilateral;  . CORONARY ARTERY BYPASS GRAFT N/A 09/05/2018   Procedure: CORONARY ARTERY BYPASS GRAFTING (CABG) times 3 using left internal mammary artery and right greater saphenous vein harvested endoscopically.;  Surgeon: Kerin Perna, MD;  Location: [facility] OR;  Service: Open Heart Surgery;  Laterality: N/A;  . CYSTOSCOPY WITH LITHOLAPAXY N/A 07/15/2017   Procedure: OPEN CYSTOLITHOTOMY;  Surgeon: Crista Elliot, MD;  Location: [facility] ORS;  Service: Urology;  Laterality: N/A;  . INGUINAL HERNIA REPAIR Right 2011  . INSERTION OF SUPRAPUBIC CATHETER N/A 07/15/2017   Procedure: INSERTION OF SUPRAPUBIC CATHETER;  Surgeon: Crista Elliot, MD;  Location: [facility] ORS;  Service: Urology;  Laterality: N/A;  . LEFT HEART CATH AND CORONARY ANGIOGRAPHY N/A 09/03/2018   Procedure: LEFT HEART CATH AND CORONARY ANGIOGRAPHY;  Surgeon: Iran Ouch, MD;  Location: [facility] INVASIVE CV LAB;  Service: Cardiovascular;  Laterality: N/A;  . LOOP RECORDER IMPLANT N/A 01/05/2015   Procedure: LOOP RECORDER IMPLANT;  Surgeon: Thurmon Fair, MD;  Location: [facility] CATH LAB;  Service: Cardiovascular;  Laterality: N/A;  . ORIF WRIST FRACTURE Left 03/20/2014   Procedure: OPEN REDUCTION INTERNAL FIXATION (ORIF) WRIST FRACTURE;  Surgeon: Dominica Severin, MD;  Location: [facility] OR;  Service: Orthopedics;  Laterality: Left;  . TEE WITHOUT CARDIOVERSION N/A 01/05/2015   Procedure: TRANSESOPHAGEAL ECHOCARDIOGRAM (TEE)/LOOP;  Surgeon: Thurmon Fair, MD;  Location: [facility] ENDOSCOPY;  Service: Cardiovascular;  Laterality: N/A; ef 60-65%, no LA or RA thrombus, trivial PR, mild TR  . TEE WITHOUT  CARDIOVERSION N/A 09/05/2018   Procedure: TRANSESOPHAGEAL ECHOCARDIOGRAM (TEE);  Surgeon: Donata Clay, Theron Arista, MD;  Location: [facility] OR;  Service: Open Heart Surgery;  Laterality: N/A;  . TONSILLECTOMY  child  . TOTAL HIP ARTHROPLASTY Right 2000       Family History  Problem Relation Age of Onset  . Heart attack Sister     Social History   Tobacco Use  . Smoking status: Current Every Day Smoker    Packs/day: 0.25    Years: 52.00    Pack years: 13.00    Types: Cigarettes  . Smokeless tobacco: Never Used  . Tobacco comment: 07-08-2018 pt is down to 1ppw from 1 ppd, 12-22-2018 about  half a pack per day   Substance Use Topics  . Alcohol use: Yes    Alcohol/week: 0.0 standard drinks    Comment: 08/2018 ! drink 1 pint a day 3 times a week on average  . Drug use: No    Comment: history of cocaine ; last use 7 months ago     Home Medications Prior to Admission medications   Medication Sig  Start Date End Date Taking? Authorizing Provider  acetaminophen (TYLENOL) 325 MG tablet Take 2 tablets (650 mg total) by mouth every 4 (four) hours as needed for headache or mild pain. 09/15/19   Leone Brand, NP  apixaban (ELIQUIS) 5 MG TABS tablet Take 1 tablet (5 mg total) by mouth 2 (two) times daily. 05/06/19   Regalado, Belkys A, MD  atorvastatin (LIPITOR) 80 MG tablet Take 1 tablet (80 mg total) by mouth daily. 12/15/18   Abelino Derrick, PA-C  carvedilol (COREG) 6.25 MG tablet Take 6.25 mg by mouth 2 (two) times daily with a meal.    [provider]  cilostazol (PLETAL) 50 MG tablet Take 1 tablet (50 mg total) by mouth 2 (two) times daily. 09/15/19   Leone Brand, NP  lisinopril (ZESTRIL) 2.5 MG tablet Take 1 tablet (2.5 mg total) by mouth daily. 12/15/18   Kilroy, Eda Paschal, PA-C  mometasone-formoterol (DULERA) 100-5 MCG/ACT AERO Inhale 2 puffs into the lungs 2 (two) times daily. Patient taking differently: Inhale 2 puffs into the lungs 2 (two) times daily as needed for wheezing.  05/06/19    Regalado, Belkys A, MD  pantoprazole (PROTONIX) 40 MG tablet Take 1 tablet (40 mg total) by mouth daily at 6 (six) AM. 05/07/19   Regalado, Belkys A, MD  umeclidinium bromide (INCRUSE ELLIPTA) 62.5 MCG/INH AEPB Inhale 1 puff into the lungs daily. 05/07/19   Regalado, Belkys A, MD  vitamin B-12 (CYANOCOBALAMIN) 100 MCG tablet Take 1 tablet (100 mcg total) by mouth daily. 05/06/19 05/05/20  Regalado, Jon Billings A, MD    Allergies    Lactose intolerance (gi)  Review of Systems   Review of Systems  Constitutional: Negative for chills and fever.  HENT: Negative for congestion and facial swelling.   Eyes: Negative for discharge and visual disturbance.  Respiratory: Negative for shortness of breath.   Cardiovascular: Negative for chest pain and palpitations.  Gastrointestinal: Negative for abdominal pain, diarrhea and vomiting.  Genitourinary: Positive for decreased urine volume and difficulty urinating.  Musculoskeletal: Negative for arthralgias and myalgias.  Skin: Negative for color change and rash.  Neurological: Negative for tremors, syncope and headaches.  Psychiatric/Behavioral: Negative for confusion and dysphoric mood.    Physical Exam Updated Vital Signs BP (!) 168/109   Pulse 77   Temp 97.9 F (36.6 C) (Oral)   Resp 16   SpO2 99%   Physical Exam Vitals and nursing note reviewed.  Constitutional:      Appearance: He is well-developed.  HENT:     Head: Normocephalic and atraumatic.  Eyes:     Pupils: Pupils are equal, round, and reactive to light.  Neck:     Vascular: No JVD.  Cardiovascular:     Rate and Rhythm: Normal rate and regular rhythm.     Heart sounds: No murmur. No friction rub. No gallop.   Pulmonary:     Effort: No respiratory distress.     Breath sounds: No wheezing.  Abdominal:     General: There is no distension.     Tenderness: There is no abdominal tenderness. There is no guarding or rebound.     Comments: Suprapubic catheter.  No erythema or drainage from  the site.  No tenderness about the abdomen.  Musculoskeletal:        General: Normal range of motion.     Cervical back: Normal range of motion and neck supple.  Skin:    Coloration: Skin is not pale.  Findings: No rash.  Neurological:     Mental Status: He is alert and oriented to person, place, and time.  Psychiatric:        Behavior: Behavior normal.     ED Results / Procedures / Treatments   Labs (all labs ordered are listed, but only abnormal results are displayed) Labs Reviewed  URINE CULTURE  URINALYSIS, ROUTINE W REFLEX MICROSCOPIC    EKG None  Radiology No results found.  Procedures Procedures (including critical care time)  Medications Ordered in ED Medications - No data to display  ED Course  I have reviewed the triage vital signs and the nursing notes.  Pertinent labs & imaging results that were available during my care of the patient were reviewed by me and considered in my medical decision making (see chart for details).    MDM Rules/Calculators/A&P                      74 yo M with a chief complaints of decreased urinary output from his superpubic catheter.  This was fixed prior to my exam.  He had his catheter flushed.  Feels much better and would like to go home.  He also would like to have his antibiotics refilled.  He is not showing any other signs concerning for urinary tract infection.  I would hold off on antibiotics at this time.  I discussed my reasoning with him for this.  Suggested that he call his urologist and have them evaluate the urine studies and then decide if he needed to have continued antibiotic use.  3:43 PM:  I have discussed the diagnosis/risks/treatment options with the patient and believe the pt to be eligible for discharge home to follow-up with Urology. We also discussed returning to the ED immediately if new or worsening sx occur. We discussed the sx which are most concerning (e.g., sudden worsening pain, fever, inability to  tolerate by mouth) that necessitate immediate return. Medications administered to the patient during their visit and any new prescriptions provided to the patient are listed below.  Medications given during this visit Medications - No data to display   The patient appears reasonably screen and/or stabilized for discharge and I doubt any other medical condition or other [facility] requiring further screening, evaluation, or treatment in the ED at this time prior to discharge.   Final Clinical Impression(s) / ED Diagnoses Final diagnoses:  Suprapubic catheter dysfunction, initial encounter [facility])    Rx / DC Orders ED Discharge Orders    None       Melene Plan, DO 11/17/19 1543

## 2019-11-17 NOTE — ED Triage Notes (Signed)
EMS reports from home, Pt c/o pain and pressure pelvic and lower abdominal at suprapubic catheter site. States he is not getting any output into bag and feels pressure and burning.  BP 160/100 HR 80 RR 18 Sp02 99 RA

## 2019-11-20 LAB — URINE CULTURE: Culture: >=100000 — AB

## 2019-11-21 ENCOUNTER — Telehealth: Payer: Self-pay | Admitting: *Deleted

## 2019-11-21 NOTE — Progress Notes (Signed)
ED Antimicrobial Stewardship Positive Culture Follow Up   Joe Durham is an 73 y.o. male who presented to [facility] on 11/17/2019 with a chief complaint of  Chief Complaint  Patient presents with  . Suprapubic Catheter Problem    Recent Results (from the past 720 hour(s))  Urine culture     Status: Abnormal   Collection Time: 11/17/19  3:35 PM   Specimen: Urine, Catheterized  Result Value Ref Range Status   Specimen Description   Final    URINE, CATHETERIZED Performed at [facility], [address], [place], [place] [address]    Special Requests   Final    NONE Performed at [facility], [address], [place], [place] [address]    Culture >=100,000 COLONIES/mL PROVIDENCIA RETTGERI (A)  Final   Report Status 11/20/2019 FINAL  Final   Organism ID, Bacteria PROVIDENCIA RETTGERI (A)  Final      Susceptibility   Providencia rettgeri - MIC*    AMPICILLIN RESISTANT Resistant     CEFAZOLIN >=64 RESISTANT Resistant     CEFTRIAXONE <=0.25 SENSITIVE Sensitive     CIPROFLOXACIN <=0.25 SENSITIVE Sensitive     GENTAMICIN <=1 SENSITIVE Sensitive     IMIPENEM 2 SENSITIVE Sensitive     NITROFURANTOIN 256 RESISTANT Resistant     TRIMETH/SULFA >=320 RESISTANT Resistant     AMPICILLIN/SULBACTAM <=2 SENSITIVE Sensitive     PIP/TAZO <=4 SENSITIVE Sensitive     * >=100,000 COLONIES/mL PROVIDENCIA RETTGERI    []  Treated with, organism resistant to prescribed antimicrobial [x]  Patient discharged originally without antimicrobial agent and treatment is now indicated  New antibiotic prescription: Augmentin 875mg  PO BID x7 days  **D/C Bactrim if still taking ED Provider: , PA-C   11/21/2019, 11:49 AM Clinical Pharmacist [phone]

## 2019-11-21 NOTE — Telephone Encounter (Signed)
Post ED Visit - Positive Culture Follow-up: Successful Patient Follow-Up  Culture assessed and recommendations reviewed by:  []  , Pharm.D. []  Enzo Bi, Pharm.D., BCPS AQ-ID []  , Pharm.D., BCPS []  Celedonio Miyamoto, Pharm.D., BCPS []  [place], Garvin Fila.D., BCPS, AAHIVP []  , Pharm.D., BCPS, AAHIVP []  Georgina Pillion, PharmD, BCPS []  , PharmD, BCPS []  [place], PharmD, BCPS []  [place], PharmD  Positive urine culture  [x]  Patient discharged without antimicrobial prescription and treatment is now indicated []  Organism is resistant to prescribed ED discharge antimicrobial []  Patient with positive blood cultures  Changes discussed with ED provider, , PA New antibiotic prescription Augmentin 875 mg PO BID x 7 days Called to [place], [phone]  Contacted patient, date 11/21/2019, time 1215   Phillips Climes 11/21/2019, 12:17 PM

## 2019-12-01 ENCOUNTER — Ambulatory Visit: Payer: Medicare Other | Admitting: Nurse Practitioner

## 2020-01-11 ENCOUNTER — Encounter (HOSPITAL_COMMUNITY): Payer: Self-pay | Admitting: Emergency Medicine

## 2020-01-11 ENCOUNTER — Other Ambulatory Visit: Payer: Self-pay

## 2020-01-11 ENCOUNTER — Emergency Department (HOSPITAL_COMMUNITY)
Admission: EM | Admit: 2020-01-11 | Discharge: 2020-01-11 | Disposition: A | Discharge status: Home / Self Care | Payer: Medicare Other | Attending: Emergency Medicine | Admitting: Emergency Medicine

## 2020-01-11 DIAGNOSIS — J449 Chronic obstructive pulmonary disease, unspecified: Secondary | ICD-10-CM | POA: Insufficient documentation

## 2020-01-11 DIAGNOSIS — I252 Old myocardial infarction: Secondary | ICD-10-CM | POA: Insufficient documentation

## 2020-01-11 DIAGNOSIS — T83010A Breakdown (mechanical) of cystostomy catheter, initial encounter: Secondary | ICD-10-CM | POA: Diagnosis not present

## 2020-01-11 DIAGNOSIS — F17210 Nicotine dependence, cigarettes, uncomplicated: Secondary | ICD-10-CM | POA: Insufficient documentation

## 2020-01-11 DIAGNOSIS — Z951 Presence of aortocoronary bypass graft: Secondary | ICD-10-CM | POA: Diagnosis not present

## 2020-01-11 DIAGNOSIS — Z95818 Presence of other cardiac implants and grafts: Secondary | ICD-10-CM | POA: Diagnosis not present

## 2020-01-11 DIAGNOSIS — I10 Essential (primary) hypertension: Secondary | ICD-10-CM | POA: Insufficient documentation

## 2020-01-11 DIAGNOSIS — Z79899 Other long term (current) drug therapy: Secondary | ICD-10-CM | POA: Diagnosis not present

## 2020-01-11 DIAGNOSIS — T83198A Other mechanical complication of other urinary devices and implants, initial encounter: Secondary | ICD-10-CM | POA: Diagnosis present

## 2020-01-11 DIAGNOSIS — Z96641 Presence of right artificial hip joint: Secondary | ICD-10-CM | POA: Diagnosis not present

## 2020-01-11 DIAGNOSIS — Z8673 Personal history of transient ischemic attack (TIA), and cerebral infarction without residual deficits: Secondary | ICD-10-CM | POA: Insufficient documentation

## 2020-01-11 DIAGNOSIS — Y731 Therapeutic (nonsurgical) and rehabilitative gastroenterology and urology devices associated with adverse incidents: Secondary | ICD-10-CM | POA: Insufficient documentation

## 2020-01-11 DIAGNOSIS — Z7901 Long term (current) use of anticoagulants: Secondary | ICD-10-CM | POA: Insufficient documentation

## 2020-01-11 NOTE — Discharge Instructions (Addendum)
See the surgeon for evaluation of hernia

## 2020-01-11 NOTE — ED Provider Notes (Signed)
[facility]-EMERGENCY DEPT Provider Note   CSN: [id number] Arrival date & time: 01/11/20  1519     History Chief Complaint  Patient presents with  . Catheter Removed    Joe Durham is a 74 y.o. male.  Pt reports suprapubic catheter came out.  Pt reports it was replaced about a month ago.  Pt denies any other complaints. Pt denies any abdominal pain.  No urinary symptoms.  No fever,  No damage to stoma   The history is provided by the patient. No language interpreter was used.       Past Medical History:  Diagnosis Date  . Atrial fibrillation (HCC)   . Bladder calculi   . COPD (chronic obstructive pulmonary disease) (HCC)   . DOE (dyspnea on exertion)   . Elevated blood pressure reading    12-22-2018 reprots he hasnt had his BP meds since this past thursday   . Foley catheter in place   . History of bladder stone   . 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 History of urinary retention   . Hypertension   . Mobitz type 1 second degree atrioventricular block   . Myocardial infarction [facility]) 2019   non stemi  . Osteoarthritis   . PAD (peripheral artery disease) (HCC)   . Paroxysmal atrial tachycardia (HCC)   . Peripheral vascular disease (HCC)    last duplex 09-25-2016 bilateral ABI 0.6 and occulsion of  right popiteal posterior tibials and left peroneal  . Presence of suprapubic catheter (HCC)   . Stroke (HCC)   . Wears glasses     Patient Active Problem List   Diagnosis Date Noted  . Claudication in peripheral vascular disease (HCC) 09/14/2019  . Incarcerated left inguinal hernia 08/16/2019  . Long term current use of anticoagulant therapy 06/02/2019  . COPD (chronic obstructive pulmonary disease) (HCC) 06/01/2019  . B12 deficiency 05/04/2019  . Left inguinal hernia 05/04/2019  . Cough   . Anemia   . Bacteremia due to  Gram-negative bacteria 05/02/2019  . Sepsis (HCC) 05/01/2019  . New onset a-fib (HCC) 05/01/2019  . Pre-operative clearance 12/15/2018  . 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 NSTEMI (non-ST elevated myocardial infarction) (HCC) 09/03/2018  . Malnutrition of moderate degree 09/03/2018  . History of loop recorder 01/23/2018  . Mobitz type 1 second degree AV block 07/30/2017  . PAD (peripheral artery disease) (HCC) 07/30/2017  . Bladder calculi 07/15/2017  . UTI (urinary tract infection) 06/13/2017  . Acute cystitis with hematuria   . Gross hematuria 06/12/2017  . Renal insufficiency 06/12/2017  . Complicated UTI (urinary tract infection) 06/12/2017  . Acute pyelonephritis 06/12/2017  . Uncomplicated alcohol dependence (HCC)   . AKI (acute kidney injury) (HCC)   . Claudication (HCC) 11/14/2016  . Encounter for loop recorder check 07/19/2015  . First degree AV block 07/19/2015  . PAT (paroxysmal atrial tachycardia) (HCC) 07/19/2015  . Essential hypertension 04/05/2015  . Cocaine abuse (HCC) 04/05/2015  . Hospital discharge follow-up 01/10/2015  . 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 Lung mass   . Acute embolic stroke (HCC) 01/04/2015  . Cerebral infarction due to unspecified mechanism   . Visual changes   . Substance abuse (HCC)   . Dyslipidemia, goal LDL below 70   . History of stroke 01/03/2015  . Tobacco abuse  04/12/2014  . Bladder stones 04/12/2014    Past Surgical History:  Procedure Laterality Date  . ABDOMINAL AORTOGRAM W/LOWER EXTREMITY Bilateral 09/14/2019   Procedure: ABDOMINAL AORTOGRAM W/LOWER EXTREMITY;  Surgeon: Runell Gess, MD;  Location: [facility] INVASIVE CV LAB;  Service: Cardiovascular;  Laterality: Bilateral;  . CORONARY ARTERY BYPASS GRAFT N/A 09/05/2018   Procedure: CORONARY ARTERY BYPASS GRAFTING (CABG) times 3 using left internal mammary artery and right greater saphenous vein harvested endoscopically.;  Surgeon:  Kerin Perna, MD;  Location: [facility] OR;  Service: Open Heart Surgery;  Laterality: N/A;  . CYSTOSCOPY WITH LITHOLAPAXY N/A 07/15/2017   Procedure: OPEN CYSTOLITHOTOMY;  Surgeon: Crista Elliot, MD;  Location: [facility] ORS;  Service: Urology;  Laterality: N/A;  . INGUINAL HERNIA REPAIR Right 2011  . INSERTION OF SUPRAPUBIC CATHETER N/A 07/15/2017   Procedure: INSERTION OF SUPRAPUBIC CATHETER;  Surgeon: Crista Elliot, MD;  Location: [facility] ORS;  Service: Urology;  Laterality: N/A;  . LEFT HEART CATH AND CORONARY ANGIOGRAPHY N/A 09/03/2018   Procedure: LEFT HEART CATH AND CORONARY ANGIOGRAPHY;  Surgeon: Iran Ouch, MD;  Location: [facility] INVASIVE CV LAB;  Service: Cardiovascular;  Laterality: N/A;  . LOOP RECORDER IMPLANT N/A 01/05/2015   Procedure: LOOP RECORDER IMPLANT;  Surgeon: Thurmon Fair, MD;  Location: [facility] CATH LAB;  Service: Cardiovascular;  Laterality: N/A;  . ORIF WRIST FRACTURE Left 03/20/2014   Procedure: OPEN REDUCTION INTERNAL FIXATION (ORIF) WRIST FRACTURE;  Surgeon: Dominica Severin, MD;  Location: [facility] OR;  Service: Orthopedics;  Laterality: Left;  . TEE WITHOUT CARDIOVERSION N/A 01/05/2015   Procedure: TRANSESOPHAGEAL ECHOCARDIOGRAM (TEE)/LOOP;  Surgeon: Thurmon Fair, MD;  Location: [facility] ENDOSCOPY;  Service: Cardiovascular;  Laterality: N/A; ef 60-65%, no LA or RA thrombus, trivial PR, mild TR  . TEE WITHOUT CARDIOVERSION N/A 09/05/2018   Procedure: TRANSESOPHAGEAL ECHOCARDIOGRAM (TEE);  Surgeon: Donata Clay, Theron Arista, MD;  Location: [facility] OR;  Service: Open Heart Surgery;  Laterality: N/A;  . TONSILLECTOMY  child  . TOTAL HIP ARTHROPLASTY Right 2000       Family History  Problem Relation Age of Onset  . Heart attack Sister     Social History   Tobacco Use  . Smoking status: Current Every Day Smoker    Packs/day: 0.25    Years: 52.00    Pack years: 13.00    Types: Cigarettes  . Smokeless tobacco: Never Used  . Tobacco comment: 07-08-2018 pt is down to 1ppw from 1 ppd, 12-22-2018  about  half a pack per day   Substance Use Topics  . Alcohol use: Yes    Alcohol/week: 0.0 standard drinks    Comment: 08/2018 ! drink 1 pint a day 3 times a week on average  . Drug use: No    Comment: history of cocaine ; last use 7 months ago     Home Medications Prior to Admission medications   Medication Sig Start Date End Date Taking? Authorizing Provider  acetaminophen (TYLENOL) 325 MG tablet Take 2 tablets (650 mg total) by mouth every 4 (four) hours as needed for headache or mild pain. 09/15/19   Leone Brand, NP  apixaban (ELIQUIS) 5 MG TABS tablet Take 1 tablet (5 mg total) by mouth 2 (two) times daily. 05/06/19   Regalado, Belkys A, MD  atorvastatin (LIPITOR) 80 MG tablet Take 1 tablet (80 mg total) by mouth daily. 12/15/18   Abelino Derrick, PA-C  carvedilol (COREG) 6.25 MG tablet Take 6.25 mg by mouth 2 (two) times daily  with a meal.    [provider]  cilostazol (PLETAL) 50 MG tablet Take 1 tablet (50 mg total) by mouth 2 (two) times daily. 09/15/19   Isaiah Serge, NP  lisinopril (ZESTRIL) 2.5 MG tablet Take 1 tablet (2.5 mg total) by mouth daily. 12/15/18   Kilroy, Doreene Burke, PA-C  mometasone-formoterol (DULERA) 100-5 MCG/ACT AERO Inhale 2 puffs into the lungs 2 (two) times daily. Patient taking differently: Inhale 2 puffs into the lungs 2 (two) times daily as needed for wheezing.  05/06/19   Regalado, Belkys A, MD  pantoprazole (PROTONIX) 40 MG tablet Take 1 tablet (40 mg total) by mouth daily at 6 (six) AM. 05/07/19   Regalado, Belkys A, MD  umeclidinium bromide (INCRUSE ELLIPTA) 62.5 MCG/INH AEPB Inhale 1 puff into the lungs daily. 05/07/19   Regalado, Belkys A, MD  vitamin B-12 (CYANOCOBALAMIN) 100 MCG tablet Take 1 tablet (100 mcg total) by mouth daily. 05/06/19 05/05/20  Regalado, Jerald Kief A, MD    Allergies    Lactose intolerance (gi)  Review of Systems   Review of Systems  All other systems reviewed and are negative.   Physical Exam Updated Vital Signs BP (!)  156/107   Pulse 85   Temp 98 F (36.7 C) (Oral)   Resp 16   SpO2 100%   Physical Exam Vitals reviewed.  HENT:     Head: Normocephalic.  Cardiovascular:     Rate and Rhythm: Normal rate.     Pulses: Normal pulses.  Pulmonary:     Effort: Pulmonary effort is normal.  Abdominal:     General: Abdomen is flat.     Comments: Stoma looks good no infection   Musculoskeletal:        General: Normal range of motion.  Skin:    General: Skin is warm.  Neurological:     General: No focal deficit present.  Psychiatric:        Mood and Affect: Mood normal.     ED Results / Procedures / Treatments   Labs (all labs ordered are listed, but only abnormal results are displayed) Labs Reviewed - No data to display  EKG None  Radiology No results found.  Procedures SUPRAPUBIC TUBE PLACEMENT  Date/Time: 01/11/2020 4:38 PM Performed by: Fransico Meadow, PA-C Authorized by: Fransico Meadow, PA-C   Consent:    Consent obtained:  Verbal   Consent given by:  Patient   Risks discussed:  Bleeding and bowel perforation   Alternatives discussed:  No treatment Anesthesia (see MAR for exact dosages):    Anesthesia method:  None Procedure details:    Complexity:  Simple   Catheter size:  16 Fr   Ultrasound guidance: no     Number of attempts:  1   Urine characteristics:  Clear Post-procedure details:    Patient tolerance of procedure:  Tolerated well, no immediate complications   (including critical care time)  Medications Ordered in ED Medications - No data to display  ED Course  I have reviewed the triage vital signs and the nursing notes.  Pertinent labs & imaging results that were available during my care of the patient were reviewed by me and considered in my medical decision making (see chart for details).    MDM Rules/Calculators/A&P                      MDM:   Pt reports relief.  Pt advsied to follow up with surgeon for hernia as  scheduled An After Visit Summary was  printed and given to the patient.  Final Clinical Impression(s) / ED Diagnoses Final diagnoses:  Suprapubic catheter dysfunction, initial encounter [facility])    Rx / DC Orders ED Discharge Orders    None       Osie Cheeks 01/11/20 1640    Melene Plan, DO 01/12/20 1642

## 2020-01-11 NOTE — ED Triage Notes (Signed)
Pt states "I woke up wet and my catheter was out." Pt suprapubic catheter not intact.

## 2020-01-14 ENCOUNTER — Ambulatory Visit: Payer: Self-pay | Admitting: General Surgery

## 2020-01-14 ENCOUNTER — Telehealth: Payer: Self-pay | Admitting: *Deleted

## 2020-01-14 NOTE — H&P (View-Only) (Signed)
Joe Durham Documented: 01/14/2020 2:03 PM Location: [facility] Patient #: [id number] DOB: 02/06/1946 Single / Language: English / Race: Black or African American Male  History of Present Illness (Joe Durham M. Joe Castro Joe Durham; 01/14/2020 2:45 PM) The patient is a 73 year old male who presents with an inguinal hernia. He comes in today to rediscuss inguinal hernia surgery. He saw one of my partners last summer and was actually scheduled to have his chronically incarcerated lifting or hernia repaired but surgery was canceled twice because patient had transportation issues and could not get to the hospital. He states his transportation issues is more secure otherwise he denies any medical changes. His left hernia still bother him. It tends to bother her more in the evening after he is been on his feet. He states that the area will get very swollen during the day but we'll decrease in size when he lays down. He denies any nausea, vomiting, diarrhea or constipation. He has a chronic suprapubic catheter. He has a history of CVA a few years ago. He denies any current TIAs or amaurosis fugax. He is on an antiplatelet medication. He continues to smoke a few cigarettes a day. He denies any chest pain or chest pressure or chest tightness.   Problem List/Past Medical (Joe Durham M. 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 Joe Domke, Joe Durham; 01/14/2020 2:46 PM) Eliquis (5MG Tablet, Oral) Active. Atorvastatin Calcium (80MG Tablet, Oral) Active. Carvedilol (6.25MG Tablet, Oral)  Active. Cilostazol (50MG Tablet, Oral) Active. Lisinopril (2.5MG Tablet, Oral) Active. Dulera (100-5MCG/ACT Aerosol, Inhalation) Active. Pantoprazole Sodium (40MG Tablet DR, Oral) Active. Umeclidinium Bromide (62.5MCG/INH Aero Pow Br Act, Inhalation) Active. B-12 (100MCG Tablet, Oral) Active. Medications Reconciled Incruse Ellipta (62.5MCG/INH Aero Pow Br Act, Inhalation) Active.  Social History (Joe Durham M. Joe Wnek, Joe Durham; 01/14/2020 2:46 PM) Alcohol use Moderate alcohol use. Caffeine use Carbonated beverages. Illicit drug use Remotely quit drug use. Tobacco use Current every day smoker.  Other Problems (Joe Durham M. Joe Clemons, Joe Durham; 01/14/2020 2:46 PM) Atrial Fibrillation Gastric Ulcer High blood pressure Inguinal Hernia     Review of Systems (Joe Durham M. Joe Akter Joe Durham; 01/14/2020 2:44 PM) General Present- Appetite Loss. Not Present- Chills, Fatigue, Fever, Night Sweats, Weight Gain and Weight Loss. Respiratory Present- Difficulty Breathing. Not Present- Bloody sputum, Chronic Cough, Snoring and Wheezing. Breast Not Present- Breast Mass, Breast Pain, Nipple Discharge and Skin Changes. Cardiovascular Present- Leg Cramps, Rapid Heart Rate, Shortness of Breath and Swelling of Extremities. Not Present- Chest Pain, Difficulty Breathing Lying Down and Palpitations. Gastrointestinal Present- Gets full quickly at meals and Indigestion. Not Present- Abdominal Pain, Bloating, Bloody Stool, Change in Bowel Habits, Chronic diarrhea, Constipation, Difficulty Swallowing, Excessive gas, Hemorrhoids, Nausea, Rectal Pain and Vomiting. Musculoskeletal Present- Joint Stiffness and Swelling of Extremities. Not Present- Back Pain, Joint Pain, Muscle Pain and Muscle Weakness. Neurological Present- Numbness, Trouble walking and Weakness. Not Present- Decreased Memory, Fainting, Headaches, Seizures, Tingling and Tremor. Psychiatric Present- Fearful. Not Present- Anxiety, Bipolar, Change in Sleep Pattern, Depression  and Frequent crying. All other systems negative  Vitals (Joe Durham Joe Durham; 01/14/2020 2:06 PM) 01/14/2020 2:05 PM Weight: 123.38 lb Height: 66in Body Surface Area: 1.63 m Body Mass Index: 19.91 kg/m  Temp.: 97.9F(Tympanic)    Pulse: 37 (Regular)  P.OX: 99% (Room air) BP: 124/84 (Sitting, Left Arm, Standard)        Physical Exam Joe Durham M. Joe Tieszen Joe Durham; 01/14/2020 2:44 PM)  General Mental Status-Alert. General Appearance-Consistent with stated age. Hydration-Well hydrated. Voice-Normal. Note: uses cane; older than stated age; thin  Head and Neck Head-normocephalic, atraumatic with no lesions or palpable masses. Trachea-midline. Thyroid Gland Characteristics - normal size and consistency.  Eye Eyeball - Bilateral-Extraocular movements intact. Sclera/Conjunctiva - Bilateral-No scleral icterus.  Chest and Lung Exam Chest and lung exam reveals -quiet, even and easy respiratory effort with no use of accessory muscles and on auscultation, normal breath sounds, no adventitious sounds and normal vocal resonance. Inspection Chest Wall - Normal. Back - normal.  Breast - Did not examine.  Cardiovascular Cardiovascular examination reveals -normal heart sounds, regular rate and rhythm with no murmurs and normal pedal pulses bilaterally.  Abdomen Inspection Inspection of the abdomen reveals - No Hernias. Skin - Scar - no surgical scars. Palpation/Percussion Palpation and Percussion of the abdomen reveal - Soft, Non Tender, No Rebound tenderness, No Rigidity (guarding) and No hepatosplenomegaly. Auscultation Auscultation of the abdomen reveals - Bowel sounds normal.  Male Genitourinary Note: Small lower midline incision. Suprapubic catheter in place. Obvious bulge in left groin. Bulge extends into the scrotum. Partially reducible.. No bulge in right groin  Peripheral Vascular Upper Extremity Palpation - Pulses bilaterally  normal.  Neurologic Neurologic evaluation reveals -alert and oriented x 3 with no impairment of recent or remote memory. Mental Status-Normal.  Neuropsychiatric The patient's mood and affect are described as -normal. Judgment and Insight-insight is appropriate concerning matters relevant to self.  Musculoskeletal Normal Exam - Left-Upper Extremity Strength Normal and Lower Extremity Strength Normal. Normal Exam - Right-Upper Extremity Strength Normal and Lower Extremity Strength Normal.  Lymphatic Head & Neck  General Head & Neck Lymphatics: Bilateral - Description - Normal. Axillary - Did not examine. Femoral & Inguinal - Did not examine.    Assessment & Plan Joe Durham M. Arlis Everly Joe Durham; 01/14/2020 2:46 PM)  INCARCERATED LEFT INGUINAL HERNIA (K40.30) Impression: We discussed the etiology of inguinal hernias. We discussed the signs & symptoms of incarceration & strangulation. We discussed non-operative and operative management.  The patient has elected to proceed with open repair of left incarcerated inguinal hernia with mesh  I described the procedure in detail. The patient was given educational material. We discussed the risks and benefits including but not limited to bleeding, infection, chronic inguinal pain, nerve entrapment, hernia recurrence, mesh complications, hematoma formation, urinary retention, injury to the testicle, numbness in the groin, blood clots, injury to the surrounding structures, and anesthesia risk. We also discussed the typical post operative recovery course, including no heavy lifting for 4-6 weeks. I explained that the likelihood of improvement of their symptoms is good  We did discuss that he is at increased risk for complications compared to the average 72 year old because of his cardiac, CVA history, tobacco use. Discussed that he is at higher risk for bleeding, stroke, hematoma formation.  He had been scheduled twice to have surgery with Dr. Gerrit Friends  but both times were canceled due to transportation issues. He states that his transportation is more secure this year but thinks that afternoon appointments and afternoon surgery would be best  This patient encounter took 33 minutes today to perform the following: take history, perform exam, review outside records, interpret imaging, counsel the patient on their diagnosis and document encounter, findings & plan in the EHR  Current Plans  Pt Education - Pamphlet Given - Hernia Surgery: discussed with patient and provided information. I recommended obtaining preoperative cardiac clearance. I am concerned about the health of the patient and the ability to tolerate the operation. Therefore, we will request clearance by cardiology to better assess operative risk & see if a reevaluation, further workup, etc is needed. Also recommendations on how medications such as for anticoagulation and blood pressure should be managed/held/restarted after surgery.  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 But we did discuss that he would be at increased risk for hematoma formation  Leighton Ruff. Redmond Pulling, Joe Durham, FACS General, Bariatric, & Minimally Invasive Surgery [facility], [place]

## 2020-01-14 NOTE — H&P (Signed)
Joe Durham Documented: 01/14/2020 2:03 PM Location: [facility] Patient #: [id number] DOB: [birth year] Single / Language: [place] / Race: Black or African American Male  History of Present Illness Minerva Areola M. Chaysen Tillman MD; 01/14/2020 2:45 PM) The patient is a 74 year old male who presents with an inguinal hernia. He comes in today to rediscuss inguinal hernia surgery. He saw one of my partners last summer and was actually scheduled to have his chronically incarcerated lifting or hernia repaired but surgery was canceled twice because patient had transportation issues and could not get to the hospital. He states his transportation issues is more secure otherwise he denies any medical changes. His left hernia still bother him. It tends to bother her more in the evening after he is been on his feet. He states that the area will get very swollen during the day but we'll decrease in size when he lays down. He denies any nausea, vomiting, diarrhea or constipation. He has a chronic suprapubic catheter. He has a history of CVA a few years ago. He denies any current TIAs or amaurosis fugax. He is on an antiplatelet medication. He continues to smoke a few cigarettes a day. He denies any chest pain or chest pressure or chest tightness.   Problem List/Past Medical Minerva Areola M. 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 Andrey Campanile, MD; 01/14/2020 2:46 PM) Eliquis (5MG  Tablet, Oral) Active. Atorvastatin Calcium (80MG  Tablet, Oral) Active. Carvedilol (6.25MG  Tablet, Oral)  Active. Cilostazol (50MG  Tablet, Oral) Active. Lisinopril (2.5MG  Tablet, Oral) Active. Dulera (100-5MCG/ACT Aerosol, Inhalation) Active. Pantoprazole Sodium (40MG  Tablet DR, Oral) Active. Umeclidinium Bromide (62.5MCG/INH Aero Pow Br Act, Inhalation) Active. B-12 ( Tablet, Oral) Active. Medications Reconciled Incruse Ellipta (62.5MCG/INH Aero Pow Br Act, Inhalation) Active.  Social History M. , MD; 01/14/2020 2:46 PM) Alcohol use Moderate alcohol use. Caffeine use Carbonated beverages. Illicit drug use Remotely quit drug use. Tobacco use Current every day smoker.  Other Problems Minerva Areola M. Andrey Campanile, MD; 01/14/2020 2:46 PM) Atrial Fibrillation Gastric Ulcer High blood pressure Inguinal Hernia     Review of Systems Minerva Areola M. Pharrell Ledford MD; 01/14/2020 2:44 PM) General Present- Appetite Loss. Not Present- Chills, Fatigue, Fever, Night Sweats, Weight Gain and Weight Loss. Respiratory Present- Difficulty Breathing. Not Present- Bloody sputum, Chronic Cough, Snoring and Wheezing. Breast Not Present- Breast Mass, Breast Pain, Nipple Discharge and Skin Changes. Cardiovascular Present- Leg Cramps, Rapid Heart Rate, Shortness of Breath and Swelling of Extremities. Not Present- Chest Pain, Difficulty Breathing Lying Down and Palpitations. Gastrointestinal Present- Gets full quickly at meals and Indigestion. Not Present- Abdominal Pain, Bloating, Bloody Stool, Change in Bowel Habits, Chronic diarrhea, Constipation, Difficulty Swallowing, Excessive gas, Hemorrhoids, Nausea, Rectal Pain and Vomiting. Musculoskeletal Present- Joint Stiffness and Swelling of Extremities. Not Present- Back Pain, Joint Pain, Muscle Pain and Muscle Weakness. Neurological Present- Numbness, Trouble walking and Weakness. Not Present- Decreased Memory, Fainting, Headaches, Seizures, Tingling and Tremor. Psychiatric Present- Fearful. Not Present- Anxiety, Bipolar, Change in Sleep Pattern, Depression  and Frequent crying. All other systems negative  Vitals (April Staton CMA; 01/14/2020 2:06 PM) 01/14/2020 2:05 PM Weight: 123.38 lb Height: 66in Body Surface Area: 1.63 m Body Mass Index: 19.91 kg/m  Temp.: 97.51F(Tympanic)  Pulse: 37 (Regular)  P.OX: 99% (Room air) BP: 124/84 (Sitting, Left Arm, Standard)        Physical Exam Minerva Areola M. Elisha Mcgruder MD; 01/14/2020 2:44 PM)  General Mental Status-Alert. General Appearance-Consistent with stated age. Hydration-Well hydrated. Voice-Normal. Note: uses cane; older than stated age; thin  Head and Neck Head-normocephalic, atraumatic with no lesions or palpable masses. Trachea-midline. Thyroid Gland Characteristics - normal size and consistency.  Eye Eyeball - Bilateral-Extraocular movements intact. Sclera/Conjunctiva - Bilateral-No scleral icterus.  Chest and Lung Exam Chest and lung exam reveals -quiet, even and easy respiratory effort with no use of accessory muscles and on auscultation, normal breath sounds, no adventitious sounds and normal vocal resonance. Inspection Chest Wall - Normal. Back - normal.  Breast - Did not examine.  Cardiovascular Cardiovascular examination reveals -normal heart sounds, regular rate and rhythm with no murmurs and normal pedal pulses bilaterally.  Abdomen Inspection Inspection of the abdomen reveals - No Hernias. Skin - Scar - no surgical scars. Palpation/Percussion Palpation and Percussion of the abdomen reveal - Soft, Non Tender, No Rebound tenderness, No Rigidity (guarding) and No hepatosplenomegaly. Auscultation Auscultation of the abdomen reveals - Bowel sounds normal.  Male Genitourinary Note: Small lower midline incision. Suprapubic catheter in place. Obvious bulge in left groin. Bulge extends into the scrotum. Partially reducible.. No bulge in right groin  Peripheral Vascular Upper Extremity Palpation - Pulses bilaterally  normal.  Neurologic Neurologic evaluation reveals -alert and oriented x 3 with no impairment of recent or remote memory. Mental Status-Normal.  Neuropsychiatric The patient's mood and affect are described as -normal. Judgment and Insight-insight is appropriate concerning matters relevant to self.  Musculoskeletal Normal Exam - Left-Upper Extremity Strength Normal and Lower Extremity Strength Normal. Normal Exam - Right-Upper Extremity Strength Normal and Lower Extremity Strength Normal.  Lymphatic Head & Neck  General Head & Neck Lymphatics: Bilateral - Description - Normal. Axillary - Did not examine. Femoral & Inguinal - Did not examine.    Assessment & Plan Minerva Areola M. Trayson Stitely MD; 01/14/2020 2:46 PM)  INCARCERATED LEFT INGUINAL HERNIA (K40.30) Impression: We discussed the etiology of inguinal hernias. We discussed the signs & symptoms of incarceration & strangulation. We discussed non-operative and operative management.  The patient has elected to proceed with open repair of left incarcerated inguinal hernia with mesh  I described the procedure in detail. The patient was given educational material. We discussed the risks and benefits including but not limited to bleeding, infection, chronic inguinal pain, nerve entrapment, hernia recurrence, mesh complications, hematoma formation, urinary retention, injury to the testicle, numbness in the groin, blood clots, injury to the surrounding structures, and anesthesia risk. We also discussed the typical post operative recovery course, including no heavy lifting for 4-6 weeks. I explained that the likelihood of improvement of their symptoms is good  We did discuss that he is at increased risk for complications compared to the average 72 year old because of his cardiac, CVA history, tobacco use. Discussed that he is at higher risk for bleeding, stroke, hematoma formation.  He had been scheduled twice to have surgery with Dr. Gerrit Friends  but both times were canceled due to transportation issues. He states that his transportation is more secure this year but thinks that afternoon appointments and afternoon surgery would be best  This patient encounter took 33 minutes today to perform the following: take history, perform exam, review outside records, interpret imaging, counsel the patient on their diagnosis and document encounter, findings & plan in the EHR  Current Plans  Pt Education - Pamphlet Given - Hernia Surgery: discussed with patient and provided information. I recommended obtaining preoperative cardiac clearance. I am concerned about the health of the patient and the ability to tolerate the operation. Therefore, we will request clearance by cardiology to better assess operative risk & see if a reevaluation, further workup, etc is needed. Also recommendations on how medications such as for anticoagulation and blood pressure should be managed/held/restarted after surgery.  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 But we did discuss that he would be at increased risk for hematoma formation  Leighton Ruff. Redmond Pulling, MD, FACS General, Bariatric, & Minimally Invasive Surgery [facility], [place]

## 2020-01-14 NOTE — Telephone Encounter (Signed)
   [facility] Pre-operative Risk Assessment    Request for surgical clearance:  1. What type of surgery is being performed? LEFT INGUINAL HERNIA REPAIR  2. When is this surgery scheduled?  TBD   3. What type of clearance is required (medical clearance vs. Pharmacy clearance to hold med vs. Both)? BOTH  4. Are there any medications that need to be held prior to surgery and how long? ELIQUIS  5. Practice name and name of physician performing surgery?  [facility]  DR ERIC WILSON  6. What is your office phone number [phone]    7.   What is your office fax number [phone]  8.   Anesthesia type (None, local, MAC, general) ?  GENERAL   Joe Durham 01/14/2020, 4:21 PM  _________________________________________________________________   (provider comments below)

## 2020-01-15 NOTE — Telephone Encounter (Addendum)
Patient with diagnosis of atrial fibrillation on Eliquis for anticoagulation.    Procedure: Left inguinal hernia repair Date of procedure: TBD  CHADS2-VASc score of  5 (HTN, AGE, stroke/tia x 2, CAD)  CrCl 57.3 ml/min Platelet count 216  Given patient's risk off anticoagulation due to history of stroke, will differ to provider.   Clearance reviewed by PharmD Olene Floss, Pharm.D, BCPS, CPP [facility]  [address] [place], [place] [address]  Phone: [phone]; Fax: [phone]

## 2020-01-17 NOTE — Telephone Encounter (Signed)
OK to hold Eliquis for 2 days prior to his inguinal hernia repair

## 2020-01-18 NOTE — Telephone Encounter (Signed)
Given past medical history and time since last visit, based on ACC/AHA guidelines, Joe Durham would be at acceptable risk for the planned procedure without further cardiovascular testing. He is able to achieve 4 mets of activity.   I will route this recommendation to the requesting party via Epic fax function and remove from pre-op pool.  Please call with questions.  [place], [place] 01/18/2020, 9:20 AM

## 2020-01-20 IMAGING — DX DG CHEST 1V PORT
1 series · 1 of 1 positions shown · non-contrast
Comparison: 09/06/2018

CLINICAL DATA: History of CABG.

EXAM:
PORTABLE CHEST 1 VIEW

[chest]
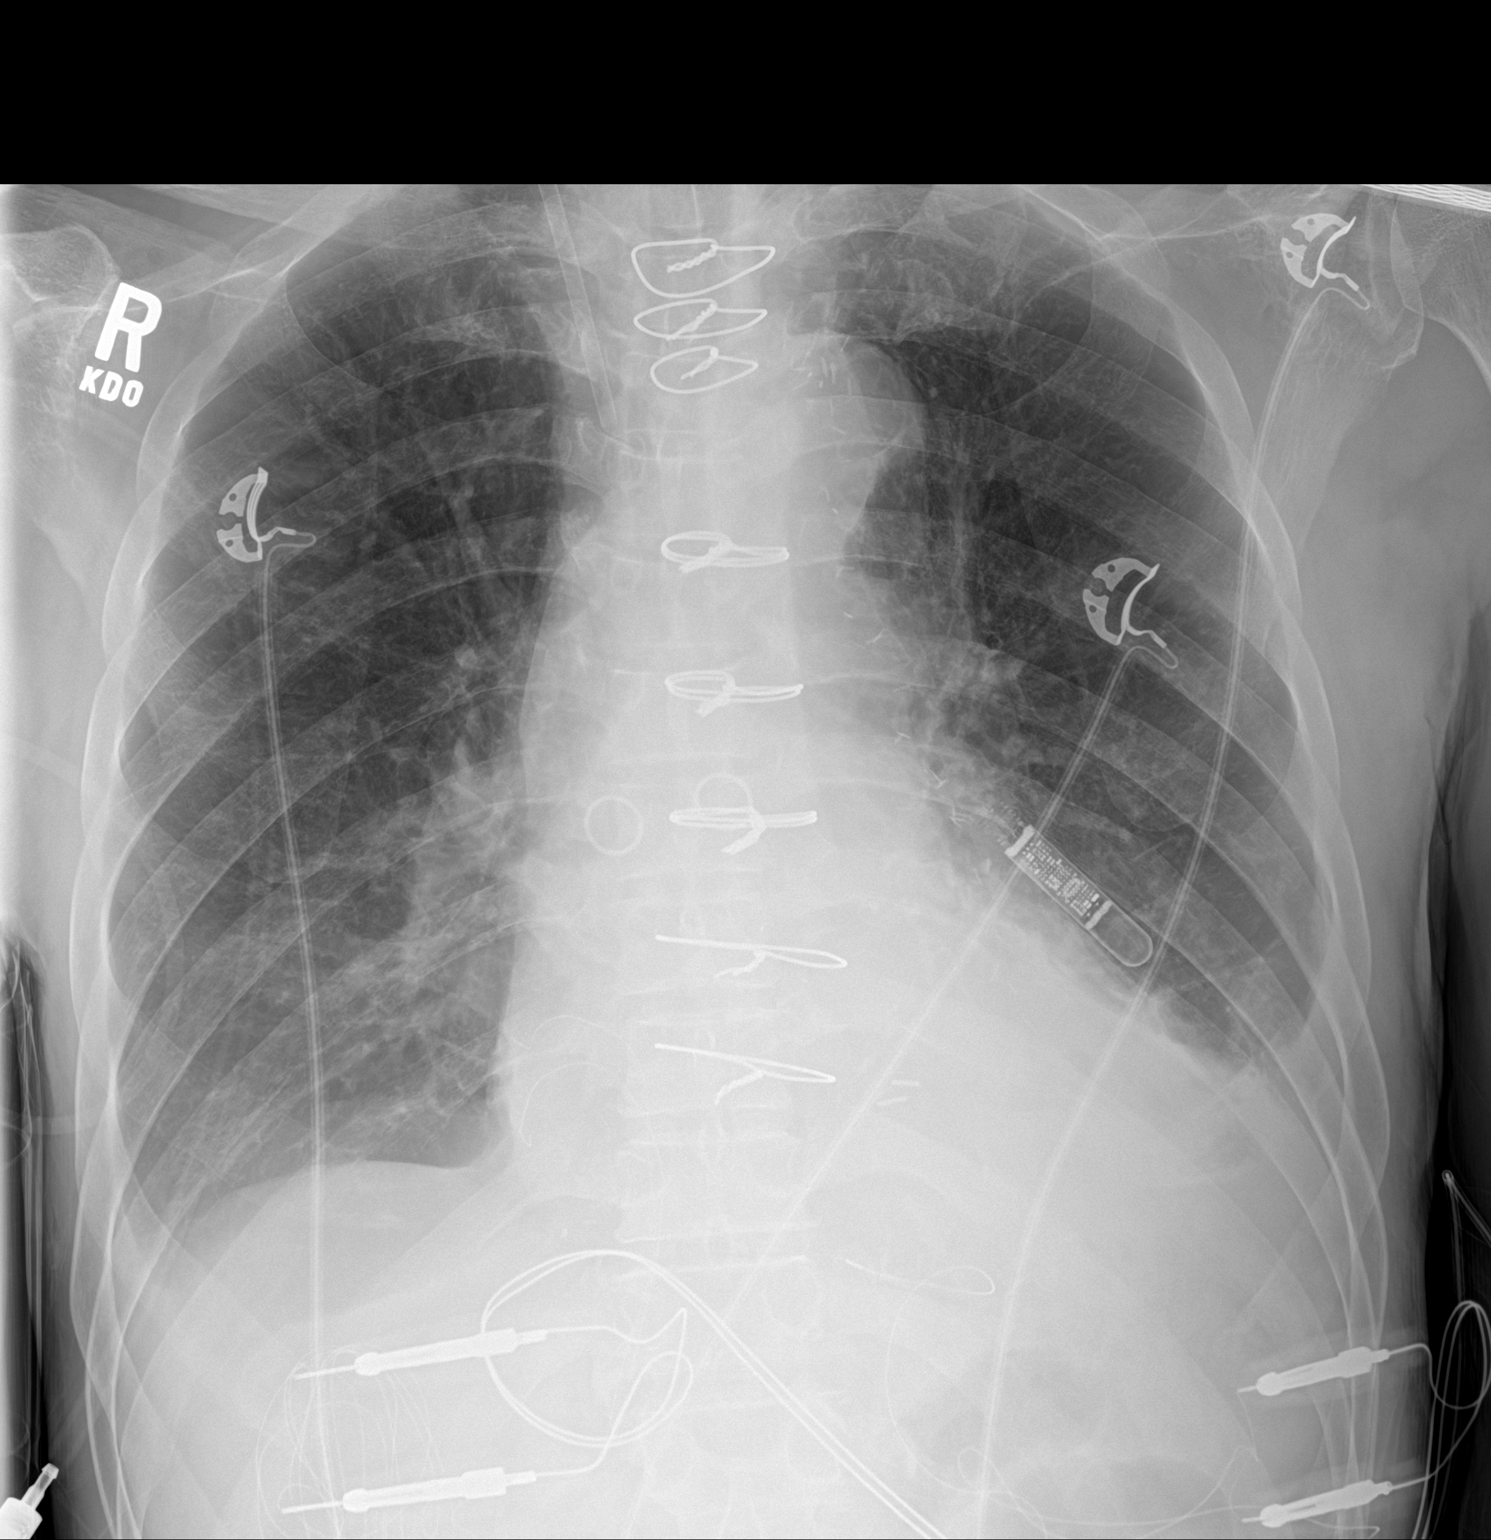

[1 of 1 positions shown; findings below may reference images not displayed]

FINDINGS: Swan-Ganz catheter, left chest tube and mediastinal drain have been
removed. Right internal jugular approach venous sheath remains. Loop
recorder noted. Stable postsurgical changes from CABG.

Mildly enlarged cardiac silhouette. Tortuosity and calcific
atherosclerotic disease of the aorta.

No evidence of pneumothorax. Small bilateral pleural effusions with
bilateral lower lobe atelectasis versus airspace consolidation.

Osseous structures are without acute abnormality. Soft tissues are
grossly normal.
IMPRESSION: Small bilateral pleural effusions with bilateral lower lobe
atelectasis versus airspace consolidation.

## 2020-01-23 IMAGING — CR DG CHEST 2V
2 series · 2 of 2 positions shown · non-contrast
Comparison: 09/09/2018

CLINICAL DATA: Pleural effusion; s/p CABG 09/05/18; hx of COPD and
HTN; smoker

EXAM:
CHEST - 2 VIEW

[chest lat]
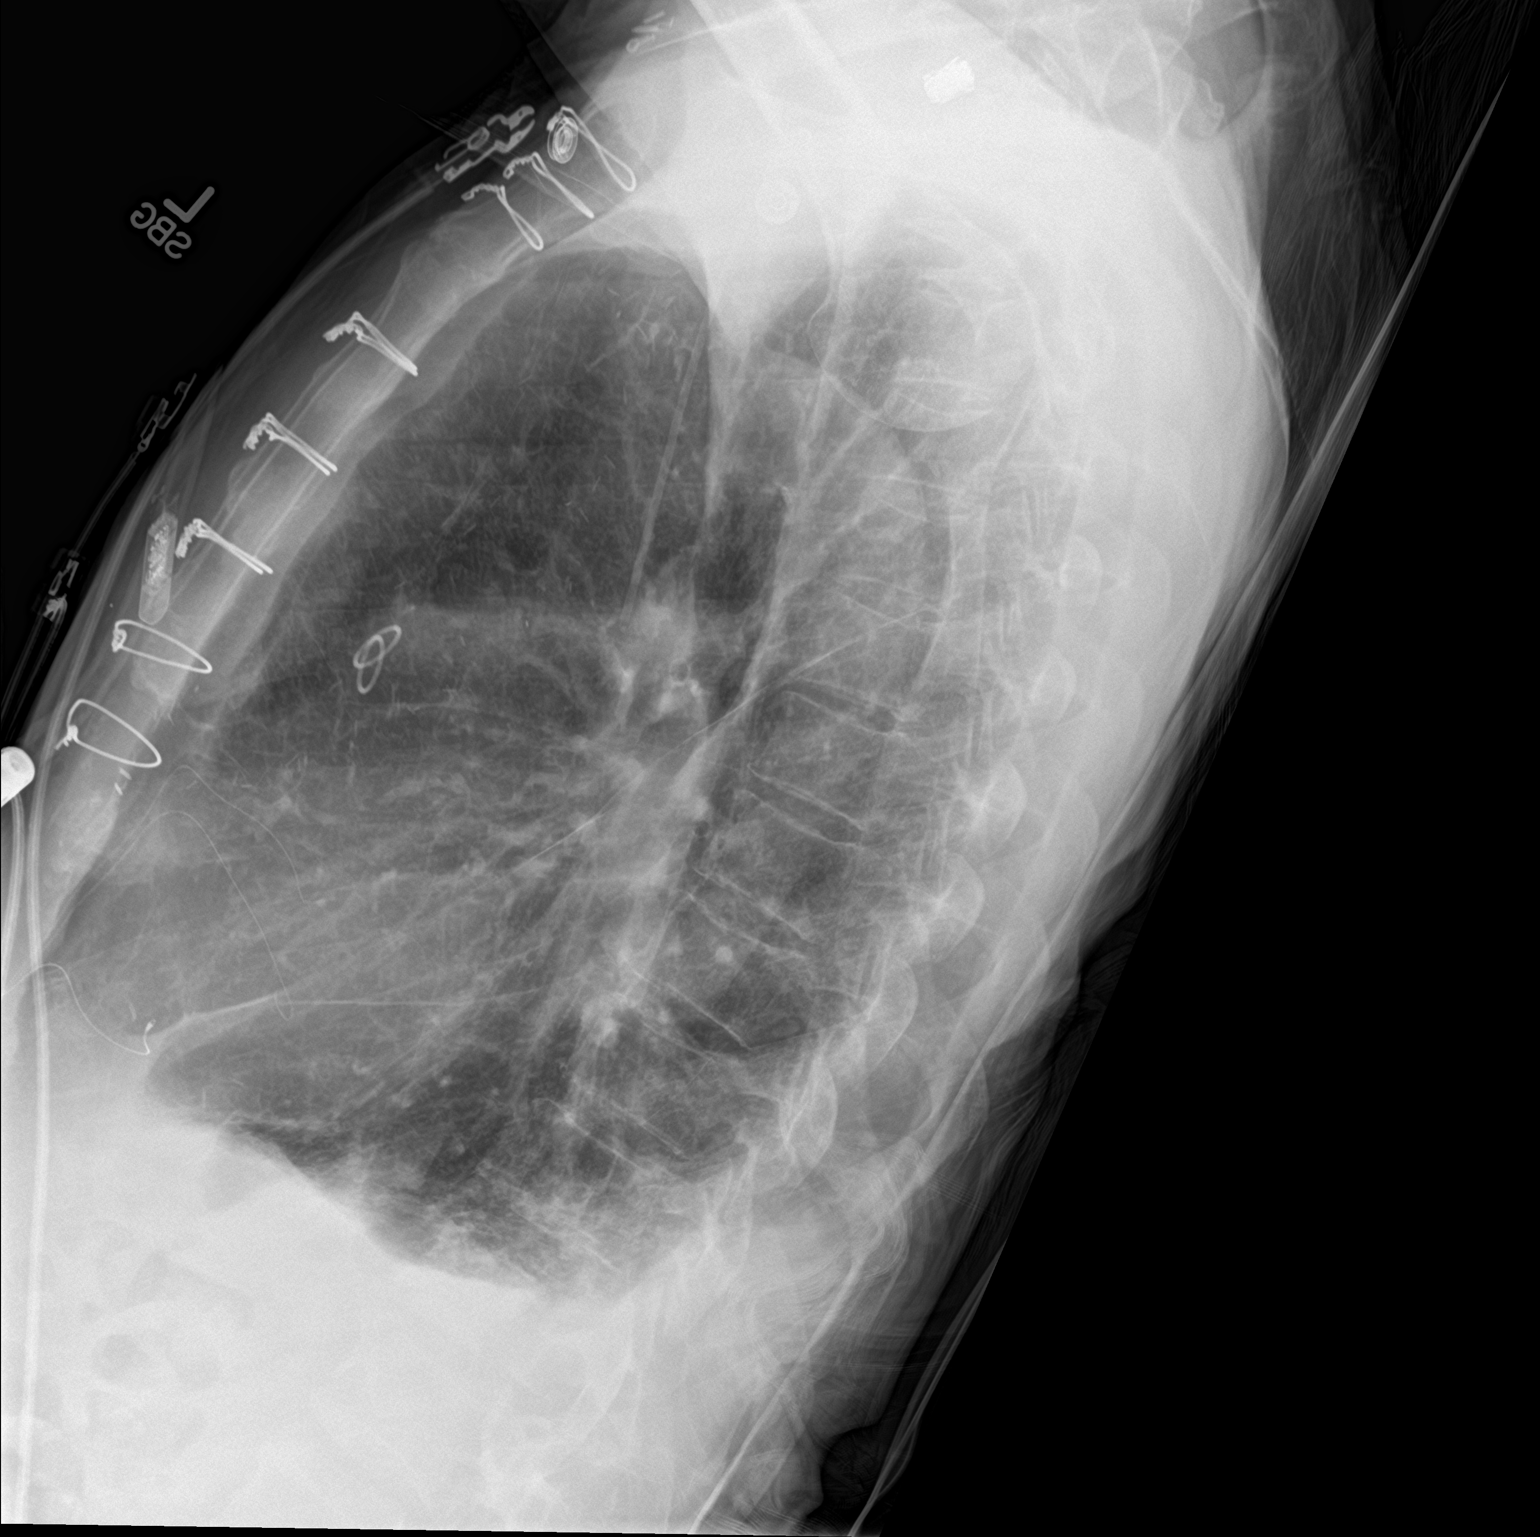

[chest ap]
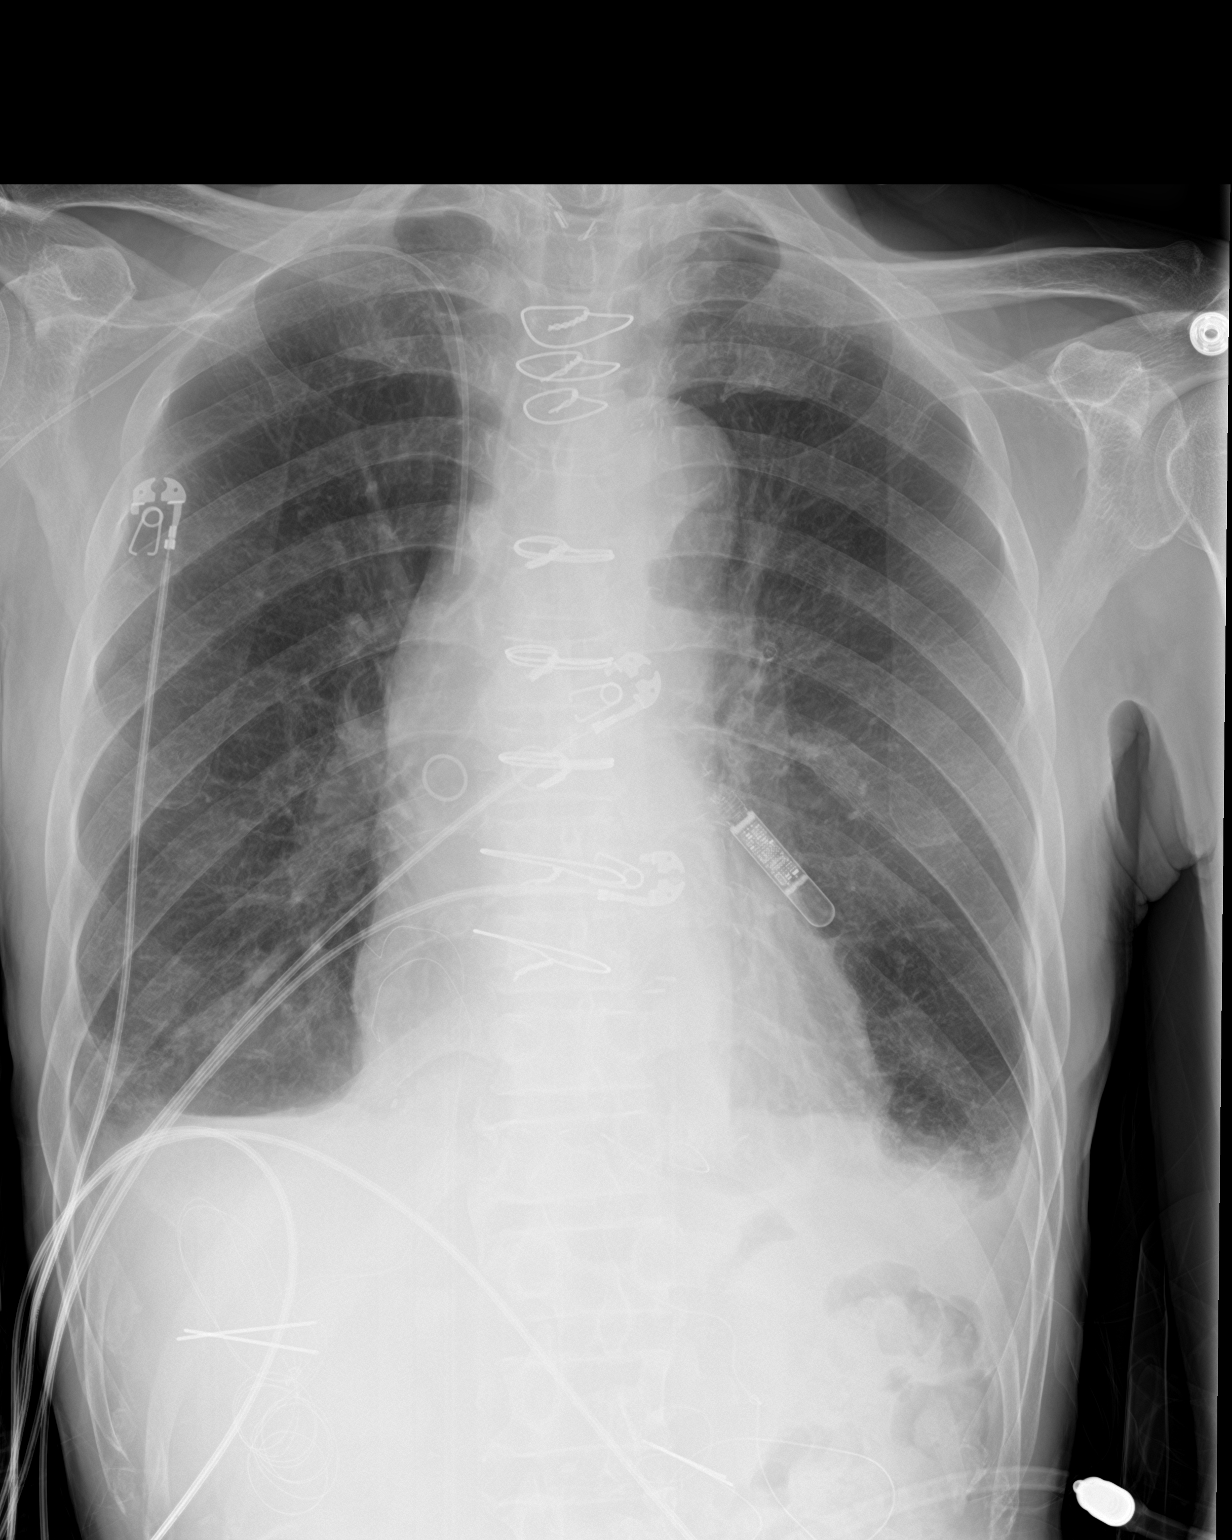

[2 of 2 positions shown; findings below may reference images not displayed]

FINDINGS: Right PICC line tip: SVC. Atherosclerotic calcification of the
aortic arch. Loop recorder noted. Prior CABG. Epicardial pacer leads
noted.

Small bilateral pleural effusions. Heart size within normal limits
for projection. No pneumothorax.
IMPRESSION: 1. Small bilateral pleural effusions.  No overt edema.
2. Prior CABG.
3. PICC line tip: SVC.
4.  Aortic Atherosclerosis ([id number]).

## 2020-01-27 ENCOUNTER — Encounter (HOSPITAL_COMMUNITY): Payer: Self-pay

## 2020-01-27 NOTE — Patient Instructions (Addendum)
DUE TO COVID-19 ONLY TWO VISITORS ARE ALLOWED TO COME WITH YOU AND STAY IN THE WAITING ROOM ONLY DURING PRE OP AND PROCEDURE. THE TWO VISITORS MAY VISIT WITH YOU IN YOUR PRIVATE ROOM DURING VISITING HOURS ONLY!!   COVID SWAB TESTING MUST BE COMPLETED ON:  Friday, January 29, 2020 at  2:30  [address], [place] [facility] enter pre surgical testing line (Must self quarantine after testing. Follow instructions on handout.)             Your procedure is scheduled on: Tuesday, February 02, 2020   Report to [facility] Main  Entrance    Report to admitting at 12:15 PM   Call this number if you have problems the morning of surgery [phone]   Do not eat food:After Midnight.   May have liquids until 11:15 AM day of surgery   CLEAR LIQUID DIET  Foods Allowed                                                                     Foods Excluded  Water, Black Coffee and tea, regular and decaf                             liquids that you cannot  Plain Jell-O in any flavor  (No red)                                           see through such as: Fruit ices (not with fruit pulp)                                     milk, soups, orange juice  Iced Popsicles (No red)                                    All solid food Carbonated beverages, regular and diet                                    Apple juices Sports drinks like Gatorade (No red) Lightly seasoned clear broth or consume(fat free) Sugar, honey syrup  Sample Menu  Breakfast                                Lunch                                      Cranberry juice                    Beef broth                            Jell-O  Grape juice                            Coffee or tea                        Jell-O                                                                                      Coffee or tea                           Complete one Ensure drink the morning of  surgery at 11:15 AM the day of surgery.   Oral Hygiene is also important to reduce your risk of infection.                                    Remember - BRUSH YOUR TEETH THE MORNING OF SURGERY WITH YOUR REGULAR TOOTHPASTE   Do NOT smoke after Midnight   Take these medicines the morning of surgery with A SIP OF WATER: Atorvastatin, Carvedilol, Cilostazol                               You may not have any metal on your body including jewelry, and body piercings             Do not wear lotions, powders, perfumes/cologne, or deodorant                          Men may shave face and neck.   Do not bring valuables to the hospital. [place].   Contacts, dentures or bridgework may not be worn into surgery.    Patients discharged the day of surgery will not be allowed to drive home.   Special Instructions: Bring a copy of your healthcare power of attorney and living will documents         the day of surgery if you haven't scanned them in before.              Please read over the following fact sheets you were given:  [facility] - Preparing for Surgery Before surgery, you can play an important role.  Because skin is not sterile, your skin needs to be as free of germs as possible.  You can reduce the number of germs on your skin by washing with CHG (chlorahexidine gluconate) soap before surgery.  CHG is an antiseptic cleaner which kills germs and bonds with the skin to continue killing germs even after washing. Please DO NOT use if you have an allergy to CHG or antibacterial soaps.  If your skin becomes reddened/irritated stop using the CHG and inform your nurse when you arrive at Short Stay. Do not shave (including legs and underarms) for at least  48 hours prior to the first CHG shower.  You may shave your face/neck.  Please follow these instructions carefully:  1.  Shower with CHG Soap the night before surgery and the  morning of surgery.  2.   If you choose to wash your hair, wash your hair first as usual with your normal  shampoo.  3.  After you shampoo, rinse your hair and body thoroughly to remove the shampoo.                             4.  Use CHG as you would any other liquid soap.  You can apply chg directly to the skin and wash.  Gently with a scrungie or clean washcloth.  5.  Apply the CHG Soap to your body ONLY FROM THE NECK DOWN.   Do not use on face/ open                           Wound or open sores. Avoid contact with eyes, ears mouth and genitals (private parts).                       Wash face,  Genitals (private parts) with your normal soap.             6.  Wash thoroughly, paying special attention to the area where your surgery  will be performed.  7.  Thoroughly rinse your body with warm water from the neck down.  8.  DO NOT shower/wash with your normal soap after using and rinsing off the CHG Soap.                9.  Pat yourself dry with a clean towel.            10.  Wear clean pajamas.            11.  Place clean sheets on your bed the night of your first shower and do not  sleep with pets. Day of Surgery : Do not apply any lotions/deodorants the morning of surgery.  Please wear clean clothes to the hospital/surgery center.  FAILURE TO FOLLOW THESE INSTRUCTIONS MAY RESULT IN THE CANCELLATION OF YOUR SURGERY  PATIENT SIGNATURE_________________________________  NURSE SIGNATURE__________________________________  ________________________________________________________________________

## 2020-01-28 ENCOUNTER — Encounter (HOSPITAL_COMMUNITY): Admission: RE | Admit: 2020-01-28 | Payer: Medicare Other | Source: Ambulatory Visit

## 2020-01-28 ENCOUNTER — Other Ambulatory Visit: Payer: Self-pay

## 2020-01-28 ENCOUNTER — Encounter (HOSPITAL_COMMUNITY): Payer: Self-pay

## 2020-01-28 ENCOUNTER — Encounter (HOSPITAL_COMMUNITY)
Admission: RE | Admit: 2020-01-28 | Discharge: 2020-01-28 | Disposition: A | Discharge status: Home / Self Care | Payer: Medicare Other | Source: Ambulatory Visit | Attending: General Surgery | Admitting: General Surgery

## 2020-01-28 DIAGNOSIS — I252 Old myocardial infarction: Secondary | ICD-10-CM | POA: Diagnosis not present

## 2020-01-28 DIAGNOSIS — I10 Essential (primary) hypertension: Secondary | ICD-10-CM | POA: Insufficient documentation

## 2020-01-28 DIAGNOSIS — I2510 Atherosclerotic heart disease of native coronary artery without angina pectoris: Secondary | ICD-10-CM | POA: Diagnosis not present

## 2020-01-28 DIAGNOSIS — Z01818 Encounter for other preprocedural examination: Secondary | ICD-10-CM | POA: Diagnosis not present

## 2020-01-28 DIAGNOSIS — Z8673 Personal history of transient ischemic attack (TIA), and cerebral infarction without residual deficits: Secondary | ICD-10-CM | POA: Diagnosis not present

## 2020-01-28 DIAGNOSIS — I480 Paroxysmal atrial fibrillation: Secondary | ICD-10-CM | POA: Insufficient documentation

## 2020-01-28 DIAGNOSIS — J449 Chronic obstructive pulmonary disease, unspecified: Secondary | ICD-10-CM | POA: Insufficient documentation

## 2020-01-28 DIAGNOSIS — Z7951 Long term (current) use of inhaled steroids: Secondary | ICD-10-CM | POA: Insufficient documentation

## 2020-01-28 DIAGNOSIS — F17210 Nicotine dependence, cigarettes, uncomplicated: Secondary | ICD-10-CM | POA: Diagnosis not present

## 2020-01-28 DIAGNOSIS — Z7901 Long term (current) use of anticoagulants: Secondary | ICD-10-CM | POA: Insufficient documentation

## 2020-01-28 DIAGNOSIS — Z87442 Personal history of urinary calculi: Secondary | ICD-10-CM | POA: Insufficient documentation

## 2020-01-28 DIAGNOSIS — Z951 Presence of aortocoronary bypass graft: Secondary | ICD-10-CM | POA: Insufficient documentation

## 2020-01-28 DIAGNOSIS — I739 Peripheral vascular disease, unspecified: Secondary | ICD-10-CM | POA: Diagnosis not present

## 2020-01-28 DIAGNOSIS — K4030 Unilateral inguinal hernia, with obstruction, without gangrene, not specified as recurrent: Secondary | ICD-10-CM | POA: Diagnosis not present

## 2020-01-28 DIAGNOSIS — Z79899 Other long term (current) drug therapy: Secondary | ICD-10-CM | POA: Insufficient documentation

## 2020-01-28 HISTORY — DX: Peripheral vascular disease, unspecified: I73.9

## 2020-01-28 NOTE — Progress Notes (Signed)
PCP - [facility] Cardiologist - Dr. Judie Petit. Croitoru  Chest x-ray - 05/03/19 EKG - 09/15/19 Stress Test - no ECHO - 05/02/19 Cardiac Cath - 09/03/2018  Sleep Study - no CPAP -   Fasting Blood Sugar - NA Checks Blood Sugar _____ times a day  Blood Thinner Instructions:Eliquis. Pt stopped taking it about one month ago. He never got refills. I told him to talk to his cardiologist. Aspirin Instructions: Last Dose:  Anesthesia review:   Patient denies shortness of breath, fever, cough and chest pain at PAT appointment yes  Patient verbalized understanding of instructions that were given to them at the PAT appointment. Patient was also instructed that they will need to review over the PAT instructions again at home before surgery. yes

## 2020-01-29 ENCOUNTER — Other Ambulatory Visit (HOSPITAL_COMMUNITY): Payer: Medicare Other

## 2020-01-29 ENCOUNTER — Other Ambulatory Visit (HOSPITAL_COMMUNITY)
Admission: RE | Admit: 2020-01-29 | Discharge: 2020-01-29 | Disposition: A | Discharge status: Home / Self Care | Payer: Medicare Other | Source: Ambulatory Visit | Attending: General Surgery | Admitting: General Surgery

## 2020-01-29 ENCOUNTER — Encounter (HOSPITAL_COMMUNITY)
Admission: RE | Admit: 2020-01-29 | Discharge: 2020-01-29 | Disposition: A | Discharge status: Home / Self Care | Payer: Medicare Other | Source: Ambulatory Visit | Attending: General Surgery | Admitting: General Surgery

## 2020-01-29 DIAGNOSIS — Z20822 Contact with and (suspected) exposure to covid-19: Secondary | ICD-10-CM | POA: Insufficient documentation

## 2020-01-29 DIAGNOSIS — Z01812 Encounter for preprocedural laboratory examination: Secondary | ICD-10-CM | POA: Diagnosis present

## 2020-01-29 DIAGNOSIS — Z01818 Encounter for other preprocedural examination: Secondary | ICD-10-CM | POA: Diagnosis not present

## 2020-01-29 LAB — COMPREHENSIVE METABOLIC PANEL
ALT: 11 U/L (ref 0–44)
AST: 22 U/L (ref 15–41)
Albumin: 3.9 g/dL (ref 3.5–5.0)
Alkaline Phosphatase: 72 U/L (ref 38–126)
Anion gap: 7 (ref 5–15)
BUN: 12 mg/dL (ref 8–23)
CO2: 27 mmol/L (ref 22–32)
Calcium: 9.4 mg/dL (ref 8.9–10.3)
Chloride: 107 mmol/L (ref 98–111)
Creatinine, Ser: 1.07 mg/dL (ref 0.61–1.24)
GFR calc Af Amer: >60 mL/min (ref >60)
GFR calc non Af Amer: >60 mL/min (ref >60)
Glucose, Bld: 82 mg/dL (ref 70–99)
Potassium: 4.4 mmol/L (ref 3.5–5.1)
Sodium: 141 mmol/L (ref 135–145)
Total Bilirubin: 0.9 mg/dL (ref 0.3–1.2)
Total Protein: 8.1 g/dL (ref 6.5–8.1)

## 2020-01-29 LAB — CBC WITH DIFFERENTIAL/PLATELET
Abs Immature Granulocytes: 0.02 10*3/uL (ref 0.00–0.07)
Basophils Absolute: 0.1 10*3/uL (ref 0.0–0.1)
Basophils Relative: 1 %
Eosinophils Absolute: 0.2 10*3/uL (ref 0.0–0.5)
Eosinophils Relative: 2 %
HCT: 38.2 % — ABNORMAL LOW (ref 39.0–52.0)
Hemoglobin: 12.4 g/dL — ABNORMAL LOW (ref 13.0–17.0)
Immature Granulocytes: 0 %
Lymphocytes Relative: 23 %
Lymphs Abs: 1.4 10*3/uL (ref 0.7–4.0)
MCH: 35.4 pg — ABNORMAL HIGH (ref 26.0–34.0)
MCHC: 32.5 g/dL (ref 30.0–36.0)
MCV: 109.1 fL — ABNORMAL HIGH (ref 80.0–100.0)
Monocytes Absolute: 0.8 10*3/uL (ref 0.1–1.0)
Monocytes Relative: 12 %
Neutro Abs: 3.9 10*3/uL (ref 1.7–7.7)
Neutrophils Relative %: 62 %
Platelets: 231 10*3/uL (ref 150–400)
RBC: 3.5 MIL/uL — ABNORMAL LOW (ref 4.22–5.81)
RDW: 14 % (ref 11.5–15.5)
WBC: 6.3 10*3/uL (ref 4.0–10.5)
nRBC: 0 % (ref 0.0–0.2)

## 2020-01-29 LAB — SARS CORONAVIRUS 2 (TAT 6-24 HRS): SARS Coronavirus 2: NEGATIVE

## 2020-02-01 ENCOUNTER — Telehealth: Payer: Self-pay

## 2020-02-01 MED ORDER — APIXABAN 5 MG PO TABS: 5.0000 mg | ORAL_TABLET | Freq: Two times a day (BID) | ORAL | 2 refills | Status: DC

## 2020-02-01 NOTE — Telephone Encounter (Signed)
-----   Message from [place], [place] sent at 02/01/2020 11:08 AM EDT ----- Regarding: FW: Anticoagulants/Pre-op Please forward this to Dr. Hazle Coca nurse.    ----- Message ----- From: Jodell Cipro, PA-C Sent: 02/01/2020  10:49 AM EDT To: Manson Passey, PA Subject: Anticoagulants/Pre-op                          Hi!  I am reviewing the chart for Joe Durham scheduled for incarcerated hernia repair tomorrow with Dr. Andrey Campanile.  He is currently on Eliquis, per Dr. Allyson Sabal ok to hold for two days prior to procedure.  Dr. Andrey Campanile to restart day after surgery.  During his pre-admission testing nurse appointment pt reported that he has not taken Eliquis in a month due to no refills/hasn't picked them up.  Pt was advised to contact your office, but I just wanted to inform you so the patient can be contacted to discuss restarting his blood thinner.   Thank you! Janey Genta [facility] Pre-Surgical Testing [phone] 02/01/20 10:52 AM

## 2020-02-01 NOTE — Telephone Encounter (Signed)
REFILL SENT. LM2CB-DISCUSS ELIQUIS AND JB F/U

## 2020-02-01 NOTE — Progress Notes (Signed)
Anesthesia Chart Review   Case: [id number] Date/Time: 02/02/20 1359   Procedure: OPEN REPAIR OF LEFT INCARCERATED INGUINAL HERNIA REPAIR WITH MESH (Left )   Anesthesia type: General   Pre-op diagnosis: LEFT INCARCERATED INGUINAL HERNIA   Location: [facility]OM 02 / [facility]   Surgeons: Gaynelle Adu, MD      DISCUSSION:74 y.o. current every day smoker (13 pack years) with h/o COPD, HTN, PAF (on Eliquis), CAD (CABG 2019), PVD, CVA 2016, chronic suprapubic catheter in place, left incarcerated inguinal hernia scheduled for above procedure 02/02/20 with Dr. Gaynelle Adu.   Pt cleared by cardiology.  Per Manson Passey, PA-C, "Given past medical history and time since last visit, based on ACC/AHA guidelines, Craige Valenza would be at acceptable risk for the planned procedure without further cardiovascular testing. He is able to achieve 4 mets of activity."  Per Dr. Allyson Sabal ok to hold Eliquis 2 days prior to procedure. Pt reported to PAT nurse that he discontinued Eliquis about 1 month ago, reports he did not get refills.  Pt advised to contact cardiology.  I have sent a staff message to cardio to make them aware.   Anticipate pt can proceed with planned procedure barring acute status change.   VS: BP (!) 162/78   Pulse (!) 58   Temp 36.7 C (Oral)   Resp 16   Ht 5\' 8"  (1.727 m)   Wt 52.2 kg   SpO2 100%   BMI 17.49 kg/m   PROVIDERS: , NP is PCP   Croitoru, Claiborne Rigg, MD is Cardiologist  LABS: Labs reviewed: Acceptable for surgery. (all labs ordered are listed, but only abnormal results are displayed)  Labs Reviewed  CBC WITH DIFFERENTIAL/PLATELET - Abnormal; Notable for the following components:      Result Value   RBC 3.50 (*)    Hemoglobin 12.4 (*)    HCT 38.2 (*)    MCV 109.1 (*)    MCH 35.4 (*)    All other components within normal limits  COMPREHENSIVE METABOLIC PANEL     IMAGES:   EKG: 06/01/2019 Rate 72 bpm Sinus rhythm with 2nd degree AV block (Mobitz  I) Anteroseptal infarct, age undetermined Prolonged QT  CV: Echo 05/02/2019 IMPRESSIONS  1. The left ventricle has normal systolic function, with an ejection fraction of 55-60%. The cavity size was normal. Left ventricular diastolic Doppler parameters are consistent with pseudonormalization. No evidence of left ventricular regional wall  motion abnormalities. 2. The right ventricle has normal systolic function. The cavity was normal. There is no increase in right ventricular wall thickness. 3. Left atrial size was mildly dilated. 4. Right atrial size was mildly dilated. 5. No evidence of mitral valve stenosis. Trivial mitral regurgitation. 6. The aortic valve is tricuspid. Mild calcification of the aortic valve. No stenosis of the aortic valve. 7. The aortic root is normal in size and structure. 8. Normal IVC size with PA systolic pressure 36 mmHg. Past Medical History:  Diagnosis Date  . Atrial fibrillation (HCC)   . Bladder calculi   . Claudication (HCC)   . COPD (chronic obstructive pulmonary disease) (HCC)   . DOE (dyspnea on exertion)   . Elevated blood pressure reading    12-22-2018 reprots he hasnt had his BP meds since this past thursday   . History of bladder stone   . 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 History  of urinary retention   . Hypertension   . Mobitz type 1 second degree atrioventricular block   . Myocardial infarction [facility]) 2019   non stemi  . Osteoarthritis   . PAD (peripheral artery disease) ([place])   . Paroxysmal atrial tachycardia ([place])   . Peripheral vascular disease ([place])    last duplex 09-25-2016 bilateral ABI 0.6 and occulsion of  right popiteal posterior tibials and left peroneal  . Presence of suprapubic catheter ([place])   . Stroke ([place])   . Wears glasses     Past Surgical History:  Procedure Laterality Date  . ABDOMINAL  AORTOGRAM W/LOWER EXTREMITY Bilateral 09/14/2019   Procedure: ABDOMINAL AORTOGRAM W/LOWER EXTREMITY;  Surgeon: Lorretta Harp, MD;  Location: [place] [facility];  Service: Cardiovascular;  Laterality: Bilateral;  . CORONARY ARTERY BYPASS GRAFT N/A 09/05/2018   Procedure: CORONARY ARTERY BYPASS GRAFTING (CABG) times 3 using left internal mammary artery and right greater saphenous vein harvested endoscopically.;  Surgeon: Ivin Poot, MD;  Location: [place];  Service: Open Heart Surgery;  Laterality: N/A;  . CYSTOSCOPY WITH LITHOLAPAXY N/A 07/15/2017   Procedure: OPEN CYSTOLITHOTOMY;  Surgeon: Lucas Mallow, MD;  [facility];  Service: Urology;  Laterality: N/A;  . INGUINAL HERNIA REPAIR Right 2011  . INSERTION OF SUPRAPUBIC CATHETER N/A 07/15/2017   Procedure: INSERTION OF SUPRAPUBIC CATHETER;  Surgeon: Lucas Mallow, MD;  [facility];  Service: Urology;  Laterality: N/A;  . LEFT HEART CATH AND CORONARY ANGIOGRAPHY N/A 09/03/2018   Procedure: LEFT HEART CATH AND CORONARY ANGIOGRAPHY;  Surgeon: Wellington Hampshire, MD;  Location: [place] [facility];  Service: Cardiovascular;  Laterality: N/A;  . LOOP RECORDER IMPLANT N/A 01/05/2015   Procedure: LOOP RECORDER IMPLANT;  Surgeon: Sanda Klein, MD;  Location: [place] C[facility];  Service: Cardiovascular;  Laterality: N/A;  . ORIF WRIST FRACTURE Left 03/20/2014   Procedure: OPEN REDUCTION INTERNAL FIXATION (ORIF) WRIST FRACTURE;  Surgeon: [name], MD;  Location: [place];  Service: Orthopedics;  Laterality: Left;  . TEE WITHOUT CARDIOVERSION N/A 01/05/2015   Procedure: TRANSESOPHAGEAL ECHOCARDIOGRAM (TEE)/LOOP;  Surgeon: Sanda Klein, MD;  Location: [place] [facility];  Service: Cardiovascular;  Laterality: N/A; ef 60-65%, no LA or RA thrombus, trivial PR, mild TR  . TEE WITHOUT CARDIOVERSION N/A 09/05/2018   Procedure: TRANSESOPHAGEAL ECHOCARDIOGRAM (TEE);  Surgeon: Prescott Gum, Collier Salina, MD;  Location: [place];  Service: Open Heart Surgery;   Laterality: N/A;  . TONSILLECTOMY  child  . TOTAL HIP ARTHROPLASTY Right 2000    MEDICATIONS: . acetaminophen (TYLENOL) 325 MG tablet  . apixaban (ELIQUIS) 5 MG TABS tablet  . atorvastatin (LIPITOR) 80 MG tablet  . carvedilol (COREG) 6.25 MG tablet  . cilostazol (PLETAL) 50 MG tablet  . lisinopril (ZESTRIL) 2.5 MG tablet  . mometasone-formoterol (DULERA) 100-5 MCG/ACT AERO  . pantoprazole (PROTONIX) 40 MG tablet  . umeclidinium bromide (INCRUSE ELLIPTA) 62.5 MCG/INH AEPB  . vitamin B-12 (CYANOCOBALAMIN) 100 MCG tablet   No current facility-administered medications for this encounter.    Maia Plan [facility] Pre[facility]Surgical Testing [phone] 02/01/20  11:08 AM

## 2020-02-02 ENCOUNTER — Ambulatory Visit (HOSPITAL_COMMUNITY): Payer: Medicare Other | Admitting: Physician Assistant

## 2020-02-02 ENCOUNTER — Ambulatory Visit (HOSPITAL_COMMUNITY): Payer: Medicare Other | Admitting: Anesthesiology

## 2020-02-02 ENCOUNTER — Encounter (HOSPITAL_COMMUNITY): Payer: Self-pay | Admitting: General Surgery

## 2020-02-02 ENCOUNTER — Ambulatory Visit (HOSPITAL_COMMUNITY)
Admission: RE | Admit: 2020-02-02 | Discharge: 2020-02-02 | Disposition: A | Discharge status: Home / Self Care | Payer: Medicare Other | Attending: General Surgery | Admitting: General Surgery

## 2020-02-02 ENCOUNTER — Encounter (HOSPITAL_COMMUNITY)
Admission: RE | Disposition: A | Discharge status: Home / Self Care | Payer: Self-pay | Source: Home / Self Care | Attending: General Surgery

## 2020-02-02 DIAGNOSIS — J449 Chronic obstructive pulmonary disease, unspecified: Secondary | ICD-10-CM | POA: Insufficient documentation

## 2020-02-02 DIAGNOSIS — Z8711 Personal history of peptic ulcer disease: Secondary | ICD-10-CM | POA: Diagnosis not present

## 2020-02-02 DIAGNOSIS — I441 Atrioventricular block, second degree: Secondary | ICD-10-CM | POA: Insufficient documentation

## 2020-02-02 DIAGNOSIS — K4030 Unilateral inguinal hernia, with obstruction, without gangrene, not specified as recurrent: Secondary | ICD-10-CM | POA: Diagnosis present

## 2020-02-02 DIAGNOSIS — I739 Peripheral vascular disease, unspecified: Secondary | ICD-10-CM | POA: Diagnosis not present

## 2020-02-02 DIAGNOSIS — Z8673 Personal history of transient ischemic attack (TIA), and cerebral infarction without residual deficits: Secondary | ICD-10-CM | POA: Insufficient documentation

## 2020-02-02 DIAGNOSIS — Z79899 Other long term (current) drug therapy: Secondary | ICD-10-CM | POA: Diagnosis not present

## 2020-02-02 DIAGNOSIS — I4891 Unspecified atrial fibrillation: Secondary | ICD-10-CM | POA: Insufficient documentation

## 2020-02-02 DIAGNOSIS — Z7902 Long term (current) use of antithrombotics/antiplatelets: Secondary | ICD-10-CM | POA: Diagnosis not present

## 2020-02-02 DIAGNOSIS — F17200 Nicotine dependence, unspecified, uncomplicated: Secondary | ICD-10-CM | POA: Diagnosis not present

## 2020-02-02 DIAGNOSIS — I10 Essential (primary) hypertension: Secondary | ICD-10-CM | POA: Diagnosis not present

## 2020-02-02 HISTORY — PX: INGUINAL HERNIA REPAIR: SHX194

## 2020-02-02 SURGERY — REPAIR, HERNIA, INGUINAL, ADULT
Anesthesia: General | Site: Groin | Laterality: Left

## 2020-02-02 MED ORDER — LIDOCAINE 2% (20 MG/ML) 5 ML SYRINGE
INTRAMUSCULAR | Status: AC
  Filled 2020-02-02: qty 5

## 2020-02-02 MED ORDER — SUGAMMADEX SODIUM 200 MG/2ML IV SOLN
INTRAVENOUS | Status: DC | PRN
  Administered 2020-02-02: 100 mg via INTRAVENOUS

## 2020-02-02 MED ORDER — PROPOFOL 10 MG/ML IV BOLUS
INTRAVENOUS | Status: AC
  Filled 2020-02-02: qty 20

## 2020-02-02 MED ORDER — HYDROCODONE-ACETAMINOPHEN 7.5-325 MG PO TABS: 1.0000 | ORAL_TABLET | Freq: Once | ORAL | Status: DC | PRN

## 2020-02-02 MED ORDER — FENTANYL CITRATE (PF) 100 MCG/2ML IJ SOLN
INTRAMUSCULAR | Status: AC
  Filled 2020-02-02: qty 2

## 2020-02-02 MED ORDER — ONDANSETRON HCL 4 MG/2ML IJ SOLN
INTRAMUSCULAR | Status: DC | PRN
  Administered 2020-02-02: 4 mg via INTRAVENOUS

## 2020-02-02 MED ORDER — MEPERIDINE HCL 50 MG/ML IJ SOLN: 6.2500 mg | INTRAMUSCULAR | Status: DC | PRN

## 2020-02-02 MED ORDER — HYDROCODONE-ACETAMINOPHEN 7.5-325 MG PO TABS
ORAL_TABLET | ORAL | Status: AC
  Filled 2020-02-02: qty 1

## 2020-02-02 MED ORDER — PROPOFOL 10 MG/ML IV BOLUS
INTRAVENOUS | Status: DC | PRN
  Administered 2020-02-02: 140 mg via INTRAVENOUS

## 2020-02-02 MED ORDER — OXYCODONE HCL 5 MG PO TABS: 5.0000 mg | ORAL_TABLET | Freq: Four times a day (QID) | ORAL | 0 refills | Status: AC | PRN

## 2020-02-02 MED ORDER — ENSURE PRE-SURGERY PO LIQD
296.0000 mL | Freq: Once | ORAL | Status: DC
  Filled 2020-02-02: qty 296

## 2020-02-02 MED ORDER — ONDANSETRON HCL 4 MG/2ML IJ SOLN: 4.0000 mg | Freq: Once | INTRAMUSCULAR | Status: DC | PRN

## 2020-02-02 MED ORDER — SODIUM CHLORIDE 0.9 % IV SOLN
INTRAVENOUS | Status: AC
  Filled 2020-02-02: qty 500000

## 2020-02-02 MED ORDER — ONDANSETRON HCL 4 MG/2ML IJ SOLN
INTRAMUSCULAR | Status: AC
  Filled 2020-02-02: qty 2

## 2020-02-02 MED ORDER — ACETAMINOPHEN 500 MG PO TABS
1000.0000 mg | ORAL_TABLET | ORAL | Status: AC
  Administered 2020-02-02: 1000 mg via ORAL
  Filled 2020-02-02: qty 2

## 2020-02-02 MED ORDER — BUPIVACAINE-EPINEPHRINE (PF) 0.5% -1:200000 IJ SOLN
INTRAMUSCULAR | Status: AC
  Filled 2020-02-02: qty 30

## 2020-02-02 MED ORDER — FENTANYL CITRATE (PF) 100 MCG/2ML IJ SOLN
25.0000 ug | INTRAMUSCULAR | Status: DC | PRN
  Administered 2020-02-02 (×3): 50 ug via INTRAVENOUS

## 2020-02-02 MED ORDER — KETOROLAC TROMETHAMINE 30 MG/ML IJ SOLN
INTRAMUSCULAR | Status: AC
  Filled 2020-02-02: qty 1

## 2020-02-02 MED ORDER — MIDAZOLAM HCL 2 MG/2ML IJ SOLN
INTRAMUSCULAR | Status: AC
  Filled 2020-02-02: qty 2

## 2020-02-02 MED ORDER — CHLORHEXIDINE GLUCONATE CLOTH 2 % EX PADS: 6.0000 | MEDICATED_PAD | Freq: Once | CUTANEOUS | Status: DC

## 2020-02-02 MED ORDER — DEXAMETHASONE SODIUM PHOSPHATE 10 MG/ML IJ SOLN
INTRAMUSCULAR | Status: DC | PRN
  Administered 2020-02-02: 10 mg via INTRAVENOUS

## 2020-02-02 MED ORDER — CEFAZOLIN SODIUM-DEXTROSE 2-4 GM/100ML-% IV SOLN
2.0000 g | INTRAVENOUS | Status: AC
  Administered 2020-02-02: 2 g via INTRAVENOUS
  Filled 2020-02-02: qty 100

## 2020-02-02 MED ORDER — ROCURONIUM BROMIDE 10 MG/ML (PF) SYRINGE
PREFILLED_SYRINGE | INTRAVENOUS | Status: DC | PRN
  Administered 2020-02-02: 50 mg via INTRAVENOUS

## 2020-02-02 MED ORDER — LACTATED RINGERS IV SOLN: INTRAVENOUS | Status: DC

## 2020-02-02 MED ORDER — LIDOCAINE 2% (20 MG/ML) 5 ML SYRINGE
INTRAMUSCULAR | Status: DC | PRN
  Administered 2020-02-02: 80 mg via INTRAVENOUS

## 2020-02-02 MED ORDER — FENTANYL CITRATE (PF) 100 MCG/2ML IJ SOLN
50.0000 ug | INTRAMUSCULAR | Status: AC
  Administered 2020-02-02: 50 ug via INTRAVENOUS
  Filled 2020-02-02: qty 2

## 2020-02-02 MED ORDER — BUPIVACAINE HCL 0.5 % IJ SOLN
INTRAMUSCULAR | Status: DC | PRN
  Administered 2020-02-02: 15 mL

## 2020-02-02 MED ORDER — MIDAZOLAM HCL 2 MG/2ML IJ SOLN
1.0000 mg | INTRAMUSCULAR | Status: AC
  Administered 2020-02-02: 1 mg via INTRAVENOUS
  Filled 2020-02-02: qty 2

## 2020-02-02 MED ORDER — 0.9 % SODIUM CHLORIDE (POUR BTL) OPTIME
TOPICAL | Status: DC | PRN
  Administered 2020-02-02: 14:00:00 1000 mL

## 2020-02-02 MED ORDER — ACETAMINOPHEN 500 MG PO TABS: 1000.0000 mg | ORAL_TABLET | Freq: Three times a day (TID) | ORAL | 0 refills | Status: AC

## 2020-02-02 MED ORDER — BUPIVACAINE-EPINEPHRINE (PF) 0.5% -1:200000 IJ SOLN
INTRAMUSCULAR | Status: DC | PRN
  Administered 2020-02-02: 5 mL

## 2020-02-02 MED ORDER — SODIUM CHLORIDE 0.9 % IV SOLN
INTRAVENOUS | Status: DC | PRN
  Administered 2020-02-02: 100 mL

## 2020-02-02 MED ORDER — BUPIVACAINE LIPOSOME 1.3 % IJ SUSP
INTRAMUSCULAR | Status: DC | PRN
  Administered 2020-02-02: 10 mL via PERINEURAL

## 2020-02-02 MED ORDER — ROCURONIUM BROMIDE 10 MG/ML (PF) SYRINGE
PREFILLED_SYRINGE | INTRAVENOUS | Status: AC
  Filled 2020-02-02: qty 10

## 2020-02-02 MED ORDER — FENTANYL CITRATE (PF) 100 MCG/2ML IJ SOLN
INTRAMUSCULAR | Status: DC | PRN
  Administered 2020-02-02 (×2): 50 ug via INTRAVENOUS

## 2020-02-02 MED ORDER — KETOROLAC TROMETHAMINE 30 MG/ML IJ SOLN
INTRAMUSCULAR | Status: DC | PRN
  Administered 2020-02-02: 15 mg via INTRAVENOUS

## 2020-02-02 MED ORDER — DEXAMETHASONE SODIUM PHOSPHATE 10 MG/ML IJ SOLN
INTRAMUSCULAR | Status: AC
  Filled 2020-02-02: qty 1

## 2020-02-02 SURGICAL SUPPLY — 35 items
ADH SKN CLS APL DERMABOND .7 (GAUZE/BANDAGES/DRESSINGS) ×1
APL PRP STRL LF DISP 70% ISPRP (MISCELLANEOUS) ×1
BAG DECANTER FOR FLEXI CONT (MISCELLANEOUS) ×3 IMPLANT
CHLORAPREP W/TINT 26 (MISCELLANEOUS) ×2 IMPLANT
COVER SURGICAL LIGHT HANDLE (MISCELLANEOUS) ×3 IMPLANT
COVER WAND RF STERILE (DRAPES) IMPLANT
DECANTER SPIKE VIAL GLASS SM (MISCELLANEOUS) ×2 IMPLANT
DERMABOND ADVANCED (GAUZE/BANDAGES/DRESSINGS) ×2
DERMABOND ADVANCED .7 DNX12 (GAUZE/BANDAGES/DRESSINGS) ×1 IMPLANT
DRAIN PENROSE 0.5X18 (DRAIN) IMPLANT
DRAPE LAPAROTOMY T 98X78 PEDS (DRAPES) ×3 IMPLANT
ELECT REM PT RETURN 15FT ADLT (MISCELLANEOUS) ×3 IMPLANT
GLOVE BIO SURGEON STRL SZ7.5 (GLOVE) ×5 IMPLANT
GLOVE BIOGEL PI IND STRL 7.0 (GLOVE) IMPLANT
GLOVE BIOGEL PI INDICATOR 7.0 (GLOVE) ×2
GLOVE ECLIPSE 6.5 STRL STRAW (GLOVE) ×2 IMPLANT
GLOVE INDICATOR 8.0 STRL GRN (GLOVE) ×3 IMPLANT
GLOVE SURG SS PI 7.0 STRL IVOR (GLOVE) ×2 IMPLANT
GOWN STRL REUS W/TWL XL LVL3 (GOWN DISPOSABLE) ×6 IMPLANT
KIT BASIN OR (CUSTOM PROCEDURE TRAY) ×3 IMPLANT
KIT TURNOVER KIT A (KITS) IMPLANT
MESH ULTRAPRO 3X6 7.6X15CM (Mesh General) ×2 IMPLANT
NEEDLE HYPO 22GX1.5 SAFETY (NEEDLE) ×3 IMPLANT
PACK GENERAL/GYN (CUSTOM PROCEDURE TRAY) ×3 IMPLANT
PENCIL SMOKE EVACUATOR (MISCELLANEOUS) ×2 IMPLANT
STAPLER VISISTAT 35W (STAPLE) ×2 IMPLANT
SUT MNCRL AB 4-0 PS2 18 (SUTURE) ×3 IMPLANT
SUT PROLENE 2 0 CT2 30 (SUTURE) ×8 IMPLANT
SUT VIC AB 2-0 SH 27 (SUTURE) ×3
SUT VIC AB 2-0 SH 27X BRD (SUTURE) ×1 IMPLANT
SUT VIC AB 3-0 SH 18 (SUTURE) ×3 IMPLANT
SYR 20ML LL LF (SYRINGE) ×3 IMPLANT
SYR BULB IRRIGATION 50ML (SYRINGE) ×2 IMPLANT
TOWEL OR 17X26 10 PK STRL BLUE (TOWEL DISPOSABLE) ×3 IMPLANT
TOWEL OR NON WOVEN STRL DISP B (DISPOSABLE) ×3 IMPLANT

## 2020-02-02 NOTE — Op Note (Signed)
Joe Durham [id number] Oct 04, 1946 02/02/2020   Open Repair of chronically incarcerated Left indirect Inguinal Hernia with Mesh Procedure Note  Indications: The patient presented with a history of a left, not reducible hernia inguinal hernia. He had a chronically incarcerated left inguinal hernia. He also had an indwelling suprapubic catheter. He was symptomatic from his inguinal hernia and desires repair. We received permission to hold his antiplatelet agent perioperatively.  Pre-operative Diagnosis: left incarcerated (not reducible) inguinal hernia  Post-operative Diagnosis: left incarcerated indirect inguinal hernia  Assistants: none  Anesthesia: General endotracheal anesthesia  Surgeon: Leighton Ruff. Redmond Pulling, MD, FACS  EBL: minimal  Procedure Details  The patient was seen again in the Holding Room. The risks, benefits, complications, treatment options, and expected outcomes were discussed with the patient. The possibilities of reaction to medication, pulmonary aspiration, perforation of viscus, bleeding, recurrent infection, the need for additional procedures, and development of a complication requiring transfusion or further operation were discussed with the patient and/or family. The likelihood of success in repairing the hernia and returning the patient to their previous functional status is good.  There was concurrence with the proposed plan, and informed consent was obtained. The site of surgery was properly noted/marked. Received oral Tylenol and gabapentin preoperatively. He also received a tap block by anesthesia.   The patient was taken to the Operating Room, identified as Joe Durham, and the procedure verified as left inguinal hernia repair. A Time Out was held and the above information confirmed.  The patient was placed in the supine position and underwent induction of anesthesia. The lower abdomen and groin was prepped with Chloraprep and draped in the standard fashion. The  suprapubic catheter was draped outside of the field. 0.25% Marcaine with epinephrine was used to anesthetize the skin over the mid-portion of the inguinal canal. An oblique incision was made. Dissection was carried down through the subcutaneous tissue with cautery to the external oblique fascia.  We opened the external oblique fascia along the direction of its fibers to the external ring.  The spermatic cord was circumferentially dissected bluntly and retracted with a Penrose drain.  The floor of the inguinal canal was inspected & there was no direct defect. He had an incarcerated indirect inguinal hernia containing small bowel contents. We skeletonized the spermatic cord from the hernia sac. I was ultimately able to reduce the herniated small bowel back into the abdominal cavity. The cord structures were densely adherent to the hernia sac. I did get into the hernia sac without injuring the small bowel. I was ultimately able to separate the cord structures from the hernia sac. The iliohypogastric nerve was divided because it was densely adherent to the sac and I could not separated. I did close the defect in the hernia sac with a figure-of-eight 3-0 Vicryl suture. The hernia sac was reduced back into the abdominal cavity.  We used a 3 x 6 inch piece of Ultrapro mesh, which was cut into a keyhole shape.  This was secured with 2-0 Prolene, beginning at the pubic tubercle, running this along the shelving edge inferiorly. Superiorly, the mesh was secured to the internal oblique fascia with interrupted 2-0 Prolene sutures.  The tails of the mesh were sutured together behind the spermatic cord.  The mesh was tucked underneath the external oblique fascia laterally. The wound was irrigated with antibiotic irrigation. The external oblique fascia was reapproximated with 2-0 Vicryl.  3-0 Vicryl was used to close the subcutaneous tissues and 4-0 Monocryl was used to close the  skin in subcuticular fashion. Dermabond was applied.   The patient was then extubated and brought to the recovery room in stable condition.  All sponge, instrument, and needle counts were correct prior to closure and at the conclusion of the case.   Estimated Blood Loss: Minimal                 Complications: None; patient tolerated the procedure well.         Disposition: PACU - hemodynamically stable.         Condition: stable  Mary Sella. Andrey Campanile, MD, FACS General, Bariatric, & Minimally Invasive Surgery [facility], [place]

## 2020-02-02 NOTE — Interval H&P Note (Signed)
History and Physical Interval Note:  02/02/2020 1:17 PM  Joe Durham  has presented today for surgery, with the diagnosis of LEFT INCARCERATED INGUINAL HERNIA.  The various methods of treatment have been discussed with the patient and family. After consideration of risks, benefits and other options for treatment, the patient has consented to  Procedure(s): OPEN REPAIR OF LEFT INCARCERATED INGUINAL HERNIA REPAIR WITH MESH (Left) as a surgical intervention.  The patient's history has been reviewed, patient examined, no change in status, stable for surgery.  I have reviewed the patient's chart and labs.  Questions were answered to the patient's satisfaction.     Gaynelle Adu

## 2020-02-02 NOTE — Anesthesia Procedure Notes (Signed)
Procedure Name: Intubation Date/Time: 02/02/2020 1:35 PM Performed by: Ronit Cranfield D, CRNA Pre-anesthesia Checklist: Patient identified, Emergency Drugs available, Suction available and Patient being monitored Patient Re-evaluated:Patient Re-evaluated prior to induction Oxygen Delivery Method: Circle system utilized Preoxygenation: Pre-oxygenation with 100% oxygen Induction Type: IV induction Ventilation: Mask ventilation without difficulty Laryngoscope Size: Mac and 4 Grade View: Grade I Tube type: Oral Tube size: 7.5 mm Number of attempts: 1 Airway Equipment and Method: Stylet and Bite block Placement Confirmation: ETT inserted through vocal cords under direct vision,  positive ETCO2 and breath sounds checked- equal and bilateral Secured at: 22 cm Tube secured with: Tape Dental Injury: Teeth and Oropharynx as per pre-operative assessment

## 2020-02-02 NOTE — Transfer of Care (Signed)
Immediate Anesthesia Transfer of Care Note  Patient: Joe Durham  Procedure(s) Performed: OPEN REPAIR OF LEFT INCARCERATED INGUINAL HERNIA REPAIR WITH MESH (Left Groin)  Patient Location: PACU  Anesthesia Type:General  Level of Consciousness: awake, alert  and oriented  Airway & Oxygen Therapy: Patient Spontanous Breathing and Patient connected to face mask oxygen  Post-op Assessment: Report given to RN and Post -op Vital signs reviewed and stable  Post vital signs: Reviewed and stable  Last Vitals:  Vitals Value Taken Time  BP 175/100 02/02/20 1503  Temp    Pulse 47 02/02/20 1505  Resp 15 02/02/20 1505  SpO2 99 % 02/02/20 1505  Vitals shown include unvalidated device data.  Last Pain:  Vitals:   02/02/20 1255  TempSrc:   PainSc: 0-No pain         Complications: No apparent anesthesia complications

## 2020-02-02 NOTE — Anesthesia Postprocedure Evaluation (Signed)
Anesthesia Post Note  Patient: Joe Durham  Procedure(s) Performed: OPEN REPAIR OF LEFT INCARCERATED INGUINAL HERNIA REPAIR WITH MESH (Left Groin)     Patient location during evaluation: PACU Anesthesia Type: General Level of consciousness: awake and alert Pain management: pain level controlled Vital Signs Assessment: post-procedure vital signs reviewed and stable Respiratory status: spontaneous breathing, nonlabored ventilation and respiratory function stable Cardiovascular status: blood pressure returned to baseline and stable Postop Assessment: no apparent nausea or vomiting Anesthetic complications: no    Last Vitals:  Vitals:   02/02/20 1545 02/02/20 1600  BP: (!) 218/81 (!) 212/80  Pulse: 72 (!) 41  Resp: 14 20  Temp:    SpO2: 96% 100%    Last Pain:  Vitals:   02/02/20 1255  TempSrc:   PainSc: 0-No pain                 Arnol Mcgibbon A.

## 2020-02-02 NOTE — Progress Notes (Signed)
Assisted Dr. Foster with left, ultrasound guided, transabdominal plane block. Side rails up, monitors on throughout procedure. See vital signs in flow sheet. Tolerated Procedure well. 

## 2020-02-02 NOTE — Anesthesia Procedure Notes (Signed)
Anesthesia Regional Block: TAP block   Pre-Anesthetic Checklist: ,, timeout performed, Correct Patient, Correct Site, Correct Laterality, Correct Procedure, Correct Position, site marked, Risks and benefits discussed,  Surgical consent,  Pre-op evaluation,  At surgeon's request and post-op pain management  Laterality: Left  Prep: chloraprep       Needles:  Injection technique: Single-shot  Needle Type: Echogenic Stimulator Needle     Needle Length: 9cm  Needle Gauge: 21   Needle insertion depth: 6 cm   Additional Needles:   Procedures:,,,, ultrasound used (permanent image in chart),,,,  Narrative:  Start time: 02/02/2020 12:50 PM End time: 02/02/2020 12:55 PM Injection made incrementally with aspirations every 5 mL.  Performed by: Personally  Anesthesiologist: Mal Amabile, MD  Additional Notes: Timeout performed. Patient sedated. Relevant anatomy ID'd using [place]. Incremental 2-16ml injection of LA with frequent aspiration. Patient tolerated procedure well. 4       Left TAP Block

## 2020-02-02 NOTE — Anesthesia Preprocedure Evaluation (Signed)
Anesthesia Evaluation  Patient identified by MRN, date of birth, ID band Patient awake    Reviewed: Allergy & Precautions, NPO status , Patient's Chart, lab work & pertinent test results, reviewed documented beta blocker date and time   Airway Mallampati: II  TM Distance: >3 FB Neck ROM: Full    Dental  (+) Edentulous Upper, Poor Dentition   Pulmonary COPD,  COPD inhaler, Current Smoker,    breath sounds clear to auscultation + decreased breath sounds      Cardiovascular hypertension, Pt. on medications and Pt. on home beta blockers + Past MI, + Peripheral Vascular Disease and + DOE  Normal cardiovascular exam+ dysrhythmias Atrial Fibrillation and Supra Ventricular Tachycardia  Rhythm:Regular Rate:Normal  Non STEMI 09/05/2019  Echo 6/27/201. The left ventricle has normal systolic function, with an ejection fraction of 55-60%. The cavity size was normal. Left ventricular diastolic Doppler parameters are consistent with pseudonormalization. No evidence of left ventricular regional wall  motion abnormalities.  2. The right ventricle has normal systolic function. The cavity was  normal. There is no increase in right ventricular wall thickness.  3. Left atrial size was mildly dilated.  4. Right atrial size was mildly dilated.  5. No evidence of mitral valve stenosis. Trivial mitral regurgitation.  6. The aortic valve is tricuspid. Mild calcification of the aortic valve. No stenosis of the aortic valve.  7. The aortic root is normal in size and structure.  8. Normal IVC size with PA systolic pressure 36 mmHg.    Neuro/Psych PSYCHIATRIC DISORDERS CVA, No Residual Symptoms    GI/Hepatic negative GI ROS, Neg liver ROS,   Endo/Other  negative endocrine ROS  Renal/GU Renal disease   Hx/o Bladder calculi    Musculoskeletal  (+) Arthritis , Osteoarthritis,  Incarcerated left inguinal hernia   Abdominal   Peds   Hematology  (+) anemia , Eliquis therapy- last doe 02/01/20   Anesthesia Other Findings   Reproductive/Obstetrics                            Anesthesia Physical Anesthesia Plan  ASA: III  Anesthesia Plan: General   Post-op Pain Management:    Induction: Intravenous  PONV Risk Score and Plan: 3 and Ondansetron, Treatment may vary due to age or medical condition and Dexamethasone  Airway Management Planned: LMA  Additional Equipment:   Intra-op Plan:   Post-operative Plan: Extubation in OR  Informed Consent: I have reviewed the patients History and Physical, chart, labs and discussed the procedure including the risks, benefits and alternatives for the proposed anesthesia with the patient or authorized representative who has indicated his/her understanding and acceptance.     Dental advisory given  Plan Discussed with: CRNA and Surgeon  Anesthesia Plan Comments:         Anesthesia Quick Evaluation

## 2020-02-02 NOTE — Discharge Instructions (Signed)
RESUME ELIQUIS (BLOOD THINNER) ON Friday April 2  [facility], [place]  UMBILICAL OR INGUINAL HERNIA REPAIR: POST OP INSTRUCTIONS  Always review your discharge instruction sheet given to you by the facility where your surgery was performed. IF YOU HAVE DISABILITY OR FAMILY LEAVE FORMS, YOU MUST BRING THEM TO THE OFFICE FOR PROCESSING.   DO NOT GIVE THEM TO YOUR DOCTOR.  1. A  prescription for pain medication may be given to you upon discharge.  Take your pain medication as prescribed, if needed.  If narcotic pain medicine is not needed, then you may take acetaminophen (Tylenol) or ibuprofen (Advil) as needed. 2. Take your usually prescribed medications unless otherwise directed. 3. If you need a refill on your pain medication, please contact your pharmacy.  They will contact our office to request authorization. Prescriptions will not be filled after 5 pm or on week-ends. 4. You should follow a light diet the first 24 hours after arrival home, such as soup and crackers, etc.  Be sure to include lots of fluids daily.  Resume your normal diet the day after surgery. 5. Most patients will experience some swelling and bruising around the umbilicus or in the groin and scrotum.  Ice packs and reclining will help.  Swelling and bruising can take several days to resolve.  6. It is common to experience some constipation if taking pain medication after surgery.  Increasing fluid intake and taking a stool softener (such as Colace) will usually help or prevent this problem from occurring.  A mild laxative (Milk of Magnesia or Miralax) should be taken according to package directions if there are no bowel movements after 48 hours. 7.  If your surgeon used skin glue on the incision, you may shower in 24 hours.  The glue will flake off over the next 2-3 weeks.  Any sutures or staples will be removed at the office during your follow-up visit. 8. ACTIVITIES:  You may resume regular (light) daily activities  beginning the next day--such as daily self-care, walking, climbing stairs--gradually increasing activities as tolerated.  You may have sexual intercourse when it is comfortable.  Refrain from any heavy lifting or straining until approved by your doctor. a. You may drive when you are no longer taking prescription pain medication, you can comfortably wear a seatbelt, and you can safely maneuver your car and apply brakes. b. RETURN TO WORK: n/a 9. You should see your doctor in the office for a follow-up appointment approximately 2-3 weeks after your surgery.  Make sure that you call for this appointment within a day or two after you arrive home to insure a convenient appointment time. 10. OTHER INSTRUCTIONS: DO NOT LIFT/PUSH/PULL ANYTHING GREATER THAN 10 LBS FOR 1 MONTH    WHEN TO CALL YOUR DOCTOR: 1. Fever over 101.0 2. Inability to urinate 3. Nausea and/or vomiting 4. Extreme swelling or bruising 5. Continued bleeding from incision. 6. Increased pain, redness, or drainage from the incision  The clinic staff is available to answer your questions during regular business hours.  Please don't hesitate to call and ask to speak to one of the nurses for clinical concerns.  If you have a medical emergency, go to the nearest emergency room or call 911.  A surgeon from [facility] is always on call at the hospital   [address], [address], [place], [place]  [address] ?  [address] [address], [place], [place]   [address] [phone] ? [phone] ? FAX [phone]  Web site: [email or web address]  ........Marland Kitchen   Managing Your Pain After Surgery Without Opioids    Thank you for participating in our program to help patients manage their pain after surgery without opioids. This is part of our effort to provide you with the best care possible, without exposing you or your family to the risk that opioids pose.  What pain can I expect after surgery? You can expect to have some  pain after surgery. This is normal. The pain is typically worse the day after surgery, and quickly begins to get better. Many studies have found that many patients are able to manage their pain after surgery with Over-the-Counter (OTC) medications such as Tylenol and Motrin. If you have a condition that does not allow you to take Tylenol or Motrin, notify your surgical team.  How will I manage my pain? The best strategy for controlling your pain after surgery is around the clock pain control with Tylenol (acetaminophen) and Motrin (ibuprofen or Advil). Alternating these medications with each other allows you to maximize your pain control. In addition to Tylenol and Motrin, you can use heating pads or ice packs on your incisions to help reduce your pain.  How will I alternate your regular strength over-the-counter pain medication? You will take a dose of pain medication every three hours. ; Start by taking 650 mg of Tylenol (2 pills of 325 mg) ; 3 hours later take 600 mg of Motrin (3 pills of 200 mg) ; 3 hours after taking the Motrin take 650 mg of Tylenol ; 3 hours after that take 600 mg of Motrin.   - 1 -  See example - if your first dose of Tylenol is at 12:00 PM   12:00 PM Tylenol 650 mg (2 pills of 325 mg)  3:00 PM Motrin 600 mg (3 pills of 200 mg)  6:00 PM Tylenol 650 mg (2 pills of 325 mg)  9:00 PM Motrin 600 mg (3 pills of 200 mg)  Continue alternating every 3 hours   We recommend that you follow this schedule around-the-clock for at least 3 days after surgery, or until you feel that it is no longer needed. Use the table on the last page of this handout to keep track of the medications you are taking. Important: Do not take more than 3000mg  of Tylenol or 1800mg  of Motrin in a 24-hour period. Do not take ibuprofen/Motrin if you have a history of bleeding stomach ulcers, severe kidney disease, &/or actively taking a blood thinner  What if I still have pain? If you have pain that  is not controlled with the over-the-counter pain medications (Tylenol and Motrin or Advil) you might have what we call "breakthrough" pain. You will receive a prescription for a small amount of an opioid pain medication such as Oxycodone, Tramadol, or Tylenol with Codeine. Use these opioid pills in the first 24 hours after surgery if you have breakthrough pain. Do not take more than 1 pill every 4-6 hours.  If you still have uncontrolled pain after using all opioid pills, don't hesitate to call our staff using the number provided. We will help make sure you are managing your pain in the best way possible, and if necessary, we can provide a prescription for additional pain medication.   Day 1    Time  Name of Medication Number of pills taken  Amount of Acetaminophen  Pain Level   Comments  AM PM       AM PM  AM PM       AM PM       AM PM       AM PM       AM PM       AM PM       Total Daily amount of Acetaminophen Do not take more than  3,000 mg per day      Day 2    Time  Name of Medication Number of pills taken  Amount of Acetaminophen  Pain Level   Comments  AM PM       AM PM       AM PM       AM PM       AM PM       AM PM       AM PM       AM PM       Total Daily amount of Acetaminophen Do not take more than  3,000 mg per day      Day 3    Time  Name of Medication Number of pills taken  Amount of Acetaminophen  Pain Level   Comments  AM PM       AM PM       AM PM       AM PM          AM PM       AM PM       AM PM       AM PM       Total Daily amount of Acetaminophen Do not take more than  3,000 mg per day      Day 4    Time  Name of Medication Number of pills taken  Amount of Acetaminophen  Pain Level   Comments  AM PM       AM PM       AM PM       AM PM       AM PM       AM PM       AM PM       AM PM       Total Daily amount of Acetaminophen Do not take more than  3,000 mg per day      Day 5    Time  Name of  Medication Number of pills taken  Amount of Acetaminophen  Pain Level   Comments  AM PM       AM PM       AM PM       AM PM       AM PM       AM PM       AM PM       AM PM       Total Daily amount of Acetaminophen Do not take more than  3,000 mg per day       Day 6    Time  Name of Medication Number of pills taken  Amount of Acetaminophen  Pain Level  Comments  AM PM       AM PM       AM PM       AM PM       AM PM       AM PM       AM PM       AM PM       Total Daily amount  of Acetaminophen Do not take more than  3,000 mg per day      Day 7    Time  Name of Medication Number of pills taken  Amount of Acetaminophen  Pain Level   Comments  AM PM       AM PM       AM PM       AM PM       AM PM       AM PM       AM PM       AM PM       Total Daily amount of Acetaminophen Do not take more than  3,000 mg per day        For additional information about how and where to safely dispose of unused opioid medications - RoleLink.com.br  Disclaimer: This document contains information and/or instructional materials adapted from [place] for the typical patient with your condition. It does not replace medical advice from your health care provider because your experience may differ from that of the typical patient. Talk to your health care provider if you have any questions about this document, your condition or your treatment plan. Adapted from [place]

## 2020-02-03 ENCOUNTER — Encounter: Payer: Self-pay | Admitting: *Deleted

## 2020-04-01 ENCOUNTER — Ambulatory Visit: Payer: Medicare Other | Admitting: Cardiovascular Disease

## 2020-04-06 ENCOUNTER — Encounter: Payer: Self-pay | Admitting: Cardiology

## 2020-04-06 ENCOUNTER — Ambulatory Visit (INDEPENDENT_AMBULATORY_CARE_PROVIDER_SITE_OTHER): Payer: Medicare Other | Admitting: Cardiology

## 2020-04-06 ENCOUNTER — Other Ambulatory Visit: Payer: Self-pay

## 2020-04-06 ENCOUNTER — Telehealth: Payer: Self-pay | Admitting: Radiology

## 2020-04-06 VITALS — BP 140/80 | HR 73 | Temp 97.4°F | Ht 68.0 in | Wt 119.6 lb

## 2020-04-06 DIAGNOSIS — Z951 Presence of aortocoronary bypass graft: Secondary | ICD-10-CM

## 2020-04-06 DIAGNOSIS — E785 Hyperlipidemia, unspecified: Secondary | ICD-10-CM

## 2020-04-06 DIAGNOSIS — Z8673 Personal history of transient ischemic attack (TIA), and cerebral infarction without residual deficits: Secondary | ICD-10-CM | POA: Diagnosis not present

## 2020-04-06 DIAGNOSIS — Z720 Tobacco use: Secondary | ICD-10-CM

## 2020-04-06 DIAGNOSIS — J449 Chronic obstructive pulmonary disease, unspecified: Secondary | ICD-10-CM

## 2020-04-06 DIAGNOSIS — I480 Paroxysmal atrial fibrillation: Secondary | ICD-10-CM | POA: Diagnosis not present

## 2020-04-06 DIAGNOSIS — F1010 Alcohol abuse, uncomplicated: Secondary | ICD-10-CM

## 2020-04-06 DIAGNOSIS — I739 Peripheral vascular disease, unspecified: Secondary | ICD-10-CM

## 2020-04-06 DIAGNOSIS — I441 Atrioventricular block, second degree: Secondary | ICD-10-CM

## 2020-04-06 NOTE — Patient Instructions (Addendum)
Medication Instructions:  No changes *If you need a refill on your cardiac medications before your next appointment, please call your pharmacy*   Lab Work: Not needed   Testing/Procedures: Your physician has recommended that you wear a 14 DAY ZIO-PATCH monitor. The Zio patch cardiac monitor continuously records heart rhythm data for up to 14 days, this is for patients being evaluated for multiple types heart rhythms. For the first 24 hours post application, please avoid getting the Zio monitor wet in the shower or by excessive sweating during exercise. After that, feel free to carry on with regular activities. Keep soaps and lotions away from the ZIO XT Patch.  This will be mailed to you, please expect 7-10 days to receive.   [employer] location - [place], [address].         Follow-Up: At [facility], you and your health needs are our priority.  As part of our continuing mission to provide you with exceptional heart care, we have created designated Provider Care Teams.  These Care Teams include your primary Cardiologist (physician) and Advanced Practice Providers (APPs -  Physician Assistants and Nurse Practitioners) who all work together to provide you with the care you need, when you need it.  We recommend signing up for the patient portal called "MyChart".  Sign up information is provided on this After Visit Summary.  MyChart is used to connect with patients for Virtual Visits (Telemedicine).  Patients are able to view lab/test results, encounter notes, upcoming appointments, etc.  Non-urgent messages can be sent to your provider as well.   To learn more about what you can do with MyChart, go to [email or web address].    Your next appointment:   6 to 8  week(s)  The format for your next appointment:   In Person  Provider:   Sanda Klein, MD   Other Instructions    ZIO XT- Long Term Monitor Instructions   Your physician has requested you wear your ZIO patch  monitor__14_____days.   This is a single patch monitor.  [employer] supplies one patch monitor per enrollment.  Additional stickers are not available.   Please do not apply patch if you will be having a Nuclear Stress Test, Echocardiogram, Cardiac CT, MRI, or Chest Xray during the time frame you would be wearing the monitor. The patch cannot be worn during these tests.  You cannot remove and re-apply the ZIO XT patch monitor.   Your ZIO patch monitor will be sent USPS Priority mail from [facility] directly to your home address. The monitor may also be mailed to a PO BOX if home delivery is not available.   It may take 3-5 days to receive your monitor after you have been enrolled.   Once you have received you monitor, please review enclosed instructions.  Your monitor has already been registered assigning a specific monitor serial # to you.   Applying the monitor   Shave hair from upper left chest.   Hold abrader disc by orange tab.  Rub abrader in 40 strokes over left upper chest as indicated in your monitor instructions.   Clean area with 4 enclosed alcohol pads .  Use all pads to assure are is cleaned thoroughly.  Let dry.   Apply patch as indicated in monitor instructions.  Patch will be place under collarbone on left side of chest with arrow pointing upward.   Rub patch adhesive wings for 2 minutes.Remove white label marked "1".  Remove white label  marked "2".  Rub patch adhesive wings for 2 additional minutes.   While looking in a mirror, press and release button in center of patch.  A small green light will flash 3-4 times .  This will be your only indicator the monitor has been turned on.     Do not shower for the first 24 hours.  You may shower after the first 24 hours.   Press button if you feel a symptom. You will hear a small click.  Record Date, Time and Symptom in the Patient Log Book.   When you are ready to remove patch, follow instructions on last 2 pages of Patient  Log Book.  Stick patch monitor onto last page of Patient Log Book.   Place Patient Log Book in [place] box.  Use locking tab on box and tape box closed securely.  The [employer] and [employer] has [employer] on it.  Please place in mailbox as soon as possible.  Your physician should have your test results approximately 7 days after the monitor has been mailed back to [facility].   Call [facility] Customer Care at [phone] if you have questions regarding your ZIO XT patch monitor.  Call them immediately if you see an orange light blinking on your monitor.   If your monitor falls off in less than 4 days contact our Monitor department at [phone].  If your monitor becomes loose or falls off after 4 days call [employer] at [phone] for suggestions on securing your monitor.

## 2020-04-06 NOTE — Progress Notes (Addendum)
Cardiology Office Note:    Date:  04/06/2020   ID:  [name], DOB 20-Aug-1946, MRN [id number]  PCP:  Claiborne Rigg, NP  Cardiologist:  Thurmon Fair, MD Dr Allyson Sabal (PV) Electrophysiologist:  None   Referring MD: Claiborne Rigg, NP   CC:  Claudication and palpitations  History of Present Illness:    Dann Galicia is a 74 y.o. male with a hx of CAD who underwent bypass surgery x3 Oct 2019 with an LIMA to LAD, SVG to OM, SVG to RCA. Ultimately he recovered well.  He was seen in consult for AF with RVR in the setting of urosepsis 05/01/2019. He has had documented type Mobitz AVB.  He has claudication and in Nov 2020 PV angiogram revealed bilateral SFA, tibial, and popliteal occlusion.  Dr Allyson Sabal recommended medical Rx.  The patient continues to smoke and has had a history of ETOH abuse.   He is in the office today for follow up.  He complains of bilateral (though Rt > Lt) calf claudication at 1/2 block or less.  He also complains of palpitations- "skipped beats".   Past Medical History:  Diagnosis Date  . Atrial fibrillation (HCC)   . Bladder calculi   . Claudication (HCC)   . COPD (chronic obstructive pulmonary disease) (HCC)   . DOE (dyspnea on exertion)   . Elevated blood pressure reading    12-22-2018 reprots he hasnt had his BP meds since this past thursday   . History of bladder stone   . 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 History of urinary retention   . Hypertension   . Mobitz type 1 second degree atrioventricular block   . Myocardial infarction [facility]) 2019   non stemi  . Osteoarthritis   . PAD (peripheral artery disease) (HCC)   . Paroxysmal atrial tachycardia (HCC)   . Peripheral vascular disease (HCC)    last duplex 09-25-2016 bilateral ABI 0.6 and occulsion of  right popiteal posterior tibials and left peroneal  . Presence of suprapubic catheter  (HCC)   . Stroke (HCC)   . Wears glasses     Past Surgical History:  Procedure Laterality Date  . ABDOMINAL AORTOGRAM W/LOWER EXTREMITY Bilateral 09/14/2019   Procedure: ABDOMINAL AORTOGRAM W/LOWER EXTREMITY;  Surgeon: Runell Gess, MD;  Location: [facility] INVASIVE CV LAB;  Service: Cardiovascular;  Laterality: Bilateral;  . CORONARY ARTERY BYPASS GRAFT N/A 09/05/2018   Procedure: CORONARY ARTERY BYPASS GRAFTING (CABG) times 3 using left internal mammary artery and right greater saphenous vein harvested endoscopically.;  Surgeon: Kerin Perna, MD;  Location: [facility] OR;  Service: Open Heart Surgery;  Laterality: N/A;  . CYSTOSCOPY WITH LITHOLAPAXY N/A 07/15/2017   Procedure: OPEN CYSTOLITHOTOMY;  Surgeon: Crista Elliot, MD;  Location: [facility] ORS;  Service: Urology;  Laterality: N/A;  . INGUINAL HERNIA REPAIR Right 2011  . INGUINAL HERNIA REPAIR Left 02/02/2020   Procedure: OPEN REPAIR OF LEFT INCARCERATED INGUINAL HERNIA REPAIR WITH MESH;  Surgeon: Gaynelle Adu, MD;  Location: [facility] ORS;  Service: General;  Laterality: Left;  . INSERTION OF SUPRAPUBIC CATHETER N/A 07/15/2017   Procedure: INSERTION OF SUPRAPUBIC CATHETER;  Surgeon: Crista Elliot, MD;  Location: [facility] ORS;  Service: Urology;  Laterality: N/A;  . LEFT HEART CATH AND CORONARY ANGIOGRAPHY N/A 09/03/2018   Procedure: LEFT HEART CATH AND CORONARY ANGIOGRAPHY;  Surgeon: Iran Ouch, MD;  Location: [facility] INVASIVE CV LAB;  Service: Cardiovascular;  Laterality: N/A;  . LOOP RECORDER IMPLANT N/A 01/05/2015   Procedure: LOOP RECORDER IMPLANT;  Surgeon: Thurmon Fair, MD;  Location: [facility] CATH LAB;  Service: Cardiovascular;  Laterality: N/A;  . ORIF WRIST FRACTURE Left 03/20/2014   Procedure: OPEN REDUCTION INTERNAL FIXATION (ORIF) WRIST FRACTURE;  Surgeon: Dominica Severin, MD;  Location: [facility] OR;  Service: Orthopedics;  Laterality: Left;  . TEE WITHOUT CARDIOVERSION N/A 01/05/2015   Procedure: TRANSESOPHAGEAL ECHOCARDIOGRAM (TEE)/LOOP;  Surgeon: Thurmon Fair, MD;  Location: [facility] ENDOSCOPY;  Service: Cardiovascular;  Laterality: N/A; ef 60-65%, no LA or RA thrombus, trivial PR, mild TR  . TEE WITHOUT CARDIOVERSION N/A 09/05/2018   Procedure: TRANSESOPHAGEAL ECHOCARDIOGRAM (TEE);  Surgeon: Donata Clay, Theron Arista, MD;  Location: [facility] OR;  Service: Open Heart Surgery;  Laterality: N/A;  . TONSILLECTOMY  child  . TOTAL HIP ARTHROPLASTY Right 2000    Current Medications: Current Meds  Medication Sig  . apixaban (ELIQUIS) 5 MG TABS tablet Take 1 tablet (5 mg total) by mouth 2 (two) times daily.  Marland Kitchen atorvastatin (LIPITOR) 80 MG tablet Take 1 tablet (80 mg total) by mouth daily.  . carvedilol (COREG) 6.25 MG tablet Take 6.25 mg by mouth 2 (two) times daily with a meal.  . cilostazol (PLETAL) 50 MG tablet Take 1 tablet (50 mg total) by mouth 2 (two) times daily.  Marland Kitchen lisinopril (ZESTRIL) 2.5 MG tablet Take 1 tablet (2.5 mg total) by mouth daily.  Marland Kitchen oxyCODONE (OXY IR/ROXICODONE) 5 MG immediate release tablet Take 1 tablet (5 mg total) by mouth every 6 (six) hours as needed for severe pain.     Allergies:   Lactose intolerance (gi)   Social History   Socioeconomic History  . Marital status: Single    Spouse name: Not on file  . Number of children: 4  . Years of education: 9  . Highest education level: Not on file  Occupational History  . Occupation: retired    Comment: Holiday representative  Tobacco Use  . Smoking status: Current Every Day Smoker    Packs/day: 0.25    Years: 52.00    Pack years: 13.00    Types: Cigarettes  . Smokeless tobacco: Never Used  . Tobacco comment: 07-08-2018 pt is down to 1ppw from 1 ppd, 12-22-2018 about  half a pack per day   Substance and Sexual Activity  . Alcohol use: Yes    Alcohol/week: 0.0 standard drinks    Comment: 08/2018 ! drink 1 pint a day 3 times a week on average  . Drug use: No    Comment: history of cocaine ; last use 7 months ago   . Sexual activity: Not on file  Other Topics Concern  . Not on file   Social History Narrative   Single   Right handed   Caffeine use - sodas on weekends with alcohol   Social Determinants of Health   Financial Resource Strain:   . Difficulty of Paying Living Expenses:   Food Insecurity:   . Worried About Programme researcher, broadcasting/film/video in the Last Year:   . Barista in the Last Year:   Transportation Needs:   . Freight forwarder (Medical):   Marland Kitchen Lack of Transportation (Non-Medical):   Physical Activity:   . Days of Exercise per Week:   . Minutes of Exercise per Session:   Stress:   . Feeling of Stress :   Social Connections:   . Frequency of  Communication with Friends and Family:   . Frequency of Social Gatherings with Friends and Family:   . Attends Religious Services:   . Active Member of Clubs or Organizations:   . Attends Archivist Meetings:   Marland Kitchen Marital Status:      Family History: The patient's family history includes Heart attack in his sister.  ROS:   Please see the history of present illness.    The patient declines to get the COVID vaccine.   All other systems reviewed and are negative.  EKGs/Labs/Other Studies Reviewed:    The following studies were reviewed today: PV angiogram 09/14/2019- IMPRESSION: Mr. Ricke Hey has occluded popliteal and tibial vessels with 0 vessel runoff bilaterally.  There are no endovascular options.  He is claudicant and does not have critical limb ischemia.  Medical therapy will be recommended.    EKG:  EKG is ordered today.  The ekg ordered today demonstrates Mobitz 1 AVB  whith VR 57.   Recent Labs: 05/02/2019: TSH 0.714 05/05/2019: Magnesium 2.1 01/29/2020: ALT 11; BUN 12; Creatinine, Ser 1.07; Hemoglobin 12.4; Platelets 231; Potassium 4.4; Sodium 141  Recent Lipid Panel    Component Value Date/Time   CHOL 173 03/27/2018 1306   TRIG 49 03/27/2018 1306   HDL 76 03/27/2018 1306   CHOLHDL 2.3 03/27/2018 1306   CHOLHDL 3.9 01/04/2015 0855   VLDL 16 01/04/2015 0855   LDLCALC 87  03/27/2018 1306    Physical Exam:    VS:  BP 140/80   Pulse 73   Temp (!) 97.4 F (36.3 C)   Ht 5\' 8"  (1.727 m)   Wt 119 lb 9.6 oz (54.3 kg)   SpO2 96%   BMI 18.19 kg/m     Wt Readings from Last 3 Encounters:  04/06/20 119 lb 9.6 oz (54.3 kg)  02/02/20 115 lb (52.2 kg)  01/29/20 115 lb (52.2 kg)     GEN: cachectic AA male, well developed in no acute distress HEENT: Normal NECK: No JVD LYMPHATICS: No lymphadenopathy CARDIAC: RRR, no murmurs, rubs, gallops RESPIRATORY:  Clear to auscultation without rales, wheezing or rhonchi  ABDOMEN: Soft, non-tender, non-distended MUSCULOSKELETAL:  No edema; both LE warm to touch. No distal pulses palpable.  No obvious wounds noted. No deformity  SKIN: Warm and dry NEUROLOGIC:  Alert and oriented x 3 PSYCHIATRIC:  Normal affect   ASSESSMENT:    Claudication in peripheral vascular disease (HCC) Pt complains of bilateral calf claudication at 1/2 block or less.   Mobitz type 1 second degree AV block Pt complains of palpitations  Tobacco abuse Continues to smoke  ETOH- Daily use  PLAN:    He had a loop recorder implanted in 2016- no further recording recommended at his last check in 2019.  I'll check with the device clinic to see if this is still active- if not check two week ZIO- ? Does he need a pacemaker.  I'll ask Dr Gwenlyn Found about possible options for claudication.  The patient is compliant with medication but continues to smoke and drink ETOH daily.    Medication Adjustments/Labs and Tests Ordered: Current medicines are reviewed at length with the patient today.  Concerns regarding medicines are outlined above.  No orders of the defined types were placed in this encounter.  No orders of the defined types were placed in this encounter.   There are no Patient Instructions on file for this visit.   Angelena Form, PA-C  04/06/2020 2:46 PM    [facility]  Addendum: Reviewed with Dr Allyson Sabal-  unfortunately no interventional options.  Plan is medical Rx which he is on.   Corine Shelter PA-C 04/07/2020 9:23 AM

## 2020-04-06 NOTE — Assessment & Plan Note (Signed)
Continues to smoke.

## 2020-04-06 NOTE — Telephone Encounter (Signed)
Enrolled patient for a 14 day Zio monitor to be mailed to patients home.  

## 2020-04-06 NOTE — Assessment & Plan Note (Signed)
Pt complains of palpitations

## 2020-04-06 NOTE — Assessment & Plan Note (Signed)
Pt complains of bilateral calf claudication at 1/2 block or less.

## 2020-04-25 ENCOUNTER — Telehealth: Payer: Self-pay | Admitting: Cardiovascular Disease

## 2020-04-25 MED ORDER — APIXABAN 5 MG PO TABS: 5.0000 mg | ORAL_TABLET | Freq: Two times a day (BID) | ORAL | 1 refills | Status: DC

## 2020-04-25 NOTE — Telephone Encounter (Signed)
Pt c/o BP issue: STAT if pt c/o blurred vision, one-sided weakness or slurred speech  1. What are your last 5 BP readings?  180/80  2. Are you having any other symptoms (ex. Dizziness, headache, blurred vision, passed out)? Chest pain with exertion per Clydie Braun  3. What is your BP issue? Clydie Braun with [facility] is calling to report BP reading.

## 2020-04-25 NOTE — Telephone Encounter (Signed)
Spoke to Clydie Braun who is a Patent examiner. She report today was her first initial appointment with pt and found that pt is not currently taking all medication as ordered. She report pt BP today was 180/80 and she sent in a new prescription for lisinopril 5mg  daily. She also report pt has been complaining of chest pain with exertion but denies current pain. in requesting an appointment to further assess.  Nurse contacted pt and he reconfirmed that he is not experiencing active chest pain. Pt voiced he is not currently taking medication because he doesn't have refills. Nurse advised pt to contact pharmacy as he should have enough refills but will send a message to Pharm D for eliquis refill. Appointment scheduled for 6/29 for further evaluations of chest pain. Nurse advised pt to seek emergent care if symptoms reoccur prior to appointment. Pt verbalized understanding.

## 2020-04-25 NOTE — Telephone Encounter (Signed)
74 M 54.3 kg, SCr 1.07. dose appropriate at 5 mg bid.  Refill x 6 months done

## 2020-04-29 ENCOUNTER — Encounter (HOSPITAL_COMMUNITY): Payer: Self-pay | Admitting: Emergency Medicine

## 2020-04-29 ENCOUNTER — Emergency Department (HOSPITAL_COMMUNITY)
Admission: EM | Admit: 2020-04-29 | Discharge: 2020-04-29 | Disposition: A | Discharge status: Home / Self Care | Payer: Medicare Other | Attending: Emergency Medicine | Admitting: Emergency Medicine

## 2020-04-29 ENCOUNTER — Other Ambulatory Visit: Payer: Self-pay

## 2020-04-29 DIAGNOSIS — R339 Retention of urine, unspecified: Secondary | ICD-10-CM | POA: Insufficient documentation

## 2020-04-29 DIAGNOSIS — I214 Non-ST elevation (NSTEMI) myocardial infarction: Secondary | ICD-10-CM | POA: Insufficient documentation

## 2020-04-29 DIAGNOSIS — I10 Essential (primary) hypertension: Secondary | ICD-10-CM | POA: Diagnosis not present

## 2020-04-29 DIAGNOSIS — Z7901 Long term (current) use of anticoagulants: Secondary | ICD-10-CM | POA: Insufficient documentation

## 2020-04-29 DIAGNOSIS — Z79899 Other long term (current) drug therapy: Secondary | ICD-10-CM | POA: Diagnosis not present

## 2020-04-29 DIAGNOSIS — F17210 Nicotine dependence, cigarettes, uncomplicated: Secondary | ICD-10-CM | POA: Insufficient documentation

## 2020-04-29 DIAGNOSIS — T83198A Other mechanical complication of other urinary devices and implants, initial encounter: Secondary | ICD-10-CM | POA: Diagnosis not present

## 2020-04-29 DIAGNOSIS — T83010A Breakdown (mechanical) of cystostomy catheter, initial encounter: Secondary | ICD-10-CM

## 2020-04-29 DIAGNOSIS — R338 Other retention of urine: Secondary | ICD-10-CM

## 2020-04-29 LAB — BASIC METABOLIC PANEL
Anion gap: 12 (ref 5–15)
BUN: 12 mg/dL (ref 8–23)
CO2: 24 mmol/L (ref 22–32)
Calcium: 9.9 mg/dL (ref 8.9–10.3)
Chloride: 104 mmol/L (ref 98–111)
Creatinine, Ser: 0.93 mg/dL (ref 0.61–1.24)
GFR calc Af Amer: >60 mL/min (ref >60)
GFR calc non Af Amer: >60 mL/min (ref >60)
Glucose, Bld: 99 mg/dL (ref 70–99)
Potassium: 4.9 mmol/L (ref 3.5–5.1)
Sodium: 140 mmol/L (ref 135–145)

## 2020-04-29 LAB — CBC WITH DIFFERENTIAL/PLATELET
Abs Immature Granulocytes: 0.03 10*3/uL (ref 0.00–0.07)
Basophils Absolute: 0.1 10*3/uL (ref 0.0–0.1)
Basophils Relative: 1 %
Eosinophils Absolute: 0.1 10*3/uL (ref 0.0–0.5)
Eosinophils Relative: 1 %
HCT: 37.9 % — ABNORMAL LOW (ref 39.0–52.0)
Hemoglobin: 12.5 g/dL — ABNORMAL LOW (ref 13.0–17.0)
Immature Granulocytes: 1 %
Lymphocytes Relative: 18 %
Lymphs Abs: 1.1 10*3/uL (ref 0.7–4.0)
MCH: 34.9 pg — ABNORMAL HIGH (ref 26.0–34.0)
MCHC: 33 g/dL (ref 30.0–36.0)
MCV: 105.9 fL — ABNORMAL HIGH (ref 80.0–100.0)
Monocytes Absolute: 0.7 10*3/uL (ref 0.1–1.0)
Monocytes Relative: 10 %
Neutro Abs: 4.6 10*3/uL (ref 1.7–7.7)
Neutrophils Relative %: 69 %
Platelets: 172 10*3/uL (ref 150–400)
RBC: 3.58 MIL/uL — ABNORMAL LOW (ref 4.22–5.81)
RDW: 14.6 % (ref 11.5–15.5)
WBC: 6.5 10*3/uL (ref 4.0–10.5)
nRBC: 0 % (ref 0.0–0.2)

## 2020-04-29 LAB — URINALYSIS, ROUTINE W REFLEX MICROSCOPIC
Bilirubin Urine: NEGATIVE
Glucose, UA: NEGATIVE mg/dL
Ketones, ur: NEGATIVE mg/dL
Nitrite: POSITIVE — AB
Protein, ur: 100 mg/dL — AB
RBC / HPF: >50 RBC/hpf — ABNORMAL HIGH (ref 0–5)
Specific Gravity, Urine: 1.013 (ref 1.005–1.030)
WBC, UA: >50 WBC/hpf — ABNORMAL HIGH (ref 0–5)
pH: 6 (ref 5.0–8.0)

## 2020-04-29 MED ORDER — CEPHALEXIN 500 MG PO CAPS: 500.0000 mg | ORAL_CAPSULE | Freq: Four times a day (QID) | ORAL | 0 refills | Status: AC

## 2020-04-29 MED ORDER — CEPHALEXIN 500 MG PO CAPS
500.0000 mg | ORAL_CAPSULE | Freq: Once | ORAL | Status: AC
  Administered 2020-04-29: 500 mg via ORAL
  Filled 2020-04-29: qty 1

## 2020-04-29 NOTE — ED Triage Notes (Signed)
When pt woke up the AM he noticed his suprapubic cath was clogged / not draining. Reports a hx of same. Reports burning around site as well. Denies changes in urine color/ odors.

## 2020-04-29 NOTE — ED Provider Notes (Signed)
[facility] DEPT Provider Note   CSN: [id number] Arrival date & time: 04/29/20  1455     History Chief Complaint  Patient presents with  . clogged suprapubic cath    Joe Durham is a 74 y.o. male.  He has a history of bladder outlet obstruction and suprapubic catheter.  He is also on chronic anticoagulation.  He said he woke up this morning with no urine in his leg bag.  Complaining of abdominal pain at the site of the tube.  No recent fevers chills nausea vomiting.  History of obstruction of the tube needing replacement.  The history is provided by the patient.  Male GU Problem Presenting symptoms: dysuria   Context: spontaneously   Relieved by:  Nothing Worsened by:  Nothing Ineffective treatments:  None tried Associated symptoms: abdominal pain and urinary retention   Associated symptoms: no fever   Abdominal pain:    Location:  Suprapubic   Quality: aching     Severity:  Severe   Onset quality:  Gradual   Timing:  Constant   Progression:  Worsening   Chronicity:  Recurrent Risk factors: urinary catheter   Abdominal Pain Associated symptoms: dysuria   Associated symptoms: no chest pain, no fever, no shortness of breath and no sore throat        Past Medical History:  Diagnosis Date  . Atrial fibrillation ([place])   . Bladder calculi   . Claudication ([place])   . COPD (chronic obstructive pulmonary disease) ([place])   . DOE (dyspnea on exertion)   . Elevated blood pressure reading    12-22-2018 reprots he hasnt had his BP meds since this past thursday   . History of bladder stone   . History of embolic stroke 33/8250   right PCA emobolism ischemic infarct (cyptogenic)  s/p LOOP recorder 01-05-2015;  07-08-2018 per pt no residual  . History of urinary retention   . Hypertension   . Mobitz type 1 second degree atrioventricular block   . Myocardial infarction [facility]) 2019   non stemi  . Osteoarthritis   . PAD (peripheral artery disease)  ([place])   . Paroxysmal atrial tachycardia ([place])   . Peripheral vascular disease ([place])    last duplex 09-25-2016 bilateral ABI 0.6 and occulsion of  right popiteal posterior tibials and left peroneal  . Presence of suprapubic catheter ([place])   . Stroke ([place])   . Wears glasses     Patient Active Problem List   Diagnosis Date Noted  . ETOH abuse 04/06/2020  . Claudication in peripheral vascular disease ([place]) 09/14/2019  . Incarcerated left inguinal hernia 08/16/2019  . Long term current use of anticoagulant therapy 06/02/2019  . COPD (chronic obstructive pulmonary disease) ([place]) 06/01/2019  . B12 deficiency 05/04/2019  . Left inguinal hernia 05/04/2019  . Cough   . Anemia   . Bacteremia due to Gram-negative bacteria 05/02/2019  . Sepsis ([place]) 05/01/2019  . PAF (paroxysmal atrial fibrillation) ([place]) 05/01/2019  . Pre-operative clearance 12/15/2018  . 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 NSTEMI (non-ST elevated myocardial infarction) ([place]) 09/03/2018  . Malnutrition of moderate degree 09/03/2018  . History of loop recorder 01/23/2018  . Mobitz type 1 second degree AV block 07/30/2017  . PAD (peripheral artery disease) ([place]) 07/30/2017  . Bladder calculi 07/15/2017  . UTI (urinary tract infection) 06/13/2017  . Acute cystitis with hematuria   . Gross hematuria 06/12/2017  . Renal insufficiency 06/12/2017  . Complicated UTI (urinary tract infection)  06/12/2017  . Acute pyelonephritis 06/12/2017  . Uncomplicated alcohol dependence (HCC)   . AKI (acute kidney injury) (HCC)   . Claudication (HCC) 11/14/2016  . Encounter for loop recorder check 07/19/2015  . First degree AV block 07/19/2015  . PAT (paroxysmal atrial tachycardia) (HCC) 07/19/2015  . Essential hypertension 04/05/2015  . Cocaine abuse (HCC) 04/05/2015  . Hospital discharge follow-up 01/10/2015  . 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 Lung mass   . Cerebral infarction due to  unspecified mechanism   . Visual changes   . Substance abuse (HCC)   . Dyslipidemia, goal LDL below 70   . History of stroke 01/03/2015  . Tobacco abuse 04/12/2014  . Bladder stones 04/12/2014    Past Surgical History:  Procedure Laterality Date  . ABDOMINAL AORTOGRAM W/LOWER EXTREMITY Bilateral 09/14/2019   Procedure: ABDOMINAL AORTOGRAM W/LOWER EXTREMITY;  Surgeon: Runell Gess, MD;  Location: [facility] INVASIVE CV LAB;  Service: Cardiovascular;  Laterality: Bilateral;  . CORONARY ARTERY BYPASS GRAFT N/A 09/05/2018   Procedure: CORONARY ARTERY BYPASS GRAFTING (CABG) times 3 using left internal mammary artery and right greater saphenous vein harvested endoscopically.;  Surgeon: Kerin Perna, MD;  Location: [facility] OR;  Service: Open Heart Surgery;  Laterality: N/A;  . CYSTOSCOPY WITH LITHOLAPAXY N/A 07/15/2017   Procedure: OPEN CYSTOLITHOTOMY;  Surgeon: Crista Elliot, MD;  Location: [facility] ORS;  Service: Urology;  Laterality: N/A;  . INGUINAL HERNIA REPAIR Right 2011  . INGUINAL HERNIA REPAIR Left 02/02/2020   Procedure: OPEN REPAIR OF LEFT INCARCERATED INGUINAL HERNIA REPAIR WITH MESH;  Surgeon: Gaynelle Adu, MD;  Location: [facility] ORS;  Service: General;  Laterality: Left;  . INSERTION OF SUPRAPUBIC CATHETER N/A 07/15/2017   Procedure: INSERTION OF SUPRAPUBIC CATHETER;  Surgeon: Crista Elliot, MD;  Location: [facility] ORS;  Service: Urology;  Laterality: N/A;  . LEFT HEART CATH AND CORONARY ANGIOGRAPHY N/A 09/03/2018   Procedure: LEFT HEART CATH AND CORONARY ANGIOGRAPHY;  Surgeon: Iran Ouch, MD;  Location: [facility] INVASIVE CV LAB;  Service: Cardiovascular;  Laterality: N/A;  . LOOP RECORDER IMPLANT N/A 01/05/2015   Procedure: LOOP RECORDER IMPLANT;  Surgeon: Thurmon Fair, MD;  Location: [facility] CATH LAB;  Service: Cardiovascular;  Laterality: N/A;  . ORIF WRIST FRACTURE Left 03/20/2014   Procedure: OPEN REDUCTION INTERNAL FIXATION (ORIF) WRIST FRACTURE;  Surgeon: Dominica Severin, MD;  Location: [facility] OR;   Service: Orthopedics;  Laterality: Left;  . TEE WITHOUT CARDIOVERSION N/A 01/05/2015   Procedure: TRANSESOPHAGEAL ECHOCARDIOGRAM (TEE)/LOOP;  Surgeon: Thurmon Fair, MD;  Location: [facility] ENDOSCOPY;  Service: Cardiovascular;  Laterality: N/A; ef 60-65%, no LA or RA thrombus, trivial PR, mild TR  . TEE WITHOUT CARDIOVERSION N/A 09/05/2018   Procedure: TRANSESOPHAGEAL ECHOCARDIOGRAM (TEE);  Surgeon: Donata Clay, Theron Arista, MD;  Location: [facility] OR;  Service: Open Heart Surgery;  Laterality: N/A;  . TONSILLECTOMY  child  . TOTAL HIP ARTHROPLASTY Right 2000       Family History  Problem Relation Age of Onset  . Heart attack Sister     Social History   Tobacco Use  . Smoking status: Current Every Day Smoker    Packs/day: 0.25    Years: 52.00    Pack years: 13.00    Types: Cigarettes  . Smokeless tobacco: Never Used  . Tobacco comment: 07-08-2018 pt is down to 1ppw from 1 ppd, 12-22-2018 about  half a pack per day   Vaping Use  . Vaping Use: Never used  Substance Use Topics  . Alcohol use: Yes    Alcohol/week: 0.0 standard drinks    Comment: 08/2018 ! drink 1 pint a day 3 times a week on average  . Drug use: No    Comment: history of cocaine ; last use 7 months ago     Home Medications Prior to Admission medications   Medication Sig Start Date End Date Taking? Authorizing Provider  apixaban (ELIQUIS) 5 MG TABS tablet Take 1 tablet (5 mg total) by mouth 2 (two) times daily. 04/25/20   Croitoru, Mihai, MD  atorvastatin (LIPITOR) 80 MG tablet Take 1 tablet (80 mg total) by mouth daily. 12/15/18   Abelino Derrick, PA-C  carvedilol (COREG) 6.25 MG tablet Take 6.25 mg by mouth 2 (two) times daily with a meal.    [provider]  cilostazol (PLETAL) 50 MG tablet Take 1 tablet (50 mg total) by mouth 2 (two) times daily. 09/15/19   Leone Brand, NP  lisinopril (ZESTRIL) 2.5 MG tablet Take 1 tablet (2.5 mg total) by mouth daily. 12/15/18   Abelino Derrick, PA-C  oxyCODONE (OXY IR/ROXICODONE) 5 MG  immediate release tablet Take 1 tablet (5 mg total) by mouth every 6 (six) hours as needed for severe pain. 02/02/20   Gaynelle Adu, MD    Allergies    Lactose intolerance (gi)  Review of Systems   Review of Systems  Constitutional: Negative for fever.  HENT: Negative for sore throat.   Eyes: Negative for visual disturbance.  Respiratory: Negative for shortness of breath.   Cardiovascular: Negative for chest pain.  Gastrointestinal: Positive for abdominal pain.  Genitourinary: Positive for dysuria.  Musculoskeletal: Negative for back pain.  Skin: Negative for rash.  Neurological: Negative for headaches.    Physical Exam Updated Vital Signs BP (!) 195/102 (BP Location: Right Arm)   Pulse 66   Temp 98 F (36.7 C) (Oral)   Resp (!) 23   Ht 5\' 8"  (1.727 m)   Wt 54.4 kg   SpO2 99%   BMI 18.25 kg/m   Physical Exam Vitals and nursing note reviewed.  Constitutional:      Appearance: He is well-developed.  HENT:     Head: Normocephalic and atraumatic.  Eyes:     Conjunctiva/sclera: Conjunctivae normal.  Cardiovascular:     Rate and Rhythm: Normal rate and regular rhythm.     Heart sounds: No murmur heard.   Pulmonary:     Effort: Pulmonary effort is normal. No respiratory distress.     Breath sounds: Normal breath sounds.  Abdominal:     Palpations: Abdomen is soft.     Tenderness: There is abdominal tenderness. There is no guarding or rebound.     Comments: He has bladder tenderness and tenderness where the suprapubic tube comes through his abdominal wall.  There is no surrounding erythema.  Musculoskeletal:        General: No deformity or signs of injury. Normal range of motion.     Cervical back: Neck supple.  Skin:    General: Skin is warm and dry.  Neurological:     General: No focal deficit present.     Mental Status: He is alert.     ED Results / Procedures / Treatments   Labs (all labs ordered are listed, but only abnormal results are displayed) Labs  Reviewed  URINALYSIS, ROUTINE W REFLEX MICROSCOPIC - Abnormal; Notable for the following components:      Result Value   APPearance CLOUDY (*)  Hgb urine dipstick LARGE (*)    Protein, ur 100 (*)    Nitrite POSITIVE (*)    Leukocytes,Ua MODERATE (*)    RBC / HPF >50 (*)    WBC, UA >50 (*)    Bacteria, UA MANY (*)    All other components within normal limits  CBC WITH DIFFERENTIAL/PLATELET - Abnormal; Notable for the following components:   RBC 3.58 (*)    Hemoglobin 12.5 (*)    HCT 37.9 (*)    MCV 105.9 (*)    MCH 34.9 (*)    All other components within normal limits  URINE CULTURE  BASIC METABOLIC PANEL    EKG None  Radiology No results found.  Procedures SUPRAPUBIC TUBE PLACEMENT  Date/Time: 04/29/2020 3:36 PM Performed by: Terrilee Files, MD Authorized by: Terrilee Files, MD   Consent:    Consent obtained:  Verbal   Consent given by:  Patient   Risks discussed:  Bleeding, infection and pain   Alternatives discussed:  No treatment and delayed treatment Anesthesia (see MAR for exact dosages):    Anesthesia method:  None Procedure details:    Complexity:  Simple   Catheter type:  Foley   Catheter size:  16 Fr   Ultrasound guidance: no     Number of attempts:  1   Urine characteristics:  Cloudy Post-procedure details:    Patient tolerance of procedure:  Tolerated well, no immediate complications   (including critical care time)  Medications Ordered in ED Medications  cephALEXin (KEFLEX) capsule 500 mg (500 mg Oral Given 04/29/20 1636)    ED Course  I have reviewed the triage vital signs and the nursing notes.  Pertinent labs & imaging results that were available during my care of the patient were reviewed by me and considered in my medical decision making (see chart for details).    MDM Rules/Calculators/A&P                         This patient complains of abdominal pain; this involves an extensive number of treatment Options and is a  complaint that carries with it a high risk of complications and Morbidity. The differential includes urinary retention, obstruction, perforation, urinary tract infection  I ordered, reviewed and interpreted labs, which included CBC with normal white count, stable hemoglobin, normal renal function, urinalysis with possible infection nitrite +50 whites. I ordered medication Oral Keflex Previous records obtained and reviewed in epic, has had Foley suprapubic catheter replacements done in the emergency department  After the interventions stated above, I reevaluated the patient and found patient symptoms to be resolved, having free-flowing urine.  Will cover with antibiotics for procedure and possible urinary tract infection.  Return instructions discussed.   Final Clinical Impression(s) / ED Diagnoses Final diagnoses:  Suprapubic catheter dysfunction, initial encounter (HCC)  Acute urinary retention    Rx / DC Orders ED Discharge Orders         Ordered    cephALEXin (KEFLEX) 500 MG capsule  4 times daily     Discontinue  Reprint     04/29/20 1630           Terrilee Files, MD 04/30/20 1140

## 2020-04-29 NOTE — Discharge Instructions (Addendum)
You were seen in the emergency department for a blocked catheter.  Your catheter was exchanged with improvement in your symptoms.  We are putting you on some antibiotics for possible urinary tract infection.  Please follow-up with your regular doctor and your urologist.  Return to the emergency department if any worsening or concerning symptoms

## 2020-05-01 LAB — URINE CULTURE: Culture: >=100000 — AB

## 2020-05-02 ENCOUNTER — Telehealth: Payer: Self-pay | Admitting: Emergency Medicine

## 2020-05-02 NOTE — Telephone Encounter (Signed)
Post ED Visit - Positive Culture Follow-up  Culture report reviewed by antimicrobial stewardship pharmacist: [name] []  , Pharm.D. []  Enzo Bi, Pharm.D., BCPS AQ-ID []  , Pharm.D., BCPS []  Celedonio Miyamoto, Pharm.D., BCPS []  [place], Garvin Fila.D., BCPS, AAHIVP []  , Pharm.D., BCPS, AAHIVP []  Georgina Pillion, PharmD, BCPS []  , PharmD, BCPS []  [place], PharmD, BCPS []  [address], PharmD []  , PharmD, BCPS []  Estella Husk, PharmD  Pharmacy Team []  Lysle Pearl, PharmD []  , PharmD []  Phillips Climes, PharmD []  , Rph []  Agapito Games) , PharmD []  Verlan Friends, PharmD []  , PharmD []  Mervyn Gay, PharmD []  , PharmD []  Vinnie Level, PharmD []  [facility], PharmD []  , PharmD []  Len Childs, PharmD   Positive urine culture Treated with cephalexin, organism sensitive to the same and no further patient follow-up is required at this time.  05/02/2020, 11:26 AM

## 2020-05-03 ENCOUNTER — Ambulatory Visit: Payer: Medicare Other | Admitting: Cardiology

## 2020-06-03 ENCOUNTER — Telehealth: Payer: Self-pay

## 2020-06-03 NOTE — Telephone Encounter (Signed)
[facility] brought to [facility] for [employer] FNP. Given to proivder.

## 2020-06-05 DEATH — deceased

## 2020-06-06 ENCOUNTER — Telehealth: Payer: Self-pay | Admitting: Nurse Practitioner

## 2020-06-06 NOTE — Telephone Encounter (Signed)
Please advise.  Copied from CRM [id number]. Topic: General - Inquiry >> Jun 03, 2020  3:34 PM Leary Roca wrote: Reason for CRM: Rudell Cobb from [facility] called to inform Bertram Denver that pt has passed on Jul 29, 2024and he is needing her to sign a death certificate .   Call back [phone]

## 2020-06-06 NOTE — Telephone Encounter (Signed)
Front desk informed the [facility] death certificate is ready for pick up.

## 2020-06-06 NOTE — Telephone Encounter (Signed)
[facility] called requesting Dr. Lillette Boxer signature for Patient Joe Durham death certificate by Today. Please Follow up.

## 2020-06-29 ENCOUNTER — Ambulatory Visit: Payer: Medicare Other | Admitting: Cardiovascular Disease

## 2020-09-12 IMAGING — CT CT RENAL STONE PROTOCOL
2 of 4 series · 14 of 46 positions shown, 16 images · non-contrast
Comparison: Prior CT from 06/11/2017.

CLINICAL DATA: Initial evaluation for hematuria, unknown cause.

EXAM:
CT ABDOMEN AND PELVIS WITHOUT CONTRAST
TECHNIQUE: Multidetector CT imaging of the abdomen and pelvis was performed
following the standard protocol without IV contrast.

[Series 2: axial st · axial · 0.68mm/px · z∈[+780,+1140]mm · 11 of 84 slices shown, 13 images]
[im 6/84  soft-tissue]
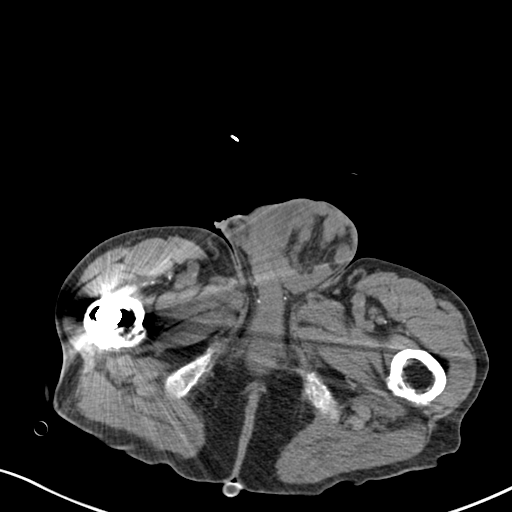
[im 6/84  bone]
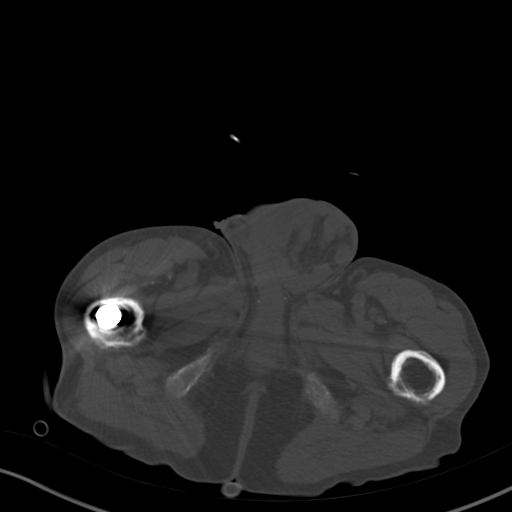
[im 16/84  soft-tissue]
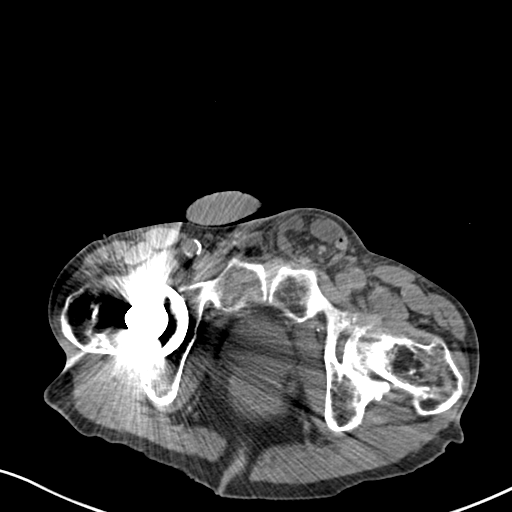
[im 21/84  soft-tissue]
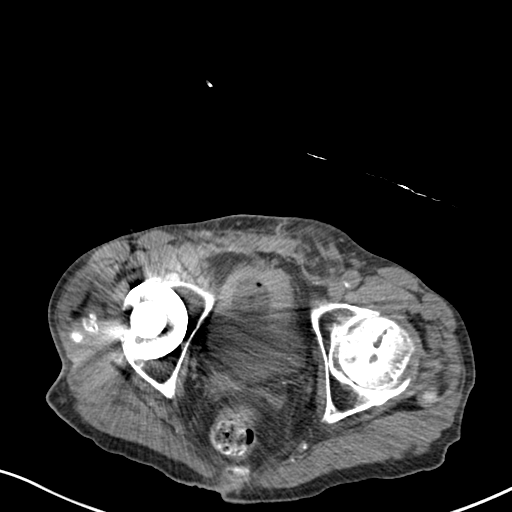
[im 26/84  soft-tissue]
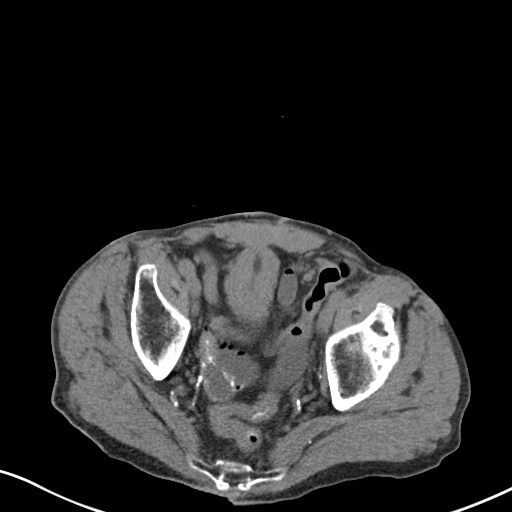
[im 37/84  soft-tissue]
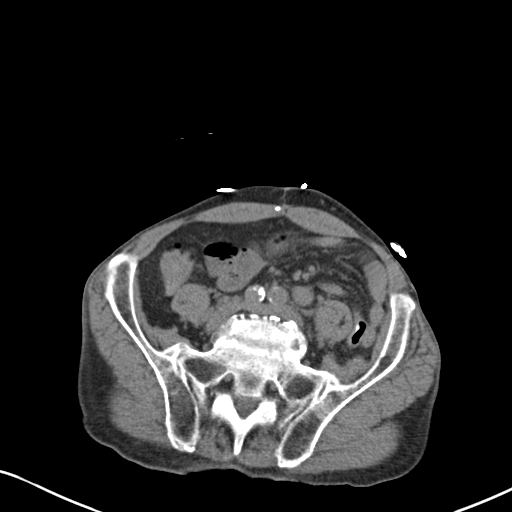
[im 42/84  soft-tissue]
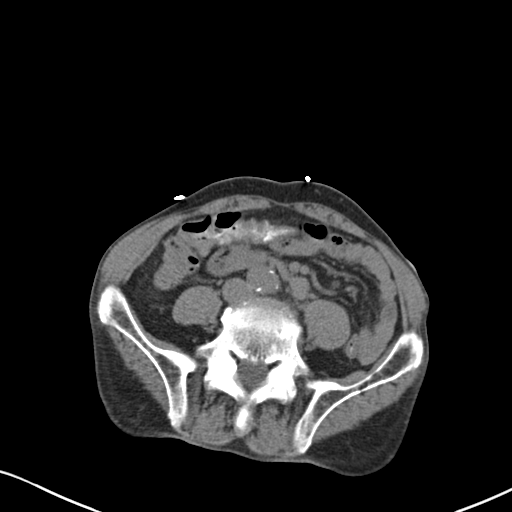
[im 47/84  soft-tissue]
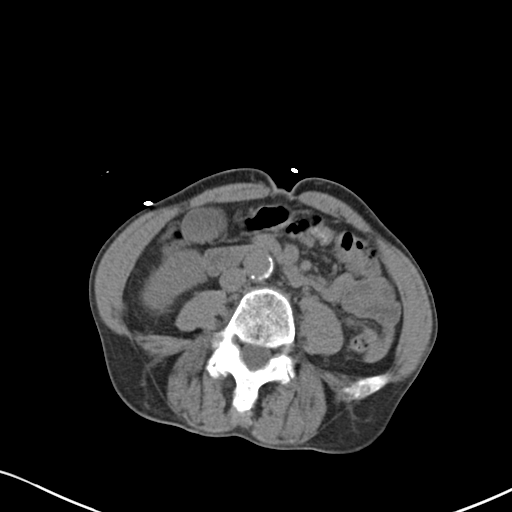
[im 58/84  soft-tissue]
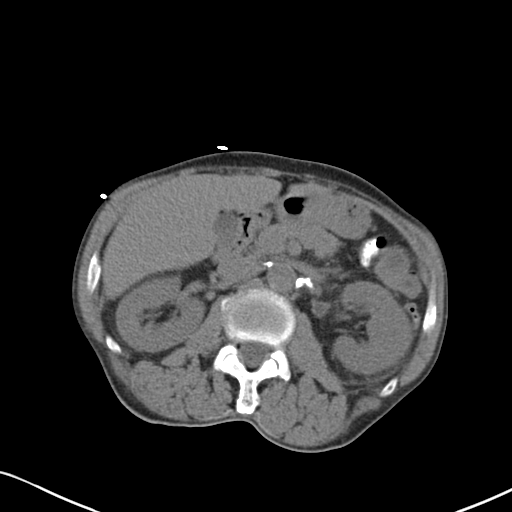
[im 63/84  soft-tissue]
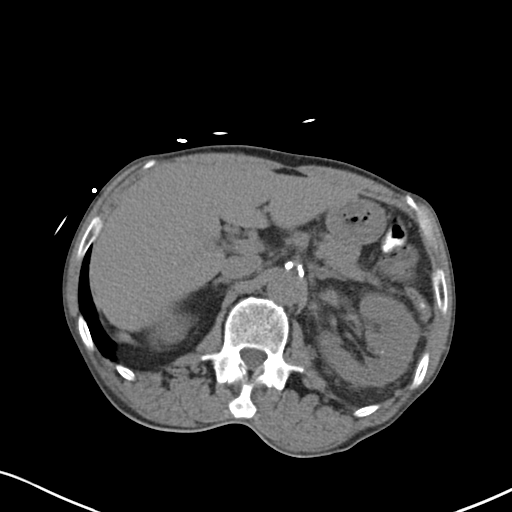
[im 63/84  bone]
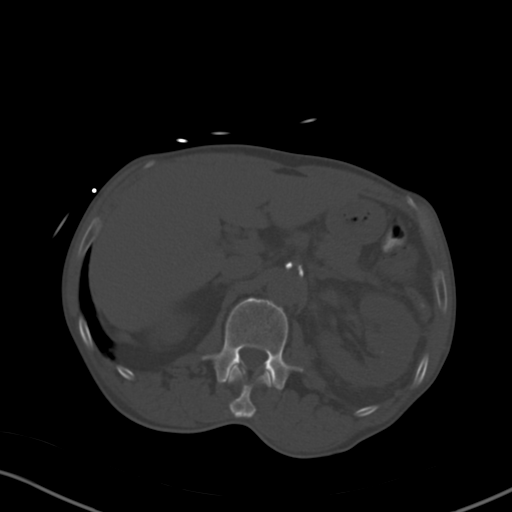
[im 68/84  soft-tissue]
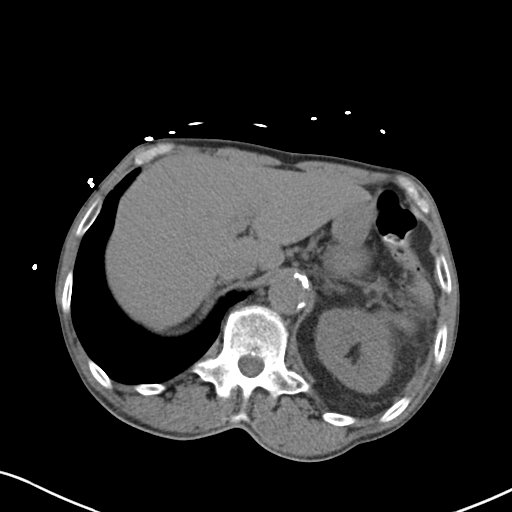
[im 78/84  soft-tissue]
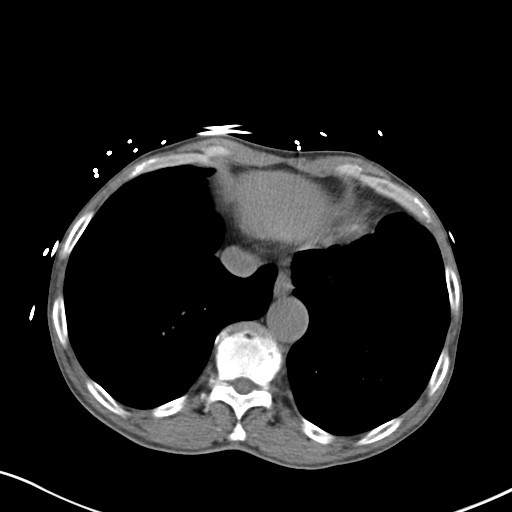

[Series 5: coronal · coronal · 0.71mm/px · 3 of 113 slices shown]
[im 38/113  soft-tissue]
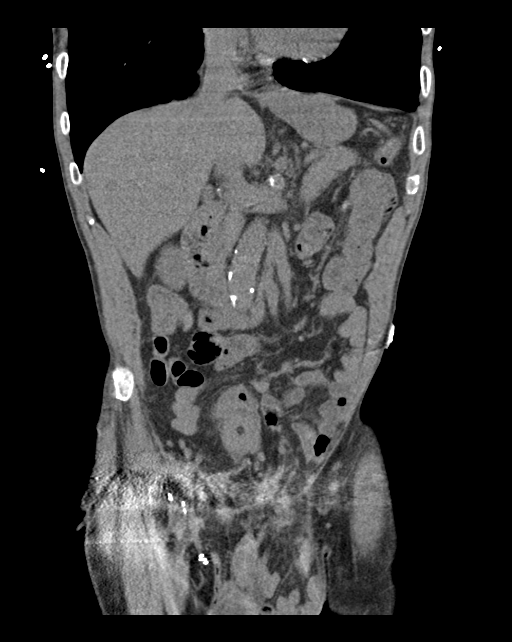
[im 50/113  soft-tissue]
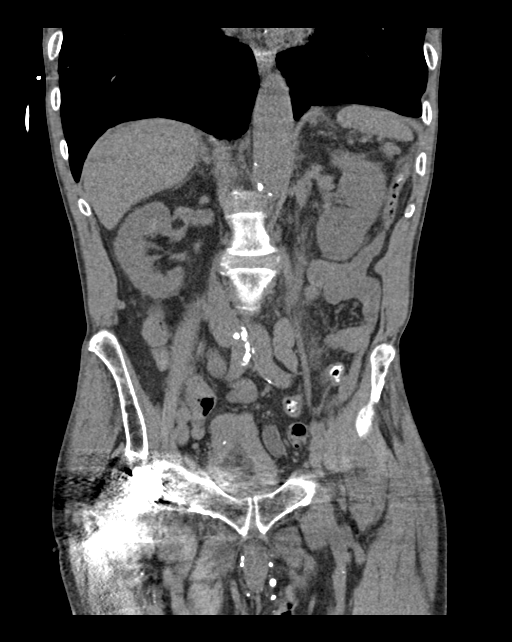
[im 63/113  soft-tissue]
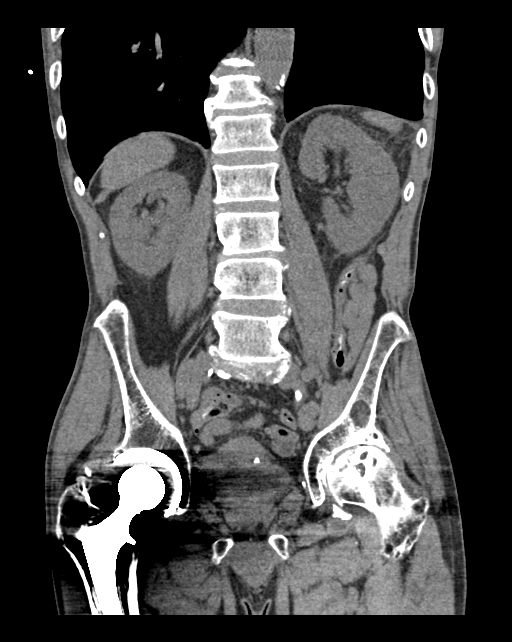

[14 of 46 positions shown; findings below may reference images not displayed]

FINDINGS: Lower chest: 15 x 9 mm nodule present at the central aspect of the
right lower lobe (series 4, image 8), indeterminate. Few additional
4 mm nodules within the subpleural right lower lung (series 4,
images 15, 23). Fat containing Bochdalek's type hernias noted at the
lung bases bilaterally. Mild scattered subsegmental atelectasis seen
dependently.

Hepatobiliary: Limited noncontrast evaluation liver is unremarkable.
Gallbladder within normal limits. No biliary dilatation.

Pancreas: Pancreas within normal limits.

Spleen: Spleen within normal limits.

Adrenals/Urinary Tract: Adrenal glands are normal.

Right kidney unremarkable without nephrolithiasis or hydronephrosis.
No radiopaque calculi seen along the course of the right renal
collecting system. No right-sided hydroureter.

On the left, there is mild left hydroureter without frank
hydronephrosis. Mild perinephric and periureteral fat stranding.
Punctate 3 mm nonobstructive stone within the interpolar left
kidney. No distally obstructive stone identified. Findings could
reflect sequelae of a recently passed stone or possibly infection.

Bladder decompressed with an indwelling suprapubic catheter in
place. Circumferential wall thickening likely related incomplete
distension and/or underlying neurogenic bladder. Scattered
calcifications noted within the bladder wall. Gas lucency within the
bladder lumen compatible with catheterization. Superimposed cystitis
could also be considered. No definite layering stones within the
decompressed bladder lumen.

Stomach/Bowel: Stomach decompressed without acute abnormality. No
evidence for bowel obstruction. Left inguinal hernia containing fat
and loops of small bowel present without associated obstruction or
inflammation. No acute inflammatory changes seen elsewhere about the
bowels.

Vascular/Lymphatic: Moderate aorto bi-iliac atherosclerotic disease.
No aneurysm. No appreciable adenopathy.

Reproductive: Prostate enlarged measuring 5 cm in transverse
diameter.

Other: No free air or fluid.

Musculoskeletal: No acute osseous abnormality. No discrete lytic or
blastic osseous lesions. Moderate to advanced osteoarthritic changes
noted about the left hip. Right total hip arthroplasty in place.
IMPRESSION: 1. Mild left hydroureteronephrosis with mild left perinephric and
periureteral fat stranding. No obstructive stone identified.
Findings could reflect sequelae of a recently passed stone or
possibly infection. Correlation with urinalysis recommended.
2. Punctate 3 mm nonobstructive left renal nephrolithiasis.
3. Suprapubic catheter in place with decompression of the urinary
bladder. Circumferential wall thickening at least in part related to
incomplete distension and/or underlying neurogenic bladder.
Superimposed cystitis could also be considered.
4. Small bowel containing left inguinal hernia without associated
obstruction or inflammation.
5. Moderate aorto bi-iliac atherosclerotic disease.
6. Enlarged prostate.
7. 15 x 9 mm right lower lobe nodule, indeterminate. Consider one of
the following in 3 months for both low-risk and high-risk
individuals: (a) repeat chest CT, () follow-up PET-CT, or () tissue
sampling. This recommendation follows the consensus statement:
Guidelines for Management of Incidental Pulmonary Nodules Detected

## 2020-09-12 IMAGING — DX CHEST  1 VIEW
1 series · 1 of 1 positions shown · non-contrast
Comparison: October 23, 2018

CLINICAL DATA: Shortness of breath and weakness

EXAM:
CHEST  1 VIEW

[chest ap]
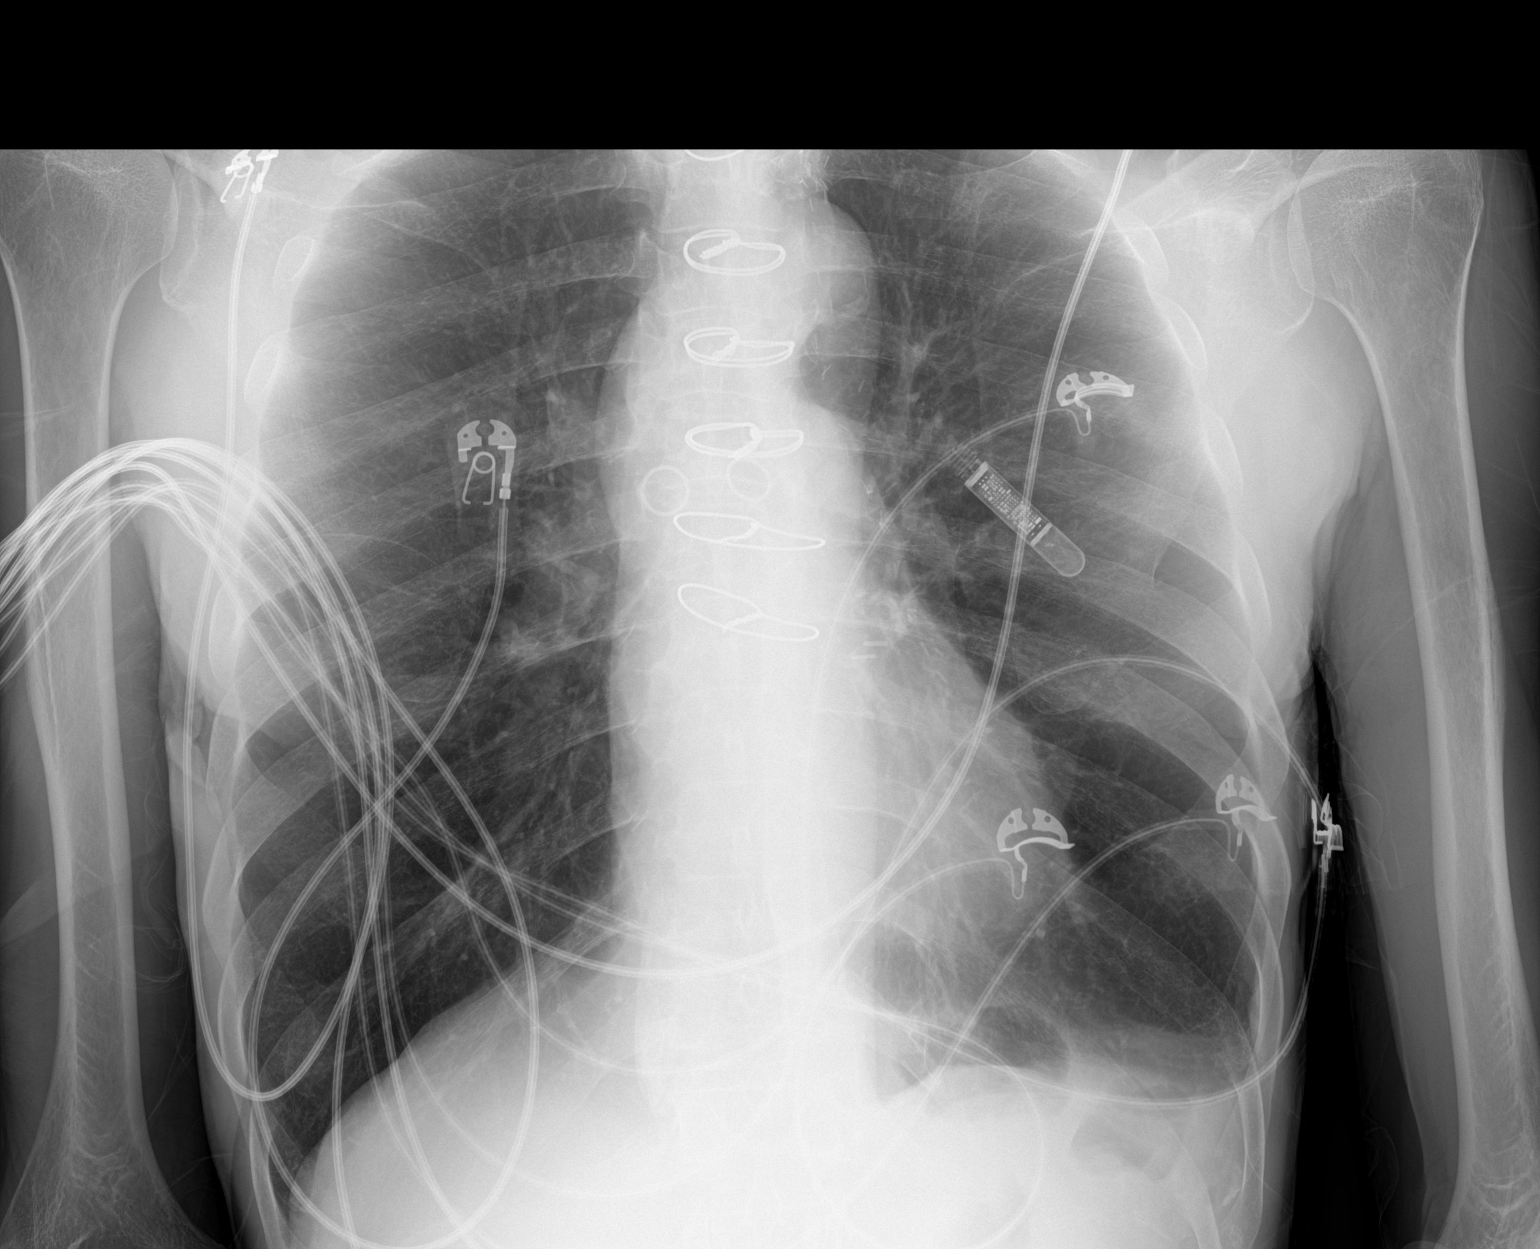

[1 of 1 positions shown; findings below may reference images not displayed]

FINDINGS: Lungs are hyperexpanded. There is mild scarring in the left base.
There is no edema or consolidation. Heart size and pulmonary
vascularity are normal. Patient is status post coronary artery
bypass grafting. There is a loop recorder on the left. There is
aortic atherosclerosis. No evident adenopathy. No pneumothorax. No
bone lesions.
IMPRESSION: Lungs hyperexpanded with mild scarring in the left base, stable. No
edema or consolidation. Stable cardiac silhouette. Postoperative
changes noted. Aortic Atherosclerosis ([id number].T).

## 2021-02-14 NOTE — Telephone Encounter (Signed)
Monitor was never returned and marked "lost" in [employer] system. Order will be cancelled.  

## 2021-07-16 ENCOUNTER — Other Ambulatory Visit: Payer: Self-pay | Admitting: Cardiovascular Disease

## 2021-07-17 NOTE — Telephone Encounter (Signed)
Prescription refill request for Eliquis received. Indication:atrial fib Last office visit:needs visit DTH:YHOOI labs Age: 75 Weight:54.4 kg  Prescription refilled
